# Patient Record
Sex: Male | Born: 1970 | Race: Black or African American | Hispanic: No | Marital: Single | Smoking: Never smoker
Health system: Southern US, Community
[De-identification: ages and names within clinical notes are randomized; demographics above are authoritative.]

## PROBLEM LIST (undated history)

## (undated) DIAGNOSIS — I214 Non-ST elevation (NSTEMI) myocardial infarction: Secondary | ICD-10-CM

## (undated) DIAGNOSIS — F319 Bipolar disorder, unspecified: Secondary | ICD-10-CM

## (undated) DIAGNOSIS — I82409 Acute embolism and thrombosis of unspecified deep veins of unspecified lower extremity: Secondary | ICD-10-CM

## (undated) DIAGNOSIS — F32A Depression, unspecified: Secondary | ICD-10-CM

## (undated) DIAGNOSIS — I10 Essential (primary) hypertension: Secondary | ICD-10-CM

## (undated) DIAGNOSIS — E7800 Pure hypercholesterolemia, unspecified: Secondary | ICD-10-CM

## (undated) DIAGNOSIS — F250 Schizoaffective disorder, bipolar type: Secondary | ICD-10-CM

## (undated) DIAGNOSIS — I2510 Atherosclerotic heart disease of native coronary artery without angina pectoris: Secondary | ICD-10-CM

## (undated) DIAGNOSIS — R011 Cardiac murmur, unspecified: Secondary | ICD-10-CM

## (undated) DIAGNOSIS — I219 Acute myocardial infarction, unspecified: Secondary | ICD-10-CM

## (undated) DIAGNOSIS — H109 Unspecified conjunctivitis: Secondary | ICD-10-CM

## (undated) DIAGNOSIS — M1990 Unspecified osteoarthritis, unspecified site: Secondary | ICD-10-CM

## (undated) DIAGNOSIS — B8789 Myiasis of other sites: Secondary | ICD-10-CM

## (undated) DIAGNOSIS — S86019A Strain of unspecified Achilles tendon, initial encounter: Secondary | ICD-10-CM

## (undated) DIAGNOSIS — F329 Major depressive disorder, single episode, unspecified: Secondary | ICD-10-CM

## (undated) DIAGNOSIS — F1410 Cocaine abuse, uncomplicated: Secondary | ICD-10-CM

## (undated) DIAGNOSIS — Z9861 Coronary angioplasty status: Secondary | ICD-10-CM

## (undated) DIAGNOSIS — R4182 Altered mental status, unspecified: Secondary | ICD-10-CM

## (undated) DIAGNOSIS — I509 Heart failure, unspecified: Secondary | ICD-10-CM

## (undated) DIAGNOSIS — M5430 Sciatica, unspecified side: Secondary | ICD-10-CM

---

## 1999-04-09 ENCOUNTER — Encounter: Admission: RE | Admit: 1999-04-09 | Discharge: 1999-04-09 | Payer: Self-pay | Admitting: Internal Medicine

## 1999-04-23 ENCOUNTER — Encounter: Admission: RE | Admit: 1999-04-23 | Discharge: 1999-04-23 | Payer: Self-pay | Admitting: Hematology and Oncology

## 1999-07-24 ENCOUNTER — Emergency Department (HOSPITAL_COMMUNITY): Admission: EM | Admit: 1999-07-24 | Discharge: 1999-07-24 | Payer: Self-pay | Admitting: Emergency Medicine

## 2000-03-07 ENCOUNTER — Emergency Department (HOSPITAL_COMMUNITY): Admission: EM | Admit: 2000-03-07 | Discharge: 2000-03-07 | Payer: Self-pay

## 2015-09-01 DIAGNOSIS — I82409 Acute embolism and thrombosis of unspecified deep veins of unspecified lower extremity: Secondary | ICD-10-CM

## 2015-09-01 HISTORY — DX: Acute embolism and thrombosis of unspecified deep veins of unspecified lower extremity: I82.409

## 2015-09-03 ENCOUNTER — Encounter (HOSPITAL_COMMUNITY): Payer: Self-pay | Admitting: *Deleted

## 2015-09-03 ENCOUNTER — Emergency Department (HOSPITAL_COMMUNITY)
Admission: EM | Admit: 2015-09-03 | Discharge: 2015-09-03 | Disposition: A | Discharge status: Home / Self Care | Payer: Self-pay | Attending: Dermatology | Admitting: Dermatology

## 2015-09-03 DIAGNOSIS — L0291 Cutaneous abscess, unspecified: Secondary | ICD-10-CM | POA: Insufficient documentation

## 2015-09-03 DIAGNOSIS — I10 Essential (primary) hypertension: Secondary | ICD-10-CM | POA: Insufficient documentation

## 2015-09-03 DIAGNOSIS — Z5321 Procedure and treatment not carried out due to patient leaving prior to being seen by health care provider: Secondary | ICD-10-CM | POA: Insufficient documentation

## 2015-09-03 HISTORY — DX: Essential (primary) hypertension: I10

## 2015-09-03 HISTORY — DX: Sciatica, unspecified side: M54.30

## 2015-09-03 HISTORY — DX: Unspecified osteoarthritis, unspecified site: M19.90

## 2015-09-03 NOTE — ED Triage Notes (Signed)
Pt states he is supposed to be taking blood pressure medicine but has been off of the medication for 3.5 years. States that he has been having vision issues since 2015 due to HTN. States he also wants to be seen for a boil he has near his groin.

## 2015-09-03 NOTE — ED Notes (Signed)
Patient and patients visitor visualized leaving ER.

## 2015-09-03 NOTE — ED Triage Notes (Signed)
Pt became upset during triage questions and stood up and removed himself from monitoring. When this RN went to get his temperature he stood up out of the chair and drank water. Pt states he came in to get a check up not get asked so many questions. Explained triage process.

## 2015-09-19 ENCOUNTER — Emergency Department (HOSPITAL_COMMUNITY): Payer: Self-pay | Admitting: Anesthesiology

## 2015-09-19 ENCOUNTER — Emergency Department (HOSPITAL_COMMUNITY): Payer: Self-pay

## 2015-09-19 ENCOUNTER — Encounter (HOSPITAL_COMMUNITY)
Admission: EM | Disposition: A | Discharge status: Other Acute Inpatient Hospital | Payer: Self-pay | Source: Home / Self Care | Attending: Orthopedic Surgery

## 2015-09-19 ENCOUNTER — Inpatient Hospital Stay (HOSPITAL_COMMUNITY)
Admission: EM | Admit: 2015-09-19 | Discharge: 2015-10-02 | DRG: 581 | Payer: Self-pay | Attending: Orthopedic Surgery | Admitting: Orthopedic Surgery

## 2015-09-19 ENCOUNTER — Encounter (HOSPITAL_COMMUNITY): Payer: Self-pay | Admitting: *Deleted

## 2015-09-19 DIAGNOSIS — I10 Essential (primary) hypertension: Secondary | ICD-10-CM | POA: Diagnosis present

## 2015-09-19 DIAGNOSIS — Z91013 Allergy to seafood: Secondary | ICD-10-CM

## 2015-09-19 DIAGNOSIS — S86019A Strain of unspecified Achilles tendon, initial encounter: Secondary | ICD-10-CM

## 2015-09-19 DIAGNOSIS — M1990 Unspecified osteoarthritis, unspecified site: Secondary | ICD-10-CM | POA: Diagnosis present

## 2015-09-19 DIAGNOSIS — R402252 Coma scale, best verbal response, oriented, at arrival to emergency department: Secondary | ICD-10-CM | POA: Diagnosis present

## 2015-09-19 DIAGNOSIS — Z590 Homelessness: Secondary | ICD-10-CM

## 2015-09-19 DIAGNOSIS — R402142 Coma scale, eyes open, spontaneous, at arrival to emergency department: Secondary | ICD-10-CM | POA: Diagnosis present

## 2015-09-19 DIAGNOSIS — S91011A Laceration without foreign body, right ankle, initial encounter: Principal | ICD-10-CM | POA: Diagnosis present

## 2015-09-19 DIAGNOSIS — S86021A Laceration of right Achilles tendon, initial encounter: Secondary | ICD-10-CM | POA: Diagnosis present

## 2015-09-19 DIAGNOSIS — IMO0002 Reserved for concepts with insufficient information to code with codable children: Secondary | ICD-10-CM

## 2015-09-19 DIAGNOSIS — W268XXA Contact with other sharp object(s), not elsewhere classified, initial encounter: Secondary | ICD-10-CM

## 2015-09-19 DIAGNOSIS — Y9259 Other trade areas as the place of occurrence of the external cause: Secondary | ICD-10-CM

## 2015-09-19 DIAGNOSIS — S86011A Strain of right Achilles tendon, initial encounter: Secondary | ICD-10-CM

## 2015-09-19 DIAGNOSIS — M5430 Sciatica, unspecified side: Secondary | ICD-10-CM | POA: Diagnosis present

## 2015-09-19 DIAGNOSIS — R402362 Coma scale, best motor response, obeys commands, at arrival to emergency department: Secondary | ICD-10-CM | POA: Diagnosis present

## 2015-09-19 DIAGNOSIS — Z791 Long term (current) use of non-steroidal anti-inflammatories (NSAID): Secondary | ICD-10-CM

## 2015-09-19 HISTORY — DX: Strain of unspecified achilles tendon, initial encounter: S86.019A

## 2015-09-19 HISTORY — PX: I & D EXTREMITY: SHX5045

## 2015-09-19 LAB — CBC WITH DIFFERENTIAL/PLATELET
BASOS ABS: 0 10*3/uL (ref 0.0–0.1)
BASOS PCT: 0 %
EOS PCT: 1 %
Eosinophils Absolute: 0.1 10*3/uL (ref 0.0–0.7)
HCT: 41 % (ref 39.0–52.0)
Hemoglobin: 13.4 g/dL (ref 13.0–17.0)
LYMPHS PCT: 20 %
Lymphs Abs: 1.6 10*3/uL (ref 0.7–4.0)
MCH: 29.1 pg (ref 26.0–34.0)
MCHC: 32.7 g/dL (ref 30.0–36.0)
MCV: 88.9 fL (ref 78.0–100.0)
Monocytes Absolute: 0.3 10*3/uL (ref 0.1–1.0)
Monocytes Relative: 4 %
NEUTROS ABS: 6.2 10*3/uL (ref 1.7–7.7)
Neutrophils Relative %: 75 %
Platelets: 269 10*3/uL (ref 150–400)
RBC: 4.61 MIL/uL (ref 4.22–5.81)
RDW: 14.2 % (ref 11.5–15.5)
WBC: 8.3 10*3/uL (ref 4.0–10.5)

## 2015-09-19 LAB — ETHANOL: Alcohol, Ethyl (B): <5 mg/dL (ref <5)

## 2015-09-19 LAB — BASIC METABOLIC PANEL
ANION GAP: 5 (ref 5–15)
BUN: 18 mg/dL (ref 6–20)
CALCIUM: 9.1 mg/dL (ref 8.9–10.3)
CHLORIDE: 106 mmol/L (ref 101–111)
CO2: 28 mmol/L (ref 22–32)
Creatinine, Ser: 1.3 mg/dL — ABNORMAL HIGH (ref 0.61–1.24)
GFR calc Af Amer: >60 mL/min (ref >60)
GLUCOSE: 120 mg/dL — AB (ref 65–99)
POTASSIUM: 3.6 mmol/L (ref 3.5–5.1)
Sodium: 139 mmol/L (ref 135–145)

## 2015-09-19 SURGERY — IRRIGATION AND DEBRIDEMENT EXTREMITY
Anesthesia: General | Site: Ankle | Laterality: Right

## 2015-09-19 MED ORDER — CEFAZOLIN SODIUM-DEXTROSE 2-4 GM/100ML-% IV SOLN
2.0000 g | Freq: Four times a day (QID) | INTRAVENOUS | Status: AC
  Administered 2015-09-19 – 2015-09-21 (×6): 2 g via INTRAVENOUS
  Filled 2015-09-19 (×6): qty 100

## 2015-09-19 MED ORDER — METOCLOPRAMIDE HCL 5 MG PO TABS: 5.0000 mg | ORAL_TABLET | Freq: Three times a day (TID) | ORAL | Status: DC | PRN

## 2015-09-19 MED ORDER — LACTATED RINGERS IV SOLN
INTRAVENOUS | Status: DC | PRN
  Administered 2015-09-19 (×2): via INTRAVENOUS

## 2015-09-19 MED ORDER — VANCOMYCIN HCL 500 MG IV SOLR
INTRAVENOUS | Status: DC | PRN
  Administered 2015-09-19: 500 mg via TOPICAL

## 2015-09-19 MED ORDER — 0.9 % SODIUM CHLORIDE (POUR BTL) OPTIME
TOPICAL | Status: DC | PRN
  Administered 2015-09-19: 1000 mL
  Administered 2015-09-19 (×2): 2000 mL

## 2015-09-19 MED ORDER — SUCCINYLCHOLINE CHLORIDE 200 MG/10ML IV SOSY
PREFILLED_SYRINGE | INTRAVENOUS | Status: AC
  Filled 2015-09-19: qty 30

## 2015-09-19 MED ORDER — METHOCARBAMOL 1000 MG/10ML IJ SOLN
500.0000 mg | Freq: Four times a day (QID) | INTRAVENOUS | Status: DC | PRN
  Filled 2015-09-19: qty 5

## 2015-09-19 MED ORDER — PROPOFOL 10 MG/ML IV BOLUS
INTRAVENOUS | Status: DC | PRN
  Administered 2015-09-19: 40 mg via INTRAVENOUS
  Administered 2015-09-19: 300 mg via INTRAVENOUS

## 2015-09-19 MED ORDER — OXYCODONE HCL 5 MG PO TABS
5.0000 mg | ORAL_TABLET | ORAL | Status: DC | PRN
  Administered 2015-09-19 – 2015-10-02 (×80): 10 mg via ORAL
  Filled 2015-09-19 (×82): qty 2

## 2015-09-19 MED ORDER — SODIUM CHLORIDE 0.9 % IJ SOLN
INTRAMUSCULAR | Status: AC
  Filled 2015-09-19: qty 20

## 2015-09-19 MED ORDER — PROPOFOL 10 MG/ML IV BOLUS
INTRAVENOUS | Status: AC
  Filled 2015-09-19: qty 20

## 2015-09-19 MED ORDER — CEFAZOLIN SODIUM 1 G IJ SOLR
INTRAMUSCULAR | Status: AC
  Filled 2015-09-19: qty 30

## 2015-09-19 MED ORDER — PROMETHAZINE HCL 25 MG/ML IJ SOLN: 6.2500 mg | INTRAMUSCULAR | Status: DC | PRN

## 2015-09-19 MED ORDER — PHENYLEPHRINE HCL 10 MG/ML IJ SOLN
INTRAMUSCULAR | Status: DC | PRN
  Administered 2015-09-19: 80 ug via INTRAVENOUS

## 2015-09-19 MED ORDER — HYDROMORPHONE HCL 1 MG/ML IJ SOLN
0.2500 mg | INTRAMUSCULAR | Status: DC | PRN
  Administered 2015-09-19 (×2): 0.5 mg via INTRAVENOUS

## 2015-09-19 MED ORDER — EPHEDRINE SULFATE 50 MG/ML IJ SOLN
INTRAMUSCULAR | Status: AC
  Filled 2015-09-19: qty 2

## 2015-09-19 MED ORDER — ONDANSETRON HCL 4 MG/2ML IJ SOLN
4.0000 mg | Freq: Four times a day (QID) | INTRAMUSCULAR | Status: DC | PRN
  Administered 2015-09-20: 4 mg via INTRAVENOUS
  Filled 2015-09-19: qty 2

## 2015-09-19 MED ORDER — PHENYLEPHRINE 40 MCG/ML (10ML) SYRINGE FOR IV PUSH (FOR BLOOD PRESSURE SUPPORT)
PREFILLED_SYRINGE | INTRAVENOUS | Status: AC
  Filled 2015-09-19: qty 10

## 2015-09-19 MED ORDER — METOCLOPRAMIDE HCL 5 MG/ML IJ SOLN: 5.0000 mg | Freq: Three times a day (TID) | INTRAMUSCULAR | Status: DC | PRN

## 2015-09-19 MED ORDER — TETANUS-DIPHTH-ACELL PERTUSSIS 5-2.5-18.5 LF-MCG/0.5 IM SUSP
0.5000 mL | Freq: Once | INTRAMUSCULAR | Status: AC
  Administered 2015-09-19: 0.5 mL via INTRAMUSCULAR
  Filled 2015-09-19: qty 0.5

## 2015-09-19 MED ORDER — ACETAMINOPHEN 650 MG RE SUPP: 650.0000 mg | Freq: Four times a day (QID) | RECTAL | Status: DC | PRN

## 2015-09-19 MED ORDER — ASPIRIN 325 MG PO TABS
325.0000 mg | ORAL_TABLET | Freq: Every day | ORAL | Status: DC
  Administered 2015-09-20 – 2015-09-21 (×2): 325 mg via ORAL
  Filled 2015-09-19 (×2): qty 1

## 2015-09-19 MED ORDER — POTASSIUM CHLORIDE IN NACL 20-0.9 MEQ/L-% IV SOLN
INTRAVENOUS | Status: AC
  Administered 2015-09-19: 22:00:00 via INTRAVENOUS
  Filled 2015-09-19 (×2): qty 1000

## 2015-09-19 MED ORDER — CEFAZOLIN SODIUM-DEXTROSE 2-3 GM-% IV SOLR
INTRAVENOUS | Status: DC | PRN
  Administered 2015-09-19: 2 g via INTRAVENOUS

## 2015-09-19 MED ORDER — METHOCARBAMOL 500 MG PO TABS
500.0000 mg | ORAL_TABLET | Freq: Four times a day (QID) | ORAL | Status: DC | PRN
  Administered 2015-09-19 – 2015-10-02 (×37): 500 mg via ORAL
  Filled 2015-09-19 (×39): qty 1

## 2015-09-19 MED ORDER — DICLOFENAC SODIUM 75 MG PO TBEC
75.0000 mg | DELAYED_RELEASE_TABLET | Freq: Two times a day (BID) | ORAL | Status: DC
  Administered 2015-09-20: 75 mg via ORAL
  Filled 2015-09-19 (×2): qty 1

## 2015-09-19 MED ORDER — LIDOCAINE 2% (20 MG/ML) 5 ML SYRINGE
INTRAMUSCULAR | Status: AC
  Filled 2015-09-19: qty 10

## 2015-09-19 MED ORDER — ROCURONIUM BROMIDE 10 MG/ML (PF) SYRINGE
PREFILLED_SYRINGE | INTRAVENOUS | Status: AC
  Filled 2015-09-19: qty 20

## 2015-09-19 MED ORDER — FENTANYL CITRATE (PF) 100 MCG/2ML IJ SOLN
INTRAMUSCULAR | Status: AC
  Filled 2015-09-19: qty 2

## 2015-09-19 MED ORDER — SUCCINYLCHOLINE CHLORIDE 20 MG/ML IJ SOLN
INTRAMUSCULAR | Status: DC | PRN
  Administered 2015-09-19: 120 mg via INTRAVENOUS

## 2015-09-19 MED ORDER — LIDOCAINE HCL (CARDIAC) 20 MG/ML IV SOLN
INTRAVENOUS | Status: DC | PRN
  Administered 2015-09-19: 100 mg via INTRATRACHEAL

## 2015-09-19 MED ORDER — ACETAMINOPHEN 325 MG PO TABS
650.0000 mg | ORAL_TABLET | Freq: Four times a day (QID) | ORAL | Status: DC | PRN
  Administered 2015-09-19 – 2015-10-01 (×10): 650 mg via ORAL
  Filled 2015-09-19 (×11): qty 2

## 2015-09-19 MED ORDER — DOCUSATE SODIUM 100 MG PO CAPS
100.0000 mg | ORAL_CAPSULE | Freq: Two times a day (BID) | ORAL | Status: DC
  Administered 2015-09-19 – 2015-10-01 (×4): 100 mg via ORAL
  Filled 2015-09-19 (×18): qty 1

## 2015-09-19 MED ORDER — ONDANSETRON HCL 4 MG/2ML IJ SOLN
INTRAMUSCULAR | Status: AC
  Filled 2015-09-19: qty 4

## 2015-09-19 MED ORDER — FENTANYL CITRATE (PF) 250 MCG/5ML IJ SOLN
INTRAMUSCULAR | Status: DC | PRN
  Administered 2015-09-19: 100 ug via INTRAVENOUS

## 2015-09-19 MED ORDER — ONDANSETRON HCL 4 MG PO TABS: 4.0000 mg | ORAL_TABLET | Freq: Four times a day (QID) | ORAL | Status: DC | PRN

## 2015-09-19 MED ORDER — EPHEDRINE SULFATE 50 MG/ML IJ SOLN
INTRAMUSCULAR | Status: DC | PRN
  Administered 2015-09-19 (×2): 10 mg via INTRAVENOUS

## 2015-09-19 MED ORDER — FENTANYL CITRATE (PF) 100 MCG/2ML IJ SOLN
50.0000 ug | Freq: Once | INTRAMUSCULAR | Status: AC
  Administered 2015-09-19: 50 ug via INTRAVENOUS
  Filled 2015-09-19: qty 2

## 2015-09-19 MED ORDER — MEPERIDINE HCL 25 MG/ML IJ SOLN: 6.2500 mg | INTRAMUSCULAR | Status: DC | PRN

## 2015-09-19 MED ORDER — HYDROMORPHONE HCL 1 MG/ML IJ SOLN
INTRAMUSCULAR | Status: AC
  Filled 2015-09-19: qty 1

## 2015-09-19 MED ORDER — SODIUM CHLORIDE 0.9 % IV BOLUS (SEPSIS)
500.0000 mL | Freq: Once | INTRAVENOUS | Status: AC
  Administered 2015-09-19: 500 mL via INTRAVENOUS

## 2015-09-19 MED ORDER — ARTIFICIAL TEARS OP OINT
TOPICAL_OINTMENT | OPHTHALMIC | Status: AC
  Filled 2015-09-19: qty 7

## 2015-09-19 MED ORDER — VANCOMYCIN HCL 500 MG IV SOLR
INTRAVENOUS | Status: AC
  Filled 2015-09-19: qty 500

## 2015-09-19 MED ORDER — CEFAZOLIN SODIUM-DEXTROSE 2-4 GM/100ML-% IV SOLN
2.0000 g | Freq: Once | INTRAVENOUS | Status: AC
  Administered 2015-09-19: 2 g via INTRAVENOUS
  Filled 2015-09-19: qty 100

## 2015-09-19 SURGICAL SUPPLY — 65 items
BANDAGE ACE 4X5 VEL STRL LF (GAUZE/BANDAGES/DRESSINGS) ×2 IMPLANT
BANDAGE ACE 6X5 VEL STRL LF (GAUZE/BANDAGES/DRESSINGS) ×2 IMPLANT
BANDAGE ELASTIC 4 VELCRO ST LF (GAUZE/BANDAGES/DRESSINGS) IMPLANT
BNDG COHESIVE 4X5 TAN STRL (GAUZE/BANDAGES/DRESSINGS) IMPLANT
BNDG GAUZE ELAST 4 BULKY (GAUZE/BANDAGES/DRESSINGS) ×2 IMPLANT
CANISTER WOUND CARE 500ML ATS (WOUND CARE) ×2 IMPLANT
COVER SURGICAL LIGHT HANDLE (MISCELLANEOUS) ×3 IMPLANT
CUFF TOURNIQUET SINGLE 18IN (TOURNIQUET CUFF) ×1 IMPLANT
CUFF TOURNIQUET SINGLE 24IN (TOURNIQUET CUFF) IMPLANT
CUFF TOURNIQUET SINGLE 34IN LL (TOURNIQUET CUFF) IMPLANT
CUFF TOURNIQUET SINGLE 44IN (TOURNIQUET CUFF) IMPLANT
DRAPE U-SHAPE 47X51 STRL (DRAPES) ×3 IMPLANT
DRSG PAD ABDOMINAL 8X10 ST (GAUZE/BANDAGES/DRESSINGS) IMPLANT
DRSG VAC ATS SM SENSATRAC (GAUZE/BANDAGES/DRESSINGS) ×2 IMPLANT
DURAPREP 26ML APPLICATOR (WOUND CARE) ×1 IMPLANT
ELECT REM PT RETURN 9FT ADLT (ELECTROSURGICAL) ×3
ELECTRODE REM PT RTRN 9FT ADLT (ELECTROSURGICAL) IMPLANT
FACESHIELD WRAPAROUND (MASK) ×3 IMPLANT
FACESHIELD WRAPAROUND OR TEAM (MASK) ×1 IMPLANT
GAUZE SPONGE 4X4 12PLY STRL (GAUZE/BANDAGES/DRESSINGS) ×2 IMPLANT
GAUZE XEROFORM 5X9 LF (GAUZE/BANDAGES/DRESSINGS) IMPLANT
GLOVE BIOGEL PI IND STRL 8 (GLOVE) ×1 IMPLANT
GLOVE BIOGEL PI INDICATOR 8 (GLOVE) ×2
GLOVE SURG ORTHO 8.0 STRL STRW (GLOVE) ×3 IMPLANT
GOWN STRL REUS W/ TWL LRG LVL3 (GOWN DISPOSABLE) ×2 IMPLANT
GOWN STRL REUS W/ TWL XL LVL3 (GOWN DISPOSABLE) ×1 IMPLANT
GOWN STRL REUS W/TWL LRG LVL3 (GOWN DISPOSABLE) ×6
GOWN STRL REUS W/TWL XL LVL3 (GOWN DISPOSABLE) ×3
HANDPIECE INTERPULSE COAX TIP (DISPOSABLE)
KIT BASIN OR (CUSTOM PROCEDURE TRAY) ×3 IMPLANT
KIT PREVENA INCISION MGT20CM45 (CANNISTER) ×2 IMPLANT
KIT ROOM TURNOVER OR (KITS) ×3 IMPLANT
MANIFOLD NEPTUNE II (INSTRUMENTS) ×3 IMPLANT
NDL SUT 6 .5 CRC .975X.05 MAYO (NEEDLE) IMPLANT
NEEDLE MAYO TAPER (NEEDLE) ×3
NS IRRIG 1000ML POUR BTL (IV SOLUTION) ×3 IMPLANT
PACK ORTHO EXTREMITY (CUSTOM PROCEDURE TRAY) ×3 IMPLANT
PAD ARMBOARD 7.5X6 YLW CONV (MISCELLANEOUS) ×6 IMPLANT
PAD CAST 4YDX4 CTTN HI CHSV (CAST SUPPLIES) IMPLANT
PADDING CAST COTTON 4X4 STRL (CAST SUPPLIES) ×3
PADDING CAST COTTON 6X4 STRL (CAST SUPPLIES) ×2 IMPLANT
SET HNDPC FAN SPRY TIP SCT (DISPOSABLE) IMPLANT
SPLINT PLASTER CAST XFAST 5X30 (CAST SUPPLIES) IMPLANT
SPLINT PLASTER XFAST SET 5X30 (CAST SUPPLIES) ×2
SPONGE LAP 18X18 X RAY DECT (DISPOSABLE) ×5 IMPLANT
SPONGE LAP 4X18 X RAY DECT (DISPOSABLE) ×1 IMPLANT
STOCKINETTE IMPERVIOUS 9X36 MD (GAUZE/BANDAGES/DRESSINGS) ×1 IMPLANT
SUT ETHILON 2 0 FS 18 (SUTURE) IMPLANT
SUT ETHILON 3 0 PS 1 (SUTURE) ×6 IMPLANT
SUT ETHILON 4 0 PS 2 18 (SUTURE) IMPLANT
SUT PROLENE 3 0 PS 2 (SUTURE) IMPLANT
SUT VIC AB 1 CT1 27 (SUTURE) ×12
SUT VIC AB 1 CT1 27XBRD ANBCTR (SUTURE) IMPLANT
SUT VIC AB 2-0 CT1 27 (SUTURE) ×6
SUT VIC AB 2-0 CT1 TAPERPNT 27 (SUTURE) IMPLANT
SUT VIC AB 3-0 SH 27 (SUTURE) ×3
SUT VIC AB 3-0 SH 27X BRD (SUTURE) IMPLANT
TOWEL OR 17X24 6PK STRL BLUE (TOWEL DISPOSABLE) ×3 IMPLANT
TOWEL OR 17X26 10 PK STRL BLUE (TOWEL DISPOSABLE) ×3 IMPLANT
TUBE ANAEROBIC SPECIMEN COL (MISCELLANEOUS) IMPLANT
TUBE CONNECTING 12'X1/4 (SUCTIONS) ×1
TUBE CONNECTING 12X1/4 (SUCTIONS) ×2 IMPLANT
UNDERPAD 30X30 INCONTINENT (UNDERPADS AND DIAPERS) ×3 IMPLANT
WATER STERILE IRR 1000ML POUR (IV SOLUTION) ×3 IMPLANT
YANKAUER SUCT BULB TIP NO VENT (SUCTIONS) ×3 IMPLANT

## 2015-09-19 NOTE — Anesthesia Preprocedure Evaluation (Addendum)
Anesthesia Evaluation  Patient identified by MRN, date of birth, ID band Patient awake    Reviewed: Allergy & Precautions, NPO status , Patient's Chart, lab work & pertinent test results  Airway Mallampati: II  TM Distance: >3 FB Neck ROM: Full    Dental no notable dental hx. (+) Dental Advisory Given, Edentulous Upper   Pulmonary neg pulmonary ROS,    Pulmonary exam normal breath sounds clear to auscultation       Cardiovascular hypertension, negative cardio ROS Normal cardiovascular exam Rhythm:Regular Rate:Normal     Neuro/Psych negative neurological ROS  negative psych ROS   GI/Hepatic negative GI ROS, Neg liver ROS,   Endo/Other  negative endocrine ROS  Renal/GU negative Renal ROS  negative genitourinary   Musculoskeletal negative musculoskeletal ROS (+) Arthritis ,   Abdominal   Peds negative pediatric ROS (+)  Hematology negative hematology ROS (+)   Anesthesia Other Findings   Reproductive/Obstetrics negative OB ROS                            Anesthesia Physical Anesthesia Plan  ASA: II  Anesthesia Plan: General   Post-op Pain Management:    Induction: Intravenous  Airway Management Planned: Oral ETT  Additional Equipment:   Intra-op Plan:   Post-operative Plan: Extubation in OR  Informed Consent: I have reviewed the patients History and Physical, chart, labs and discussed the procedure including the risks, benefits and alternatives for the proposed anesthesia with the patient or authorized representative who has indicated his/her understanding and acceptance.   Dental advisory given  Plan Discussed with: CRNA, Anesthesiologist and Surgeon  Anesthesia Plan Comments:        Anesthesia Quick Evaluation

## 2015-09-19 NOTE — Brief Op Note (Signed)
09/19/2015  7:19 PM  PATIENT:  Oren Bineterrick Schueler  45 y.o. male  PRE-OPERATIVE DIAGNOSIS:  ankle laceration right  POST-OPERATIVE DIAGNOSIS:  ankle laceration right  PROCEDURE:  Procedure(s): IRRIGATION AND DEBRIDEMENT ANKLE LACERATIONS achilles TENDON REPAIR, laceration closure with wound vac placement  SURGEON:  Surgeon(s): Cammy CopaScott Deshawn Skelley, MD  ASSISTANT: none  ANESTHESIA:   general  EBL: 10 ml    Total I/O In: 200 [I.V.:200] Out: -   BLOOD ADMINISTERED: none  DRAINS: provena incisional vac   LOCAL MEDICATIONS USED:  none  SPECIMEN:  No Specimen  COUNTS:  YES  TOURNIQUET:  * No tourniquets in log *  DICTATION: .Other Dictation: Dictation Number [id number]  PLAN OF CARE: Admit to inpatient   PATIENT DISPOSITION:  PACU - hemodynamically stable

## 2015-09-19 NOTE — Transfer of Care (Signed)
Immediate Anesthesia Transfer of Care Note  Patient: Oren BinetDerrick Croom  Procedure(s) Performed: Procedure(s): IRRIGATION AND DEBRIDEMENT ANKLE LACERATIONS POSSIBLE TENDON REPAIR (Right)  Patient Location: PACU  Anesthesia Type:General  Level of Consciousness: sedated  Airway & Oxygen Therapy: Patient Spontanous Breathing and Patient connected to face mask oxygen  Post-op Assessment: Report given to RN and Post -op Vital signs reviewed and stable  Post vital signs: Reviewed and stable  Last Vitals:  Vitals:   09/19/15 1123 09/19/15 1337  BP: 128/78 136/94  Pulse: 79 85  Resp: 17 18  Temp: 37.2 C     Last Pain:  Vitals:   09/19/15 1245  TempSrc:   PainSc: 6          Complications: No apparent anesthesia complications

## 2015-09-19 NOTE — ED Triage Notes (Addendum)
Per EMS - patient from [facility] is [facility], with right medial heal laceration.  Lac does not appear to be through to bone.  This happened last night around 7-8 pm when he put his foot in a sink to wash it and the porcelain sink collapsed.  Patient has 4 inch lac to medial right heal.  Bleeding is controlled.  Vitals 150/100 (hx HTN), HR 80, RR 20, 95% RA.  Patient appears exceptionally sleepy on arrival and states he was drinking "yesterday."

## 2015-09-19 NOTE — H&P (Signed)
Brett Salinas is an 45 y.o. male.   Chief Complaint: Right foot pain HPI: Brett Salinas is a 45 year old patient who just out of prison.  He was living in a hotel when yesterday a porcelain sink fell on his right heel.  Sustained a laceration at that time.  Did not immediately seek medical attention for unclear reasons.  Presents now with exposed tendon and inability to ambulate.  There is no family history of DVT or pulmonary embolism  Past Medical History:  Diagnosis Date  . Arthritis   . Hypertension   . Sciatica     History reviewed. No pertinent surgical history.  History reviewed. No pertinent family history. Social History:  reports that he has never smoked. He has never used smokeless tobacco. He reports that he drinks alcohol. He reports that he does not use drugs.  Allergies:  Allergies  Allergen Reactions  . Catfish [Fish Allergy] Anaphylaxis    Medications Prior to Admission  Medication Sig Dispense Refill  . diclofenac (VOLTAREN) 75 MG EC tablet Take 75 mg by mouth 2 (two) times daily.    . naproxen sodium (ANAPROX) 220 MG tablet Take 440 mg by mouth 2 (two) times daily with a meal.      Results for orders placed or performed during the hospital encounter of 09/19/15 (from the past 48 hour(s))  Ethanol     Status: None   Collection Time: 09/19/15 12:16 PM  Result Value Ref Range   Alcohol, Ethyl (B) <5 <5 mg/dL    Comment:        LOWEST DETECTABLE LIMIT FOR SERUM ALCOHOL IS 5 mg/dL FOR MEDICAL PURPOSES ONLY   Basic metabolic panel     Status: Abnormal   Collection Time: 09/19/15 12:16 PM  Result Value Ref Range   Sodium 139 135 - 145 mmol/L   Potassium 3.6 3.5 - 5.1 mmol/L   Chloride 106 101 - 111 mmol/L   CO2 28 22 - 32 mmol/L   Glucose, Bld 120 (H) 65 - 99 mg/dL   BUN 18 6 - 20 mg/dL   Creatinine, Ser 1.30 (H) 0.61 - 1.24 mg/dL   Calcium 9.1 8.9 - 10.3 mg/dL   GFR calc non Af Amer >60 >60 mL/min   GFR calc Af Amer >60 >60 mL/min    Comment:  (NOTE) The eGFR has been calculated using the CKD EPI equation. This calculation has not been validated in all clinical situations. eGFR's persistently <60 mL/min signify possible Chronic Kidney Disease.    Anion gap 5 5 - 15  CBC with Differential     Status: None   Collection Time: 09/19/15 12:16 PM  Result Value Ref Range   WBC 8.3 4.0 - 10.5 K/uL   RBC 4.61 4.22 - 5.81 MIL/uL   Hemoglobin 13.4 13.0 - 17.0 g/dL   HCT 41.0 39.0 - 52.0 %   MCV 88.9 78.0 - 100.0 fL   MCH 29.1 26.0 - 34.0 pg   MCHC 32.7 30.0 - 36.0 g/dL   RDW 14.2 11.5 - 15.5 %   Platelets 269 150 - 400 K/uL   Neutrophils Relative % 75 %   Neutro Abs 6.2 1.7 - 7.7 K/uL   Lymphocytes Relative 20 %   Lymphs Abs 1.6 0.7 - 4.0 K/uL   Monocytes Relative 4 %   Monocytes Absolute 0.3 0.1 - 1.0 K/uL   Eosinophils Relative 1 %   Eosinophils Absolute 0.1 0.0 - 0.7 K/uL   Basophils Relative 0 %  Basophils Absolute 0.0 0.0 - 0.1 K/uL   Dg Chest 2 View  Result Date: 09/19/2015 CLINICAL DATA:  Preoperative evaluation for ankle region surgery. Hypertension. EXAM: CHEST  2 VIEW COMPARISON:  None. FINDINGS: Lungs are clear. Heart size and pulmonary vascularity are normal. No adenopathy. No bone lesions. IMPRESSION: No edema or consolidation. Electronically Signed   By: Lowella Grip III M.D.   On: 09/19/2015 14:37   Dg Ankle Complete Right  Result Date: 09/19/2015 CLINICAL DATA:  Laceration posterior ankle region EXAM: RIGHT ANKLE - COMPLETE 3+ VIEW COMPARISON:  None. FINDINGS: Frontal, oblique, and lateral views were obtained. There is extensive soft tissue injury posterior to the ankle joint region. There is no acute fracture or joint effusion. The ankle mortise appears intact. Calcification anterior to the dorsal distal talus may represent residua of previous trauma. There is no appreciable joint space narrowing or erosion. IMPRESSION: Extensive soft tissue injury posterior to the ankle joint without bony abnormality. No  acute fracture or joint effusion. Question residua of old trauma adjacent to the distal talus dorsally. Electronically Signed   By: Lowella Grip III M.D.   On: 09/19/2015 11:53    Review of Systems  Musculoskeletal: Positive for joint pain.  All other systems reviewed and are negative.   Blood pressure 136/94, pulse 85, temperature 98.9 F (37.2 C), temperature source Oral, resp. rate 18, SpO2 100 %. Physical Exam  Constitutional: He appears well-developed.  HENT:  Head: Normocephalic.  Eyes: Pupils are equal, round, and reactive to light.  Neck: Normal range of motion.  Cardiovascular: Normal rate.   Respiratory: Effort normal.  Neurological: He is alert.  Skin: Skin is warm.  Psychiatric: He has a normal mood and affect.   examination the right leg demonstrates intact pedal pulses pes planus intact sensation on the dorsal plantar aspect of the foot he has a flap type laceration with exposed Achilles tendon.  Laceration measures about 5 cm and goes axially across the tendon.  There is no gross contamination.  He has no plantarflexion strength.   Assessment/Plan Impression is right ankle posterior laceration with Achilles tendon laceration and high likelihood of wound healing problems and high potential for infection plan exploration with excisional debridement primary Achilles tendon repair closure with additional vancomycin powder added to the wound with probing a wound VAC utilized as well chance of repair failure as well as infection discussed.  Viability of this posterior skin flap his questionable.  He will need to stay in the hospital for at least 48 hours for a longer course of postop IV antibiotics.  Patient understands the risks and benefits and wishes to proceed all questions answered  Meredith Pel, MD 09/19/2015, 4:51 PM

## 2015-09-19 NOTE — ED Provider Notes (Signed)
[facility]-EMERGENCY DEPT Provider Note   CSN: [id number] Arrival date & time: 09/19/15  1103     History   Chief Complaint Chief Complaint  Patient presents with  . Laceration    HPI Brett Salinas is a 45 y.o. male.  Brett Salinas is a 45 y.o. male with history of arthritis, hypertension, sciatica presents to ED via EMS with complaint of laceration to ankle. Patient states he is trying to wash his feet and a porcelain sink at his hotel last night when his foot went through the sink creating a deep laceration in his right ankle. Laceration occurred at 7 PM. No treatments applied. No history of immunocompromising conditions. Tetanus is greater than 10 years. Patient states he is unable to walk on it secondary to pain. Denies fever or numbness. He has chronic arthralgias secondary to arthritis. Complains of dry mouth. No other complaints.      Past Medical History:  Diagnosis Date  . Arthritis   . Hypertension   . Sciatica     There are no active problems to display for this patient.   History reviewed. No pertinent surgical history.     Home Medications    Prior to Admission medications   Medication Sig Start Date End Date Taking? Authorizing Provider  diclofenac (VOLTAREN) 75 MG EC tablet Take 75 mg by mouth 2 (two) times daily.   Yes Historical Provider, MD  naproxen sodium (ANAPROX) 220 MG tablet Take 440 mg by mouth 2 (two) times daily with a meal.   Yes Historical Provider, MD    Family History History reviewed. No pertinent family history.  Social History Social History  Substance Use Topics  . Smoking status: Never Smoker  . Smokeless tobacco: Never Used  . Alcohol use Yes     Comment: occassional     Allergies   Catfish [fish allergy]   Review of Systems Review of Systems  Constitutional: Negative for fever.  HENT: Negative for sore throat.        Complains of dry mouth  Eyes: Negative for discharge.  Respiratory: Negative for shortness of breath.    Cardiovascular: Negative for chest pain.  Gastrointestinal: Negative for abdominal pain, nausea and vomiting.  Genitourinary: Negative for dysuria and hematuria.  Musculoskeletal: Positive for arthralgias ( h/o arthritis) and myalgias.  Skin: Positive for wound.  Neurological: Negative for syncope and numbness.     Physical Exam Updated Vital Signs BP 128/78 (BP Location: Left Arm)   Pulse 79   Temp 98.9 F (37.2 C) (Oral)   Resp 17   SpO2 99%   Physical Exam  Constitutional: He appears well-developed and well-nourished. No distress.  HENT:  Head: Normocephalic and atraumatic.  Mouth/Throat: Oropharynx is clear and moist. No oropharyngeal exudate.  Eyes: Conjunctivae and EOM are normal. Pupils are equal, round, and reactive to light. Right eye exhibits no discharge. Left eye exhibits no discharge. No scleral icterus.  Neck: Normal range of motion. Neck supple.  Cardiovascular: Normal rate, regular rhythm, normal heart sounds and intact distal pulses.   No murmur heard. Pulmonary/Chest: Effort normal and breath sounds normal. No respiratory distress.  Abdominal: Soft. Bowel sounds are normal. There is no tenderness. There is no rebound and no guarding.  Musculoskeletal: Normal range of motion.  Achilles tendon exposed with obvious rupture. Abnormal thompson test.   Lymphadenopathy:    He has no cervical adenopathy.  Neurological: He is alert. He is not disoriented. Coordination normal. GCS eye subscore is 4. GCS verbal subscore  is 5. GCS motor subscore is 6.  Skin: Skin is warm and dry. He is not diaphoretic.     Psychiatric: He has a normal mood and affect.     ED Treatments / Results  Labs (all labs ordered are listed, but only abnormal results are displayed) Labs Reviewed  BASIC METABOLIC PANEL - Abnormal; Notable for the following:       Result Value   Glucose, Bld 120 (*)    Creatinine, Ser 1.30 (*)    All other components within normal limits  ETHANOL  CBC  WITH DIFFERENTIAL/PLATELET    EKG  EKG Interpretation None       Radiology Dg Chest 2 View  Result Date: 09/19/2015 CLINICAL DATA:  Preoperative evaluation for ankle region surgery. Hypertension. EXAM: CHEST  2 VIEW COMPARISON:  None. FINDINGS: Lungs are clear. Heart size and pulmonary vascularity are normal. No adenopathy. No bone lesions. IMPRESSION: No edema or consolidation. Electronically Signed   By: Bretta BangWilliam  Woodruff III M.D.   On: 09/19/2015 14:37   Dg Ankle Complete Right  Result Date: 09/19/2015 CLINICAL DATA:  Laceration posterior ankle region EXAM: RIGHT ANKLE - COMPLETE 3+ VIEW COMPARISON:  None. FINDINGS: Frontal, oblique, and lateral views were obtained. There is extensive soft tissue injury posterior to the ankle joint region. There is no acute fracture or joint effusion. The ankle mortise appears intact. Calcification anterior to the dorsal distal talus may represent residua of previous trauma. There is no appreciable joint space narrowing or erosion. IMPRESSION: Extensive soft tissue injury posterior to the ankle joint without bony abnormality. No acute fracture or joint effusion. Question residua of old trauma adjacent to the distal talus dorsally. Electronically Signed   By: Bretta BangWilliam  Woodruff III M.D.   On: 09/19/2015 11:53    Procedures Procedures (including critical care time)  Medications Ordered in ED Medications  Tdap (BOOSTRIX) injection 0.5 mL (0.5 mLs Intramuscular Given 09/19/15 1217)  sodium chloride 0.9 % bolus 500 mL (0 mLs Intravenous Stopped 09/19/15 1416)  fentaNYL (SUBLIMAZE) injection 50 mcg (50 mcg Intravenous Given 09/19/15 1215)  ceFAZolin (ANCEF) IVPB 2g/100 mL premix (0 g Intravenous Stopped 09/19/15 1505)     Initial Impression / Assessment and Plan / ED Course  I have reviewed the triage vital signs and the nursing notes.  Pertinent labs & imaging results that were available during my care of the patient were reviewed by me and considered in  my medical decision making (see chart for details).       Clinical Course  Value Comment By Time  DG Chest 2 View Normal cardiac silhouette. No evidence of consolidation, effusion, or PTX.  Lona Kettleshley Laurel Belma Dyches, [place] 08/19 1504  EKG 12-Lead Sinus rhythm. Normal intervals. No axis deviation. Concern for LVH.  Lona Kettleshley Laurel Shivon Hackel, [place] 08/19 1505    Patient is afebrile and non-toxic appearing in NAD. Vital signs are stable. Physical exam remarkable for 4in laceration with exposed achilles tendon, obvious tendon rupture. DP/ PT pulses intact. Sensation intact. Abnormal thompson test. X-ray negative for bony involvement. Tetanus UTD. Given length of time since wound incident and complexity of wound, consider OR wash out and repair of achilles tendon.  Will consult orthopedics for further management. Patient denies eating anything today. He had water in ED at 11am.   1:39 PM: Spoke with Dr. August Saucerean of orthopedics, greatly appreciated his time and input. NPO. Complete pre-op labs, EKG, and CXR. Recommend covering wound, [facility]  CBC re-assuring. IV  ABX initiated. Etoh normal. BMP remarkable for mild elevated of creatine, no previous labs to compare - ?secondary to untreated HTN vs. ?dehydration. IVF initiated. CXR negative for acute cardiopulmonary process. EKG sinus rhythm with possible LVH.  Final Clinical Impressions(s) / ED Diagnoses   Final diagnoses:  Laceration  Achilles tendon rupture, right, initial encounter    New Prescriptions Current Discharge Medication List       Lona Kettleshley Laurel Tedi Hughson, PA-C 09/19/15 1626    Charlynne Panderavid Hsienta Yao, MD 09/19/15 [phone]

## 2015-09-19 NOTE — ED Notes (Signed)
Pt refusing to get off phone to RN can complete EKG and wrap ankle at this time.

## 2015-09-19 NOTE — Progress Notes (Signed)
Social work contacted for resources. Patient reports being homeless.

## 2015-09-19 NOTE — Anesthesia Procedure Notes (Signed)
Procedure Name: Intubation Date/Time: 09/19/2015 5:36 PM Performed by: Brien MatesMAHONY, Nikia Mangino D Pre-anesthesia Checklist: Patient identified, Emergency Drugs available, Suction available, Patient being monitored and Timeout performed Patient Re-evaluated:Patient Re-evaluated prior to inductionOxygen Delivery Method: Circle system utilized Preoxygenation: Pre-oxygenation with 100% oxygen Intubation Type: IV induction Ventilation: Mask ventilation without difficulty Laryngoscope Size: Miller and 2 Grade View: Grade I Tube type: Oral Tube size: 7.5 mm Number of attempts: 1 Airway Equipment and Method: Stylet Placement Confirmation: ETT inserted through vocal cords under direct vision,  positive ETCO2 and breath sounds checked- equal and bilateral Secured at: 23 cm Tube secured with: Tape Dental Injury: Teeth and Oropharynx as per pre-operative assessment

## 2015-09-19 NOTE — ED Notes (Signed)
Pt refusing bedrails up.

## 2015-09-19 NOTE — Progress Notes (Signed)
Patient denies having money or other valuables except cell phone.

## 2015-09-19 NOTE — Op Note (Signed)
[name]:  [name], [name]               ACCOUNT NO.:  [id number]  MEDICAL RECORD NO.:  [id number]  LOCATION:  [id number]                        FACILITY:  [facility]  PHYSICIAN:  [name], M.D.    DATE OF BIRTH:  [birth year]  DATE OF PROCEDURE:  09/19/2015 DATE OF DISCHARGE:                              OPERATIVE REPORT   PREOPERATIVE DIAGNOSIS:  Complex laceration right heel with Achilles tendon rupture laceration.  POSTOPERATIVE DIAGNOSIS:  Complex laceration right heel with Achilles tendon rupture laceration.  PROCEDURE: 1. Excisional debridement of complex laceration which has cut the     Achilles tendon. 2. Direct Achilles tendon repair. 3. Repair of laceration measuring approximately 6 cm on the posterior     aspect of the heel.  SURGEON:  [name], M.D.  ASSISTANT:  None.  ANESTHESIA:  General.  INDICATIONS:  Brett Salinas is a 45 year old patient who cut his posterior heel yesterday.  Came to the hospital today for management.  PROCEDURE IN DETAIL:  The patient was brought to the operating room where general anesthetic was induced.  Preoperative IV antibiotics were administered.  Time-out was called.  The patient was placed prone with the right leg prepped and draped freely.  All bony prominences were well- padded.  The right leg was then scrubbed and prepped with Hibiclens and saline and draped in a sterile manner.  Time-out was called.  Incisional debridement was first performed.  The complex laceration was extended proximally and then medially.  This was extended about 4-5 cm in order to allow for development of a flap which could be pulled laterally in order to expose, somewhat retracted end of the Achilles tendon.  Careful dissection was performed maintaining full thickness skin flaps particularly the flap that covered the tendon.  Thorough irrigation was then performed along with excisional debridement with a curette.  A total of 6 L of irrigating solution was  utilized.  Following this, #1 Vicryl suture was placed.  2 locking sutures placed in the proximal strand and 2 locking sutures placed in the distal strands.  In this case, 4 strands in each stump to cross the repair site.  The tendon was then repaired end-to-end with appropriate tension.  Sutures were tied. Thorough irrigation was performed and some vancomycin powder was placed within the paratenon.  Paratenon was then closed over the tendon repair and additional vancomycin powder was placed.  The skin was then reapproximated using one 2-0 Vicryl where the extension of the incision was performed.  Rest of the skin was closed using 3-0 nylon simple sutures.  Good stable appropriately tension repair was achieved.  Prevena wound VAC was placed.  The splint was then placed with the foot in plantar flexion. The patient tolerated the procedure well without immediate complication. Transferred to the recovery room in stable condition.     [name], M.D.     GSD/MEDQ  D:  09/19/2015  T:  09/19/2015  Job:  [id number]

## 2015-09-20 MED ORDER — NAPROXEN 250 MG PO TABS
500.0000 mg | ORAL_TABLET | Freq: Two times a day (BID) | ORAL | Status: DC | PRN
  Administered 2015-09-21 – 2015-10-01 (×4): 500 mg via ORAL
  Filled 2015-09-20 (×4): qty 2

## 2015-09-20 MED ORDER — MAGNESIUM CITRATE PO SOLN
1.0000 | Freq: Once | ORAL | Status: AC
  Administered 2015-09-20: 1 via ORAL
  Filled 2015-09-20: qty 296

## 2015-09-20 MED ORDER — MORPHINE SULFATE (PF) 2 MG/ML IV SOLN
2.0000 mg | INTRAVENOUS | Status: DC | PRN
  Administered 2015-09-20 – 2015-09-23 (×8): 2 mg via INTRAVENOUS
  Filled 2015-09-20 (×9): qty 1

## 2015-09-20 MED ORDER — SALINE SPRAY 0.65 % NA SOLN
1.0000 | NASAL | Status: DC | PRN
  Administered 2015-09-20: 1 via NASAL
  Filled 2015-09-20 (×2): qty 44

## 2015-09-20 MED ORDER — RIVAROXABAN 10 MG PO TABS
10.0000 mg | ORAL_TABLET | Freq: Every day | ORAL | Status: DC
  Administered 2015-09-20 – 2015-10-01 (×12): 10 mg via ORAL
  Filled 2015-09-20 (×12): qty 1

## 2015-09-20 NOTE — Progress Notes (Signed)
Subjective: Patient stable pain reasonably well-controlled wound VAC is in place and functional   Objective: Vital signs in last 24 hours: Temp:  [97.3 F (36.3 C)-98.9 F (37.2 C)] 97.7 F (36.5 C) (08/20 0640) Pulse Rate:  [70-85] 70 (08/20 0640) Resp:  [11-22] 18 (08/20 0640) BP: (125-147)/(75-106) 125/75 (08/20 0640) SpO2:  [97 %-100 %] 98 % (08/20 0640) Weight:  [113 kg (249 lb 1.9 oz)] 113 kg (249 lb 1.9 oz) (08/19 2053)  Intake/Output from previous day: 08/19 0701 - 08/20 0700 In: 2460 [P.O.:760; I.V.:1700] Out: 150 [Urine:100; Blood:50] Intake/Output this shift: No intake/output data recorded.  Exam:  Dorsiflexion/Plantar flexion intact Compartment soft  Labs:  Recent Labs  09/19/15 1216  HGB 13.4    Recent Labs  09/19/15 1216  WBC 8.3  RBC 4.61  HCT 41.0  PLT 269    Recent Labs  09/19/15 1216  NA 139  K 3.6  CL 106  CO2 28  BUN 18  CREATININE 1.30*  GLUCOSE 120*  CALCIUM 9.1   No results for input(s): LABPT, INR in the last 72 hours.  Assessment/Plan: Patient will need this incisional VAC for 1 week.  He will need rehabilitation.  IV antibiotics to expire tomorrow.  Nonweightbearing required.  Okay for rehabilitation discharge tomorrow if bed available.will place on xarelto for dvt prophylaxis since a period of nwb will be required   Brett Salinas SCOTT 09/20/2015, 9:29 AM

## 2015-09-20 NOTE — Evaluation (Signed)
Physical Therapy Evaluation Patient Details Name: Brett Salinas MRN: [id number] DOB: 09/12/1970 Today's Date: 09/20/2015   History of Present Illness  pt presents after sustaining a R Achilles Laceration and Rupture, now s/p Repair.  pt with hx of HTN.    Clinical Impression  Pt very motivated to improve mobility and return to PLOF, but difficult social situation and lack of family supports.  Feel pt would benefit from continued therapies to maximize independence and feel pt would excel at CIR level of therapies.  Will continue to follow to progress mobility and A with D/C planning.      Follow Up Recommendations CIR    Equipment Recommendations  Rolling walker with 5" wheels;3in1 (PT)    Recommendations for Other Services Rehab consult     Precautions / Restrictions Precautions Precautions: Fall Precaution Comments: Wound Vac to R LE Restrictions Weight Bearing Restrictions: Yes RLE Weight Bearing: Non weight bearing      Mobility  Bed Mobility Overal bed mobility: Modified Independent                Transfers Overall transfer level: Needs assistance Equipment used: Rolling walker (2 wheeled) Transfers: Sit to/from Stand Sit to Stand: Min assist         General transfer comment: cues for UE use and positioning of R LE.    Ambulation/Gait Ambulation/Gait assistance: Min guard Ambulation Distance (Feet): 25 Feet Assistive device: Rolling walker (2 wheeled) Gait Pattern/deviations: Step-to pattern     General Gait Details: cues for maintainging NWBing, use of RW, and encouragement.    Stairs            Wheelchair Mobility    Modified Rankin (Stroke Patients Only)       Balance Overall balance assessment: Needs assistance Sitting-balance support: No upper extremity supported;Feet supported Sitting balance-Leahy Scale: Good     Standing balance support: Bilateral upper extremity supported;During functional activity Standing balance-Leahy  Scale: Poor                               Pertinent Vitals/Pain Pain Assessment: 0-10 Pain Score: 6  Pain Location: R LE after mobility. Pain Descriptors / Indicators: Aching;Grimacing;Guarding Pain Intervention(s): Monitored during session;Repositioned;Premedicated before session;Patient requesting pain meds-RN notified    Home Living Family/patient expects to be discharged to:: Unsure                 Additional Comments: pt had been staying at a hotel.      Prior Function Level of Independence: Independent               Hand Dominance        Extremity/Trunk Assessment   Upper Extremity Assessment: Overall WFL for tasks assessed           Lower Extremity Assessment: RLE deficits/detail RLE Deficits / Details: WFL except where splinted at ankle/foot.  Sensation intact throughout.      Cervical / Trunk Assessment: Normal  Communication   Communication: No difficulties  Cognition Arousal/Alertness: Awake/alert Behavior During Therapy: WFL for tasks assessed/performed Overall Cognitive Status: Within Functional Limits for tasks assessed                      General Comments      Exercises        Assessment/Plan    PT Assessment Patient needs continued PT services  PT Diagnosis Difficulty walking;Acute pain   PT Problem  List Decreased strength;Decreased activity tolerance;Decreased balance;Decreased mobility;Decreased coordination;Decreased knowledge of use of DME  PT Treatment Interventions DME instruction;Gait training;Stair training;Functional mobility training;Therapeutic activities;Therapeutic exercise;Balance training;Patient/family education   PT Goals (Current goals can be found in the Care Plan section) Acute Rehab PT Goals Patient Stated Goal: Heal. PT Goal Formulation: With patient Time For Goal Achievement: 10/04/15 Potential to Achieve Goals: Good    Frequency Min 3X/week   Barriers to discharge Other  (comment) pt had been living in a hotel PTA.    Co-evaluation               End of Session Equipment Utilized During Treatment: Gait belt Activity Tolerance: Patient tolerated treatment well Patient left: in chair;with call bell/phone within reach Nurse Communication: Mobility status         Time: [id number] PT Time Calculation (min) (ACUTE ONLY): 32 min   Charges:   PT Evaluation $PT Eval Moderate Complexity: 1 Procedure PT Treatments $Gait Training: 8-22 mins   PT G CodesSunny Schlein:        Sharaine Delange F, [place] CarolinaPT [phone] 09/20/2015, 1:20 PM
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
## 2015-09-21 ENCOUNTER — Encounter (HOSPITAL_COMMUNITY): Payer: Self-pay | Admitting: Orthopedic Surgery

## 2015-09-21 NOTE — [place] FL2 (Signed)
[place] MEDICAID FL2 LEVEL OF CARE SCREENING TOOL     IDENTIFICATION  Patient Name: Brett Salinas Birthdate: 02/18/1970 Sex: male Admission Date (Current Location): 09/19/2015  [facility] [place]   Facility and Address:  [facility], [address], [place], [place] [address]      Provider Number: [id number]  Attending Physician Name and Address:  Cammy CopaScott Tyreon Frigon, MD  Relative Name and Phone Number:       Current Level of Care: Hospital Recommended Level of Care: Skilled Nursing Facility Prior Approval Number:    Date Approved/Denied:   PASRR Number: [id number]  Discharge Plan: SNF    Current Diagnoses: Patient Active Problem List   Diagnosis Date Noted  . Achilles tendon rupture 09/19/2015    Orientation RESPIRATION BLADDER Height & Weight     Time, Self, Situation, Place  Normal Continent Weight: 113 kg (249 lb 1.9 oz) Height:  6' (182.9 cm)  BEHAVIORAL SYMPTOMS/MOOD NEUROLOGICAL BOWEL NUTRITION STATUS      Continent Diet (Please see DC Summary)  AMBULATORY STATUS COMMUNICATION OF NEEDS Skin   Supervision Verbally Other (Comment)                       Personal Care Assistance Level of Assistance  Bathing, Feeding, Dressing Bathing Assistance: Independent Feeding assistance: Independent Dressing Assistance: Independent     Functional Limitations Info             SPECIAL CARE FACTORS FREQUENCY  PT (By licensed PT)     PT Frequency: min 3x/week              Contractures      Additional Factors Info  Code Status, Allergies Code Status Info: Full Allergies Info: Catfish Fish Allergy           Current Medications (09/21/2015):  This is the current hospital active medication list Current Facility-Administered Medications  Medication Dose Route Frequency Provider Last Rate Last Dose  . acetaminophen (TYLENOL) tablet 650 mg  650 mg Oral Q6H PRN Cammy CopaScott Lilia Letterman, MD   650 mg at  09/21/15 0136   Or  . acetaminophen (TYLENOL) suppository 650 mg  650 mg Rectal Q6H PRN Cammy CopaScott Shanda Cadotte, MD      . docusate sodium (COLACE) capsule 100 mg  100 mg Oral BID Cammy CopaScott Lamonica Trueba, MD   100 mg at 09/20/15 0943  . methocarbamol (ROBAXIN) tablet 500 mg  500 mg Oral Q6H PRN Cammy CopaScott Ritu Gagliardo, MD   500 mg at 09/21/15 1522   Or  . methocarbamol (ROBAXIN) 500 mg in dextrose 5 % 50 mL IVPB  500 mg Intravenous Q6H PRN Cammy CopaScott Joenathan Sakuma, MD      . metoCLOPramide (REGLAN) tablet 5-10 mg  5-10 mg Oral Q8H PRN Cammy CopaScott Cheyan Frees, MD       Or  . metoCLOPramide (REGLAN) injection 5-10 mg  5-10 mg Intravenous Q8H PRN Cammy CopaScott Arizona Nordquist, MD      . morphine 2 MG/ML injection 2-4 mg  2-4 mg Intravenous Q4H PRN Cammy CopaScott Bee Marchiano, MD   2 mg at 09/21/15 [id number]  . naproxen (NAPROSYN) tablet 500 mg  500 mg Oral BID PRN Cammy CopaScott Adisynn Suleiman, MD   500 mg at 09/21/15 0249  . ondansetron (ZOFRAN) tablet 4 mg  4 mg Oral Q6H PRN Cammy CopaScott Jamari Diana, MD       Or  . ondansetron [facility](ZOFRAN) injection 4 mg  4 mg Intravenous  Q6H PRN Cammy CopaScott Tamula Morrical, MD   4 mg at 09/20/15 1142  . oxyCODONE (Oxy IR/ROXICODONE) immediate release tablet 5-10 mg  5-10 mg Oral Q3H PRN Cammy CopaScott Siniya Lichty, MD   10 mg at 09/21/15 1522  . rivaroxaban (XARELTO) tablet 10 mg  10 mg Oral Daily Cammy CopaScott Treyshon Buchanon, MD   10 mg at 09/21/15 0743  . sodium chloride (OCEAN) 0.65 % nasal spray 1 spray  1 spray Each Nare PRN Cammy CopaScott Tania Steinhauser, MD   1 spray at 09/20/15 1803     Discharge Medications: Please see discharge summary for a list of discharge medications.  Relevant Imaging Results:  Relevant Lab Results:   Additional Information SSN: [id number]  Nadia S MantenoRayyan, [place]

## 2015-09-21 NOTE — Progress Notes (Signed)
Subjective: Pt stable - pain ok   Objective: Vital signs in last 24 hours: Temp:  [97.8 F (36.6 C)] 97.8 F (36.6 C) (08/21 0347) Pulse Rate:  [70-74] 74 (08/21 0347) Resp:  [18] 18 (08/21 0347) BP: (127-132)/(80-82) 127/80 (08/21 0347) SpO2:  [99 %-100 %] 100 % (08/21 0347)  Intake/Output from previous day: 08/20 0701 - 08/21 0700 In: 1040 [P.O.:740; IV Piggyback:300] Out: 1450 [Urine:1450] Intake/Output this shift: No intake/output data recorded.  Exam:  Sensation intact distally Intact pulses distally  Labs:  Recent Labs  09/19/15 1216  HGB 13.4    Recent Labs  09/19/15 1216  WBC 8.3  RBC 4.61  HCT 41.0  PLT 269    Recent Labs  09/19/15 1216  NA 139  K 3.6  CL 106  CO2 28  BUN 18  CREATININE 1.30*  GLUCOSE 120*  CALCIUM 9.1   No results for input(s): LABPT, INR in the last 72 hours.  Assessment/Plan: Ready for snf will need wound vac   DEAN,GREGORY SCOTT 09/21/2015, 9:35 AM

## 2015-09-21 NOTE — Anesthesia Postprocedure Evaluation (Signed)
Anesthesia Post Note  Patient: Brett BinetDerrick Salinas  Procedure(s) Performed: Procedure(s) (LRB): IRRIGATION AND DEBRIDEMENT ANKLE LACERATIONS POSSIBLE TENDON REPAIR (Right)  Patient location during evaluation: PACU Anesthesia Type: General Level of consciousness: sedated and patient cooperative Pain management: pain level controlled Vital Signs Assessment: post-procedure vital signs reviewed and stable Respiratory status: spontaneous breathing Cardiovascular status: stable Anesthetic complications: no    Last Vitals:  Vitals:   09/20/15 1939 09/21/15 0347  BP: 129/80 127/80  Pulse: 70 74  Resp: 18 18  Temp: 36.6 C 36.6 C    Last Pain:  Vitals:   09/21/15 0500  TempSrc:   PainSc: 2                  Lewie LoronJohn Orrie Lascano

## 2015-09-21 NOTE — Progress Notes (Signed)
Physical Therapy Treatment Patient Details Name: Brett Salinas MRN: [id number] DOB: 03/27/1970 Today's Date: 09/21/2015    History of Present Illness pt presents after sustaining a R Achilles Laceration and Rupture, now s/p Repair.  pt with hx of HTN.      PT Comments    Pt performed increased mobility and progressed gait training.  Cues provided for safety.  PTA lifted LLE to place foam under limb to elevate and patient reports pain after touching his L lateral ankle.  Pt reports his spiritual powers give him strength to heal.    Follow Up Recommendations  CIR     Equipment Recommendations  Rolling walker with 5" wheels;3in1 (PT)    Recommendations for Other Services       Precautions / Restrictions Precautions Precautions: Fall Precaution Comments: Wound Vac to R LE Restrictions Weight Bearing Restrictions: Yes RLE Weight Bearing: Non weight bearing    Mobility  Bed Mobility Overal bed mobility: Modified Independent                Transfers Overall transfer level: Modified independent Equipment used: Rolling walker (2 wheeled)   Sit to Stand: Modified independent (Device/Increase time);Min assist (mod I for sit to stand and min assist for stand to sit to control eccentric loading.  )         General transfer comment: Cues for sequencing to control eccentric loading otherwise demonstrates good technique.    Ambulation/Gait Ambulation/Gait assistance: Min guard Ambulation Distance (Feet): 180 Feet Assistive device: Rolling walker (2 wheeled) Gait Pattern/deviations: Step-to pattern   Gait velocity interpretation: Below normal speed for age/gender General Gait Details: Cues for use of RW and upper trunk control, require two standing rest breaks.  Good technique.     Stairs            Wheelchair Mobility    Modified Rankin (Stroke Patients Only)       Balance Overall balance assessment: Needs assistance   Sitting balance-Leahy Scale: Good     Standing balance support: During functional activity;Bilateral upper extremity supported Standing balance-Leahy Scale: Fair                      Cognition Arousal/Alertness: Awake/alert Behavior During Therapy: WFL for tasks assessed/performed Overall Cognitive Status: Within Functional Limits for tasks assessed                      Exercises      General Comments        Pertinent Vitals/Pain Pain Assessment: 0-10 Pain Score: 6  Pain Location: RLE after mobility Pain Descriptors / Indicators: Aching;Grimacing;Guarding Pain Intervention(s): Monitored during session;Repositioned;Premedicated before session;Patient requesting pain meds-RN notified (refused Ice. )    Home Living                      Prior Function            PT Goals (current goals can now be found in the care plan section) Acute Rehab PT Goals Patient Stated Goal: to run and jump again on his foot.   Potential to Achieve Goals: Good Progress towards PT goals: Progressing toward goals    Frequency  Min 3X/week    PT Plan Current plan remains appropriate    Co-evaluation             End of Session Equipment Utilized During Treatment: Gait belt Activity Tolerance: Patient tolerated treatment well;Patient limited by pain Patient left:  in chair;with call bell/phone within reach     Time: 1107-1130 PT Time Calculation (min) (ACUTE ONLY): 23 min  Charges:  $Gait Training: 8-22 mins $Therapeutic Activity: 8-22 mins                    G Codes:      Florestine Aversimee J Paulette [place] 09/21/2015, 11:39 AM  Joycelyn RuaAimee Iveliz Garay, PTA pager [phone]

## 2015-09-22 NOTE — Progress Notes (Signed)
Physical Therapy Treatment Patient Details Name: Brett Salinas MRN: [id number] DOB: 12/17/1970 Today's Date: 09/22/2015    History of Present Illness pt presents after sustaining a R Achilles Laceration and Rupture, now s/p Repair.  pt with hx of HTN.      PT Comments    Pt continues to make improvements in mobility with only physical A being to manage wound vac.  Noted CIR declined pt and now plan is for SNF.  Feel pt would benefit from SNF for safety and further management of wound vac/dressings.  Will continue to follow.    Follow Up Recommendations  SNF     Equipment Recommendations  Rolling walker with 5" wheels;3in1 (PT)    Recommendations for Other Services       Precautions / Restrictions Precautions Precautions: Fall Precaution Comments: Wound Vac to R LE Restrictions Weight Bearing Restrictions: Yes RLE Weight Bearing: Non weight bearing    Mobility  Bed Mobility Overal bed mobility: Modified Independent                Transfers Overall transfer level: Modified independent Equipment used: Rolling walker (2 wheeled) Transfers: Sit to/from Stand Sit to Stand: Modified independent (Device/Increase time);Supervision         General transfer comment: Again needed cueing for better control when returning to sitting.    Ambulation/Gait Ambulation/Gait assistance: Min guard Ambulation Distance (Feet): 200 Feet Assistive device: Rolling walker (2 wheeled) Gait Pattern/deviations: Step-to pattern     General Gait Details: Cues for slowing down and making sure RW is not still moving when he is trying to hop.  pt donned his sneaker prior to ambulating today.     Stairs            Wheelchair Mobility    Modified Rankin (Stroke Patients Only)       Balance Overall balance assessment: Needs assistance Sitting-balance support: No upper extremity supported;Feet supported Sitting balance-Leahy Scale: Good     Standing balance support:  Bilateral upper extremity supported;During functional activity Standing balance-Leahy Scale: Fair                      Cognition Arousal/Alertness: Awake/alert Behavior During Therapy: WFL for tasks assessed/performed Overall Cognitive Status: Within Functional Limits for tasks assessed                      Exercises      General Comments        Pertinent Vitals/Pain Pain Assessment: No/denies pain    Home Living                      Prior Function            PT Goals (current goals can now be found in the care plan section) Acute Rehab PT Goals Patient Stated Goal: to run and jump again on his foot.   PT Goal Formulation: With patient Time For Goal Achievement: 10/04/15 Potential to Achieve Goals: Good Progress towards PT goals: Progressing toward goals    Frequency  Min 3X/week    PT Plan Discharge plan needs to be updated    Co-evaluation             End of Session Equipment Utilized During Treatment: Gait belt Activity Tolerance: Patient tolerated treatment well Patient left: in chair;with call bell/phone within reach     Time: 4098-11911056-1125 PT Time Calculation (min) (ACUTE ONLY): 29 min  Charges:  $Gait Training:  23-37 mins                    G CodesSunny Salinas:      Brett Salinas, [place] [phone] 09/22/2015, 12:24 PM
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
## 2015-09-23 NOTE — Progress Notes (Signed)
provena wound vac in place Mobilizing well Ready medically for dc to another non inpatient setting Will check incision 7 days from surgert

## 2015-09-24 MED ORDER — METHOCARBAMOL 500 MG PO TABS: 500.0000 mg | ORAL_TABLET | Freq: Four times a day (QID) | ORAL | 0 refills | Status: DC | PRN

## 2015-09-24 MED ORDER — RIVAROXABAN 10 MG PO TABS: 10.0000 mg | ORAL_TABLET | Freq: Every day | ORAL | 0 refills | Status: DC

## 2015-09-24 MED ORDER — OXYCODONE HCL 5 MG PO TABS: 5.0000 mg | ORAL_TABLET | ORAL | 0 refills | Status: DC | PRN

## 2015-09-24 MED ORDER — DOCUSATE SODIUM 100 MG PO CAPS: 100.0000 mg | ORAL_CAPSULE | Freq: Two times a day (BID) | ORAL | 0 refills | Status: DC

## 2015-09-24 NOTE — Progress Notes (Signed)
Patient is on Difficult to Place list per CSW Assistant Director since patient has a wound vac and has no insurance.  Osborne Cascoadia Sampson Self LCSWA [phone]

## 2015-09-24 NOTE — Progress Notes (Signed)
Pt stable - no issues - ready for dc medically to another place - all paperwork done

## 2015-09-24 NOTE — Progress Notes (Signed)
Night shift RN report patient wanted IV removed and refused to receive a new one. Patient currently has no IV placement.

## 2015-09-24 NOTE — Discharge Summary (Addendum)
Physician Discharge Summary  Patient ID: Brett Salinas MRN: [id number] DOB/AGE: 46/05/1970 46 y.o.  Admit date: 09/19/2015 Discharge date: 09/30/2015  Admission Diagnoses:  Active Problems:   Achilles tendon rupture   Discharge Diagnoses:  Same  Surgeries: Procedure(s): IRRIGATION AND DEBRIDEMENT ANKLE LACERATIONS POSSIBLE TENDON REPAIR on 09/19/2015   Consultants:   Discharged Condition: Stable  Hospital Course: Brett BinetDerrick Ngo is an 45 y.o. male who was admitted 09/19/2015 with a chief complaint of  Chief Complaint  Patient presents with  . Laceration  , and found to have a diagnosis of <principal problem not specified>.  They were brought to the operating room on 09/19/2015 and underwent the above named procedures.Wound vac placed and 48 hours of iv abx given. NWB initiated and maintained.Incision ok 7 d post op . Cast placed nwb Dced in good condition on xarelto - f/u Monday    Antibiotics given:  Anti-infectives    Start     Dose/Rate Route Frequency Ordered Stop   09/19/15 2200  ceFAZolin (ANCEF) IVPB 2g/100 mL premix     2 g 200 mL/hr over 30 Minutes Intravenous Every 6 hours 09/19/15 2057 09/21/15 0955   09/19/15 1802  vancomycin (VANCOCIN) powder  Status:  Discontinued       As needed 09/19/15 1803 09/19/15 1933   09/19/15 1345  ceFAZolin (ANCEF) IVPB 2g/100 mL premix     2 g 200 mL/hr over 30 Minutes Intravenous  Once 09/19/15 1330 09/19/15 1505    .  Recent vital signs:  Vitals:   09/23/15 2055 09/24/15 0500  BP: (!) 147/93 119/74  Pulse: 92 73  Resp: 20 18  Temp: 97.9 F (36.6 C) 98.3 F (36.8 C)    Recent laboratory studies:  Results for orders placed or performed during the hospital encounter of 09/19/15  Ethanol  Result Value Ref Range   Alcohol, Ethyl (B) <5 <5 mg/dL  Basic metabolic panel  Result Value Ref Range   Sodium 139 135 - 145 mmol/L   Potassium 3.6 3.5 - 5.1 mmol/L   Chloride 106 101 - 111 mmol/L   CO2 28 22 - 32 mmol/L   Glucose,  Bld 120 (H) 65 - 99 mg/dL   BUN 18 6 - 20 mg/dL   Creatinine, Ser 4.091.30 (H) 0.61 - 1.24 mg/dL   Calcium 9.1 8.9 - 81.110.3 mg/dL   GFR calc non Af Amer >60 >60 mL/min   GFR calc Af Amer >60 >60 mL/min   Anion gap 5 5 - 15  CBC with Differential  Result Value Ref Range   WBC 8.3 4.0 - 10.5 K/uL   RBC 4.61 4.22 - 5.81 MIL/uL   Hemoglobin 13.4 13.0 - 17.0 g/dL   HCT 91.441.0 78.239.0 - 95.652.0 %   MCV 88.9 78.0 - 100.0 fL   MCH 29.1 26.0 - 34.0 pg   MCHC 32.7 30.0 - 36.0 g/dL   RDW 21.314.2 08.611.5 - 57.815.5 %   Platelets 269 150 - 400 K/uL   Neutrophils Relative % 75 %   Neutro Abs 6.2 1.7 - 7.7 K/uL   Lymphocytes Relative 20 %   Lymphs Abs 1.6 0.7 - 4.0 K/uL   Monocytes Relative 4 %   Monocytes Absolute 0.3 0.1 - 1.0 K/uL   Eosinophils Relative 1 %   Eosinophils Absolute 0.1 0.0 - 0.7 K/uL   Basophils Relative 0 %   Basophils Absolute 0.0 0.0 - 0.1 K/uL    Discharge Medications:     Medication List  STOP taking these medications   diclofenac 75 MG EC tablet Commonly known as:  VOLTAREN   naproxen sodium 220 MG tablet Commonly known as:  ANAPROX     TAKE these medications   docusate sodium 100 MG capsule Commonly known as:  COLACE Take 1 capsule (100 mg total) by mouth 2 (two) times daily.   methocarbamol 500 MG tablet Commonly known as:  ROBAXIN Take 1 tablet (500 mg total) by mouth every 6 (six) hours as needed for muscle spasms.   oxyCODONE 5 MG immediate release tablet Commonly known as:  Oxy IR/ROXICODONE Take 1-2 tablets (5-10 mg total) by mouth every 3 (three) hours as needed for breakthrough pain.   rivaroxaban 10 MG Tabs tablet Commonly known as:  XARELTO Take 1 tablet (10 mg total) by mouth daily.       Diagnostic Studies: Dg Chest 2 View  Result Date: 09/19/2015 CLINICAL DATA:  Preoperative evaluation for ankle region surgery. Hypertension. EXAM: CHEST  2 VIEW COMPARISON:  None. FINDINGS: Lungs are clear. Heart size and pulmonary vascularity are normal. No adenopathy.  No bone lesions. IMPRESSION: No edema or consolidation. Electronically Signed   By: Bretta Bang III M.D.   On: 09/19/2015 14:37   Dg Ankle Complete Right  Result Date: 09/19/2015 CLINICAL DATA:  Laceration posterior ankle region EXAM: RIGHT ANKLE - COMPLETE 3+ VIEW COMPARISON:  None. FINDINGS: Frontal, oblique, and lateral views were obtained. There is extensive soft tissue injury posterior to the ankle joint region. There is no acute fracture or joint effusion. The ankle mortise appears intact. Calcification anterior to the dorsal distal talus may represent residua of previous trauma. There is no appreciable joint space narrowing or erosion. IMPRESSION: Extensive soft tissue injury posterior to the ankle joint without bony abnormality. No acute fracture or joint effusion. Question residua of old trauma adjacent to the distal talus dorsally. Electronically Signed   By: Bretta Bang III M.D.   On: 09/19/2015 11:53    Disposition: 01-Home or Self Care  Discharge Instructions    Call MD / Call 911    Complete by:  As directed   If you experience chest pain or shortness of breath, CALL 911 and be transported to the hospital emergency room.  If you develope a fever above 101 F, pus (white drainage) or increased drainage or redness at the wound, or calf pain, call your surgeon's office.   Constipation Prevention    Complete by:  As directed   Drink plenty of fluids.  Prune juice may be helpful.  You may use a stool softener, such as Colace (over the counter) 100 mg twice a day.  Use MiraLax (over the counter) for constipation as needed.   Diet - low sodium heart healthy    Complete by:  As directed   Discharge instructions    Complete by:  As directed   Non weight bearing on foot Keep provena in place Return to clinic monday   Increase activity slowly as tolerated    Complete by:  As directed         Signed: DEAN,GREGORY SCOTT 09/24/2015, 8:20 AM

## 2015-09-25 ENCOUNTER — Encounter (HOSPITAL_COMMUNITY): Payer: Self-pay

## 2015-09-25 NOTE — Progress Notes (Signed)
Physical Therapy Treatment Patient Details Name: Brett Salinas MRN: [id number] DOB: 04/20/1970 Today's Date: 09/25/2015    History of Present Illness pt presents after sustaining a R Achilles Laceration and Rupture, now s/p Repair.  pt with hx of HTN.      PT Comments    Pt performed gait training with emphasis of RW safety.  Pt remain to wait for placement to SNF for continued rehab and wound care.  Will continue to improve mobility during acute hospitalization.    Follow Up Recommendations  SNF     Equipment Recommendations  Rolling walker with 5" wheels;3in1 (PT)    Recommendations for Other Services Rehab consult     Precautions / Restrictions Precautions Precautions: Fall Precaution Comments: Wound Vac to R LE Restrictions Weight Bearing Restrictions: Yes RLE Weight Bearing: Non weight bearing    Mobility  Bed Mobility Overal bed mobility: Modified Independent                Transfers Overall transfer level: Modified independent Equipment used: Rolling walker (2 wheeled) Transfers: Sit to/from Stand Sit to Stand: Modified independent (Device/Increase time)         General transfer comment: Good technique and maintenance of NWB restrictrion.    Ambulation/Gait Ambulation/Gait assistance: Min guard Ambulation Distance (Feet): 180 Feet Assistive device: Rolling walker (2 wheeled) Gait Pattern/deviations: Step-to pattern   Gait velocity interpretation: Below normal speed for age/gender General Gait Details: Pt remains to require cues for slowing down and making sure RW is not still moving when he is trying to hop.  Pt refused to [employer] sneaker and reports he feels less of a jolt without his sneaker.     Stairs            Wheelchair Mobility    Modified Rankin (Stroke Patients Only)       Balance Overall balance assessment: Needs assistance   Sitting balance-Leahy Scale: Good       Standing balance-Leahy Scale: Fair                       Cognition Arousal/Alertness: Awake/alert Behavior During Therapy: WFL for tasks assessed/performed Overall Cognitive Status: Within Functional Limits for tasks assessed                      Exercises      General Comments        Pertinent Vitals/Pain Pain Assessment: No/denies pain Pain Location: RLE after mobility reports pain ranges from a 4 in his toes to a 6 in his mid calf.   Pain Descriptors / Indicators: Aching;Grimacing;Guarding Pain Intervention(s): Monitored during session;Repositioned    Home Living                      Prior Function            PT Goals (current goals can now be found in the care plan section) Acute Rehab PT Goals Patient Stated Goal: to run and jump again on his foot.   Potential to Achieve Goals: Good Progress towards PT goals: Progressing toward goals    Frequency  Min 3X/week    PT Plan Discharge plan needs to be updated    Co-evaluation             End of Session Equipment Utilized During Treatment: Gait belt Activity Tolerance: Patient tolerated treatment well Patient left: in chair;with call bell/phone within reach     Time: 1047-1103 PT  Time Calculation (min) (ACUTE ONLY): 16 min  Charges:  $Gait Training: 8-22 mins                    G Codes:      Florestine Avers October 11, 2015, 12:49 PM Joycelyn Rua, PTA pager [phone]

## 2015-09-25 NOTE — Progress Notes (Signed)
No change in medical condition Plan to remove the wound VAC tomorrow from incision Patient pending placement

## 2015-09-26 LAB — CBC WITH DIFFERENTIAL/PLATELET
Basophils Absolute: 0 10*3/uL (ref 0.0–0.1)
Basophils Relative: 1 %
Eosinophils Absolute: 0.2 10*3/uL (ref 0.0–0.7)
Eosinophils Relative: 4 %
HEMATOCRIT: 44.4 % (ref 39.0–52.0)
HEMOGLOBIN: 14.2 g/dL (ref 13.0–17.0)
LYMPHS ABS: 2 10*3/uL (ref 0.7–4.0)
LYMPHS PCT: 36 %
MCH: 28.7 pg (ref 26.0–34.0)
MCHC: 32 g/dL (ref 30.0–36.0)
MCV: 89.7 fL (ref 78.0–100.0)
Monocytes Absolute: 0.4 10*3/uL (ref 0.1–1.0)
Monocytes Relative: 7 %
NEUTROS ABS: 3 10*3/uL (ref 1.7–7.7)
NEUTROS PCT: 52 %
Platelets: 253 10*3/uL (ref 150–400)
RBC: 4.95 MIL/uL (ref 4.22–5.81)
RDW: 13.5 % (ref 11.5–15.5)
WBC: 5.6 10*3/uL (ref 4.0–10.5)

## 2015-09-26 LAB — BASIC METABOLIC PANEL
Anion gap: 8 (ref 5–15)
BUN: 11 mg/dL (ref 6–20)
CHLORIDE: 102 mmol/L (ref 101–111)
CO2: 29 mmol/L (ref 22–32)
Calcium: 9.8 mg/dL (ref 8.9–10.3)
Creatinine, Ser: 1.22 mg/dL (ref 0.61–1.24)
GFR calc Af Amer: >60 mL/min (ref >60)
GFR calc non Af Amer: >60 mL/min (ref >60)
GLUCOSE: 98 mg/dL (ref 65–99)
POTASSIUM: 4.1 mmol/L (ref 3.5–5.1)
SODIUM: 139 mmol/L (ref 135–145)

## 2015-09-26 NOTE — Progress Notes (Signed)
Patient is stable today. VAC was removed and dry dressing applied. Wound is c/d/i and is skin edges viable. Continue NWB SNF pending - patient is homeless  N. Glee ArvinMichael Nathan Moctezuma, MD [facility] [phone] 8:41 AM

## 2015-09-26 NOTE — Plan of Care (Signed)
Problem: Skin Integrity: Goal: Risk for impaired skin integrity will decrease Outcome: Progressing Wound to the right foot with dressing and wound vac on  Problem: Tissue Perfusion: Goal: Risk factors for ineffective tissue perfusion will decrease Outcome: Progressing No S/S of DVT  Problem: Fluid Volume: Goal: Ability to maintain a balanced intake and output will improve Outcome: Progressing Good appetite

## 2015-09-26 NOTE — Clinical Social Work Note (Signed)
CSW spoke with pt regarding discharge planning. Pt informed CSW that he was staying a hotel but does not have place to go once discharged from the hospital.  pT has no family in the area, all pt's family [place] in [place]. Pt stated he spoke with financial planning and was informed that he would receive 50% -100% coverage at a medical facility to provide pt with ongoing care. CSW paged MD to gather information regarding pt discharge from the hospital.

## 2015-09-27 NOTE — Progress Notes (Signed)
Patient stable, dressing c/d/i Plan for dc to SNF Monday NWB Up with PT  N. Glee ArvinMichael Mariajose Mow, MD [facility] [phone] 1:02 PM

## 2015-09-28 NOTE — Progress Notes (Signed)
Pt stable slc applied Medically ready for dc

## 2015-09-28 NOTE — Progress Notes (Signed)
Physical therapy stated patient told her with how hard it has been for the hospital to find him placement he should just go to a asylum and say that he is going to kill himself. Spoke to patient about this comment and he stated he hates being deceitful but if that's what he has to do to get a place he may do it. Patient denied having an active plan. Spoke to SW and made Dr August Saucerean aware of what the patient is saying.

## 2015-09-28 NOTE — Progress Notes (Signed)
Orthopedic Tech Progress Note Patient Details:  Oren BinetDerrick Preis 03/12/1970 [id number]  Patient ID: Oren Bineterrick Rami, male   DOB: 07/16/1970, 45 y.o.   MRN: [id number]   Nikki DomCrawford, Dulce Martian 09/28/2015, 1:42 PM Viewed order from doctor's order

## 2015-09-28 NOTE — Progress Notes (Signed)
Physical Therapy Treatment Patient Details Name: Simuel Stebner MRN: [id number] DOB: 01-Oct-1970 Today's Date: 09/28/2015    History of Present Illness pt presents after sustaining a R Achilles Laceration and Rupture, now s/p Repair.  pt with hx of HTN.      PT Comments    Pt appears depressed on arrival reporting he does not know where he will go.  Pt reports he feels like his life is so terrible that he has thought about killing himself.  PTA reported this information to nursing.  Pt progressing with therapy at this time and wound vac has been removed.  Pt remains slightly unsteady with RW and requires min guard for safety.  PTA educated patient on stair training in event that he does not go to rehab.  Pt would remain to benefit from rehab in a post acute setting to address balance deficits with new weight bearing diagnosis after injury.  Pt appears in good spirits after PT session.    Follow Up Recommendations  SNF     Equipment Recommendations  Rolling walker with 5" wheels;3in1 (PT)    Recommendations for Other Services Rehab consult     Precautions / Restrictions Precautions Precautions: Fall Precaution Comments: wound vac is removed.   Restrictions Weight Bearing Restrictions: Yes RLE Weight Bearing: Non weight bearing    Mobility  Bed Mobility Overal bed mobility: Modified Independent                Transfers Overall transfer level: Modified independent Equipment used: Rolling walker (2 wheeled) Transfers: Sit to/from Stand Sit to Stand: Modified independent (Device/Increase time)         General transfer comment: Good technique and maintenance of NWB restrictrion.    Ambulation/Gait Ambulation/Gait assistance: Min guard Ambulation Distance (Feet): 180 Feet Assistive device: Rolling walker (2 wheeled) Gait Pattern/deviations: Step-to pattern   Gait velocity interpretation: <1.8 ft/sec, indicative of risk for recurrent falls General Gait Details: Better  carryover with step to pattern.  Pt stopping RW when advancing forward.  Presents with posterior lean and required cues for forward weight shifting to correct body position.   Stairs Stairs: Yes Stairs assistance: Min assist Stair Management: No rails;With walker;Step to pattern;Backwards;Forwards Number of Stairs: 2 General stair comments: Cues for sequencing and RW placement to negotiate x2 stairs.  Pt performed with RW backwards to ascend and forwards to descend.  Pt educated on need for assist with RW technique.  Pt may benefit from stair negotiation with crutches to improve independence due to lack of caregiver support.    Wheelchair Mobility    Modified Rankin (Stroke Patients Only)       Balance Overall balance assessment: Needs assistance Sitting-balance support: No upper extremity supported;Feet supported Sitting balance-Leahy Scale: Good     Standing balance support: Bilateral upper extremity supported;During functional activity Standing balance-Leahy Scale: Fair                      Cognition Arousal/Alertness: Awake/alert Behavior During Therapy: WFL for tasks assessed/performed Overall Cognitive Status: Within Functional Limits for tasks assessed                      Exercises Total Joint Exercises Quad Sets: AROM;Right;5 reps;Supine Heel Slides: AROM;Right;5 reps;Supine Hip ABduction/ADduction: AROM;Right;5 reps;Supine Straight Leg Raises: AROM;Right;5 reps;Supine    General Comments        Pertinent Vitals/Pain Pain Assessment: 0-10 Pain Score: 6  Pain Location: RLE.   Pain Descriptors / Indicators:  Aching;Grimacing;Guarding Pain Intervention(s): Monitored during session;Repositioned    Home Living                      Prior Function            PT Goals (current goals can now be found in the care plan section) Acute Rehab PT Goals Patient Stated Goal: to run and jump again on his foot.   Potential to Achieve Goals:  Good Progress towards PT goals: Progressing toward goals    Frequency  Min 3X/week    PT Plan Current plan remains appropriate    Co-evaluation             End of Session Equipment Utilized During Treatment: Gait belt Activity Tolerance: Patient tolerated treatment well Patient left: in chair;with call bell/phone within reach     Time: 0917-0932 PT Time Calculation (min) (ACUTE ONLY): 15 min  Charges:  $Gait Training: 8-22 mins                    G Codes:      Florestine Aversimee J Jaceyon Strole 09/28/2015, 9:49 AM Joycelyn RuaAimee Illianna Paschal, PTA pager [phone]

## 2015-09-28 NOTE — Progress Notes (Signed)
Per Medical Director, patient will be placed on our Difficult to Place list, which has a waiting list. Until then, patient may need to remain in the hospital until his cast comes off. No facilities will accept patient with a criminal background. As soon as medical team deems it safe for patient to discharge, patient will go to a homeless shelter if no DTP beds are available.  Osborne Cascoadia Marizol Borror LCSWA [phone]

## 2015-09-28 NOTE — Progress Notes (Signed)
Orthopedic Tech Progress Note Patient Details:  Brett BinetDerrick Salinas 01/05/1971 [id number]  Casting Type of Cast: Short leg cast Cast Location: rle Cast Material: Fiberglass Cast Intervention: Application     Nikki Domrawford, Wednesday Ericsson 09/28/2015, 1:41 PM

## 2015-09-28 NOTE — Progress Notes (Signed)
Patient currently has no SNF needs now that wound vac has been discontinued. Patient walking 180 feet three days ago. Patient's kidnapping charges and lack of insurance pose as a barrier to placement. CSW recommends patient discharge to a halfway house-- resources provided to patient. CSW can provide a bus pass if needed.  Osborne Cascoadia Sila Sarsfield LCSWA [phone]

## 2015-09-29 NOTE — Progress Notes (Signed)
CSW following to assist with potential SNF placement  [facility]  Burna SisJenna H. Denetra Formoso, Theresia MajorsLCSWA Clinical Social Worker [phone]

## 2015-09-29 NOTE — Progress Notes (Signed)
Pt stated that he woke up in a sweat temp taken 98.2. Will continue to monitor. Ilean SkillVeronica Ezequias Lard LPN

## 2015-09-30 NOTE — Progress Notes (Signed)
No new issues - as has been the case for the last 10 days he is ready for dc

## 2015-09-30 NOTE — Progress Notes (Signed)
Physical Therapy Treatment Patient Details Name: Brett BinetDerrick Salinas MRN: [id number] DOB: 07/14/1970 Today's Date: 09/30/2015    History of Present Illness pt presents after sustaining a R Achilles Laceration and Rupture, now s/p Repair.  pt with hx of HTN.      PT Comments    Pt is an unfortunate gentleman who is progressing well with therapy but remains to be unsteady and limited due to NWB on R foot.  Pt currently in cast and awaiting placement for continued rehab.  PTA performed crutch training including gait and stairs.  Pt catches on quick to negotiate crutches on level ground but remains unsteady on stairs.  NO LOB during treatment.  Will inform supervising PT to assess patient and determine the best route for d/c as patient continues to progress towards his goals.    Follow Up Recommendations        Equipment Recommendations       Recommendations for Other Services       Precautions / Restrictions Precautions Precautions: Fall Precaution Comments: wound vac is removed.  Pt with cast in place.   Restrictions Weight Bearing Restrictions: Yes RLE Weight Bearing: Non weight bearing    Mobility  Bed Mobility Overal bed mobility: Modified Independent             General bed mobility comments: Pt observed rolling into prone and back into supine unassisted to reach of side of bed and grab his sneaker.   Transfers Overall transfer level: Modified independent Equipment used: None (crutches used for stand to sit. ) Transfers: Sit to/from Stand (Pt stood impulsively and PTA brought crutches to him.  pt unsteady but rights balance well with no LOB.  ) Sit to Stand: Modified independent (Device/Increase time)         General transfer comment: Good maintenance of NWB restrictrion.   Ambulation/Gait Ambulation/Gait assistance: Supervision Ambulation Distance (Feet): 200 Feet Assistive device: Crutches Gait Pattern/deviations: Step-through pattern;Trunk flexed   Gait  velocity interpretation: Below normal speed for age/gender General Gait Details: Cues for crutch placement and upper trunk control.  Adjusted crutches mid way for improved height and to improve comfort in B axilla.  Pt demonstrates good technique.     Stairs Stairs: Yes Stairs assistance: Min assist Stair Management: No rails;Step to pattern;Forwards;With crutches Number of Stairs: 12 General stair comments: Cues for sequencing and crutch placement from stair to stair.  Pt mildly unsteady during descent of stairs.  pt would benefit from continued training.    Wheelchair Mobility    Modified Rankin (Stroke Patients Only)       Balance     Sitting balance-Leahy Scale: Good       Standing balance-Leahy Scale: Fair                      Cognition Arousal/Alertness: Awake/alert Behavior During Therapy: WFL for tasks assessed/performed Overall Cognitive Status: Within Functional Limits for tasks assessed                      Exercises      General Comments        Pertinent Vitals/Pain Pain Assessment: 0-10 Pain Score: 4  Pain Location: RLE.   Pain Descriptors / Indicators: Aching;Operative site guarding Pain Intervention(s): Monitored during session;Repositioned    Home Living                      Prior Function  PT Goals (current goals can now be found in the care plan section) Acute Rehab PT Goals Patient Stated Goal: to run and jump again on his foot.   Potential to Achieve Goals: Good Progress towards PT goals: Progressing toward goals    Frequency  Min 3X/week    PT Plan      Co-evaluation             End of Session Equipment Utilized During Treatment: Gait belt Activity Tolerance: Patient tolerated treatment well Patient left: in chair;with call bell/phone within reach     Time: 1610-9604 PT Time Calculation (min) (ACUTE ONLY): 16 min  Charges:  $Gait Training: 8-22 mins                    G Codes:       Florestine Avers 10/20/2015, 5:03 PM  Joycelyn Rua, PTA pager [phone]

## 2015-09-30 NOTE — Progress Notes (Signed)
CSW continuing to follow to assist with placement needs- [facility] doesn't have beds at this time, [facility] reviewing still  CS[facility] but referral was denied  CSW also informed by patient of other possible barrier to placement- pt has post-release appointments with Officer Georgina PillionJohnnie Smallwood [phone]( [phone]) for meetings/drug screenings- CSW spoke with Officer Earley FavorSmallwood who states these services could be moved to another county if patient needs to be placed out of county- CSW would just need to fax documentation of patient placement and need for placement ([phone]), also cleared with Officer Earley FavorSmallwood that as long as narcotics were listed on patient DC this should not be an issue with drug screenings.  CSW reviewed placement status with supervisor- unsure of patient dispo at this time  Burna SisJenna H. Dnaiel Voller, Theresia MajorsLCSWA Clinical Social Worker [phone]

## 2015-10-01 NOTE — Progress Notes (Signed)
Patient discussed in Quality Collaborative Meeting this morning- due to patient high follow up needs unable to be transferred to out of county facility.  No facilities able to accept in local area due to criminal background.  Per medical director goal will be to keep patient in the hospital till he is mobile enough to transfer to an area shelter.  CSW signing off at this time- please reconsult when pt closer to DC if further assistance is needed with disposition  Burna SisJenna H. Siana Panameno, LCSWA Clinical Social Worker [phone]

## 2015-10-01 NOTE — Progress Notes (Signed)
Physical Therapy Treatment Patient Details Name: Brett Salinas MRN: [id number] DOB: 19-Apr-1970 Today's Date: 10/01/2015    History of Present Illness pt presents after sustaining a R Achilles Laceration and Rupture, now s/p Repair.  pt with hx of HTN.      PT Comments    Pt continues to demonstrate improvements in his overall mobility, but is limited in his independence by his NWBing status.  Continue to feel SNF is most appropriate D/C location to ensure safety and promote continued healing and best care of L LE.  If pt D/Cs to [facility], then he will need the DME listed below.  Will continue to follow.    Follow Up Recommendations  SNF     Equipment Recommendations  Rolling walker with 5" wheels;3in1 (PT);Wheelchair (measurements PT);Wheelchair cushion (measurements PT) (Will need DME if does not D/C to SNF.)    Recommendations for Other Services       Precautions / Restrictions Precautions Precautions: Fall Precaution Comments: wound vac is removed.  Pt with cast in place.   Restrictions Weight Bearing Restrictions: Yes RLE Weight Bearing: Non weight bearing    Mobility  Bed Mobility Overal bed mobility: Modified Independent                Transfers Overall transfer level: Modified independent Equipment used: None Transfers: Sit to/from Stand Sit to Stand: Modified independent (Device/Increase time)         General transfer comment: pt does stand impulsively, but does well maintaining NWBing.  Instructed pt he should wait to stand until PT has brought him his crutches.    Ambulation/Gait Ambulation/Gait assistance: Supervision Ambulation Distance (Feet): 250 Feet Assistive device: Crutches Gait Pattern/deviations: Step-through pattern;Trunk flexed     General Gait Details: cues to slow down to better coordinate crutch movement and improve safety.  cues also for decreasing step length to improve balance and stability.     Stairs Stairs:  Yes Stairs assistance: Min assist Stair Management: One rail Right;Step to pattern;Forwards;With crutches Number of Stairs: 12 (x2) General stair comments: cues for use of single crutch and single rail.  pt able to perform with single crutch in one hand and then placing both crutches in one hand as he would have to do if he was alone.  cues for use of crutch and positioning R LE.    Wheelchair Mobility    Modified Rankin (Stroke Patients Only)       Balance Overall balance assessment: Needs assistance Sitting-balance support: No upper extremity supported;Feet supported Sitting balance-Leahy Scale: Good     Standing balance support: Single extremity supported;Bilateral upper extremity supported;No upper extremity supported;During functional activity Standing balance-Leahy Scale: Fair Standing balance comment: pt able to stand briefly without UE support, but needs UEs for any balance challenges.                      Cognition Arousal/Alertness: Awake/alert Behavior During Therapy: WFL for tasks assessed/performed Overall Cognitive Status: Within Functional Limits for tasks assessed                      Exercises      General Comments        Pertinent Vitals/Pain Pain Assessment: 0-10 Pain Score: 3  Pain Location: R LE Pain Descriptors / Indicators: Aching Pain Intervention(s): Monitored during session;Premedicated before session;Repositioned    Home Living  Prior Function            PT Goals (current goals can now be found in the care plan section) Acute Rehab PT Goals Patient Stated Goal: to run and jump again on his foot.   PT Goal Formulation: With patient Time For Goal Achievement: 10/04/15 Potential to Achieve Goals: Good Progress towards PT goals: Progressing toward goals    Frequency  Min 3X/week    PT Plan Current plan remains appropriate    Co-evaluation             End of Session Equipment  Utilized During Treatment: Gait belt Activity Tolerance: Patient tolerated treatment well Patient left: in bed;with call bell/phone within reach;with nursing/sitter in room     Time: [id number] PT Time Calculation (min) (ACUTE ONLY): 24 min  Charges:  $Gait Training: 23-37 mins                    G CodesSunny Schlein:      Brett Salinas F, [place]PT [phone] 10/01/2015, 11:47 AM

## 2015-10-01 NOTE — Progress Notes (Signed)
Per Dr. August Saucerean, pt will d/c when placement found.

## 2015-10-01 NOTE — Progress Notes (Signed)
Patient is ready for discharge He needs crutches and xarelto He should be nonweightbearing on that affected extremity until his clinic appointment next week At that time I'll remove the cast inspect the incision and likely put him in a fracture boot with some protected weightbearing As has been the case for n the last week he really has no current acute inpatient needs

## 2015-10-01 NOTE — Progress Notes (Signed)
CSW informed by MD that patient would be safe to DC to a shelter on crutches as long as he is provided medication assistance and crutches.  Shelter did not have bed this evening but states pt can come with crutches as long as he is able to care for himself and suggest he come tomorrow morning for intake  CSW left message for RNCM to follow up with patient for equipment and medication needs in the morning.  Burna SisJenna H. Chukwuebuka Churchill, LCSWA Clinical Social Worker [phone]

## 2015-10-02 MED FILL — oxyCODONE HCL 5 MG TABS: 5 | 3 days supply | Qty: 30 | Fill #0

## 2015-10-02 NOTE — Progress Notes (Addendum)
Review discharge papers with Mr Brett Salinas with full understanding, pt waiting on wheelchair and crutches to be discharged

## 2015-10-02 NOTE — Care Management (Signed)
Father Assunta FoundOnouphry, a retired priest that has beeing working with patient stated that His [facility] and He will be working with others in the faith community to provide further Lodging. Case manager notified Social worker Osborne Cascoadia and placed call to Dr. August Saucerean to get confirmation of discharge plan.

## 2015-10-02 NOTE — Progress Notes (Signed)
Patient stable Cast in place Right leg nonweightbearing Ready for discharge.  I discussed with him today the need to remain nonweightbearing with crutches or wheelchair.  Prescription for xarelto and pain medicine is in the chart he'll need to follow up with me next week

## 2015-10-02 NOTE — Progress Notes (Signed)
CSW provided patient with taxi voucher to shelter and resources-given to RN.  CSW signing off.  Osborne Cascoadia Mateja Dier LCSWA [phone]

## 2015-10-02 NOTE — Progress Notes (Signed)
Orthopedic Tech Progress Note Patient Details:  Brett BinetDerrick Salinas 02/03/1970 [id number]  Ortho Devices Type of Ortho Device: Crutches Ortho Device/Splint Interventions: Application   Saul FordyceJennifer C Carlon Chaloux 10/02/2015, 2:10 PM

## 2015-10-12 ENCOUNTER — Emergency Department (HOSPITAL_COMMUNITY): Payer: Self-pay

## 2015-10-12 ENCOUNTER — Observation Stay (HOSPITAL_COMMUNITY)
Admission: EM | Admit: 2015-10-12 | Discharge: 2015-10-15 | Disposition: A | Discharge status: Home / Self Care | Payer: Self-pay | Attending: Orthopedic Surgery | Admitting: Orthopedic Surgery

## 2015-10-12 ENCOUNTER — Emergency Department (HOSPITAL_COMMUNITY): Admit: 2015-10-12 | Payer: Self-pay

## 2015-10-12 ENCOUNTER — Encounter (HOSPITAL_COMMUNITY): Payer: Self-pay | Admitting: Emergency Medicine

## 2015-10-12 DIAGNOSIS — T148XXA Other injury of unspecified body region, initial encounter: Secondary | ICD-10-CM

## 2015-10-12 DIAGNOSIS — I252 Old myocardial infarction: Secondary | ICD-10-CM | POA: Insufficient documentation

## 2015-10-12 DIAGNOSIS — R7989 Other specified abnormal findings of blood chemistry: Secondary | ICD-10-CM

## 2015-10-12 DIAGNOSIS — Z1619 Resistance to other specified beta lactam antibiotics: Secondary | ICD-10-CM | POA: Insufficient documentation

## 2015-10-12 DIAGNOSIS — M1990 Unspecified osteoarthritis, unspecified site: Secondary | ICD-10-CM | POA: Insufficient documentation

## 2015-10-12 DIAGNOSIS — T814XXA Infection following a procedure, initial encounter: Secondary | ICD-10-CM | POA: Insufficient documentation

## 2015-10-12 DIAGNOSIS — F1410 Cocaine abuse, uncomplicated: Secondary | ICD-10-CM | POA: Diagnosis present

## 2015-10-12 DIAGNOSIS — L089 Local infection of the skin and subcutaneous tissue, unspecified: Secondary | ICD-10-CM

## 2015-10-12 DIAGNOSIS — B999 Unspecified infectious disease: Secondary | ICD-10-CM | POA: Diagnosis present

## 2015-10-12 DIAGNOSIS — F250 Schizoaffective disorder, bipolar type: Secondary | ICD-10-CM | POA: Diagnosis present

## 2015-10-12 DIAGNOSIS — Y9389 Activity, other specified: Secondary | ICD-10-CM | POA: Insufficient documentation

## 2015-10-12 DIAGNOSIS — I2510 Atherosclerotic heart disease of native coronary artery without angina pectoris: Secondary | ICD-10-CM

## 2015-10-12 DIAGNOSIS — S86019A Strain of unspecified Achilles tendon, initial encounter: Secondary | ICD-10-CM | POA: Diagnosis present

## 2015-10-12 DIAGNOSIS — X58XXXA Exposure to other specified factors, initial encounter: Secondary | ICD-10-CM | POA: Insufficient documentation

## 2015-10-12 DIAGNOSIS — Z9114 Patient's other noncompliance with medication regimen: Secondary | ICD-10-CM | POA: Insufficient documentation

## 2015-10-12 DIAGNOSIS — I209 Angina pectoris, unspecified: Secondary | ICD-10-CM

## 2015-10-12 DIAGNOSIS — Z9861 Coronary angioplasty status: Secondary | ICD-10-CM

## 2015-10-12 DIAGNOSIS — I214 Non-ST elevation (NSTEMI) myocardial infarction: Principal | ICD-10-CM | POA: Diagnosis present

## 2015-10-12 DIAGNOSIS — I509 Heart failure, unspecified: Secondary | ICD-10-CM | POA: Insufficient documentation

## 2015-10-12 DIAGNOSIS — M5430 Sciatica, unspecified side: Secondary | ICD-10-CM | POA: Insufficient documentation

## 2015-10-12 DIAGNOSIS — I341 Nonrheumatic mitral (valve) prolapse: Secondary | ICD-10-CM | POA: Insufficient documentation

## 2015-10-12 DIAGNOSIS — R079 Chest pain, unspecified: Secondary | ICD-10-CM

## 2015-10-12 DIAGNOSIS — I119 Hypertensive heart disease without heart failure: Secondary | ICD-10-CM

## 2015-10-12 DIAGNOSIS — B9689 Other specified bacterial agents as the cause of diseases classified elsewhere: Secondary | ICD-10-CM | POA: Insufficient documentation

## 2015-10-12 DIAGNOSIS — Z91018 Allergy to other foods: Secondary | ICD-10-CM | POA: Insufficient documentation

## 2015-10-12 DIAGNOSIS — Z7982 Long term (current) use of aspirin: Secondary | ICD-10-CM | POA: Insufficient documentation

## 2015-10-12 DIAGNOSIS — R9431 Abnormal electrocardiogram [ECG] [EKG]: Secondary | ICD-10-CM

## 2015-10-12 DIAGNOSIS — R778 Other specified abnormalities of plasma proteins: Secondary | ICD-10-CM

## 2015-10-12 DIAGNOSIS — Z955 Presence of coronary angioplasty implant and graft: Secondary | ICD-10-CM | POA: Insufficient documentation

## 2015-10-12 DIAGNOSIS — I110 Hypertensive heart disease with heart failure: Secondary | ICD-10-CM | POA: Insufficient documentation

## 2015-10-12 HISTORY — DX: Acute myocardial infarction, unspecified: I21.9

## 2015-10-12 HISTORY — DX: Cardiac murmur, unspecified: R01.1

## 2015-10-12 HISTORY — DX: Bipolar disorder, unspecified: F31.9

## 2015-10-12 HISTORY — DX: Pure hypercholesterolemia, unspecified: E78.00

## 2015-10-12 HISTORY — DX: Acute embolism and thrombosis of unspecified deep veins of unspecified lower extremity: I82.409

## 2015-10-12 HISTORY — DX: Myiasis of other sites: B87.89

## 2015-10-12 HISTORY — DX: Atherosclerotic heart disease of native coronary artery without angina pectoris: I25.10

## 2015-10-12 HISTORY — DX: Heart failure, unspecified: I50.9

## 2015-10-12 HISTORY — DX: Major depressive disorder, single episode, unspecified: F32.9

## 2015-10-12 HISTORY — DX: Depression, unspecified: F32.A

## 2015-10-12 LAB — CBC
HEMATOCRIT: 44.6 % (ref 39.0–52.0)
HEMOGLOBIN: 14.6 g/dL (ref 13.0–17.0)
MCH: 28.9 pg (ref 26.0–34.0)
MCHC: 32.7 g/dL (ref 30.0–36.0)
MCV: 88.1 fL (ref 78.0–100.0)
Platelets: 310 10*3/uL (ref 150–400)
RBC: 5.06 MIL/uL (ref 4.22–5.81)
RDW: 13.5 % (ref 11.5–15.5)
WBC: 8 10*3/uL (ref 4.0–10.5)

## 2015-10-12 LAB — RAPID URINE DRUG SCREEN, HOSP PERFORMED
AMPHETAMINES: NOT DETECTED
BENZODIAZEPINES: NOT DETECTED
Barbiturates: NOT DETECTED
Cocaine: POSITIVE — AB
OPIATES: NOT DETECTED
Tetrahydrocannabinol: NOT DETECTED

## 2015-10-12 LAB — I-STAT TROPONIN, ED: Troponin i, poc: 0.15 ng/mL (ref 0.00–0.08)

## 2015-10-12 LAB — BASIC METABOLIC PANEL
ANION GAP: 9 (ref 5–15)
BUN: 17 mg/dL (ref 6–20)
CHLORIDE: 105 mmol/L (ref 101–111)
CO2: 25 mmol/L (ref 22–32)
Calcium: 9.7 mg/dL (ref 8.9–10.3)
Creatinine, Ser: 1.2 mg/dL (ref 0.61–1.24)
GFR calc non Af Amer: >60 mL/min (ref >60)
Glucose, Bld: 88 mg/dL (ref 65–99)
POTASSIUM: 3.8 mmol/L (ref 3.5–5.1)
Sodium: 139 mmol/L (ref 135–145)

## 2015-10-12 LAB — BRAIN NATRIURETIC PEPTIDE: B NATRIURETIC PEPTIDE 5: 72 pg/mL (ref 0.0–100.0)

## 2015-10-12 MED ORDER — HEPARIN BOLUS VIA INFUSION
4000.0000 [IU] | Freq: Once | INTRAVENOUS | Status: AC
  Administered 2015-10-12: 4000 [IU] via INTRAVENOUS
  Filled 2015-10-12: qty 4000

## 2015-10-12 MED ORDER — ASPIRIN EC 81 MG PO TBEC
81.0000 mg | DELAYED_RELEASE_TABLET | Freq: Every day | ORAL | Status: DC
  Administered 2015-10-13: 81 mg via ORAL
  Filled 2015-10-12: qty 1

## 2015-10-12 MED ORDER — METOPROLOL TARTRATE 25 MG PO TABS: 25.0000 mg | ORAL_TABLET | Freq: Two times a day (BID) | ORAL | Status: DC

## 2015-10-12 MED ORDER — CARVEDILOL 3.125 MG PO TABS
3.1250 mg | ORAL_TABLET | Freq: Two times a day (BID) | ORAL | Status: DC
  Administered 2015-10-12 – 2015-10-15 (×5): 3.125 mg via ORAL
  Filled 2015-10-12 (×6): qty 1

## 2015-10-12 MED ORDER — NITROGLYCERIN 0.4 MG SL SUBL: 0.4000 mg | SUBLINGUAL_TABLET | SUBLINGUAL | Status: DC | PRN

## 2015-10-12 MED ORDER — ASPIRIN 81 MG PO CHEW: 324.0000 mg | CHEWABLE_TABLET | Freq: Once | ORAL | Status: DC

## 2015-10-12 MED ORDER — HEPARIN (PORCINE) IN NACL 100-0.45 UNIT/ML-% IJ SOLN
1300.0000 [IU]/h | INTRAMUSCULAR | Status: DC
  Administered 2015-10-12: 1300 [IU]/h via INTRAVENOUS
  Filled 2015-10-12: qty 250

## 2015-10-12 MED ORDER — OXYCODONE HCL 5 MG PO TABS
5.0000 mg | ORAL_TABLET | ORAL | Status: DC | PRN
  Administered 2015-10-12 – 2015-10-15 (×14): 10 mg via ORAL
  Filled 2015-10-12 (×13): qty 2

## 2015-10-12 MED ORDER — ACETAMINOPHEN 325 MG PO TABS: 650.0000 mg | ORAL_TABLET | ORAL | Status: DC | PRN

## 2015-10-12 MED ORDER — DOCUSATE SODIUM 100 MG PO CAPS: 100.0000 mg | ORAL_CAPSULE | Freq: Two times a day (BID) | ORAL | Status: DC | PRN

## 2015-10-12 MED ORDER — ATORVASTATIN CALCIUM 40 MG PO TABS
40.0000 mg | ORAL_TABLET | Freq: Every day | ORAL | Status: DC
  Administered 2015-10-12 – 2015-10-13 (×2): 40 mg via ORAL
  Filled 2015-10-12 (×2): qty 1

## 2015-10-12 MED ORDER — METHOCARBAMOL 500 MG PO TABS
500.0000 mg | ORAL_TABLET | Freq: Four times a day (QID) | ORAL | Status: DC | PRN
  Administered 2015-10-13 – 2015-10-15 (×4): 500 mg via ORAL
  Filled 2015-10-12 (×4): qty 1

## 2015-10-12 MED ORDER — METOPROLOL TARTRATE 25 MG PO TABS
50.0000 mg | ORAL_TABLET | Freq: Once | ORAL | Status: AC
  Administered 2015-10-12: 50 mg via ORAL
  Filled 2015-10-12: qty 2

## 2015-10-12 MED ORDER — IOPAMIDOL (ISOVUE-370) INJECTION 76%
INTRAVENOUS | Status: AC
Start: 2015-10-12 — End: 2015-10-12
  Administered 2015-10-12: 100 mL
  Filled 2015-10-12: qty 100

## 2015-10-12 MED ORDER — ONDANSETRON HCL 4 MG/2ML IJ SOLN: 4.0000 mg | Freq: Four times a day (QID) | INTRAMUSCULAR | Status: DC | PRN

## 2015-10-12 NOTE — ED Notes (Signed)
Patient transported to CT 

## 2015-10-12 NOTE — ED Notes (Signed)
Attempted to call report

## 2015-10-12 NOTE — Progress Notes (Signed)
ANTICOAGULATION CONSULT NOTE - Initial Consult  Pharmacy Consult for heparin Indication: chest pain/ACS  Allergies  Allergen Reactions  . 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 Pork-Derived Products Other (See Comments)    Patient preference    Patient Measurements: Height: 6\' 7"  (200.7 cm) Weight: 235 lb (106.6 kg) IBW/kg (Calculated) : 93.7 Heparin Dosing Weight: 106kg  Vital Signs: Temp: 98.2 F (36.8 C) (09/11 1701) Temp Source: Oral (09/11 1701) BP: 134/94 (09/11 1900) Pulse Rate: 79 (09/11 1900)  Labs:  Recent Labs  10/12/15 1712  HGB 14.6  HCT 44.6  PLT 310  CREATININE 1.20    Estimated Creatinine Clearance: 103 mL/min (by C-G formula based on SCr of 1.2 mg/dL).   Medical History: Past Medical History:  Diagnosis Date  . Arthritis   . Hypertension   . Sciatica     Medications:  Infusions:  . heparin      Assessment: 44 yom presented to the ED with CP and SOB. Troponin found to be elevated. CBC is WNL. Pt was supposed to be started on xarelto recently for VTE prophylaxis but pt never started. To start IV heparin for r/o ACS. Of note, pt has a pork-derived product allergy and heparin is a pork-derived drug. MD discussed with the patient and this is not for religious reason but pt claims it makes him sick. MD ok with proceeding with heparin for now.   Goal of Therapy:  Heparin level 0.3-0.7 units/ml Monitor platelets by anticoagulation protocol: Yes   Plan:  - Heparin bolus 4000 units IV x 1 - Heparin gtt 1300 units/hr - Check a 6 hr heparin level - Daily heparin level and CBC  Jumana Paccione, Drake Leachachel Lynn 10/12/2015,7:26 PM

## 2015-10-12 NOTE — ED Triage Notes (Signed)
Pt here from MD Deans office for CP workup and r/o DVT to right lef. Pt also reports SOB.

## 2015-10-12 NOTE — ED Notes (Signed)
Pt states he has no CP and is only here for an US rt DVT. Pt is very upset upon picking up pt from waiting room and is very short and angry. Pt continues to say things that make no sense and states his infection causes his heart attacks, and he has them all the time. Pt states he had a heart attack today lasting 30 minutes and laid on the floor. Pt states he doesn't care about his heart and wants his US. Pt is very adamant about getting his US.

## 2015-10-12 NOTE — H&P (Signed)
CARDIOLOGY CONSULT NOTE   Patient ID: Brett Salinas MRN: [id number], DOB/AGE: [birth year]   Admit date: 10/12/2015 Date of Consult: 10/12/2015   Primary Physician: No PCP Per Patient Primary Cardiologist: new  Pt. Profile  Brett Salinas is a Severely schizophrenic 45 year old African-American male with past medical history of hypertension, bipolar disease, history of psychosis and schizophrenia   Problem List  Past Medical History:  Diagnosis Date  . Arthritis   . Hypertension   . Sciatica     Past Surgical History:  Procedure Laterality Date  . I&D EXTREMITY Right 09/19/2015   Procedure: IRRIGATION AND DEBRIDEMENT ANKLE LACERATIONS POSSIBLE TENDON REPAIR;  Surgeon: Cammy Copa, MD;  Location: [facility] OR;  Service: Orthopedics;  Laterality: Right;     Allergies  Allergies  Allergen Reactions  . 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 Pork-Derived Products Other (See Comments)    Patient preference    HPI   Brett Salinas is a Severely schizophrenic 45 year old African-American male with past medical history of hypertension, bipolar disease, history of psychosis and schizophrenia. He recently underwent Achilles tendon repair on 09/19/2015 and was discharged on 2 weeks of 10mg  daily Xarelto which he finished prior to Labor Day. He has no past cardiac history. He did take blood pressure medication 3 years ago, however self discontinued as he think they were messing with his body (he mentions it messes up his LDL, HDL and such). Since last week, he has had 3 episodes of chest discomfort and shortness of breath. Each lasted roughly 15-20 minutes. He described the chest discomfort as both a squeezing and a stabbing pain. This morning, he had another 20 minutes onset of chest discomfort and the severe shortness of breath. He says it was severe enough for him to lay on the floor for extended period. He did take some aspirin. He was seen by his PCP was concerned about the presence of PE and send  the patient to [facility] for further evaluation.  Initial troponin was elevated at 0.15. Both basic metabolic panel and CBC were normal. Chest x-ray showed no acute etiology. Initial EKG obtained around 5 PM on 10/12/2015 showed normal sinus rhythm without significant ST-T wave changes. However a repeat EKG obtained around 6:40 PM shows significant changes with anterolateral T wave inversion with minimal J-point elevation in V3. Dr. Delton See who was on-call physician discussed the EKG with Dr. Clifton James with interventional team who felt the EKG does not represent STEMI at this point. On further evaluation of the patient in the emergency room, a bedside echocardiogram was obtained which did not reveal obvious ballooning of the heart wall or significant wall motion abnormality. Patient however despite able to carry on a normal conversation, his psychiatric illness presented itself with some obvious inconsistencies in the ways he describes his illness. He mentions the fact that he think his heart attack is triggered by the fact that he has been wearing crutches after his Achilles tendon surgery as he think hitting certain area of body can induce MI. He was also angry with ED staff after they interfered with him sending brain wave to other people in order to take their pain into him.   After discussing with the ED physician, we have decided to admit the patient to cardiology service. Will trend enzyme overnight, obtain CTA of the chest to rule out significant PE and also to assess for coronary calcification. We'll start IV heparin. He is currently chest pain-free, however if he does have chest pain,  may also consider IV nitroglycerin.    Inpatient Medications  . aspirin  324 mg Oral Once  . iopamidol      . metoprolol tartrate  50 mg Oral Once    Family History History reviewed. No pertinent family history.   Social History Social History   Social History  . Marital status: Single    Spouse  name: N/A  . Number of children: N/A  . Years of education: N/A   Occupational History  . Not on file.   Social History Main Topics  . Smoking status: Never Smoker  . Smokeless tobacco: Never Used  . Alcohol use Yes     Comment: occassional  . Drug use: No  . Sexual activity: Not on file   Other Topics Concern  . Not on file   Social History Narrative  . No narrative on file     Review of Systems  General:  No chills, fever, night sweats or weight changes.  Cardiovascular:  No edema, orthopnea, palpitations, paroxysmal nocturnal dyspnea. +chest pain, dyspnea Dermatological: No rash, lesions/masses Respiratory: No cough Urologic: No hematuria, dysuria Abdominal:   No nausea, vomiting, diarrhea, bright red blood per rectum, melena, or hematemesis Neurologic:  No visual changes, wkns, changes in mental status. All other systems reviewed and are otherwise negative except as noted above.  Physical Exam  Blood pressure 141/94, pulse 72, temperature 98.2 F (36.8 C), temperature source Oral, resp. rate 16, height 6\' 7"  (2.007 m), weight 235 lb (106.6 kg), SpO2 100 %.  General: Pleasant, NAD Psych: Normal affect. Neuro: Alert and oriented X 3. Moves all extremities spontaneously. HEENT: Normal  Neck: Supple without bruits or JVD. Lungs:  Resp regular and unlabored, CTA. Heart: RRR no s3, s4, or murmurs. Abdomen: Soft, non-tender, non-distended, BS + x 4.  Extremities: No clubbing, cyanosis or edema. DP/PT/Radials 2+ and equal bilaterally.  Labs  No results for input(s): CKTOTAL, CKMB, TROPONINI in the last 72 hours. Lab Results  Component Value Date   WBC 8.0 10/12/2015   HGB 14.6 10/12/2015   HCT 44.6 10/12/2015   MCV 88.1 10/12/2015   PLT 310 10/12/2015    Recent Labs Lab 10/12/15 1712  NA 139  K 3.8  CL 105  CO2 25  BUN 17  CREATININE 1.20  CALCIUM 9.7  GLUCOSE 88   No results found for: CHOL, HDL, LDLCALC, TRIG No results found for:  DDIMER  Radiology/Studies  Dg Chest 2 View  Result Date: 10/12/2015 CLINICAL DATA:  Chest pain and shortness of breath for 1 day. EXAM: CHEST  2 VIEW COMPARISON:  PA and lateral chest 09/19/2015. FINDINGS: The lungs are clear. No pneumothorax or pleural effusion. Heart size is normal. No focal bony abnormality. IMPRESSION: No acute disease. Electronically Signed   By: Drusilla Kanner M.D.   On: 10/12/2015 18:07   Dg Chest 2 View  Result Date: 09/19/2015 CLINICAL DATA:  Preoperative evaluation for ankle region surgery. Hypertension. EXAM: CHEST  2 VIEW COMPARISON:  None. FINDINGS: Lungs are clear. Heart size and pulmonary vascularity are normal. No adenopathy. No bone lesions. IMPRESSION: No edema or consolidation. Electronically Signed   By: Bretta Bang III M.D.   On: 09/19/2015 14:37   Dg Ankle Complete Right  Result Date: 09/19/2015 CLINICAL DATA:  Laceration posterior ankle region EXAM: RIGHT ANKLE - COMPLETE 3+ VIEW COMPARISON:  None. FINDINGS: Frontal, oblique, and lateral views were obtained. There is extensive soft tissue injury posterior to the ankle joint region. There is  no acute fracture or joint effusion. The ankle mortise appears intact. Calcification anterior to the dorsal distal talus may represent residua of previous trauma. There is no appreciable joint space narrowing or erosion. IMPRESSION: Extensive soft tissue injury posterior to the ankle joint without bony abnormality. No acute fracture or joint effusion. Question residua of old trauma adjacent to the distal talus dorsally. Electronically Signed   By: Bretta BangWilliam  Woodruff III M.D.   On: 09/19/2015 11:53    ECG  EKG obtained at 5 PM and at 6:40 PM showed evolving changes from normal sinus rhythm without any ischemic changes to noticeable T wave inversion in the anterolateral leads with minimal J-point elevation in lead V3.  ASSESSMENT AND PLAN  1. Chest pain with elevated troponin: Unclear if this represented PE  versus ACS.   - Unfortunately patient's psychiatric illness prevent us from getting a curved picture in this case.   - Also, he has self discontinued his blood pressure medication 3 years ago, therefore compliance issue will be a question if he does require any intervention.   - Continue IV heparin for now. Trend enzyme overnight. Obtain complete echocardiogram.  - pending CTA of chest, I have ordered metoprolol tartrate 50mg  to help slow down his HR prior to CTA tonight.  - Per Dr. Delton SeeNelson, the site of echocardiogram did not reveal significant wall motion abnormality or ballooning  2. Schizophrenia: appears to be quite severe  3. History of hypertension, currently not on any antihypertensive medications as he self discontinued them 3 years ago.  Signed, Azalee CourseHao Meng, PA-C 10/12/2015, 558:2918 PM   45 year old male with schizophrenia who underwent Achilles tendon repair on 09/19/15 who was sent by his orthopedist surgeon to the ER to rule out DVT. He states that he has had chest pain 4 x in the last 3 days, 3x today, he states that it was while he was walking and took him to the ground. It was left sided sharp non-radiating, associated with SOB, it resolved in the ER with use of NTG. There is a new ECG change while in the ER, the first one at 16:57 showed SR, LVH, at 18:39 significant negative biphasic T wave in leads V3-4 and with troponin elevation 0.15. Bedside ECG didn't show any significant WMA. He is currently chest pain free. Start ASA, heparin iv, atorvastatin, carvedilol 3.125 mg po BID, lisinopril 2.5 mg po daily.  If the second troponin continues to rise, we will schedule a cath in the am.  Tobias AlexanderKatarina Ndrew Creason, MD 10/12/2015

## 2015-10-12 NOTE — ED Notes (Signed)
Pt admits to having been dx with schizophrenia, psychosis and bipolar disorder. Pt states he was calling people with his brain waves when he heard a phone ringing.Pt states he is taking pain from others and putting it in his body and emitting his spirit into the cosmos.

## 2015-10-12 NOTE — ED Provider Notes (Signed)
[facility]-EMERGENCY DEPT Provider Note   CSN: [id number] Arrival date & time: 10/12/15  1651     History   Chief Complaint Chief Complaint  Patient presents with  . Chest Pain    HPI Brett Salinas is a 45 y.o. male.  HPI   Pt with hx schizophrenia, HTN sent from orthopedic office today for right leg pain, CP, SOB, cough following orthopedic surgery (achilles tendon repair) on 09/24/15.  Pt states he developed anterior chest pain, described as "gripping" that has occurred intermittently, started 4 days after the surgery.  Pain lasts 10-15 minutes at a time.  Associated SOB.  Has had cough that has been productive but no hemoptysis.  States he was supposed to be on xarelto following the surgery but the prescription or coupon were "messed up" and so he has been taking aspirin BID.  Has had continued pain in his incisional area of his right leg and has had wound vac, not on antibiotics.  Dr August Saucer (ortho) saw him today and did not want to start antibiotics, sent to ED given CP, SOB, leg complaints.    Past Medical History:  Diagnosis Date  . Arthritis   . Hypertension   . Sciatica     Patient Active Problem List   Diagnosis Date Noted  . Chest pain 10/12/2015  . Achilles tendon rupture 09/19/2015    Past Surgical History:  Procedure Laterality Date  . I&D EXTREMITY Right 09/19/2015   Procedure: IRRIGATION AND DEBRIDEMENT ANKLE LACERATIONS POSSIBLE TENDON REPAIR;  Surgeon: Cammy Copa, MD;  Location: [facility] OR;  Service: Orthopedics;  Laterality: Right;       Home Medications    Prior to Admission medications   Medication Sig Start Date End Date Taking? Authorizing Provider  aspirin 325 MG tablet Take 650 mg by mouth 2 (two) times daily.   Yes Historical Provider, MD  docusate sodium (COLACE) 100 MG capsule Take 1 capsule (100 mg total) by mouth 2 (two) times daily. Patient taking differently: Take 100 mg by mouth 2 (two) times daily as needed for moderate constipation.   09/24/15  Yes Scott Rise Paganini, MD  methocarbamol (ROBAXIN) 500 MG tablet Take 1 tablet (500 mg total) by mouth every 6 (six) hours as needed for muscle spasms. 09/24/15  Yes Scott Rise Paganini, MD  naproxen sodium (ANAPROX) 220 MG tablet Take 220 mg by mouth 2 (two) times daily as needed (for pain).   Yes Historical Provider, MD  oxyCODONE (OXY IR/ROXICODONE) 5 MG immediate release tablet Take 1-2 tablets (5-10 mg total) by mouth every 3 (three) hours as needed for breakthrough pain. 09/24/15  Yes Scott Rise Paganini, MD  rivaroxaban (XARELTO) 10 MG TABS tablet Take 1 tablet (10 mg total) by mouth daily. Patient not taking: Reported on 10/12/2015 09/24/15   Cammy Copa, MD    Family History History reviewed. No pertinent family history.  Social History Social History  Substance Use Topics  . Smoking status: Never Smoker  . Smokeless tobacco: Never Used  . Alcohol use Yes     Comment: occassional     Allergies   Catfish [fish allergy] and Pork-derived products   Review of Systems Review of Systems  All other systems reviewed and are negative.    Physical Exam Updated Vital Signs BP 128/96   Pulse 83   Temp 98.2 F (36.8 C) (Oral)   Resp 14   Ht 6\' 7"  (2.007 m)   Wt 106.6 kg   SpO2 92%  BMI 26.47 kg/m   Physical Exam  Constitutional: He appears well-developed and well-nourished. No distress.  HENT:  Head: Normocephalic and atraumatic.  Neck: Neck supple.  Cardiovascular: Normal rate and regular rhythm.   Pulmonary/Chest: Effort normal and breath sounds normal. No respiratory distress. He has no wheezes. He has no rales.  Abdominal: Soft. He exhibits no distension and no mass. There is no tenderness. There is no rebound and no guarding.  Musculoskeletal:  Right posterior ankle with surgical incision with small area of dehiscence, ecchymosis surrounding, no discharge.  Tender to palpation throughout.  Right foot is edematous and tender.  Right calf and proximal  leg nontender, no edema, or erythema.    Neurological: He is alert. He exhibits normal muscle tone.  Skin: He is not diaphoretic.  Psychiatric:  Pt tells me his brainwaves called his phone, that a blood clot cannot kill him, laughs and repeats me when I say it kills "people."   Nursing note and vitals reviewed.    ED Treatments / Results  Labs (all labs ordered are listed, but only abnormal results are displayed) Labs Reviewed  URINE RAPID DRUG SCREEN, HOSP PERFORMED - Abnormal; Notable for the following:       Result Value   Cocaine POSITIVE (*)    All other components within normal limits  I-STAT TROPOININ, ED - Abnormal; Notable for the following:    Troponin i, poc 0.15 (*)    All other components within normal limits  BASIC METABOLIC PANEL  CBC  BRAIN NATRIURETIC PEPTIDE  HEPARIN LEVEL (UNFRACTIONATED)  CBC    EKG  EKG Interpretation  Date/Time:  Monday October 12 2015 16:57:16 EDT Ventricular Rate:  73 PR Interval:  164 QRS Duration: 84 QT Interval:  400 QTC Calculation: 440 R Axis:   71 Text Interpretation:  Normal sinus rhythm Nonspecific ST abnormality Abnormal ECG Interpretation limited due to artifact Within the limits of EKG, no acute ST elevations Confirmed by Erma HeritageISAACS MD, Sheria LangAMERON [phone]([id number]) on 10/12/2015 5:53:46 PM       Radiology Dg Chest 2 View  Result Date: 10/12/2015 CLINICAL DATA:  Chest pain and shortness of breath for 1 day. EXAM: CHEST  2 VIEW COMPARISON:  PA and lateral chest 09/19/2015. FINDINGS: The lungs are clear. No pneumothorax or pleural effusion. Heart size is normal. No focal bony abnormality. IMPRESSION: No acute disease. Electronically Signed   By: Drusilla Kannerhomas  Dalessio M.D.   On: 10/12/2015 18:07    Procedures Procedures (including critical care time)  Medications Ordered in ED Medications  aspirin chewable tablet 324 mg (324 mg Oral Not Given 10/12/15 1942)  nitroGLYCERIN (NITROSTAT) SL tablet 0.4 mg (not administered)  heparin ADULT  infusion 100 units/mL (25000 units/23950mL sodium chloride 0.45%) (0 Units/hr Intravenous Paused 10/12/15 2111)  iopamidol (ISOVUE-370) 76 % injection (100 mLs  Contrast Given 10/12/15 2113)  heparin bolus via infusion 4,000 Units (4,000 Units Intravenous Bolus from Bag 10/12/15 1945)  metoprolol tartrate (LOPRESSOR) tablet 50 mg (50 mg Oral Given 10/12/15 2057)     Initial Impression / Assessment and Plan / ED Course  I have reviewed the triage vital signs and the nursing notes.  Pertinent labs & imaging results that were available during my care of the patient were reviewed by me and considered in my medical decision making (see chart for details).  Clinical Course  Comment By Time  Discussed pt with Dr Erma HeritageIsaacs who will also see pt Trixie Dredgemily Osei Anger, PA-C 09/11 1853   CRITICAL CARE Performed by: Shelva MajesticWEST,  Karman Veney   Total critical care time: 30 minutes  Critical care time was exclusive of separately billable procedures and treating other patients.  Critical care was necessary to treat or prevent imminent or life-threatening deterioration.  Critical care was time spent personally by me on the following activities: development of treatment plan with patient and/or surrogate as well as nursing, discussions with consultants, evaluation of patient's response to treatment, examination of patient, obtaining history from patient or surrogate, ordering and performing treatments and interventions, ordering and review of laboratory studies, ordering and review of radiographic studies, pulse oximetry and re-evaluation of patient's condition.     Afebrile nontoxic patient with recent orthopedic surgery c/o intermittent central CP with SOB that occurs with exertion.  Troponin elevated.  EKG with changes.  Discussed pt with Dr Erma Heritage who also saw the patient and consulted cardiology.  Patient discussed with cardiology, pharmacy.  Heparin IV started.  Cardiology accepted patient for admission.  CT angio chest pending at  time of admission.    Final Clinical Impressions(s) / ED Diagnoses   Final diagnoses:  Chest pain, unspecified chest pain type  Elevated troponin  Abnormal EKG    New Prescriptions New Prescriptions   No medications on file     Trixie Dredge, PA-C 10/12/15 2157    Trixie Dredge, PA-C 10/12/15 2158    Shaune Pollack, MD 10/13/15 1404

## 2015-10-12 NOTE — ED Notes (Signed)
Admitting MD at bedside.

## 2015-10-13 ENCOUNTER — Encounter (HOSPITAL_COMMUNITY)
Admission: EM | Disposition: A | Discharge status: Home / Self Care | Payer: Self-pay | Source: Home / Self Care | Attending: Emergency Medicine

## 2015-10-13 ENCOUNTER — Inpatient Hospital Stay (HOSPITAL_COMMUNITY): Payer: Self-pay

## 2015-10-13 ENCOUNTER — Encounter (HOSPITAL_COMMUNITY): Payer: Self-pay | Admitting: General Practice

## 2015-10-13 DIAGNOSIS — I160 Hypertensive urgency: Secondary | ICD-10-CM

## 2015-10-13 DIAGNOSIS — M79609 Pain in unspecified limb: Secondary | ICD-10-CM

## 2015-10-13 DIAGNOSIS — M7989 Other specified soft tissue disorders: Secondary | ICD-10-CM

## 2015-10-13 DIAGNOSIS — I214 Non-ST elevation (NSTEMI) myocardial infarction: Secondary | ICD-10-CM | POA: Diagnosis present

## 2015-10-13 DIAGNOSIS — F1410 Cocaine abuse, uncomplicated: Secondary | ICD-10-CM

## 2015-10-13 HISTORY — PX: CORONARY ANGIOPLASTY WITH STENT PLACEMENT: SHX49

## 2015-10-13 HISTORY — PX: CARDIAC CATHETERIZATION: SHX172

## 2015-10-13 LAB — BASIC METABOLIC PANEL
Anion gap: 8 (ref 5–15)
BUN: 16 mg/dL (ref 6–20)
CHLORIDE: 104 mmol/L (ref 101–111)
CO2: 27 mmol/L (ref 22–32)
CREATININE: 1.21 mg/dL (ref 0.61–1.24)
Calcium: 9.1 mg/dL (ref 8.9–10.3)
GFR calc non Af Amer: >60 mL/min (ref >60)
Glucose, Bld: 94 mg/dL (ref 65–99)
POTASSIUM: 3.6 mmol/L (ref 3.5–5.1)
Sodium: 139 mmol/L (ref 135–145)

## 2015-10-13 LAB — CBC
HEMATOCRIT: 40.1 % (ref 39.0–52.0)
Hemoglobin: 12.8 g/dL — ABNORMAL LOW (ref 13.0–17.0)
MCH: 28.6 pg (ref 26.0–34.0)
MCHC: 31.9 g/dL (ref 30.0–36.0)
MCV: 89.5 fL (ref 78.0–100.0)
PLATELETS: 272 10*3/uL (ref 150–400)
RBC: 4.48 MIL/uL (ref 4.22–5.81)
RDW: 13.6 % (ref 11.5–15.5)
WBC: 5.4 10*3/uL (ref 4.0–10.5)

## 2015-10-13 LAB — TSH: TSH: 0.429 u[IU]/mL (ref 0.350–4.500)

## 2015-10-13 LAB — LIPID PANEL
CHOLESTEROL: 155 mg/dL (ref 0–200)
HDL: 49 mg/dL (ref >40)
LDL Cholesterol: 96 mg/dL (ref 0–99)
TRIGLYCERIDES: 50 mg/dL (ref <150)
Total CHOL/HDL Ratio: 3.2 RATIO
VLDL: 10 mg/dL (ref 0–40)

## 2015-10-13 LAB — MRSA PCR SCREENING: MRSA by PCR: NEGATIVE

## 2015-10-13 LAB — PROTIME-INR
INR: 1.47
Prothrombin Time: 18 seconds — ABNORMAL HIGH (ref 11.4–15.2)

## 2015-10-13 LAB — TROPONIN I
TROPONIN I: 0.13 ng/mL — AB (ref <0.03)
Troponin I: 0.15 ng/mL (ref <0.03)
Troponin I: 0.18 ng/mL (ref <0.03)

## 2015-10-13 LAB — POCT ACTIVATED CLOTTING TIME: Activated Clotting Time: 417 seconds

## 2015-10-13 LAB — APTT: aPTT: 56 seconds — ABNORMAL HIGH (ref 24–36)

## 2015-10-13 SURGERY — LEFT HEART CATH AND CORONARY ANGIOGRAPHY
Anesthesia: LOCAL

## 2015-10-13 SURGERY — LEFT HEART CATH AND CORONARY ANGIOGRAPHY

## 2015-10-13 MED ORDER — ANGIOPLASTY BOOK
Freq: Once | Status: AC
  Administered 2015-10-13: 19:00:00
  Filled 2015-10-13: qty 1

## 2015-10-13 MED ORDER — ENSURE ENLIVE PO LIQD
237.0000 mL | Freq: Two times a day (BID) | ORAL | Status: DC
  Filled 2015-10-13 (×4): qty 237

## 2015-10-13 MED ORDER — LIDOCAINE HCL (PF) 1 % IJ SOLN
INTRAMUSCULAR | Status: AC
  Filled 2015-10-13: qty 30

## 2015-10-13 MED ORDER — FENTANYL CITRATE (PF) 100 MCG/2ML IJ SOLN
INTRAMUSCULAR | Status: DC | PRN
  Administered 2015-10-13: 50 ug via INTRAVENOUS
  Administered 2015-10-13 (×2): 25 ug via INTRAVENOUS

## 2015-10-13 MED ORDER — ASPIRIN 81 MG PO CHEW
81.0000 mg | CHEWABLE_TABLET | Freq: Every day | ORAL | Status: DC
Start: 2015-10-14 — End: 2015-10-13

## 2015-10-13 MED ORDER — TICAGRELOR 90 MG PO TABS
ORAL_TABLET | ORAL | Status: AC
  Filled 2015-10-13: qty 2

## 2015-10-13 MED ORDER — VERAPAMIL HCL 2.5 MG/ML IV SOLN
INTRAVENOUS | Status: AC
  Filled 2015-10-13: qty 2

## 2015-10-13 MED ORDER — BIVALIRUDIN 250 MG IV SOLR
INTRAVENOUS | Status: AC
  Filled 2015-10-13: qty 250

## 2015-10-13 MED ORDER — SODIUM CHLORIDE 0.9% FLUSH: 3.0000 mL | INTRAVENOUS | Status: DC | PRN

## 2015-10-13 MED ORDER — ACETAMINOPHEN 325 MG PO TABS
650.0000 mg | ORAL_TABLET | ORAL | Status: DC | PRN
  Administered 2015-10-13 – 2015-10-14 (×5): 650 mg via ORAL
  Filled 2015-10-13 (×6): qty 2

## 2015-10-13 MED ORDER — BIVALIRUDIN BOLUS VIA INFUSION - CUPID
INTRAVENOUS | Status: DC | PRN
  Administered 2015-10-13: 77.1 mg via INTRAVENOUS

## 2015-10-13 MED ORDER — IOPAMIDOL (ISOVUE-370) INJECTION 76%
INTRAVENOUS | Status: AC
  Filled 2015-10-13: qty 100

## 2015-10-13 MED ORDER — SODIUM CHLORIDE 0.9 % WEIGHT BASED INFUSION
3.0000 mL/kg/h | INTRAVENOUS | Status: DC
  Administered 2015-10-13: 3 mL/kg/h via INTRAVENOUS

## 2015-10-13 MED ORDER — TICAGRELOR 90 MG PO TABS
ORAL_TABLET | ORAL | Status: DC | PRN
  Administered 2015-10-13: 180 mg via ORAL

## 2015-10-13 MED ORDER — SODIUM CHLORIDE 0.9 % WEIGHT BASED INFUSION
1.0000 mL/kg/h | INTRAVENOUS | Status: DC
Start: 2015-10-13 — End: 2015-10-13
  Administered 2015-10-13: 1 mL/kg/h via INTRAVENOUS

## 2015-10-13 MED ORDER — ARGATROBAN 50 MG/50ML IV SOLN
1.0000 ug/kg/min | INTRAVENOUS | Status: DC
  Administered 2015-10-13 (×2): 1 ug/kg/min via INTRAVENOUS
  Filled 2015-10-13 (×2): qty 50

## 2015-10-13 MED ORDER — DIPHENHYDRAMINE HCL 25 MG PO CAPS
25.0000 mg | ORAL_CAPSULE | Freq: Four times a day (QID) | ORAL | Status: DC | PRN
Start: 2015-10-13 — End: 2015-10-15
  Administered 2015-10-13 – 2015-10-15 (×5): 25 mg via ORAL
  Filled 2015-10-13 (×5): qty 1

## 2015-10-13 MED ORDER — SODIUM CHLORIDE 0.9 % IV SOLN: 250.0000 mL | INTRAVENOUS | Status: DC | PRN

## 2015-10-13 MED ORDER — MORPHINE SULFATE (PF) 2 MG/ML IV SOLN
2.0000 mg | Freq: Once | INTRAVENOUS | Status: AC
  Administered 2015-10-13 (×2): 2 mg via INTRAVENOUS
  Filled 2015-10-13: qty 2

## 2015-10-13 MED ORDER — SODIUM CHLORIDE 0.9 % WEIGHT BASED INFUSION: 1.0000 mL/kg/h | INTRAVENOUS | Status: AC

## 2015-10-13 MED ORDER — ASPIRIN 81 MG PO CHEW: 81.0000 mg | CHEWABLE_TABLET | ORAL | Status: AC

## 2015-10-13 MED ORDER — SODIUM CHLORIDE 0.9 % IV SOLN
1.7500 mg/kg/h | INTRAVENOUS | Status: AC
  Filled 2015-10-13: qty 250

## 2015-10-13 MED ORDER — MIDAZOLAM HCL 2 MG/2ML IJ SOLN
INTRAMUSCULAR | Status: AC
  Filled 2015-10-13: qty 2

## 2015-10-13 MED ORDER — CEFTAZIDIME 2 G IJ SOLR
2.0000 g | Freq: Three times a day (TID) | INTRAMUSCULAR | Status: DC
  Administered 2015-10-13 – 2015-10-15 (×5): 2 g via INTRAVENOUS
  Filled 2015-10-13 (×9): qty 2

## 2015-10-13 MED ORDER — IOPAMIDOL (ISOVUE-370) INJECTION 76%
INTRAVENOUS | Status: DC | PRN
  Administered 2015-10-13: 90 mL

## 2015-10-13 MED ORDER — SODIUM CHLORIDE 0.9 % IV SOLN
INTRAVENOUS | Status: DC | PRN
  Administered 2015-10-13 (×2): 1.75 mg/kg/h via INTRAVENOUS

## 2015-10-13 MED ORDER — FENTANYL CITRATE (PF) 100 MCG/2ML IJ SOLN
INTRAMUSCULAR | Status: AC
  Filled 2015-10-13: qty 2

## 2015-10-13 MED ORDER — VANCOMYCIN HCL IN DEXTROSE 1-5 GM/200ML-% IV SOLN
1000.0000 mg | Freq: Three times a day (TID) | INTRAVENOUS | Status: DC
  Administered 2015-10-14 (×2): 1000 mg via INTRAVENOUS
  Filled 2015-10-13 (×4): qty 200

## 2015-10-13 MED ORDER — HEPARIN (PORCINE) IN NACL 2-0.9 UNIT/ML-% IJ SOLN
INTRAMUSCULAR | Status: DC | PRN
  Administered 2015-10-13: 1000 mL

## 2015-10-13 MED ORDER — SODIUM CHLORIDE 0.9% FLUSH: 3.0000 mL | Freq: Two times a day (BID) | INTRAVENOUS | Status: DC

## 2015-10-13 MED ORDER — TICAGRELOR 90 MG PO TABS
90.0000 mg | ORAL_TABLET | Freq: Two times a day (BID) | ORAL | Status: DC
  Administered 2015-10-13 – 2015-10-14 (×2): 90 mg via ORAL
  Filled 2015-10-13 (×4): qty 1

## 2015-10-13 MED ORDER — LIDOCAINE HCL (PF) 1 % IJ SOLN
INTRAMUSCULAR | Status: DC | PRN
  Administered 2015-10-13: 18 mL via SUBCUTANEOUS

## 2015-10-13 MED ORDER — HEART ATTACK BOUNCING BOOK
Freq: Once | Status: AC
  Administered 2015-10-13: 19:00:00
  Filled 2015-10-13: qty 1

## 2015-10-13 MED ORDER — ONDANSETRON HCL 4 MG/2ML IJ SOLN: 4.0000 mg | Freq: Four times a day (QID) | INTRAMUSCULAR | Status: DC | PRN

## 2015-10-13 MED ORDER — MIDAZOLAM HCL 2 MG/2ML IJ SOLN
INTRAMUSCULAR | Status: DC | PRN
  Administered 2015-10-13: 2 mg via INTRAVENOUS

## 2015-10-13 MED ORDER — NITROGLYCERIN 1 MG/10 ML FOR IR/CATH LAB
INTRA_ARTERIAL | Status: AC
  Filled 2015-10-13: qty 10

## 2015-10-13 MED ORDER — ACTIVE PARTNERSHIP FOR HEALTH OF YOUR HEART BOOK
Freq: Once | Status: AC
  Administered 2015-10-13: 19:00:00
  Filled 2015-10-13: qty 1

## 2015-10-13 MED ORDER — THE SENSUOUS HEART BOOK
Freq: Once | Status: AC
  Administered 2015-10-13: 19:00:00
  Filled 2015-10-13: qty 1

## 2015-10-13 MED ORDER — OXYCODONE HCL 5 MG PO TABS
ORAL_TABLET | ORAL | Status: AC
  Filled 2015-10-13: qty 2

## 2015-10-13 MED ORDER — VANCOMYCIN HCL 10 G IV SOLR
1500.0000 mg | Freq: Once | INTRAVENOUS | Status: AC
  Administered 2015-10-13: 1500 mg via INTRAVENOUS
  Filled 2015-10-13: qty 1500

## 2015-10-13 SURGICAL SUPPLY — 22 items
BALLN EMERGE MR 2.5X15 (BALLOONS) ×2
BALLN ~~LOC~~ EMERGE MR 4.0X8 (BALLOONS) ×2
BALLN ~~LOC~~ EMERGE MR 4.5X8 (BALLOONS) ×2
BALLOON EMERGE MR 2.5X15 (BALLOONS) IMPLANT
BALLOON ~~LOC~~ EMERGE MR 4.0X8 (BALLOONS) IMPLANT
BALLOON ~~LOC~~ EMERGE MR 4.5X8 (BALLOONS) IMPLANT
CATH IMPULSE 5F ANG/FL3.5 (CATHETERS) ×1 IMPLANT
CATH OPTICROSS 40MHZ (CATHETERS) ×1 IMPLANT
DEVICE WIRE ANGIOSEAL 6FR (Vascular Products) ×1 IMPLANT
GUIDE CATH RUNWAY 6FR FL4 (CATHETERS) ×1 IMPLANT
KIT ENCORE 26 ADVANTAGE (KITS) ×1 IMPLANT
KIT HEART LEFT (KITS) ×2 IMPLANT
PACK CARDIAC CATHETERIZATION (CUSTOM PROCEDURE TRAY) ×2 IMPLANT
SHEATH PINNACLE 5F 10CM (SHEATH) ×1 IMPLANT
SHEATH PINNACLE 6F 10CM (SHEATH) ×1 IMPLANT
SLED PULL BACK IVUS (MISCELLANEOUS) ×1 IMPLANT
STENT SYNERGY DES 3.5X16 (Permanent Stent) ×1 IMPLANT
TRANSDUCER W/STOPCOCK (MISCELLANEOUS) ×2 IMPLANT
TUBING CIL FLEX 10 FLL-RA (TUBING) ×2 IMPLANT
VALVE GUARDIAN II ~~LOC~~ HEMO (MISCELLANEOUS) ×1 IMPLANT
WIRE ASAHI PROWATER 180CM (WIRE) ×1 IMPLANT
WIRE EMERALD 3MM-J .035X150CM (WIRE) ×1 IMPLANT

## 2015-10-13 NOTE — Interval H&P Note (Signed)
Cath Lab Visit (complete for each Cath Lab visit)  Clinical Evaluation Leading to the Procedure:   ACS: Yes.    Non-ACS:    Anginal Classification: CCS IV  Anti-ischemic medical therapy: Minimal Therapy (1 class of medications)  Non-Invasive Test Results: No non-invasive testing performed  Prior CABG: No previous CABG      History and Physical Interval Note:  10/13/2015 11:28 AM  Brett Salinas  has presented today for surgery, with the diagnosis of NSTEMI  The various methods of treatment have been discussed with the patient and family. After consideration of risks, benefits and other options for treatment, the patient has consented to  Procedure(s): Left Heart Cath and Coronary Angiography (N/A) as a surgical intervention .  The patient's history has been reviewed, patient examined, no change in status, stable for surgery.  I have reviewed the patient's chart and labs.  Questions were answered to the patient's satisfaction.     Lance MussJayadeep Varanasi

## 2015-10-13 NOTE — Progress Notes (Signed)
Patient Name: Brett Salinas Date of Encounter: 10/13/2015  Active Problems:   Chest pain   Length of Stay: 0  SUBJECTIVE  Patient denies chest pain he was only complaining of his leg pain this morning.  CURRENT MEDS . [MAR Hold] aspirin  324 mg Oral Once  . [MAR Hold] aspirin EC  81 mg Oral Daily  . [MAR Hold] atorvastatin  40 mg Oral q1800  . [facility] Hold] carvedilol  3.125 mg Oral BID WC  . sodium chloride flush  3 mL Intravenous Q12H    OBJECTIVE  Vitals:   10/13/15 0500 10/13/15 0800 10/13/15 1022 10/13/15 1133  BP: 118/78 109/76    Pulse: (!) 56 (!) 55    Resp: 16     Temp: 98.3 F (36.8 C)     TempSrc: Oral     SpO2: 100%   100%  Weight: 226 lb 12.8 oz (102.9 kg)  226 lb 10.1 oz (102.8 kg)   Height:        Intake/Output Summary (Last 24 hours) at 10/13/15 1224 Last data filed at 10/13/15 1019  Gross per 24 hour  Intake            20.91 ml  Output              700 ml  Net          -679.09 ml   Filed Weights   10/12/15 1701 10/13/15 0500 10/13/15 1022  Weight: 235 lb (106.6 kg) 226 lb 12.8 oz (102.9 kg) 226 lb 10.1 oz (102.8 kg)    PHYSICAL EXAM  General: Pleasant, NAD. Neuro: Alert and oriented X 3. Moves all extremities spontaneously. Psych: Normal affect. HEENT:  Normal  Neck: Supple without bruits or JVD. Lungs:  Resp regular and unlabored, CTA. Heart: RRR no s3, s4, or murmurs. Abdomen: Soft, non-tender, non-distended, BS + x 4.  Extremities: No clubbing, cyanosis or edema. DP/PT/Radials 2+ and equal bilaterally.  Accessory Clinical Findings  CBC  Recent Labs  10/12/15 1712  WBC 8.0  HGB 14.6  HCT 44.6  MCV 88.1  PLT 310   Basic Metabolic Panel  Recent Labs  10/12/15 1712 10/13/15 0533  NA 139 139  K 3.8 3.6  CL 105 104  CO2 25 27  GLUCOSE 88 94  BUN 17 16  CREATININE 1.20 1.21  CALCIUM 9.7 9.1   Liver Function Tests No results for input(s): AST, ALT, ALKPHOS, BILITOT, PROT, ALBUMIN in the last 72 hours. No results  for input(s): LIPASE, AMYLASE in the last 72 hours. Cardiac Enzymes  Recent Labs  10/12/15 2355 10/13/15 0533 10/13/15 0953  TROPONINI 0.18* 0.15* 0.13*   BNP Invalid input(s): POCBNP D-Dimer No results for input(s): DDIMER in the last 72 hours. Hemoglobin A1C No results for input(s): HGBA1C in the last 72 hours. Fasting Lipid Panel  Recent Labs  10/13/15 0533  CHOL 155  HDL 49  LDLCALC 96  TRIG 50  CHOLHDL 3.2   Thyroid Function Tests  Recent Labs  10/12/15 2355  TSH 0.429    Radiology/Studies  Dg Chest 2 View  Result Date: 10/12/2015 CLINICAL DATA:  Chest pain and shortness of breath for 1 day. EXAM: CHEST  2 VIEW COMPARISON:  PA and lateral chest 09/19/2015. FINDINGS: The lungs are clear. No pneumothorax or pleural effusion. Heart size is normal. No focal bony abnormality. IMPRESSION: No acute disease. Electronically Signed   By: Drusilla Kanner M.D.   On: 10/12/2015 18:07   Dg  Chest 2 View  Result Date: 09/19/2015 CLINICAL DATA:  Preoperative evaluation for ankle region surgery. Hypertension. EXAM: CHEST  2 VIEW COMPARISON:  None. FINDINGS: Lungs are clear. Heart size and pulmonary vascularity are normal. No adenopathy. No bone lesions. IMPRESSION: No edema or consolidation. Electronically Signed   By: Bretta Bang III M.D.   On: 09/19/2015 14:37   Dg Ankle Complete Right  Result Date: 09/19/2015 CLINICAL DATA:  Laceration posterior ankle region EXAM: RIGHT ANKLE - COMPLETE 3+ VIEW COMPARISON:  None. FINDINGS: Frontal, oblique, and lateral views were obtained. There is extensive soft tissue injury posterior to the ankle joint region. There is no acute fracture or joint effusion. The ankle mortise appears intact. Calcification anterior to the dorsal distal talus may represent residua of previous trauma. There is no appreciable joint space narrowing or erosion. IMPRESSION: Extensive soft tissue injury posterior to the ankle joint without bony abnormality. No  acute fracture or joint effusion. Question residua of old trauma adjacent to the distal talus dorsally. Electronically Signed   By: Bretta Bang III M.D.   On: 09/19/2015 11:53   Ct Angio Chest Pe W And/or Wo Contrast  Result Date: 10/12/2015 CLINICAL DATA:  Recent orthopedic surgery. Leg pain, chest pain and shortness of breath. EXAM: CT ANGIOGRAPHY CHEST WITH CONTRAST TECHNIQUE: Multidetector CT imaging of the chest was performed using the standard protocol during bolus administration of intravenous contrast. Multiplanar CT image reconstructions and MIPs were obtained to evaluate the vascular anatomy. CONTRAST:  100 mL Isovue 370 IV COMPARISON:  Chest radiograph 10/12/2015 FINDINGS: Vascular: No pulmonary embolus. The main pulmonary artery is within normal limits for size. No CT evidence of acute right heart strain. Visualized portion of the aorta is normal. Heart size is normal. Mediastinum/Nodes: No mediastinal or axillary adenopathy. Lungs: No pulmonary nodules or masses. No pleural effusion or focal consolidation. Calcified granuloma in the left lower lobe. Visualized abdomen: Contrast bolus timing is not optimized for evaluation of the abdominal organs. There is a hypo attenuating focus within the right hepatic lobe measuring 3.0 x 2.3 centimeters (series 401, image 154). Visualized upper abdominal structures are otherwise unremarkable. Musculoskeletal: No lytic or blastic osseous lesions. The visualized extrathoracic soft tissues are normal. IMPRESSION: 1. No pulmonary embolus. 2. Clear lungs. 3. Hypoattenuating lesion within the right hepatic lobe, incompletely evaluated on this study. This may be a benign lesion such as a hemangioma. Abdominal MRI with and without contrast could be performed for more complete characterization. Electronically Signed   By: Deatra Robinson M.D.   On: 10/12/2015 22:15   TELE: SR  ECG: Sinus rhythm, biphasic T waves in anterolateral leads.   ASSESSMENT AND  PLAN  45 year old male with schizophrenia who underwent Achilles tendon repair on 09/19/15 who was sent by his orthopedist surgeon to the ER to rule out DVT. He states that he has had chest pain 4 x in the last 3 days, 3x today, he states that it was while he was walking and took him to the ground. It was left sided sharp non-radiating, associated with SOB, it resolved in the ER with use of NTG. There is a new ECG change while in the ER, the first one at 16:57 showed SR, LVH, at 18:39 significant negative biphasic T wave in leads V3-4 and with troponin elevation 0.15. Bedside ECG didn't show any significant WMA. He is currently chest pain free. Started ASA, heparin iv, atorvastatin, carvedilol 3.125 mg po BID, lisinopril 2.5 mg po daily.  His  chest CT ruled out pulmonary embolism. History of pontine is very flat 0.18, 0.15, 0.13. His EKG remains having negative T waves that are biphasic in anterolateral leads. He scheduled for cardiac cath today. He has also tested positive for cocaine. I will discontinue beta blocker. 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 If no significant stenosis on We will plan for discharge later today with increased lisinopril dose holding his beta blocker.  Signed, Tobias AlexanderKatarina Tedford Berg MD, [facility] 10/13/2015'

## 2015-10-13 NOTE — Progress Notes (Signed)
VASCULAR LAB PRELIMINARY  PRELIMINARY  PRELIMINARY  PRELIMINARY  Right lower extremity venous duplex completed.    Preliminary report:  Right:  No evidence of DVT, superficial thrombosis, or Baker's cyst.  Lashaundra Lehrmann, RVT 10/13/2015, 7:02 PM

## 2015-10-13 NOTE — Progress Notes (Signed)
Pharmacy Antibiotic Note  Brett Salinas is a 45 y.o. male admitted on 10/12/2015 with wound infection.  Pharmacy has been consulted for Vancomycin and Ceftazidime dosing.  No cultures have been drawn.  Pt s/p recent achilles tendon repair.  Plan: Fortaz 2g IV q8  Vanc 1500mg  IV x 1, then 1000mg  IV q8.   Check VT prior to 4th dose.   Watch renal fxn F/U any culture data  Height: 6\' 7"  (200.7 cm) Weight: 226 lb 10.1 oz (102.8 kg) IBW/kg (Calculated) : 93.7  Temp (24hrs), Avg:98.2 F (36.8 C), Min:97.6 F (36.4 C), Max:99.2 F (37.3 C)   Recent Labs Lab 10/12/15 1712 10/13/15 0533  WBC 8.0 5.4  CREATININE 1.20 1.21    Estimated Creatinine Clearance: 102.2 mL/min (by C-G formula based on SCr of 1.21 mg/dL).    Allergies  Allergen Reactions  . 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 Pork-Derived Products Itching    Burning/itching  . Heparin Itching    Full-body itching inc throat    Antimicrobials this admission: Vanc 9/12 >>  Elita QuickFortaz 9/12 >>   Dose adjustments this admission:   Microbiology results:   Thank you for allowing pharmacy to be a part of this patient's care.  Marisue HumbleKendra Alexanderia Gorby, PharmD Clinical Pharmacist [facility]

## 2015-10-13 NOTE — Care Management Note (Signed)
Case Management Note  Patient Details  Name: Brett Salinas MRN: [id number] Date of Birth: 12/31/1970  Subjective/Objective:     NCM spoke with patient, there was a priest, [name] [phone] in the room with patient. Patient states I can speak while priest is in the room.  Patient states he is homeless and he is living in a hotel right now, the priest will be his transportation from hospital tomorrow back to hotel, he has crutches in the priest's car and his w/chair is at the hotel room.  Patient has no insurance, says he sees Dr. Dorene GrebeScott Dean at [address] [place].  Patient is willing to go to the [facility] clinic, he will call on Monday 9/19  to schedule an hospital follow up , NCM gave him the 30 day savings card for self pay and the application to take to [facility] clinic to get filled out by MD at his follow up apt.  If he has any other meds he can not afford he can get them from clinic, ( except for narcotics).  Patient wanted to speak with CSW about homeless situation, referral made to CSW.                  Action/Plan:   Expected Discharge Date:                  Expected Discharge Plan:  Home/Self Care  In-House Referral:     Discharge planning Services  CM Consult, Medication Assistance, Indigent Health Clinic  Post Acute Care Choice:    Choice offered to:     DME Arranged:    DME Agency:     HH Arranged:    HH Agency:     Status of Service:  In process, will continue to follow  If discussed at Long Length of Stay Meetings, dates discussed:    Additional Comments:  Leone Havenaylor, Lochlyn Zullo Clinton, RN 10/13/2015, 4:28 PM

## 2015-10-13 NOTE — Progress Notes (Signed)
ANTICOAGULATION CONSULT NOTE - Follow Up Consult  Pharmacy Consult for argatroban Indication: chest pain/ACS  Labs:  Recent Labs  10/12/15 1712 10/12/15 2355 10/13/15 0533 10/13/15 0534  HGB 14.6  --   --   --   HCT 44.6  --   --   --   PLT 310  --   --   --   APTT  --   --   --  56*  CREATININE 1.20  --  1.21  --   TROPONINI  --  0.18* 0.15*  --      Assessment/Plan:  45yo male therapeutic on argatroban with initial dosing for CP w/ allergic reaction to heparin; pt reports itching resolved after Benadryl and starting argatroban. Will continue gtt at current rate and confirm stable with additional PTT.   Brett Salinas, PharmD, BCPS  10/13/2015,6:21 AM

## 2015-10-13 NOTE — Accreditation Note (Signed)
After starting argatroban, pt states that he is feeling better and the side effects have gone away. Pt is resting comfortably. Will continue to monitor.   Tera Helperooper, Mychal Durio E

## 2015-10-13 NOTE — Clinical Social Work Note (Signed)
Clinical Social Work Assessment  Patient Details  Name: Brett Salinas MRN: [id number] Date of Birth: 1970/08/16  Date of referral:  10/13/15               Reason for consult:  Housing Concerns/Homelessness                Permission sought to share information with:    Permission granted to share information::  No  Name::        Agency::     Relationship::     Contact Information:     Housing/Transportation Living arrangements for the past 2 months:  Homeless (Patient has been staying in a hotel.) Source of Information:  Patient Patient Interpreter Needed:  None Criminal Activity/Legal Involvement Pertinent to Current Situation/Hospitalization:  No - Comment as needed Significant Relationships:  None Lives with:  Self Do you feel safe going back to the place where you live?  No Need for family participation in patient care:  No (Coment)  Care giving concerns:  The patient voices a lot of frustration regarding his situation. He feels that he is not getting what he needs when discharged from the hospital.   Social Worker assessment / plan:  CSW met with patient at bedside to complete assessment. The patient has readmitted. The patient shares that he has been staying in a hotel since being discharged from last hospitalization. His friend who is a "priest" has been paying for the hotel but is not able to pay past this Friday. The patient states that his plan is to go to a homeless shelter [employer] [facility] [employer]) if he has nowhere else to go. The patient describes the difficulty in finding him placement during last hospitalization. Lack of insurance, criminal history, and substance abuse appear to be the main barriers to placement. The patient also has severe schizophrenia and does not report any outpatient management of this. The patient shares that he spent 15 years in prison after a kidnapping charge. Upon discharge from prison he went to stay with his girlfriend but this did not  work out for him. For some reason this patient does not have Medicaid, but given his SPMI and financial situation, he would likely qualify. CSW has left message with financial counseling requesting that they follow up with patient regarding applying for Medicaid. In addition to applying for Medicaid the patient could benefit from being connected to some kind of outpatient psychiatric treatment or an ACT team. CSW notes that the patient is positive for cocaine, unfortunately this was noticed after the patient's assessment and the patient did not endorse this during the assessment. Resources will need to be provided. The patient also presents with some bizarre beliefs about the reason for his "heart attack." He states "I am an idiopath and I take suffering away from others." He believes that his heart is being negatively affected by his association with the priest that is helping him because the priest has a heart condition. The patient would benefit from education on the effects cocaine is having on his cardiovascular health. The patient says "they want me to take medication and get stents but I'm not doing any of that." CSW will leave handoff report for CSW covering the unit that the patient will transfer to [facility]).   Employment status:  Unemployed Forensic scientist:  Self Pay (Medicaid Pending) PT Recommendations:  Not assessed at this time Information / Referral to community resources:  Other (Comment Required) (Unclear if the patient will need  placement will need to continue to follow.)  Patient/Family's Response to care:  The patient voices a lot of frustration regarding being given heparin due to the fact that it is derived from pork, which he states he is allergic to. This issue has been resolved. He also voices frustration with not getting the care he thinks he needs when he has been discharged in the past.  Patient/Family's Understanding of and Emotional Response to Diagnosis, Current Treatment,  and Prognosis:  As stated, the patient presents with bizarre beliefs about his current condition. CSW suspects that the patient will be non-compliant with treatment given his plans to not take recommended medications.   Emotional Assessment Appearance:  Appears stated age Attitude/Demeanor/Rapport:  Other (Patient is appropriate with CSW.) Affect (typically observed):  Accepting, Appropriate Orientation:  Oriented to Self, Oriented to Place, Oriented to  Time, Oriented to Situation Alcohol / Substance use:  Illicit Drugs (Drug test is positive for cocaine.) Psych involvement (Current and /or in the community):  No (Comment)  Discharge Needs  Concerns to be addressed:  Mental Health Concerns, Discharge Planning Concerns, Homelessness, Substance Abuse Concerns Readmission within the last 30 days:  Yes Current discharge risk:  Substance Abuse, Lives alone, Lack of support system, Homeless, Inadequate Financial Supports, Psychiatric Illness Barriers to Discharge:  Continued Medical Work up   Rigoberto Noel, LCSW 10/13/2015, 9:22 PM

## 2015-10-13 NOTE — Significant Event (Signed)
Patient developed itching that patient attributes to pork components of heparin. His listed allergy to pork derived products is reportedly due to "patient preference" and not a specific reaction. Feels a bit hot. No rash. No respiratory complaints. I spoke with pharmacy about non-pork derived options. The two options are fondaparinux and argatroban. Since the patient may undergo a cardiac cath and fonda is associated with increased risk for catheter related thrombosis, we are left with argatroban. Pharmacy consult for argatroban ordered.  Oral benadryl ordered for the patient's itching.

## 2015-10-13 NOTE — Progress Notes (Signed)
Left leg possible infection Koreas neg for dvt Stent placed On brelenta Needs I and d tomorrow Cultures obtained Will start vanc and ceftaz tonight

## 2015-10-13 NOTE — Progress Notes (Signed)
Called in by pt and pt asked if Heparin contained pork. I said yes it does. Pt states that he is allergic to pork and states that he is burning up, itching and has a scratchy throat. Turned of heparin drip. Temp on exam on 97.7. Lungs were clear. Called on call cardiologist, Dr. Zachery ConchFriedman and got orders for Benadryl and orders for argatroban. Will continue to monitor.  Tera Helperooper, Smith Potenza E

## 2015-10-13 NOTE — Progress Notes (Signed)
ANTICOAGULATION CONSULT NOTE - Initial Consult  Pharmacy Consult for argatroban Indication: chest pain/ACS  Allergies  Allergen Reactions  . 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 Pork-Derived Products Other (See Comments)    Patient preference    Patient Measurements: Height: 6\' 7"  (200.7 cm) Weight: 235 lb (106.6 kg) IBW/kg (Calculated) : 93.7  Vital Signs: Temp: 99.2 F (37.3 C) (09/11 2340) Temp Source: Oral (09/11 2340) BP: 120/79 (09/11 2340) Pulse Rate: 65 (09/11 2340)  Labs:  Recent Labs  10/12/15 1712 10/12/15 2355  HGB 14.6  --   HCT 44.6  --   PLT 310  --   CREATININE 1.20  --   TROPONINI  --  0.18*    Estimated Creatinine Clearance: 103 mL/min (by C-G formula based on SCr of 1.2 mg/dL).   Medical History: Past Medical History:  Diagnosis Date  . Arthritis   . Hypertension   . Sciatica     Medications:  Prescriptions Prior to Admission  Medication Sig Dispense Refill Last Dose  . aspirin 325 MG tablet Take 650 mg by mouth 2 (two) times daily.   10/12/2015 at am  . docusate sodium (COLACE) 100 MG capsule Take 1 capsule (100 mg total) by mouth 2 (two) times daily. (Patient taking differently: Take 100 mg by mouth 2 (two) times daily as needed for moderate constipation. ) 10 capsule 0 Past Week at Unknown time  . methocarbamol (ROBAXIN) 500 MG tablet Take 1 tablet (500 mg total) by mouth every 6 (six) hours as needed for muscle spasms. 30 tablet 0 Past Week at Unknown time  . naproxen sodium (ANAPROX) 220 MG tablet Take 220 mg by mouth 2 (two) times daily as needed (for pain).   10/11/2015 at Unknown time  . oxyCODONE (OXY IR/ROXICODONE) 5 MG immediate release tablet Take 1-2 tablets (5-10 mg total) by mouth every 3 (three) hours as needed for breakthrough pain. 30 tablet 0 Past Week at Unknown time  . rivaroxaban (XARELTO) 10 MG TABS tablet Take 1 tablet (10 mg total) by mouth daily. (Patient not taking: Reported on 10/12/2015) 30 tablet 0 Not Taking at  Unknown time   Scheduled:  . aspirin  324 mg Oral Once  . aspirin EC  81 mg Oral Daily  . atorvastatin  40 mg Oral q1800  . carvedilol  3.125 mg Oral BID WC    Assessment: 45yo male w/ psychiatric hx was started on heparin for possible ACS (PE ruled out by CTA, was sent to ED for eval for possible DVT), pork allergy listed in chart but as "pt preference"; overnight pt c/o itching all over his body from the pork-derived heparin, now to transition to argatroban.  Goal of Therapy:  aPTT 50-90 seconds Monitor platelets by anticoagulation protocol: Yes   Plan:  Will d/c heparin and start argatroban at 1 mcg/kg/min and monitor aPTT and CBC.  Vernard GamblesVeronda Layken Beg, PharmD, BCPS  10/13/2015,1:50 AM

## 2015-10-13 NOTE — Progress Notes (Signed)
Post procedure patient complaining of shooting pain, 10/10 to right groin insertion site.  Brett Demarkhonda Barrett, PA aware.  Partial pain control achieved at this time with medications.  Site shows no bleeding, bruising, or swelling, but is very tender to palp.

## 2015-10-14 ENCOUNTER — Inpatient Hospital Stay (HOSPITAL_COMMUNITY): Payer: Self-pay | Admitting: Anesthesiology

## 2015-10-14 ENCOUNTER — Inpatient Hospital Stay (HOSPITAL_COMMUNITY): Payer: Self-pay

## 2015-10-14 ENCOUNTER — Encounter (HOSPITAL_COMMUNITY)
Admission: EM | Disposition: A | Discharge status: Home / Self Care | Payer: Self-pay | Source: Home / Self Care | Attending: Emergency Medicine

## 2015-10-14 ENCOUNTER — Encounter (HOSPITAL_COMMUNITY): Payer: Self-pay | Admitting: Interventional Cardiology

## 2015-10-14 DIAGNOSIS — I2510 Atherosclerotic heart disease of native coronary artery without angina pectoris: Secondary | ICD-10-CM

## 2015-10-14 DIAGNOSIS — Z9861 Coronary angioplasty status: Secondary | ICD-10-CM

## 2015-10-14 DIAGNOSIS — F250 Schizoaffective disorder, bipolar type: Secondary | ICD-10-CM

## 2015-10-14 DIAGNOSIS — R079 Chest pain, unspecified: Secondary | ICD-10-CM

## 2015-10-14 DIAGNOSIS — F1410 Cocaine abuse, uncomplicated: Secondary | ICD-10-CM

## 2015-10-14 DIAGNOSIS — L089 Local infection of the skin and subcutaneous tissue, unspecified: Secondary | ICD-10-CM

## 2015-10-14 DIAGNOSIS — T148XXA Other injury of unspecified body region, initial encounter: Secondary | ICD-10-CM

## 2015-10-14 DIAGNOSIS — B999 Unspecified infectious disease: Secondary | ICD-10-CM | POA: Diagnosis present

## 2015-10-14 HISTORY — DX: Atherosclerotic heart disease of native coronary artery without angina pectoris: I25.10

## 2015-10-14 HISTORY — DX: Cocaine abuse, uncomplicated: F14.10

## 2015-10-14 HISTORY — PX: I & D EXTREMITY: SHX5045

## 2015-10-14 HISTORY — DX: Schizoaffective disorder, bipolar type: F25.0

## 2015-10-14 LAB — BASIC METABOLIC PANEL
ANION GAP: 6 (ref 5–15)
BUN: 15 mg/dL (ref 6–20)
CALCIUM: 8.4 mg/dL — AB (ref 8.9–10.3)
CO2: 26 mmol/L (ref 22–32)
Chloride: 105 mmol/L (ref 101–111)
Creatinine, Ser: 1.18 mg/dL (ref 0.61–1.24)
GFR calc non Af Amer: >60 mL/min (ref >60)
Glucose, Bld: 100 mg/dL — ABNORMAL HIGH (ref 65–99)
POTASSIUM: 3.6 mmol/L (ref 3.5–5.1)
Sodium: 137 mmol/L (ref 135–145)

## 2015-10-14 LAB — CBC
HEMATOCRIT: 38.9 % — AB (ref 39.0–52.0)
HEMOGLOBIN: 12 g/dL — AB (ref 13.0–17.0)
MCH: 27.7 pg (ref 26.0–34.0)
MCHC: 30.8 g/dL (ref 30.0–36.0)
MCV: 89.8 fL (ref 78.0–100.0)
Platelets: 233 10*3/uL (ref 150–400)
RBC: 4.33 MIL/uL (ref 4.22–5.81)
RDW: 13.6 % (ref 11.5–15.5)
WBC: 4.7 10*3/uL (ref 4.0–10.5)

## 2015-10-14 LAB — ECHOCARDIOGRAM COMPLETE
Height: 79 in
WEIGHTICAEL: 3626.13 [oz_av]

## 2015-10-14 LAB — HEMOGLOBIN A1C
Hgb A1c MFr Bld: 5.6 % (ref 4.8–5.6)
Mean Plasma Glucose: 114 mg/dL

## 2015-10-14 SURGERY — IRRIGATION AND DEBRIDEMENT EXTREMITY
Anesthesia: General | Laterality: Right

## 2015-10-14 MED ORDER — ACETAMINOPHEN 650 MG RE SUPP: 650.0000 mg | Freq: Four times a day (QID) | RECTAL | Status: DC | PRN

## 2015-10-14 MED ORDER — SODIUM CHLORIDE 0.9 % IR SOLN
Status: DC | PRN
Start: 2015-10-14 — End: 2015-10-14
  Administered 2015-10-14: 1000 mL

## 2015-10-14 MED ORDER — ACETAMINOPHEN 325 MG PO TABS
650.0000 mg | ORAL_TABLET | Freq: Four times a day (QID) | ORAL | Status: DC | PRN
  Administered 2015-10-14 – 2015-10-15 (×2): 650 mg via ORAL
  Filled 2015-10-14: qty 2

## 2015-10-14 MED ORDER — PHENOL 1.4 % MT LIQD
1.0000 | OROMUCOSAL | Status: DC | PRN
  Administered 2015-10-14: 1 via OROMUCOSAL
  Filled 2015-10-14: qty 177

## 2015-10-14 MED ORDER — METOCLOPRAMIDE HCL 5 MG/ML IJ SOLN: 5.0000 mg | Freq: Three times a day (TID) | INTRAMUSCULAR | Status: DC | PRN

## 2015-10-14 MED ORDER — FENTANYL CITRATE (PF) 100 MCG/2ML IJ SOLN
INTRAMUSCULAR | Status: AC
  Filled 2015-10-14: qty 4

## 2015-10-14 MED ORDER — ONDANSETRON HCL 4 MG/2ML IJ SOLN: 4.0000 mg | Freq: Four times a day (QID) | INTRAMUSCULAR | Status: DC | PRN

## 2015-10-14 MED ORDER — PHENYLEPHRINE HCL 10 MG/ML IJ SOLN
INTRAMUSCULAR | Status: DC | PRN
  Administered 2015-10-14 (×3): 80 ug via INTRAVENOUS

## 2015-10-14 MED ORDER — PROPOFOL 10 MG/ML IV BOLUS
INTRAVENOUS | Status: DC | PRN
  Administered 2015-10-14: 150 mg via INTRAVENOUS
  Administered 2015-10-14: 50 mg via INTRAVENOUS

## 2015-10-14 MED ORDER — HYDROMORPHONE HCL 1 MG/ML IJ SOLN
1.0000 mg | INTRAMUSCULAR | Status: DC | PRN
  Administered 2015-10-14 – 2015-10-15 (×6): 1 mg via INTRAVENOUS
  Filled 2015-10-14 (×6): qty 1

## 2015-10-14 MED ORDER — HYDROMORPHONE HCL 1 MG/ML IJ SOLN
INTRAMUSCULAR | Status: AC
Start: 2015-10-14 — End: 2015-10-15
  Filled 2015-10-14: qty 1

## 2015-10-14 MED ORDER — FENTANYL CITRATE (PF) 100 MCG/2ML IJ SOLN
INTRAMUSCULAR | Status: DC | PRN
  Administered 2015-10-14: 50 ug via INTRAVENOUS

## 2015-10-14 MED ORDER — LIDOCAINE HCL (CARDIAC) 20 MG/ML IV SOLN
INTRAVENOUS | Status: DC | PRN
  Administered 2015-10-14: 100 mg via INTRAVENOUS

## 2015-10-14 MED ORDER — METOCLOPRAMIDE HCL 5 MG PO TABS: 5.0000 mg | ORAL_TABLET | Freq: Three times a day (TID) | ORAL | Status: DC | PRN

## 2015-10-14 MED ORDER — MIDAZOLAM HCL 2 MG/2ML IJ SOLN
INTRAMUSCULAR | Status: AC
  Filled 2015-10-14: qty 2

## 2015-10-14 MED ORDER — PHENYLEPHRINE HCL 10 MG/ML IJ SOLN
INTRAVENOUS | Status: DC | PRN
  Administered 2015-10-14: 50 ug/min via INTRAVENOUS

## 2015-10-14 MED ORDER — ONDANSETRON HCL 4 MG PO TABS: 4.0000 mg | ORAL_TABLET | Freq: Four times a day (QID) | ORAL | Status: DC | PRN

## 2015-10-14 MED ORDER — ROCURONIUM BROMIDE 100 MG/10ML IV SOLN
INTRAVENOUS | Status: DC | PRN
  Administered 2015-10-14: 25 mg via INTRAVENOUS
  Administered 2015-10-14: 15 mg via INTRAVENOUS

## 2015-10-14 MED ORDER — OXYCODONE HCL 5 MG/5ML PO SOLN: 10.0000 mg | Freq: Once | ORAL | Status: DC | PRN

## 2015-10-14 MED ORDER — OXYCODONE HCL 5 MG PO TABS: 10.0000 mg | ORAL_TABLET | Freq: Once | ORAL | Status: DC | PRN

## 2015-10-14 MED ORDER — ASPIRIN EC 81 MG PO TBEC
81.0000 mg | DELAYED_RELEASE_TABLET | Freq: Every day | ORAL | Status: DC
  Administered 2015-10-14: 81 mg via ORAL
  Filled 2015-10-14 (×3): qty 1

## 2015-10-14 MED ORDER — PROPOFOL 10 MG/ML IV BOLUS
INTRAVENOUS | Status: AC
  Filled 2015-10-14: qty 20

## 2015-10-14 MED ORDER — SUCCINYLCHOLINE CHLORIDE 20 MG/ML IJ SOLN
INTRAMUSCULAR | Status: DC | PRN
  Administered 2015-10-14: 100 mg via INTRAVENOUS

## 2015-10-14 MED ORDER — HYDROMORPHONE HCL 1 MG/ML IJ SOLN
0.2500 mg | INTRAMUSCULAR | Status: DC | PRN
  Administered 2015-10-14 (×2): 0.5 mg via INTRAVENOUS

## 2015-10-14 MED ORDER — PROMETHAZINE HCL 25 MG/ML IJ SOLN
6.2500 mg | INTRAMUSCULAR | Status: DC | PRN
Start: 2015-10-14 — End: 2015-10-14

## 2015-10-14 MED FILL — Nitroglycerin IV Soln 100 MCG/ML in D5W: INTRA_ARTERIAL | Qty: 10 | Status: AC

## 2015-10-14 MED FILL — Verapamil HCl IV Soln 2.5 MG/ML: INTRAVENOUS | Qty: 2 | Status: AC

## 2015-10-14 SURGICAL SUPPLY — 39 items
APL SKNCLS STERI-STRIP NONHPOA (GAUZE/BANDAGES/DRESSINGS) ×1
BANDAGE ELASTIC 4 VELCRO ST LF (GAUZE/BANDAGES/DRESSINGS) ×4 IMPLANT
BENZOIN TINCTURE PRP APPL 2/3 (GAUZE/BANDAGES/DRESSINGS) ×2 IMPLANT
COVER SURGICAL LIGHT HANDLE (MISCELLANEOUS) ×3 IMPLANT
DRAPE U-SHAPE 47X51 STRL (DRAPES) ×3 IMPLANT
DURAPREP 26ML APPLICATOR (WOUND CARE) ×3 IMPLANT
ELECT CAUTERY BLADE 6.4 (BLADE) ×2 IMPLANT
ELECT REM PT RETURN 9FT ADLT (ELECTROSURGICAL) ×3
ELECTRODE REM PT RTRN 9FT ADLT (ELECTROSURGICAL) IMPLANT
FACESHIELD WRAPAROUND (MASK) ×3 IMPLANT
FACESHIELD WRAPAROUND OR TEAM (MASK) ×1 IMPLANT
GAUZE SPONGE 4X4 12PLY STRL (GAUZE/BANDAGES/DRESSINGS) ×2 IMPLANT
GLOVE BIOGEL PI IND STRL 8 (GLOVE) ×1 IMPLANT
GLOVE BIOGEL PI INDICATOR 8 (GLOVE) ×2
GLOVE SURG ORTHO 8.0 STRL STRW (GLOVE) ×3 IMPLANT
GOWN STRL REUS W/ TWL LRG LVL3 (GOWN DISPOSABLE) ×2 IMPLANT
GOWN STRL REUS W/ TWL XL LVL3 (GOWN DISPOSABLE) ×1 IMPLANT
GOWN STRL REUS W/TWL LRG LVL3 (GOWN DISPOSABLE) ×6
GOWN STRL REUS W/TWL XL LVL3 (GOWN DISPOSABLE) ×3
KIT BASIN OR (CUSTOM PROCEDURE TRAY) ×3 IMPLANT
KIT PREVENA INCISION MGT 13 (CANNISTER) ×2 IMPLANT
KIT ROOM TURNOVER OR (KITS) ×3 IMPLANT
MANIFOLD NEPTUNE II (INSTRUMENTS) ×3 IMPLANT
NS IRRIG 1000ML POUR BTL (IV SOLUTION) ×3 IMPLANT
PACK ORTHO EXTREMITY (CUSTOM PROCEDURE TRAY) ×3 IMPLANT
PAD ARMBOARD 7.5X6 YLW CONV (MISCELLANEOUS) ×6 IMPLANT
SCRUB PCMX 4 OZ (MISCELLANEOUS) ×2 IMPLANT
SPONGE LAP 18X18 X RAY DECT (DISPOSABLE) ×9 IMPLANT
SPONGE LAP 4X18 X RAY DECT (DISPOSABLE) ×3 IMPLANT
STOCKINETTE IMPERVIOUS 9X36 MD (GAUZE/BANDAGES/DRESSINGS) ×3 IMPLANT
SWAB COLLECTION DEVICE MRSA (MISCELLANEOUS) ×2 IMPLANT
SWAB CULTURE ESWAB REG 1ML (MISCELLANEOUS) ×2 IMPLANT
TOWEL OR 17X24 6PK STRL BLUE (TOWEL DISPOSABLE) ×3 IMPLANT
TOWEL OR 17X26 10 PK STRL BLUE (TOWEL DISPOSABLE) ×3 IMPLANT
TUBE CONNECTING 12'X1/4 (SUCTIONS) ×1
TUBE CONNECTING 12X1/4 (SUCTIONS) ×2 IMPLANT
UNDERPAD 30X30 (UNDERPADS AND DIAPERS) ×3 IMPLANT
WATER STERILE IRR 1000ML POUR (IV SOLUTION) ×3 IMPLANT
YANKAUER SUCT BULB TIP NO VENT (SUCTIONS) ×3 IMPLANT

## 2015-10-14 NOTE — Anesthesia Postprocedure Evaluation (Signed)
Anesthesia Post Note  Patient: Brett Salinas  Procedure(s) Performed: Procedure(s) (LRB): IRRIGATION AND DEBRIDEMENT EXTREMITY/Right Ankle (Right)  Patient location during evaluation: PACU Anesthesia Type: General Level of consciousness: awake and alert Pain management: pain level controlled Vital Signs Assessment: post-procedure vital signs reviewed and stable Respiratory status: spontaneous breathing, nonlabored ventilation, respiratory function stable and patient connected to nasal cannula oxygen Cardiovascular status: blood pressure returned to baseline and stable Postop Assessment: no signs of nausea or vomiting Anesthetic complications: no    Last Vitals:  Vitals:   10/14/15 1945 10/14/15 2014  BP:  (!) 139/93  Pulse: 60 66  Resp: 10 11  Temp:  36.8 C    Last Pain:  Vitals:   10/14/15 2053  TempSrc:   PainSc: 10-Worst pain ever                 Kindal Ponti,W. EDMOND

## 2015-10-14 NOTE — Transfer of Care (Signed)
Immediate Anesthesia Transfer of Care Note  Patient: Brett Salinas  Procedure(s) Performed: Procedure(s): IRRIGATION AND DEBRIDEMENT EXTREMITY/Right Ankle (Right)  Patient Location: PACU  Anesthesia Type:General  Level of Consciousness: awake, alert  and oriented  Airway & Oxygen Therapy: Patient Spontanous Breathing and Patient connected to nasal cannula oxygen  Post-op Assessment: Report given to RN and Post -op Vital signs reviewed and stable  Post vital signs: Reviewed and stable  Last Vitals:  Vitals:   10/14/15 1651 10/14/15 1800  BP: (!) 134/103   Pulse: 60   Resp:    Temp:  36.3 C    Last Pain:  Vitals:   10/14/15 1820  TempSrc:   PainSc: 10-Worst pain ever      Patients Stated Pain Goal: 0 (10/14/15 1220)  Complications: No apparent anesthesia complications

## 2015-10-14 NOTE — Brief Op Note (Signed)
10/12/2015 - 10/14/2015  5:52 PM  PATIENT:  Brett Salinas  45 y.o. male  PRE-OPERATIVE DIAGNOSIS:  Infected Right Ankle  POST-OPERATIVE DIAGNOSIS:  Infected Right Ankle  PROCEDURE:  Procedure(s): IRRIGATION AND DEBRIDEMENT EXTREMITY/Right Ankle  SURGEON:  Surgeon(s): Cammy CopaScott Anndee Connett, MD  ASSISTANT: none  ANESTHESIA:   general  EBL: 15 ml    Total I/O In: 614 [P.O.:240; I.V.:74; IV Piggyback:300] Out: 900 [Urine:900]  BLOOD ADMINISTERED: none  DRAINS: provena vac   LOCAL MEDICATIONS USED:  none  SPECIMEN:  Cultures x 2  COUNTS:  YES  TOURNIQUET:  * No tourniquets in log *  DICTATION: .Other Dictation: Dictation Number [id number]) [id number]  PLAN OF CARE: Admit for overnight observation  PATIENT DISPOSITION:  PACU - hemodynamically stable

## 2015-10-14 NOTE — Anesthesia Preprocedure Evaluation (Addendum)
Anesthesia Evaluation  Patient identified by MRN, date of birth, ID band Patient awake    Reviewed: Allergy & Precautions, H&P , NPO status , Patient's Chart, lab work & pertinent test results  History of Anesthesia Complications Negative for: history of anesthetic complications  Airway Mallampati: II  TM Distance: >3 FB Neck ROM: full    Dental  (+) Poor Dentition   Pulmonary neg pulmonary ROS,    Pulmonary exam normal breath sounds clear to auscultation       Cardiovascular hypertension, + CAD, + Past MI, + Cardiac Stents and +CHF  Normal cardiovascular exam+ Valvular Problems/Murmurs MVP  Rhythm:regular Rate:Normal     Neuro/Psych PSYCHIATRIC DISORDERS Depression Bipolar Disorder Schizophrenia negative neurological ROS     GI/Hepatic negative GI ROS, (+)     substance abuse  cocaine use,   Endo/Other  negative endocrine ROS  Renal/GU negative Renal ROS     Musculoskeletal  (+) Arthritis ,   Abdominal   Peds  Hematology negative hematology ROS (+)   Anesthesia Other Findings Patient literally had an MI 2 days ago and a DES to his LAD placed yesterday and is here for urgent surgery that can not wait the recommended time frame of at least 6 weeks,, He is on cardiology service and they have cleared him as long as he continues aspirin and brilinta and his BB.. I will give aspirin and BB here in preop to keep him on schedule, he has taken his brilinta today  Reproductive/Obstetrics negative OB ROS                            Anesthesia Physical Anesthesia Plan  ASA: IV  Anesthesia Plan: General   Post-op Pain Management:    Induction: Intravenous  Airway Management Planned: Oral ETT  Additional Equipment: None  Intra-op Plan:   Post-operative Plan: Extubation in OR  Informed Consent: I have reviewed the patients History and Physical, chart, labs and discussed the procedure  including the risks, benefits and alternatives for the proposed anesthesia with the patient or authorized representative who has indicated his/her understanding and acceptance.   Dental Advisory Given  Plan Discussed with: CRNA and Anesthesiologist  Anesthesia Plan Comments: (Fresh stent to LAD yesterday.. Moderate to high risk from cardiac standpoint for elective surgery per AHA/ACC Guidelines)       Anesthesia Quick Evaluation

## 2015-10-14 NOTE — Progress Notes (Signed)
  Echocardiogram 2D Echocardiogram has been performed.  Leta JunglingCooper, Nazyia Gaugh M 10/14/2015, 8:31 AM

## 2015-10-14 NOTE — Progress Notes (Signed)
Patient assessed by Roddie McBryant Campbell, LCSW on yesterday. This CSW engaged with Patient at his bedside to discuss social issues and provide further resources. CSW provided Patient with homelessness resources including boarding homes, extended stay hotels, transitional housing, and local shelters. Patient reports that he does not have any income and reports that his "priest" has been paying for him to stay at the hotel but is unable to pay past Friday. Patient reports that he spoke with a "Judithann Saugerorsha" who is working on housing for him as well as working on [employer] and disability. Patient reports that his priest will be able to come and pick him up at discharge and will take him to a local shelter. CSW explained the orange card and [facility] CSW provided Patient with the hospital liaison's contact information and advised him to call her to schedule a meeting time to complete the application. CSW provided Patient with [facility] brochure. CSW also provided Patient with information regarding [facility] Open Access and ACTT Team. Patient reports that he was diagnosed with a "psychotic disorder" while in prison and took medications for about 6 years but stopped as he does not want to take "those medications". Initially [facility] that they could not see him if he was on crutches or in a wheelchair. CSW explained to Patient that they could not refuse to see a person due to being on crutches or in a wheelchair and CSW called [facility] on speaker who confirmed that they see persons in wheelchairs and who use crutches and they are unsure of where Patient received that information from. Patient agreeable to mental health resources and reports that [facility] even if it is just for outpatient therapy vs. Medication management. Patient became very defensive when discussing his toxicology report and reports that he has not used/abused cocaine and  does not have substance abuse problems. He reports that the test should have tested positive for opiates as he is prescribed them for pain but notes that the toxicology report tested negative and he feels it was a faulty test. Patient accepted substance abuse resources and CSW discussed the effects of cocaine and alcohol on Patient's physical and mental health. Patient appreciative of resources provided and denies any further concerns. CSW following for disposition.          Lance MussAshley Gardner,MSW, LCSW [facility] Clinical Social Worker [phone]

## 2015-10-14 NOTE — Progress Notes (Signed)
Subjective:  C/O pain RFA site and Rt achilles  Objective:  Vital Signs in the last 24 hours: Temp:  [97.6 F (36.4 C)-98.5 F (36.9 C)] 98.1 F (36.7 C) (09/13 0916) Pulse Rate:  [54-76] 69 (09/13 0916) Resp:  [0-38] 15 (09/13 0916) BP: (103-150)/(72-107) 150/89 (09/13 0916) SpO2:  [69 %-100 %] 100 % (09/13 0916) Weight:  [226 lb 10.1 oz (102.8 kg)] 226 lb 10.1 oz (102.8 kg) (09/13 0421)  Intake/Output from previous day:  Intake/Output Summary (Last 24 hours) at 10/14/15 1038 Last data filed at 10/14/15 0241  Gross per 24 hour  Intake              720 ml  Output              300 ml  Net              420 ml    Physical Exam: General appearance: alert, cooperative and mild distress Lungs: clear to auscultation bilaterally Heart: regular rate and rhythm Extremities: Rt FA site without hematoma or ecchymosis Neurologic: Grossly normal   Rate: 68  Rhythm: normal sinus rhythm  Lab Results:  Recent Labs  10/13/15 0533 10/14/15 0435  WBC 5.4 4.7  HGB 12.8* 12.0*  PLT 272 233    Recent Labs  10/13/15 0533 10/14/15 0435  NA 139 137  K 3.6 3.6  CL 104 105  CO2 27 26  GLUCOSE 94 100*  BUN 16 15  CREATININE 1.21 1.18    Recent Labs  10/13/15 0533 10/13/15 0953  TROPONINI 0.15* 0.13*    Recent Labs  10/13/15 1631  INR 1.47    Scheduled Meds: . atorvastatin  40 mg Oral q1800  . carvedilol  3.125 mg Oral BID WC  . cefTAZidime (FORTAZ)  IV  2 g Intravenous Q8H  . feeding supplement (ENSURE ENLIVE)  237 mL Oral BID BM  . sodium chloride flush  3 mL Intravenous Q12H  . ticagrelor  90 mg Oral BID  . vancomycin  1,000 mg Intravenous Q8H   Continuous Infusions:  PRN Meds:.sodium chloride, acetaminophen, diphenhydrAMINE, docusate sodium, methocarbamol, nitroGLYCERIN, ondansetron (ZOFRAN) IV, oxyCODONE, sodium chloride flush   Imaging: Dg Chest 2 View  Result Date: 10/12/2015 CLINICAL DATA:  Chest pain and shortness of breath for 1 day. EXAM:  CHEST  2 VIEW COMPARISON:  PA and lateral chest 09/19/2015. FINDINGS: The lungs are clear. No pneumothorax or pleural effusion. Heart size is normal. No focal bony abnormality. IMPRESSION: No acute disease. Electronically Signed   By: Drusilla Kannerhomas  Dalessio M.D.   On: 10/12/2015 18:07   Ct Angio Chest Pe W And/or Wo Contrast  Result Date: 10/12/2015 CLINICAL DATA:  Recent orthopedic surgery. Leg pain, chest pain and shortness of breath. EXAM: CT ANGIOGRAPHY CHEST WITH CONTRAST TECHNIQUE: Multidetector CT imaging of the chest was performed using the standard protocol during bolus administration of intravenous contrast. Multiplanar CT image reconstructions and MIPs were obtained to evaluate the vascular anatomy. CONTRAST:  100 mL Isovue 370 IV COMPARISON:  Chest radiograph 10/12/2015 FINDINGS: Vascular: No pulmonary embolus. The main pulmonary artery is within normal limits for size. No CT evidence of acute right heart strain. Visualized portion of the aorta is normal. Heart size is normal. Mediastinum/Nodes: No mediastinal or axillary adenopathy. Lungs: No pulmonary nodules or masses. No pleural effusion or focal consolidation. Calcified granuloma in the left lower lobe. Visualized abdomen: Contrast bolus timing is not optimized for evaluation of the abdominal organs. There is a  hypo attenuating focus within the right hepatic lobe measuring 3.0 x 2.3 centimeters (series 401, image 154). Visualized upper abdominal structures are otherwise unremarkable. Musculoskeletal: No lytic or blastic osseous lesions. The visualized extrathoracic soft tissues are normal. IMPRESSION: 1. No pulmonary embolus. 2. Clear lungs. 3. Hypoattenuating lesion within the right hepatic lobe, incompletely evaluated on this study. This may be a benign lesion such as a hemangioma. Abdominal MRI with and without contrast could be performed for more complete characterization. Electronically Signed   By: Deatra Robinson M.D.   On: 10/12/2015 22:15     Cardiac Studies: echo 10/14/15- pending  Assessment/Plan:  45 year old schizophrenic, bipolar AA male who was recently released from prison.  He was living in a hotel when a porcelain sink fell on his right heel, severing his Rt achilles tendon. He underwent I&D and tendon repair 09/19/15. He was discharged 10/01/15. Post op he did not fill his Xarelto Rx but took ASA.  He presented to the ED 10/12/15 with exertional chest pain and SOB that had been going on for two days. His drug screen was positive for cocaine.  His Troponin came back positive- pk 0.18. Cath done 10/13/15 revealed a 90% proximal LAD and no other significant CAD. He underwent successful PCI with DES.   Principal Problem:   NSTEMI (non-ST elevated myocardial infarction) (HCC) Active Problems:   CAD -S/P PCI LAD/DES 10/13/15   Achilles tendon rupture-surgery 09/19/15   Wound infection- for I&D achilles wound 10/14/15   Cocaine abuse-drug sceen positive   Schizoaffective disorder, bipolar type (HCC)   PLAN: Pt is for I&D of his achilles wound. Brilinta and ASA cannot be stopped for this.  He is stable for discharge from a cardiac standpoint.   Note: Interesting that his drug screen was positive for cocaine (which he denies) but negative for opiates which he says he was taking prior to admission. One possible explanation is that he was selling his Vicodin for cocaine. Would not discharge on narcotics.   Corine Shelter PA-C 10/14/2015, 10:38 AM [phone]  The patient was seen, examined and discussed with Corine Shelter, PA-C and I agree with the above.   S/p cath yesterday, finding of 80% stenosis in the distal LM/ostial LAD, s/p DES, with Synergy stent, the patient has underlying psychiatric problem, active drug abuse and h/o medication non-compliance. I have spent extensive time explaining how crucial is it for him to take his medication for at least 3 months - ASA -Brilinta, but ideally 1 year.  He denies drug use, asking for pain  meds. Awaiting I&D of the Achilles tendon wound.   Tobias Alexander 10/14/2015

## 2015-10-14 NOTE — Anesthesia Procedure Notes (Signed)
Procedure Name: Intubation Date/Time: 10/14/2015 5:10 PM Performed by: Gwenyth AllegraADAMI, Brett Salinas Pre-anesthesia Checklist: Patient identified, Suction available, Emergency Drugs available, Timeout performed and Patient being monitored Patient Re-evaluated:Patient Re-evaluated prior to inductionOxygen Delivery Method: Circle system utilized Preoxygenation: Pre-oxygenation with 100% oxygen Intubation Type: IV induction Ventilation: Mask ventilation without difficulty Grade View: Grade I Tube type: Oral Tube size: 8.0 mm Number of attempts: 1 Airway Equipment and Method: Stylet Placement Confirmation: ETT inserted through vocal cords under direct vision,  positive ETCO2 and breath sounds checked- equal and bilateral Secured at: 23 cm Tube secured with: Tape Dental Injury: Teeth and Oropharynx as per pre-operative assessment

## 2015-10-15 ENCOUNTER — Encounter (HOSPITAL_COMMUNITY): Payer: Self-pay | Admitting: Orthopedic Surgery

## 2015-10-15 LAB — BODY FLUID CULTURE: SPECIAL REQUESTS: NORMAL

## 2015-10-15 MED ORDER — TICAGRELOR 90 MG PO TABS: 90.0000 mg | ORAL_TABLET | Freq: Two times a day (BID) | ORAL | 2 refills | Status: DC

## 2015-10-15 MED ORDER — DOXYCYCLINE HYCLATE 100 MG PO TABS: 100.0000 mg | ORAL_TABLET | Freq: Two times a day (BID) | ORAL | Status: DC

## 2015-10-15 MED ORDER — NITROGLYCERIN 0.4 MG SL SUBL: 0.4000 mg | SUBLINGUAL_TABLET | SUBLINGUAL | 12 refills | Status: DC | PRN

## 2015-10-15 MED ORDER — ATORVASTATIN CALCIUM 40 MG PO TABS: 40.0000 mg | ORAL_TABLET | Freq: Every day | ORAL | 2 refills | Status: DC

## 2015-10-15 MED ORDER — OXYCODONE HCL 5 MG PO TABS: 5.0000 mg | ORAL_TABLET | ORAL | 0 refills | Status: DC | PRN

## 2015-10-15 MED ORDER — DOXYCYCLINE HYCLATE 100 MG PO TABS: 100.0000 mg | ORAL_TABLET | Freq: Two times a day (BID) | ORAL | 0 refills | Status: DC

## 2015-10-15 MED ORDER — CARVEDILOL 3.125 MG PO TABS: 3.1250 mg | ORAL_TABLET | Freq: Two times a day (BID) | ORAL | 2 refills | Status: DC

## 2015-10-15 MED FILL — DOXYCYCLINE HYCLATE 100 MG: 100 | 14 days supply | Qty: 28 | Fill #0

## 2015-10-15 MED FILL — NITROSTAT 0.4 MG TABLET SL: 0.4 | 25 days supply | Qty: 25 | Fill #0

## 2015-10-15 MED FILL — BRILINTA 90 MG TABLET: 90 | 30 days supply | Qty: 60 | Fill #0

## 2015-10-15 MED FILL — ATORVASTATIN 40 MG TABLET: 40 | 30 days supply | Qty: 30 | Fill #0

## 2015-10-15 MED FILL — ?CARVEDILOL 3.125 MG TABLET: 3.125 | 15 days supply | Qty: 30 | Fill #0

## 2015-10-15 MED FILL — oxyCODONE HCL 5 MG TABS: 5 | 3 days supply | Qty: 50 | Fill #0

## 2015-10-15 NOTE — Progress Notes (Signed)
Pt stable Vac working Plan dc today

## 2015-10-15 NOTE — Evaluation (Signed)
Physical Therapy Evaluation Patient Details Name: Brett Salinas MRN: [id number] DOB: [birth year] Today's Date: 10/15/2015   History of Present Illness  Pt is a 45 y/o male admitted for chest pain and dyspnea who is now s/p I&D of R ankle infection. Pt with recent R achilles laceration injury. PMH including but not limited to severe schitzophrenia, bipolar, and HTN.  Clinical Impression  Pt presented sitting upright in chair when PT entered room. Pt was agitated initially, stating that he was ready to d/c from the hospital and wanted his IV removed. Pt also requesting RW at this time as he stated that he believed his crutches caused him to have multiple heart attacks. Pt moved well throughout session; however, ambulated with NWB R LE even with VC'ing and education that he is allowed to [facility] 50%. Pt would continue to benefit from skilled physical therapy services at this time while admitted to address his below listed limitations in order to improve his overall safety and independence with functional mobility. PT also delivered theraband to pt for strength training once he is d/c'd.      Follow Up Recommendations Supervision - Intermittent    Equipment Recommendations  Rolling walker with 5" wheels    Recommendations for Other Services       Precautions / Restrictions Precautions Precautions: Fall Precaution Comments: wound VAC in place Restrictions Weight Bearing Restrictions: Yes RLE Weight Bearing: Partial weight bearing RLE Partial Weight Bearing Percentage or Pounds: 50%      Mobility  Bed Mobility               General bed mobility comments: Pt sitting OOB in chair when PT entered room  Transfers Overall transfer level: Needs assistance Equipment used: None Transfers: Sit to/from Stand Sit to Stand: Supervision         General transfer comment: pt impulsive with standing, but does well maintaining NWBing (although PT educated pt on PWB 50%  status).  Ambulation/Gait Ambulation/Gait assistance: Min guard Ambulation Distance (Feet): 150 Feet Assistive device: Rolling walker (2 wheeled) Gait Pattern/deviations: Step-to pattern (pt using a hop-to pattern on L LE) Gait velocity: decreased Gait velocity interpretation: Below normal speed for age/gender    Stairs            Wheelchair Mobility    Modified Rankin (Stroke Patients Only)       Balance Overall balance assessment: Needs assistance Sitting-balance support: Feet supported;No upper extremity supported Sitting balance-Leahy Scale: Good     Standing balance support: During functional activity;No upper extremity supported Standing balance-Leahy Scale: Fair                               Pertinent Vitals/Pain Pain Assessment: Faces Faces Pain Scale: No hurt Pain Intervention(s): Monitored during session    Home Living Family/patient expects to be discharged to:: Other (Comment) (hotel)                 Additional Comments: pt stated that he was staying at a hotel and with a "priest". Pt's historian ability is inconsistent.    Prior Function Level of Independence: Independent with assistive device(s)         Comments: pt ambulated with crutches     Hand Dominance        Extremity/Trunk Assessment   Upper Extremity Assessment: Overall WFL for tasks assessed           Lower Extremity Assessment: RLE deficits/detail RLE  Deficits / Details: WFL with the exception of ankle. Pt is also PWB 50% R LE.    Cervical / Trunk Assessment: Normal  Communication   Communication: No difficulties  Cognition Arousal/Alertness: Awake/alert Behavior During Therapy: Impulsive;Anxious Overall Cognitive Status: No family/caregiver present to determine baseline cognitive functioning                      General Comments      Exercises        Assessment/Plan    PT Assessment Patient needs continued PT services  PT  Diagnosis Difficulty walking;Acute pain   PT Problem List Decreased strength;Decreased range of motion;Decreased activity tolerance;Decreased balance;Decreased mobility;Decreased coordination;Decreased knowledge of use of DME;Pain  PT Treatment Interventions DME instruction;Gait training;Stair training;Functional mobility training;Therapeutic activities;Therapeutic exercise;Balance training;Neuromuscular re-education;Patient/family education   PT Goals (Current goals can be found in the Care Plan section) Acute Rehab PT Goals Patient Stated Goal: to d/c from hospital PT Goal Formulation: With patient Time For Goal Achievement: 10/29/15 Potential to Achieve Goals: Good    Frequency Min 5X/week   Barriers to discharge Other (comment) pt's d/c disposition is unclear as pt is not a reliable historian    Co-evaluation               End of Session Equipment Utilized During Treatment: Gait belt Activity Tolerance: Treatment limited secondary to agitation;Patient limited by pain Patient left: in chair;with call bell/phone within reach Nurse Communication: Mobility status    Functional Assessment Tool Used: clinical judgement Functional Limitation: Mobility: Walking and moving around Mobility: Walking and Moving Around Current Status (W0981(G8978): At least 1 percent but less than 20 percent impaired, limited or restricted Mobility: Walking and Moving Around Goal Status [phone](G8979): 0 percent impaired, limited or restricted    Time: [id number] PT Time Calculation (min) (ACUTE ONLY): 18 min   Charges:   PT Evaluation $PT Eval Moderate Complexity: 1 Procedure     PT G Codes:   PT G-Codes **NOT FOR INPATIENT CLASS** Functional Assessment Tool Used: clinical judgement Functional Limitation: Mobility: Walking and moving around Mobility: Walking and Moving Around Current Status (Y8657(G8978): At least 1 percent but less than 20 percent impaired, limited or restricted Mobility: Walking and Moving  Around Goal Status [phone](G8979): 0 percent impaired, limited or restricted    [facility] Institute LLCJennifer M Ion Gonnella 10/15/2015, 12:53 PM Deborah ChalkJennifer Shuayb Schepers, PT, DPT [phone]

## 2015-10-15 NOTE — Op Note (Signed)
NAME:  Brett Salinas, Brett Salinas               ACCOUNT NO.:  [id number]  MEDICAL RECORD NO.:  [id number]  LOCATION:                                 FACILITY:  PHYSICIAN:  Burnard BuntingG. Scott Dean, M.D.    DATE OF BIRTH:  06/23/1970  DATE OF PROCEDURE: DATE OF DISCHARGE:                              OPERATIVE REPORT   PREOPERATIVE DIAGNOSIS:  Right ankle postop infection.  POSTOPERATIVE DIAGNOSIS:  Right ankle postop infection.  PROCEDURE:  Right ankle incision and drainage.  SURGEON:  Burnard BuntingG. Scott Dean, M.D.  ASSISTANT:  None.  ANESTHESIA:  General.  INDICATIONS:  [name] is a 45 year old patient, several weeks out from right acute laceration of skin with laceration of the Achilles tendon. He presents now for operative management of what appears to be a superficial postop wound infection.  PROCEDURE IN DETAIL:  The patient was brought to the operating room where general anesthetic was induced.  Perioperative IV antibiotics were continued.  Right leg was prepped with Hibiclens, draped in a sterile manner.  Time-out called, prior sutures were removed.  The patient did have a cavity at the proximal aspect of the incision, this was opened up.  Distal half of the incision was actually intact and healed.  The cavity was evacuated after cultures obtained.  Curettage performed. Thorough irrigation was then performed.  The incision was left open and an incisional VAC was placed.  At this time, Ace wrap was placed.  The patient tolerated the procedure well without immediate complication, transferred to the recovery room in stable condition.  Plan for overnight observation and discharge home tomorrow.     Burnard BuntingG. Scott Dean, M.D.   ______________________________ Reece AgarG. Dorene GrebeScott Dean, M.D.    GSD/MEDQ  D:  10/14/2015  T:  10/15/2015  Job:  [id number]

## 2015-10-15 NOTE — Progress Notes (Signed)
Subjective:  C/O pain RFA site and Rt achilles, improved from yesterday.  Objective:  Vital Signs in the last 24 hours: Temp:  [97.3 F (36.3 C)-98.4 F (36.9 C)] 98.3 F (36.8 C) (09/14 0620) Pulse Rate:  [57-102] 67 (09/14 0620) Resp:  [7-23] 18 (09/14 0620) BP: (132-145)/(85-106) 140/87 (09/14 0620) SpO2:  [99 %-100 %] 100 % (09/14 0620)  Intake/Output from previous day:  Intake/Output Summary (Last 24 hours) at 10/15/15 0937 Last data filed at 10/15/15 0700  Gross per 24 hour  Intake              374 ml  Output             1600 ml  Net            -1226 ml    Physical Exam: General appearance: alert, cooperative and mild distress Lungs: clear to auscultation bilaterally Heart: regular rate and rhythm Extremities: Rt FA site without hematoma or ecchymosis Neurologic: Grossly normal   Rate: 68  Rhythm: normal sinus rhythm  Lab Results:  Recent Labs  10/13/15 0533 10/14/15 0435  WBC 5.4 4.7  HGB 12.8* 12.0*  PLT 272 233    Recent Labs  10/13/15 0533 10/14/15 0435  NA 139 137  K 3.6 3.6  CL 104 105  CO2 27 26  GLUCOSE 94 100*  BUN 16 15  CREATININE 1.21 1.18    Recent Labs  10/13/15 0533 10/13/15 0953  TROPONINI 0.15* 0.13*    Recent Labs  10/13/15 1631  INR 1.47    Scheduled Meds: . aspirin EC  81 mg Oral Daily  . atorvastatin  40 mg Oral q1800  . carvedilol  3.125 mg Oral BID WC  . cefTAZidime (FORTAZ)  IV  2 g Intravenous Q8H  . doxycycline  100 mg Oral Q12H  . feeding supplement (ENSURE ENLIVE)  237 mL Oral BID BM  . sodium chloride flush  3 mL Intravenous Q12H  . ticagrelor  90 mg Oral BID   Continuous Infusions:  PRN Meds:.sodium chloride, acetaminophen **OR** acetaminophen, acetaminophen, diphenhydrAMINE, docusate sodium, HYDROmorphone (DILAUDID) injection, methocarbamol, metoCLOPramide **OR** metoCLOPramide (REGLAN) injection, nitroGLYCERIN, ondansetron **OR** ondansetron (ZOFRAN) IV, oxyCODONE, phenol, sodium chloride  flush   Imaging: No results found.  Cardiac Studies: echo 10/14/15- pending  Assessment/Plan:  45 year old schizophrenic, bipolar AA male who was recently released from prison.  He was living in a hotel when a porcelain sink fell on his right heel, severing his Rt achilles tendon. He underwent I&D and tendon repair 09/19/15. He was discharged 10/01/15. Post op he did not fill his Xarelto Rx but took ASA.  He presented to the ED 10/12/15 with exertional chest pain and SOB that had been going on for two days. His drug screen was positive for cocaine.  His Troponin came back positive- pk 0.18. Cath done 10/13/15 revealed a 90% proximal LAD and no other significant CAD. He underwent successful PCI with DES.   Principal Problem:   NSTEMI (non-ST elevated myocardial infarction) [facility](HCC) Active Problems:   Achilles tendon rupture-surgery 09/19/15   CAD -S/P PCI LAD/DES 10/13/15   Wound infection- for I&D achilles wound 10/14/15   Cocaine abuse-drug sceen positive   Schizoaffective disorder, bipolar type (HCC)   Infection   S/p cath on 9/14, finding of 80% stenosis in the distal LM/ostial LAD, s/p DES, with Synergy stent, the patient has underlying psychiatric problem, active drug abuse and h/o medication non-compliance. I have spent extensive time explaining how crucial  is it for him to take his medication for at least 3 months - ASA -Brilinta, but ideally 1 year. Also carvedilol and atorvastatin, case management on board. He denies drug use, asking for pain meds. We will arrange for an early outpatient follow up - TOC, echo 4-6 weeks from now followed by an outpatient visit.   Tobias Alexander 10/15/2015

## 2015-10-15 NOTE — Progress Notes (Addendum)
Upon entering room Pt body language and tone of voice suggests pt is irritable and agitated. On multiple occasions pt threatened he would leave the unit AMA and stated " everyone is trying to kill me" and " these doctors put acid in my foot". Pt was educated on pain associated with procedure and given pain medicine for his pain. Pt however stated " I want the medicine that goes in this tube and you need to tell the doctor that" as pt pointed to the IV in his forearm. This RN paged Magnus IvanBlackman and received order for IV dilaudid 1mg  every 2 hours PRN; MD  orders carried out. Pts pain better controlled at this time. Pt however continues to threaten " I'm gonna use my angry voice and I'm gonna start throwing things." Pt was also offered his scheduled medicine brilinta. pt stated "I dont want that, im in pain and you're giving me that d*&@ medicine for my heart." This RN explained importance of medication since following his recent cardiac incidences. Pt agreed to take medicine. RN handed pt medicine in a cup and pt took the cup and threw the medicine in the trash. Medicine was documented as not given for this reason. On 1  occasion of giving pt pain medicine IV  Through port of primary fluid, pt stated " I didn't feel the medicine go into my arm this time, you gave me water, you people always give people placebos and that's how people get killed" Pt was assured that medicine was given as ordered and patient had eyes focused on this RN while medicine was being drawn up from the vial. For future pain medicine administration, RN had members of staff (Roselle RN and Judeth CornfieldStephanie NT) accompany RN into patient room to witness administration. Patient also asked why his blood tested positive for cocaine and not opioids despite being discharged with opioids. 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 Times when patient can have pain medicine are written on dry-erase board in clear view and RN phone number is also on board.  At this time pt is resting with eyes closed. RN will continue to monitor pt.

## 2015-10-19 ENCOUNTER — Ambulatory Visit: Payer: Self-pay | Attending: Internal Medicine | Admitting: Physician Assistant

## 2015-10-19 VITALS — BP 134/79 | HR 90 | Temp 98.2°F | Resp 18 | Ht 79.0 in | Wt 222.2 lb

## 2015-10-19 DIAGNOSIS — F319 Bipolar disorder, unspecified: Secondary | ICD-10-CM | POA: Insufficient documentation

## 2015-10-19 DIAGNOSIS — F1410 Cocaine abuse, uncomplicated: Secondary | ICD-10-CM | POA: Insufficient documentation

## 2015-10-19 DIAGNOSIS — F209 Schizophrenia, unspecified: Secondary | ICD-10-CM | POA: Insufficient documentation

## 2015-10-19 DIAGNOSIS — I10 Essential (primary) hypertension: Secondary | ICD-10-CM | POA: Insufficient documentation

## 2015-10-19 DIAGNOSIS — L03115 Cellulitis of right lower limb: Secondary | ICD-10-CM | POA: Insufficient documentation

## 2015-10-19 DIAGNOSIS — I2510 Atherosclerotic heart disease of native coronary artery without angina pectoris: Secondary | ICD-10-CM | POA: Insufficient documentation

## 2015-10-19 DIAGNOSIS — M479 Spondylosis, unspecified: Secondary | ICD-10-CM | POA: Insufficient documentation

## 2015-10-19 DIAGNOSIS — M170 Bilateral primary osteoarthritis of knee: Secondary | ICD-10-CM | POA: Insufficient documentation

## 2015-10-19 DIAGNOSIS — F329 Major depressive disorder, single episode, unspecified: Secondary | ICD-10-CM | POA: Insufficient documentation

## 2015-10-19 DIAGNOSIS — I110 Hypertensive heart disease with heart failure: Secondary | ICD-10-CM | POA: Insufficient documentation

## 2015-10-19 DIAGNOSIS — M160 Bilateral primary osteoarthritis of hip: Secondary | ICD-10-CM | POA: Insufficient documentation

## 2015-10-19 DIAGNOSIS — I214 Non-ST elevation (NSTEMI) myocardial infarction: Secondary | ICD-10-CM | POA: Insufficient documentation

## 2015-10-19 DIAGNOSIS — Z9861 Coronary angioplasty status: Secondary | ICD-10-CM

## 2015-10-19 DIAGNOSIS — Z86718 Personal history of other venous thrombosis and embolism: Secondary | ICD-10-CM | POA: Insufficient documentation

## 2015-10-19 DIAGNOSIS — E7800 Pure hypercholesterolemia, unspecified: Secondary | ICD-10-CM | POA: Insufficient documentation

## 2015-10-19 LAB — AEROBIC/ANAEROBIC CULTURE W GRAM STAIN (SURGICAL/DEEP WOUND)

## 2015-10-19 LAB — AEROBIC/ANAEROBIC CULTURE (SURGICAL/DEEP WOUND)

## 2015-10-19 MED FILL — OXYCODONE/APAP 5-325: 5-325 | 7 days supply | Qty: 40 | Fill #0

## 2015-10-19 NOTE — Progress Notes (Signed)
Patient is here for HFU  Patient complains of left side chest pain being present intermittently.  Patient has taken medication today and patient has eaten today.

## 2015-10-19 NOTE — Progress Notes (Signed)
Brett Salinas, is a 45 y.o. male  ZOX:[id number]  WJX:[id number]  DOB - [birth year]  Subjective:  Chief Complaint and HPI: Brett Salinas is a 45 y.o. male here today to establish care and for a follow up visit after having NSTEMI.  He was hospitalized after presenting to the ED on 10/12/2015 with CP.  He says he was in the hospital from 10/12/2015-10/15/2015(there is no d/c summary in EMR currently).   It appears he had cath with 90% blockage of the proximal LAD that was resolved with angiography.  He was cocaine +.  He was in prison for many years until a few months ago.  He had also had a ruptured R achilles tendon in August, 2017 with surgical repair.  On 10/14/2015 while he was hospitalized, an I&D was performed because the area had become infected.  Needed follow-up from this procedure is unclear.  Today he denies CP like he was having in the hospital prior to cath.  He is having some soreness in his chest and underarms where he has been using his crutches.  He denies cocaine use since his hospitalization.    PMH: htn, bipolar, schizophrenia, substance abuse   ED/Hospital notes reviewed; however, no discharge summary is in his EMR at this time   ROS:   Constitutional:  No f/c, No night sweats, No unexplained weight loss. EENT:  No vision changes, No blurry vision, No hearing changes. No mouth, throat, or ear problems.  Respiratory: No cough, No SOB Cardiac: No CP, no palpitations GI:  No abd pain, No N/V/D. GU: No Urinary s/sx Musculoskeletal: No joint pain Neuro: No headache, no dizziness, no motor weakness.  Skin: No rash Endocrine:  No polydipsia. No polyuria.  Psych: Denies SI/HI  No problems updated.  ALLERGIES: Allergies  Allergen Reactions  . 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 Pork-Derived Products Itching    Burning/itching  . Heparin Itching    Full-body itching inc throat    PAST MEDICAL HISTORY: Past Medical History:  Diagnosis Date  . Arthritis    "hips,  knees, back" (10/13/2015)  . Bipolar disorder (HCC)   . CHF (congestive heart failure) (HCC)   . Coronary artery disease   . Depression   . DVT (deep venous thrombosis) (HCC) 09/2015   right  . Heart murmur   . High cholesterol   . Hypertension   . Myocardial infarction (HCC) 10/12/2015 X 2-3  . Rectal myiasis   . Schizophrenia (HCC)   . Sciatica     MEDICATIONS AT HOME: Prior to Admission medications   Medication Sig Start Date End Date Taking? Authorizing Provider  atorvastatin (LIPITOR) 40 MG tablet Take 1 tablet (40 mg total) by mouth daily at 6 PM. 10/15/15  Yes Cammy Copa, MD  carvedilol (COREG) 3.125 MG tablet Take 1 tablet (3.125 mg total) by mouth 2 (two) times daily with a meal. 10/15/15  Yes Cammy Copa, MD  doxycycline (VIBRA-TABS) 100 MG tablet Take 1 tablet (100 mg total) by mouth every 12 (twelve) hours. 10/15/15  Yes Scott Rise Paganini, MD  methocarbamol (ROBAXIN) 500 MG tablet Take 1 tablet (500 mg total) by mouth every 6 (six) hours as needed for muscle spasms. 09/24/15  Yes Scott Rise Paganini, MD  nitroGLYCERIN (NITROSTAT) 0.4 MG SL tablet Place 1 tablet (0.4 mg total) under the tongue every 5 (five) minutes x 3 doses as needed for chest pain. 10/15/15  Yes Scott Rise Paganini, MD  oxyCODONE (OXY IR/ROXICODONE) 5 MG immediate  release tablet Take 1-2 tablets (5-10 mg total) by mouth every 3 (three) hours as needed for breakthrough pain. 10/15/15  Yes Scott Rise PaganiniGregory Dean, MD  ticagrelor (BRILINTA) 90 MG TABS tablet Take 1 tablet (90 mg total) by mouth 2 (two) times daily. 10/15/15  Yes Scott Rise PaganiniGregory Dean, MD  docusate sodium (COLACE) 100 MG capsule Take 1 capsule (100 mg total) by mouth 2 (two) times daily. Patient not taking: Reported on 10/19/2015 09/24/15   Cammy CopaScott Gregory Dean, MD     Objective:  EXAM:   Vitals:   10/19/15 1424  BP: 134/79  Pulse: 90  Resp: 18  Temp: 98.2 F (36.8 C)  TempSrc: Oral  SpO2: 98%  Weight: 222 lb 3.2 oz (100.8 kg)  Height:  6\' 7"  (2.007 m)    General appearance : A&OX3. NAD. Non-toxic-appearing.  Ambulating with crutches HEENT: Atraumatic and Normocephalic.  PERRLA. EOM intact.  TM clear B. Mouth-MMM, post pharynx WNL w/o erythema, No PND. Neck: supple, no JVD. No cervical lymphadenopathy. No thyromegaly Chest/Lungs:  Breathing-non-labored, Good air entry bilaterally, breath sounds normal without rales, rhonchi, or wheezing  CVS: S1 S2 regular, no murmurs, gallops, rubs  Extremities: Bilateral Lower Ext shows no edema, both legs are warm to touch with = pulse throughout.  No sign of erythema, streaking, or worsening of infection of the R ankle Neurology:  CN II-XII grossly intact, Non focal.   Psych:  TP tangential. Defensive overall.  Poor judgement and insight.  Poor historian.  Skin:  No Rash  Data Review Lab Results  Component Value Date   HGBA1C 5.6 10/12/2015     Assessment & Plan   1. NSTEMI (non-ST elevated myocardial infarction) (HCC) No CP currently F/up with cardiology  2. CAD -S/P PCI LAD/DES 10/13/15 F/up with cardiology  3. Cocaine abuse-drug sceen positive Encouraged 12 step recovery.  Patient not receptive  4. Cellulitis of right ankle - AMB referral to wound care center Make f/up appt with ortho Continue Doxy  5.  Htn-currently controlled.  Continue current medications.  No labs needed today. Patient is stable overall, should avoid drug use, and keep all f/up appts.   Patient have been counseled extensively about nutrition and exercise  Return in about 4 weeks (around 11/16/2015) for establish with PCP.  The patient was given clear instructions to go to ER or return to medical center if symptoms don't improve, worsen or new problems develop. The patient verbalized understanding. The patient was told to call to get lab results if they haven't heard anything in the next week.     Georgian CoAngela Adonys Wildes, PA-C [facility] [place],  [place] [phone]   10/19/2015, 2:39 PMPatient ID: Brett Salinas, male   DOB: 04/27/1970, 45 y.o.   MRN: [id number]

## 2015-10-20 ENCOUNTER — Inpatient Hospital Stay: Payer: Self-pay

## 2015-10-20 ENCOUNTER — Encounter: Payer: Self-pay | Admitting: General Practice

## 2015-10-21 ENCOUNTER — Encounter: Payer: Self-pay | Admitting: Physician Assistant

## 2015-10-21 ENCOUNTER — Inpatient Hospital Stay: Payer: Self-pay

## 2015-10-22 ENCOUNTER — Encounter: Payer: Self-pay | Admitting: Physician Assistant

## 2015-10-27 ENCOUNTER — Encounter: Payer: Self-pay | Admitting: Cardiology

## 2015-10-29 ENCOUNTER — Encounter: Payer: Self-pay | Admitting: Cardiology

## 2015-10-29 ENCOUNTER — Ambulatory Visit (INDEPENDENT_AMBULATORY_CARE_PROVIDER_SITE_OTHER): Payer: Self-pay | Admitting: Cardiology

## 2015-10-29 VITALS — BP 120/90 | HR 64 | Ht 79.0 in | Wt 228.8 lb

## 2015-10-29 DIAGNOSIS — I2510 Atherosclerotic heart disease of native coronary artery without angina pectoris: Secondary | ICD-10-CM

## 2015-10-29 DIAGNOSIS — Z9861 Coronary angioplasty status: Secondary | ICD-10-CM

## 2015-10-29 MED FILL — OXYCODONE/APAP 5-325: 5-325 | 7 days supply | Qty: 40 | Fill #0

## 2015-10-29 NOTE — Patient Instructions (Signed)
Medication Instructions:   START TAKING ASPIRIN 81 MG ONCE A DAY   If you need a refill on your cardiac medications before your next appointment, please call your pharmacy.  Labwork: NONE ORDER TODAY    Testing/Procedures: NONE ORDER TODAY    Follow-Up:  WITH DR NELSON IN 6 TO 8 WEEKS    Any Other Special Instructions Will Be Listed Below (If Applicable).

## 2015-10-29 NOTE — Progress Notes (Signed)
10/29/2015 Brett Salinas   [birth year]  [id number]  Primary Physician No PCP Per Patient Primary Cardiologist: Dr. Delton See  Reason for Visit/CC: [facility] F/u for CAD   HPI:  45 year old schizophrenic, bipolar AA male who was recently released from prison. He was living in a hotel when a porcelain sink fell on his right heel, severing his Rt achilles tendon. He underwent I&D and tendon repair 09/19/15. He was discharged 10/01/15. Post op he did not fill his Xarelto Rx but took ASA.  He presented to the ED 10/12/15 with exertional chest pain and SOB that had been going on for two days. His drug screen was positive for cocaine.  His Troponin came back positive- pk 0.18. Cath done 10/13/15 revealed a 90% proximal LAD and no other significant CAD. He underwent successful PCI with DES. Echo revealed normal left ventricular EF at 60-65%. Mild MR was also noted. He was placed on dual antiplatelet therapy with aspirin and Brilinta as well as carvedilol and atorvastatin.  He presents to clinic for f/u. He denies CP. No dyspnea. He has not required use of SL NTG. He has been compliant with Brilinta but not ASA. He states the did not think he had to take ASA because the two mediations would be "too much blood thinner". He has been compliant with other meds. He remains on crutches. He is furstrated given his current social situation. He is still homeless. He states that he has been able to get all of his meds at [facility]. He denies further cocaine use. No tobacco use.    Current Meds  Medication Sig  . atorvastatin (LIPITOR) 40 MG tablet Take 1 tablet (40 mg total) by mouth daily at 6 PM.  . carvedilol (COREG) 3.125 MG tablet Take 1 tablet (3.125 mg total) by mouth 2 (two) times daily with a meal.  . doxycycline (VIBRA-TABS) 100 MG tablet Take 1 tablet (100 mg total) by mouth every 12 (twelve) hours.  . methocarbamol (ROBAXIN) 500 MG tablet Take 1 tablet (500 mg total) by mouth every 6  (six) hours as needed for muscle spasms.  . nitroGLYCERIN (NITROSTAT) 0.4 MG SL tablet Place 1 tablet (0.4 mg total) under the tongue every 5 (five) minutes x 3 doses as needed for chest pain.  Marland Kitchen oxyCODONE (OXY IR/ROXICODONE) 5 MG immediate release tablet Take 1-2 tablets (5-10 mg total) by mouth every 3 (three) hours as needed for breakthrough pain.  . ticagrelor (BRILINTA) 90 MG TABS tablet Take 1 tablet (90 mg total) by mouth 2 (two) times daily.   Allergies  Allergen Reactions  . 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 Pork-Derived Products Itching    Burning/itching  . Heparin Itching    Full-body itching inc throat   Past Medical History:  Diagnosis Date  . Arthritis    "hips, knees, back" (10/13/2015)  . Bipolar disorder (HCC)   . CHF (congestive heart failure) (HCC)   . Coronary artery disease   . Depression   . DVT (deep venous thrombosis) (HCC) 09/2015   right  . Heart murmur   . High cholesterol   . Hypertension   . Myocardial infarction (HCC) 10/12/2015 X 2-3  . Rectal myiasis   . Schizophrenia (HCC)   . Sciatica    No family history on file. Past Surgical History:  Procedure Laterality Date  . CARDIAC CATHETERIZATION N/A 10/13/2015   Procedure: Left Heart Cath and Coronary Angiography;  Surgeon: Corky Crafts, MD;  Location: [facility] INVASIVE  CV LAB;  Service: Cardiovascular;  Laterality: N/A;  . CARDIAC CATHETERIZATION N/A 10/13/2015   Procedure: Coronary Stent Intervention;  Surgeon: Corky CraftsJayadeep S Varanasi, MD;  [facility];  Service: Cardiovascular;  Laterality: N/A;  . CARDIAC CATHETERIZATION N/A 10/13/2015   Procedure: Intravascular Ultrasound/IVUS;  Surgeon: Corky CraftsJayadeep S Varanasi, MD;  [facility];  Service: Cardiovascular;  Laterality: N/A;  . CORONARY ANGIOPLASTY WITH STENT PLACEMENT  10/13/2015  . I&D EXTREMITY Right 09/19/2015   Procedure: IRRIGATION AND DEBRIDEMENT ANKLE LACERATIONS POSSIBLE TENDON REPAIR;  Surgeon: Cammy CopaScott Gregory  Dean, MD;  Location: [facility] OR;  Service: Orthopedics;  Laterality: Right;  . I&D EXTREMITY Right 10/14/2015   Procedure: IRRIGATION AND DEBRIDEMENT EXTREMITY/Right Ankle;  Surgeon: Cammy CopaScott Gregory Dean, MD;  Location: [facility] OR;  Service: Orthopedics;  Laterality: Right;   Social History   Social History  . Marital status: Single    Spouse name: N/A  . Number of children: N/A  . Years of education: N/A   Occupational History  . Not on file.   Social History Main Topics  . Smoking status: Never Smoker  . Smokeless tobacco: Never Used  . Alcohol use Yes     Comment: 10/13/2015 "might have a drink q blue moon"  . Drug use:      Comment: 10/13/2015 "I've tried alot; I don't have any habits"  . Sexual activity: Not on file   Other Topics Concern  . Not on file   Social History Narrative  . No narrative on file     Review of Systems: General: negative for chills, fever, night sweats or weight changes.  Cardiovascular: negative for chest pain, dyspnea on exertion, edema, orthopnea, palpitations, paroxysmal nocturnal dyspnea or shortness of breath Dermatological: negative for rash Respiratory: negative for cough or wheezing Urologic: negative for hematuria Abdominal: negative for nausea, vomiting, diarrhea, bright red blood per rectum, melena, or hematemesis Neurologic: negative for visual changes, syncope, or dizziness All other systems reviewed and are otherwise negative except as noted above.   Physical Exam:  Height 6\' 7"  (2.007 m), weight 228 lb 12.8 oz (103.8 kg).  General appearance: alert, cooperative, no distress and ambulating with crutches, NWB Neck: no carotid bruit and no JVD Lungs: clear to auscultation bilaterally Heart: regular rate and rhythm, S1, S2 normal, no murmur, click, rub or gallop Extremities: s/p right ankle surgery, 1+ right LEE Pulses: 2+ and symmetric Skin: warm and dry Neurologic:  Grossly normal  EKG NSR. No ischemia.   ASSESSMENT AND PLAN:   1.  CAD: s/p  NSTMI 2/2 90% LAD stenosis. S/p PCI + DES. Stable w/o CP. Pt has been taking Brilinta but has not been taking ASA. States he was not aware he was to take it. He thinks it will be "too much blood thinner". Pt educated on the importance of DAPT with both Brilinta and low dose baby ASA daily to prevent in-stent thrombosis/ recurrent MI. ASA samples were given to patient along with written instructions. He has been compliant with Coreg and atorvastatin. EF normal at 60-65%. He has PRN SLNTG and understands proper use and when to call 911.   2. Achilles Tendon Tear: traumatic injury. S/p surgery/ I&D. NWB. On crutches.   3. Cocaine Abuse: denies further use.   4. HTN: BP controlled.  5. DLD: recent LDL 99 mg/dL. Goal given CAD is <70 mg/dL. Continue atorvastatin. Recheck FLP and HFTs at next f/u visit in 6 weeks.    PLAN  F/u with Dr.  Nelson in 6-8 weeks.   Kelsy Polack PA-C 10/29/2015 9:20 AM

## 2015-11-03 ENCOUNTER — Encounter: Payer: Self-pay | Admitting: Pediatric Intensive Care

## 2015-11-04 MED FILL — OXYCODONE/APAP 5-325: 5-325 | 7 days supply | Qty: 40 | Fill #0

## 2015-11-05 ENCOUNTER — Ambulatory Visit (INDEPENDENT_AMBULATORY_CARE_PROVIDER_SITE_OTHER): Payer: Self-pay | Admitting: Orthopedic Surgery

## 2015-11-05 DIAGNOSIS — L97311 Non-pressure chronic ulcer of right ankle limited to breakdown of skin: Secondary | ICD-10-CM

## 2015-11-06 ENCOUNTER — Encounter (HOSPITAL_BASED_OUTPATIENT_CLINIC_OR_DEPARTMENT_OTHER): Payer: Self-pay | Attending: Internal Medicine

## 2015-11-06 DIAGNOSIS — L97413 Non-pressure chronic ulcer of right heel and midfoot with necrosis of muscle: Secondary | ICD-10-CM | POA: Insufficient documentation

## 2015-11-06 DIAGNOSIS — Y838 Other surgical procedures as the cause of abnormal reaction of the patient, or of later complication, without mention of misadventure at the time of the procedure: Secondary | ICD-10-CM | POA: Insufficient documentation

## 2015-11-06 DIAGNOSIS — Z86718 Personal history of other venous thrombosis and embolism: Secondary | ICD-10-CM | POA: Insufficient documentation

## 2015-11-06 DIAGNOSIS — T8131XA Disruption of external operation (surgical) wound, not elsewhere classified, initial encounter: Secondary | ICD-10-CM | POA: Insufficient documentation

## 2015-11-06 DIAGNOSIS — I2510 Atherosclerotic heart disease of native coronary artery without angina pectoris: Secondary | ICD-10-CM | POA: Insufficient documentation

## 2015-11-06 DIAGNOSIS — I509 Heart failure, unspecified: Secondary | ICD-10-CM | POA: Insufficient documentation

## 2015-11-09 ENCOUNTER — Encounter: Payer: Self-pay | Admitting: Pediatric Intensive Care

## 2015-11-09 MED FILL — OXYCODONE/APAP 5-325: 5-325 | 15 days supply | Qty: 30 | Fill #0

## 2015-11-09 NOTE — Discharge Summary (Signed)
Physician Discharge Summary  Patient ID: Brett Salinas MRN: [id number] DOB/AGE: 1970/04/16 45 y.o.  Admit date: 10/12/2015 Discharge date: 10/15/2015  Admission Diagnoses:  Principal Problem:   NSTEMI (non-ST elevated myocardial infarction) [facility]) Active Problems:   Achilles tendon rupture-surgery 09/19/15   CAD -S/P PCI LAD/DES 10/13/15   Wound infection- for I&D achilles wound 10/14/15   Cocaine abuse-drug sceen positive   Schizoaffective disorder, bipolar type [facility])   Infection   Discharge Diagnoses:  Same  Surgeries: Procedure(s): IRRIGATION AND DEBRIDEMENT EXTREMITY/Right Ankle on 10/12/2015 - 10/14/2015   Consultants: Treatment Team:  Dory Peru Lbcardiology, MD  Discharged Condition: Stable  Hospital Course: Brett Salinas is an 45 y.o. male who was admitted 10/12/2015 with a chief complaint of  Chief Complaint  Patient presents with  . Chest Pain  , and found to have a diagnosis of NSTEMI (non-ST elevated myocardial infarction) (HCC).  They were brought to the operating room on 10/12/2015 - 10/14/2015 and underwent the above named procedures.He was rxed for his cv issues and was noted to have possible infection in ankle which had prior achilles repair.  This underwent debridement and oral abx. Did well and dced in good condition wb in fx boot    Antibiotics given:  Anti-infectives    Start     Dose/Rate Route Frequency Ordered Stop   10/15/15 1000  doxycycline (VIBRA-TABS) tablet 100 mg  Status:  Discontinued     100 mg Oral Every 12 hours 10/15/15 0826 10/15/15 1726   10/15/15 0000  doxycycline (VIBRA-TABS) 100 MG tablet     100 mg Oral Every 12 hours 10/15/15 0829     10/14/15 0700  vancomycin (VANCOCIN) IVPB 1000 mg/200 mL premix  Status:  Discontinued     1,000 mg 200 mL/hr over 60 Minutes Intravenous Every 8 hours 10/13/15 2126 10/14/15 2022   10/13/15 2200  vancomycin (VANCOCIN) 1,500 mg in sodium chloride 0.9 % 500 mL IVPB     1,500 mg 250 mL/hr over 120 Minutes  Intravenous  Once 10/13/15 2126 10/13/15 2356   10/13/15 2200  cefTAZidime (FORTAZ) 2 g in dextrose 5 % 50 mL IVPB  Status:  Discontinued     2 g 100 mL/hr over 30 Minutes Intravenous Every 8 hours 10/13/15 2126 10/15/15 1726    .  Recent vital signs:  Vitals:   10/14/15 2345 10/15/15 0620  BP: 133/85 140/87  Pulse: 64 67  Resp: 16 18  Temp: 98.4 F (36.9 C) 98.3 F (36.8 C)    Recent laboratory studies:  Results for orders placed or performed during the hospital encounter of 10/12/15  MRSA PCR Screening  Result Value Ref Range   MRSA by PCR NEGATIVE NEGATIVE  Body fluid culture  Result Value Ref Range   Specimen Description FLUID ANKLE    Special Requests Normal    Gram Stain      FEW WBC PRESENT, PREDOMINANTLY PMN RARE GRAM NEGATIVE RODS    Culture FEW ENTEROBACTER CLOACAE    Report Status 10/15/2015 FINAL    Organism ID, Bacteria ENTEROBACTER CLOACAE       Susceptibility   Enterobacter cloacae - MIC*    CEFAZOLIN >=64 RESISTANT Resistant     CEFEPIME <=1 SENSITIVE Sensitive     CEFTAZIDIME <=1 SENSITIVE Sensitive     CEFTRIAXONE <=1 SENSITIVE Sensitive     CIPROFLOXACIN <=0.25 SENSITIVE Sensitive     GENTAMICIN <=1 SENSITIVE Sensitive     IMIPENEM <=0.25 SENSITIVE Sensitive     TRIMETH/SULFA <=20 SENSITIVE Sensitive  PIP/TAZO <=4 SENSITIVE Sensitive     * FEW ENTEROBACTER CLOACAE  Aerobic/Anaerobic Culture (surgical/deep wound)  Result Value Ref Range   Specimen Description WOUND RIGHT ANKLE    Special Requests NONE    Gram Stain      RARE WBC PRESENT, PREDOMINANTLY PMN NO ORGANISMS SEEN    Culture RARE ENTEROBACTER SPECIES NO ANAEROBES ISOLATED     Report Status 10/19/2015 FINAL    Organism ID, Bacteria ENTEROBACTER SPECIES       Susceptibility   Enterobacter species - MIC*    CEFAZOLIN >=64 RESISTANT Resistant     CEFEPIME <=1 SENSITIVE Sensitive     CEFTAZIDIME <=1 SENSITIVE Sensitive     CEFTRIAXONE <=1 SENSITIVE Sensitive     CIPROFLOXACIN  <=0.25 SENSITIVE Sensitive     GENTAMICIN <=1 SENSITIVE Sensitive     IMIPENEM 0.5 SENSITIVE Sensitive     TRIMETH/SULFA <=20 SENSITIVE Sensitive     PIP/TAZO <=4 SENSITIVE Sensitive     * RARE ENTEROBACTER SPECIES  Basic metabolic panel  Result Value Ref Range   Sodium 139 135 - 145 mmol/L   Potassium 3.8 3.5 - 5.1 mmol/L   Chloride 105 101 - 111 mmol/L   CO2 25 22 - 32 mmol/L   Glucose, Bld 88 65 - 99 mg/dL   BUN 17 6 - 20 mg/dL   Creatinine, Ser 1.191.20 0.61 - 1.24 mg/dL   Calcium 9.7 8.9 - 14.710.3 mg/dL   GFR calc non Af Amer >60 >60 mL/min   GFR calc Af Amer >60 >60 mL/min   Anion gap 9 5 - 15  CBC  Result Value Ref Range   WBC 8.0 4.0 - 10.5 K/uL   RBC 5.06 4.22 - 5.81 MIL/uL   Hemoglobin 14.6 13.0 - 17.0 g/dL   HCT 82.944.6 56.239.0 - 13.052.0 %   MCV 88.1 78.0 - 100.0 fL   MCH 28.9 26.0 - 34.0 pg   MCHC 32.7 30.0 - 36.0 g/dL   RDW 86.513.5 78.411.5 - 69.615.5 %   Platelets 310 150 - 400 K/uL  Brain natriuretic peptide  Result Value Ref Range   B Natriuretic Peptide 72.0 0.0 - 100.0 pg/mL  Urine rapid drug screen (hosp performed)  Result Value Ref Range   Opiates NONE DETECTED NONE DETECTED   Cocaine POSITIVE (A) NONE DETECTED   Benzodiazepines NONE DETECTED NONE DETECTED   Amphetamines NONE DETECTED NONE DETECTED   Tetrahydrocannabinol NONE DETECTED NONE DETECTED   Barbiturates NONE DETECTED NONE DETECTED  CBC  Result Value Ref Range   WBC 5.4 4.0 - 10.5 K/uL   RBC 4.48 4.22 - 5.81 MIL/uL   Hemoglobin 12.8 (L) 13.0 - 17.0 g/dL   HCT 29.540.1 28.439.0 - 13.252.0 %   MCV 89.5 78.0 - 100.0 fL   MCH 28.6 26.0 - 34.0 pg   MCHC 31.9 30.0 - 36.0 g/dL   RDW 44.013.6 10.211.5 - 72.515.5 %   Platelets 272 150 - 400 K/uL  Troponin I  Result Value Ref Range   Troponin I 0.18 (HH) <0.03 ng/mL  Troponin I  Result Value Ref Range   Troponin I 0.15 (HH) <0.03 ng/mL  Troponin I  Result Value Ref Range   Troponin I 0.13 (HH) <0.03 ng/mL  Hemoglobin A1c  Result Value Ref Range   Hgb A1c MFr Bld 5.6 4.8 - 5.6 %   Mean  Plasma Glucose 114 mg/dL  Basic metabolic panel  Result Value Ref Range   Sodium 139 135 - 145 mmol/L  Potassium 3.6 3.5 - 5.1 mmol/L   Chloride 104 101 - 111 mmol/L   CO2 27 22 - 32 mmol/L   Glucose, Bld 94 65 - 99 mg/dL   BUN 16 6 - 20 mg/dL   Creatinine, Ser 1.61 0.61 - 1.24 mg/dL   Calcium 9.1 8.9 - 09.6 mg/dL   GFR calc non Af Amer >60 >60 mL/min   GFR calc Af Amer >60 >60 mL/min   Anion gap 8 5 - 15  TSH  Result Value Ref Range   TSH 0.429 0.350 - 4.500 uIU/mL  Lipid panel  Result Value Ref Range   Cholesterol 155 0 - 200 mg/dL   Triglycerides 50 <045 mg/dL   HDL 49 >40 mg/dL   Total CHOL/HDL Ratio 3.2 RATIO   VLDL 10 0 - 40 mg/dL   LDL Cholesterol 96 0 - 99 mg/dL  APTT  Result Value Ref Range   aPTT 56 (H) 24 - 36 seconds  Protime-INR  Result Value Ref Range   Prothrombin Time 18.0 (H) 11.4 - 15.2 seconds   INR 1.47   Basic metabolic panel  Result Value Ref Range   Sodium 137 135 - 145 mmol/L   Potassium 3.6 3.5 - 5.1 mmol/L   Chloride 105 101 - 111 mmol/L   CO2 26 22 - 32 mmol/L   Glucose, Bld 100 (H) 65 - 99 mg/dL   BUN 15 6 - 20 mg/dL   Creatinine, Ser 9.81 0.61 - 1.24 mg/dL   Calcium 8.4 (L) 8.9 - 10.3 mg/dL   GFR calc non Af Amer >60 >60 mL/min   GFR calc Af Amer >60 >60 mL/min   Anion gap 6 5 - 15  CBC  Result Value Ref Range   WBC 4.7 4.0 - 10.5 K/uL   RBC 4.33 4.22 - 5.81 MIL/uL   Hemoglobin 12.0 (L) 13.0 - 17.0 g/dL   HCT 19.1 (L) 47.8 - 29.5 %   MCV 89.8 78.0 - 100.0 fL   MCH 27.7 26.0 - 34.0 pg   MCHC 30.8 30.0 - 36.0 g/dL   RDW 62.1 30.8 - 65.7 %   Platelets 233 150 - 400 K/uL  I-stat troponin, ED  Result Value Ref Range   Troponin i, poc 0.15 (HH) 0.00 - 0.08 ng/mL   Comment NOTIFIED PHYSICIAN    Comment 3          POCT Activated clotting time  Result Value Ref Range   Activated Clotting Time 417 seconds  Echocardiogram  Result Value Ref Range   Weight 3,626.13 oz   Height 79 in   BP 126/74 mmHg    Discharge Medications:      Medication List    STOP taking these medications   aspirin 325 MG tablet   naproxen sodium 220 MG tablet Commonly known as:  ANAPROX   rivaroxaban 10 MG Tabs tablet Commonly known as:  XARELTO     TAKE these medications   atorvastatin 40 MG tablet Commonly known as:  LIPITOR Take 1 tablet (40 mg total) by mouth daily at 6 PM.   carvedilol 3.125 MG tablet Commonly known as:  COREG Take 1 tablet (3.125 mg total) by mouth 2 (two) times daily with a meal.   docusate sodium 100 MG capsule Commonly known as:  COLACE Take 1 capsule (100 mg total) by mouth 2 (two) times daily.   doxycycline 100 MG tablet Commonly known as:  VIBRA-TABS Take 1 tablet (100 mg total) by mouth every 12 (  twelve) hours.   methocarbamol 500 MG tablet Commonly known as:  ROBAXIN Take 1 tablet (500 mg total) by mouth every 6 (six) hours as needed for muscle spasms.   nitroGLYCERIN 0.4 MG SL tablet Commonly known as:  NITROSTAT Place 1 tablet (0.4 mg total) under the tongue every 5 (five) minutes x 3 doses as needed for chest pain.   oxyCODONE 5 MG immediate release tablet Commonly known as:  Oxy IR/ROXICODONE Take 1-2 tablets (5-10 mg total) by mouth every 3 (three) hours as needed for breakthrough pain.   ticagrelor 90 MG Tabs tablet Commonly known as:  BRILINTA Take 1 tablet (90 mg total) by mouth 2 (two) times daily.       Diagnostic Studies: Dg Chest 2 View  Result Date: 10/12/2015 CLINICAL DATA:  Chest pain and shortness of breath for 1 day. EXAM: CHEST  2 VIEW COMPARISON:  PA and lateral chest 09/19/2015. FINDINGS: The lungs are clear. No pneumothorax or pleural effusion. Heart size is normal. No focal bony abnormality. IMPRESSION: No acute disease. Electronically Signed   By: Drusilla Kanner M.D.   On: 10/12/2015 18:07   Ct Angio Chest Pe W And/or Wo Contrast  Result Date: 10/12/2015 CLINICAL DATA:  Recent orthopedic surgery. Leg pain, chest pain and shortness of breath. EXAM: CT  ANGIOGRAPHY CHEST WITH CONTRAST TECHNIQUE: Multidetector CT imaging of the chest was performed using the standard protocol during bolus administration of intravenous contrast. Multiplanar CT image reconstructions and MIPs were obtained to evaluate the vascular anatomy. CONTRAST:  100 mL Isovue 370 IV COMPARISON:  Chest radiograph 10/12/2015 FINDINGS: Vascular: No pulmonary embolus. The main pulmonary artery is within normal limits for size. No CT evidence of acute right heart strain. Visualized portion of the aorta is normal. Heart size is normal. Mediastinum/Nodes: No mediastinal or axillary adenopathy. Lungs: No pulmonary nodules or masses. No pleural effusion or focal consolidation. Calcified granuloma in the left lower lobe. Visualized abdomen: Contrast bolus timing is not optimized for evaluation of the abdominal organs. There is a hypo attenuating focus within the right hepatic lobe measuring 3.0 x 2.3 centimeters (series 401, image 154). Visualized upper abdominal structures are otherwise unremarkable. Musculoskeletal: No lytic or blastic osseous lesions. The visualized extrathoracic soft tissues are normal. IMPRESSION: 1. No pulmonary embolus. 2. Clear lungs. 3. Hypoattenuating lesion within the right hepatic lobe, incompletely evaluated on this study. This may be a benign lesion such as a hemangioma. Abdominal MRI with and without contrast could be performed for more complete characterization. Electronically Signed   By: Deatra Robinson M.D.   On: 10/12/2015 22:15    Disposition: 01-Home or Self Care  Discharge Instructions    Call MD / Call 911    Complete by:  As directed    If you experience chest pain or shortness of breath, CALL 911 and be transported to the hospital emergency room.  If you develope a fever above 101 F, pus (white drainage) or increased drainage or redness at the wound, or calf pain, call your surgeon's office.   Constipation Prevention    Complete by:  As directed    Drink  plenty of fluids.  Prune juice may be helpful.  You may use a stool softener, such as Colace (over the counter) 100 mg twice a day.  Use MiraLax (over the counter) for constipation as needed.   Diet - low sodium heart healthy    Complete by:  As directed    Discharge instructions    Complete  by:  As directed    Elevate leg Return to clinic next Wednesday Non weight bearing   Increase activity slowly as tolerated    Complete by:  As directed       Follow-up Information    [facility] Follow up on 10/21/2015.   Why:  10 am for hospital follow up Contact information: [address] [place] [address] [phone]           Signed: Cammy Copa 11/09/2015, 11:20 AM

## 2015-11-17 ENCOUNTER — Encounter: Payer: Self-pay | Admitting: Cardiology

## 2015-11-18 NOTE — Telephone Encounter (Signed)
The patient was started on Seroquel and is experiencing dizziness and is concerned about low blood pressure, please arrange to be seen by a PA in the next few days.  Thank you  Tobias AlexanderKatarina Nelson

## 2015-11-18 NOTE — Telephone Encounter (Signed)
[facility] the pt and schedule for him to see an Extender in our office in the next few days, for complaints of low BP and dizziness, from taking Seroquel.  This is per Dr Delton SeeNelson.

## 2015-11-19 ENCOUNTER — Ambulatory Visit (INDEPENDENT_AMBULATORY_CARE_PROVIDER_SITE_OTHER): Payer: Self-pay | Admitting: Orthopedic Surgery

## 2015-11-19 DIAGNOSIS — L97311 Non-pressure chronic ulcer of right ankle limited to breakdown of skin: Secondary | ICD-10-CM

## 2015-11-19 DIAGNOSIS — S86021D Laceration of right Achilles tendon, subsequent encounter: Secondary | ICD-10-CM

## 2015-11-19 DIAGNOSIS — S86011D Strain of right Achilles tendon, subsequent encounter: Secondary | ICD-10-CM

## 2015-11-19 NOTE — Telephone Encounter (Signed)
Pt is scheduled to see [name] PA-C next Thursday 10/26 at 0800, for complaints mentioned.  Pt [facility] and [facility].

## 2015-11-24 MED FILL — traMADol HCL 50 MG TABS: 50 | 15 days supply | Qty: 30 | Fill #0

## 2015-11-26 ENCOUNTER — Ambulatory Visit: Payer: Self-pay | Admitting: Cardiology

## 2015-11-26 ENCOUNTER — Other Ambulatory Visit (HOSPITAL_COMMUNITY): Payer: Self-pay

## 2015-11-26 ENCOUNTER — Other Ambulatory Visit (HOSPITAL_COMMUNITY): Payer: Self-pay | Admitting: Orthopedic Surgery

## 2015-11-27 NOTE — Telephone Encounter (Signed)
Refusing Rx refill. Patient taking more than the prescribed amount.

## 2015-11-27 NOTE — Telephone Encounter (Signed)
See below, pls advise, Lajoyce CornersDuda is out of the office.

## 2015-11-27 NOTE — Telephone Encounter (Signed)
Pt. Called to see if we received his MyChart prescription refill request. Best call back number is [phone].

## 2015-11-30 ENCOUNTER — Encounter: Payer: Self-pay | Admitting: Pediatric Intensive Care

## 2015-11-30 ENCOUNTER — Ambulatory Visit (INDEPENDENT_AMBULATORY_CARE_PROVIDER_SITE_OTHER): Payer: Self-pay | Admitting: Cardiology

## 2015-11-30 ENCOUNTER — Encounter: Payer: Self-pay | Admitting: Cardiology

## 2015-11-30 VITALS — BP 180/110 | HR 106 | Ht 79.0 in | Wt 229.4 lb

## 2015-11-30 DIAGNOSIS — I10 Essential (primary) hypertension: Secondary | ICD-10-CM

## 2015-11-30 DIAGNOSIS — I214 Non-ST elevation (NSTEMI) myocardial infarction: Secondary | ICD-10-CM

## 2015-11-30 HISTORY — DX: Essential (primary) hypertension: I10

## 2015-11-30 MED ORDER — ASPIRIN EC 81 MG PO TBEC: 81.0000 mg | DELAYED_RELEASE_TABLET | Freq: Every day | ORAL | 3 refills | Status: DC

## 2015-11-30 MED ORDER — CARVEDILOL 6.25 MG PO TABS: 6.2500 mg | ORAL_TABLET | Freq: Two times a day (BID) | ORAL | 6 refills | Status: DC

## 2015-11-30 NOTE — Patient Instructions (Signed)
Medication Instructions:  Increase Carvedilol to 6.25mg  twice daily.   Keep taking Brilinta twice daily.   START Aspirin 81 mg daily.   Follow-Up: Please keep your upcoming appointment with Dr. Delton SeeNelson on 12/14/15 at 8:45 am.   If you need a refill on your cardiac medications before your next appointment, please call your pharmacy.

## 2015-11-30 NOTE — Progress Notes (Signed)
11/30/2015 Brett Salinas   10/08/1970  [id number]  Primary Physician Jacklynn BarnaclePLACEY,MARY H, NP Primary Cardiologist: Dr. Delton SeeNelson    Reason for Visit/CC: Low BP and dizziness   HPI:  [name] recently established are with our practice during recent hospitalization. In summary, he is a 45 year old schizophrenic, bipolar AA male who was recently released from prison. He was living in a hotel when a porcelain sink fell on his right heel, severing his Rt achilles tendon. He underwent I&D and tendon repair 09/19/15. He was discharged 10/01/15. Post op he did not fill his Xarelto Rx but took ASA. He presented to the ED 10/12/15 with exertional chest pain and SOB that had been going on for two days. His drug screen was positive for cocaine. His Troponin came back positive- pk 0.18. Cath done 10/13/15 revealed a 90% proximal LAD and no other significant CAD. He underwent successful PCI with DES. Echo revealed normal left ventricular EF at 60-65%. Mild MR was also noted. He was placed on dual antiplatelet therapy with aspirin and Brilinta as well as carvedilol and atorvastatin. I evaluated him for post hospital f/u on 10/29/15. He was doing well from a cardiac standpoint.  He now presents to clinic with complain of BP issues. He reports he was having low BP and dizziness with Seroquel. He stopped taking this and dizziness improved.   From a cardiac standpoint, he denies any CP. No dyspnea. He has not been fully compliant with meds. He has only been taking his coreg once daily instead of twice. Also only taking Brilinta once daily. He has been taking ASA. He feels that it is "too much blood thinner". We discussed the reason and importance of strict compliance with DAPT to prevent recurrent MI.   His BP is elevated at 180/110. EKG show sinus tach with rate of 106 bpm.    Current Meds  Medication Sig  . atorvastatin (LIPITOR) 40 MG tablet Take 1 tablet (40 mg total) by mouth daily at 6 PM.  . carvedilol (COREG)  3.125 MG tablet Take 1 tablet (3.125 mg total) by mouth 2 (two) times daily with a meal.  . docusate sodium (COLACE) 100 MG capsule Take 1 capsule (100 mg total) by mouth 2 (two) times daily.  Marland Kitchen. doxycycline (VIBRA-TABS) 100 MG tablet Take 1 tablet (100 mg total) by mouth every 12 (twelve) hours.  . methocarbamol (ROBAXIN) 500 MG tablet Take 1 tablet (500 mg total) by mouth every 6 (six) hours as needed for muscle spasms.  . nitroGLYCERIN (NITROSTAT) 0.4 MG SL tablet Place 1 tablet (0.4 mg total) under the tongue every 5 (five) minutes x 3 doses as needed for chest pain.  Marland Kitchen. oxyCODONE (OXY IR/ROXICODONE) 5 MG immediate release tablet Take 1-2 tablets (5-10 mg total) by mouth every 3 (three) hours as needed for breakthrough pain.  . ticagrelor (BRILINTA) 90 MG TABS tablet Take 1 tablet (90 mg total) by mouth 2 (two) times daily.  . traMADol (ULTRAM) 50 MG tablet Take by mouth 2 (two) times daily.   Allergies  Allergen Reactions  . 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 Pork-Derived Products Itching    Burning/itching  . Heparin Itching    Full-body itching inc throat   Past Medical History:  Diagnosis Date  . Arthritis    "hips, knees, back" (10/13/2015)  . Bipolar disorder (HCC)   . CHF (congestive heart failure) (HCC)   . Coronary artery disease   . Depression   . DVT (deep venous thrombosis) (HCC)  09/2015   right  . Heart murmur   . High cholesterol   . Hypertension   . Myocardial infarction 10/12/2015 X 2-3  . Rectal myiasis   . Schizophrenia (HCC)   . Sciatica    History reviewed. No pertinent family history. Past Surgical History:  Procedure Laterality Date  . CARDIAC CATHETERIZATION N/A 10/13/2015   Procedure: Left Heart Cath and Coronary Angiography;  Surgeon: Corky Crafts, MD;  Location: [facility] INVASIVE CV LAB;  Service: Cardiovascular;  Laterality: N/A;  . CARDIAC CATHETERIZATION N/A 10/13/2015   Procedure: Coronary Stent Intervention;  Surgeon: Corky Crafts, MD;   Location: [facility] INVASIVE CV LAB;  Service: Cardiovascular;  Laterality: N/A;  . CARDIAC CATHETERIZATION N/A 10/13/2015   Procedure: Intravascular Ultrasound/IVUS;  Surgeon: Corky Crafts, MD;  Location: [facility] INVASIVE CV LAB;  Service: Cardiovascular;  Laterality: N/A;  . CORONARY ANGIOPLASTY WITH STENT PLACEMENT  10/13/2015  . I&D EXTREMITY Right 09/19/2015   Procedure: IRRIGATION AND DEBRIDEMENT ANKLE LACERATIONS POSSIBLE TENDON REPAIR;  Surgeon: Cammy Copa, MD;  Location: [facility] OR;  Service: Orthopedics;  Laterality: Right;  . I&D EXTREMITY Right 10/14/2015   Procedure: IRRIGATION AND DEBRIDEMENT EXTREMITY/Right Ankle;  Surgeon: Cammy Copa, MD;  Location: [facility] OR;  Service: Orthopedics;  Laterality: Right;   Social History   Social History  . Marital status: Single    Spouse name: N/A  . Number of children: N/A  . Years of education: N/A   Occupational History  . Not on file.   Social History Main Topics  . Smoking status: Never Smoker  . Smokeless tobacco: Never Used  . Alcohol use Yes     Comment: 10/13/2015 "might have a drink q blue moon"  . Drug use:      Comment: 10/13/2015 "I've tried alot; I don't have any habits"  . Sexual activity: Not on file   Other Topics Concern  . Not on file   Social History Narrative  . No narrative on file     Review of Systems: General: negative for chills, fever, night sweats or weight changes.  Cardiovascular: negative for chest pain, dyspnea on exertion, edema, orthopnea, palpitations, paroxysmal nocturnal dyspnea or shortness of breath Dermatological: negative for rash Respiratory: negative for cough or wheezing Urologic: negative for hematuria Abdominal: negative for nausea, vomiting, diarrhea, bright red blood per rectum, melena, or hematemesis Neurologic: negative for visual changes, syncope, or dizziness All other systems reviewed and are otherwise negative except as noted above.   Physical Exam:  Height 6\' 7"  (2.007  m), weight 229 lb 6 oz (104 kg).  General appearance: alert, cooperative, no distress Neck: no carotid bruit and no JVD Lungs: clear to auscultation bilaterally Heart: regular rhythm, tachy rate, S1, S2 normal, no murmur, click, rub or gallop Extremities: no LEE Pulses: 2+ and symmetric Skin: warm and dry Neurologic:  Grossly normal  EKG sinus tachy 106 bpm   ASSESSMENT AND PLAN:   1. CAD: s/p  NSTMI 2/2 90% LAD stenosis. S/p PCI + DES. Stable w/o CP. He has not been fully compliant with DAPT. He thinks it will be "too much blood thinner". Pt educated on the importance of DAPT with both Brilinta and low dose baby ASA daily to prevent in-stent thrombosis/ recurrent MI.  He has been compliant with atorvastatin. Not compliant with BID dosing of Coreg. Patient advised to take twice daily. EF normal at 60-65%. He has PRN SLNTG and understands proper use and when to call 911.   2.  Achilles Tendon Tear: traumatic injury. S/p surgery/ I&D. Now bearing full weight. No longer on crutches.   3. HTN: BP elevated in clinic today. He has only been taking Coreg once daily. Patient education on medications. He was instructed to start taking twice daily. We will also increase the dose to 6.25 mg BID. This will also help his HR. He has f/u in 2 weeks with Dr. Delton SeeNelson.   4. DLD: recent LDL 99 mg/dL. Goal given CAD is <70 mg/dL. Continue atorvastatin. Recheck FLP and HFTs at next f/u with Dr. Delton SeeNelson in 2 weeks.   5. Dizziness/ Low BP: improved since stopping Seroquel. He was advised to f/u with his PCP.   PLAN  Keep f/u with Dr. Delton SeeNelson in 2 weeks.   Robbie LisBrittainy Lanika Colgate PA-C 11/30/2015 3:51 PM

## 2015-12-04 ENCOUNTER — Encounter (HOSPITAL_BASED_OUTPATIENT_CLINIC_OR_DEPARTMENT_OTHER): Payer: Self-pay | Attending: Internal Medicine

## 2015-12-04 DIAGNOSIS — Y838 Other surgical procedures as the cause of abnormal reaction of the patient, or of later complication, without mention of misadventure at the time of the procedure: Secondary | ICD-10-CM | POA: Insufficient documentation

## 2015-12-04 DIAGNOSIS — Z86718 Personal history of other venous thrombosis and embolism: Secondary | ICD-10-CM | POA: Insufficient documentation

## 2015-12-04 DIAGNOSIS — T8131XA Disruption of external operation (surgical) wound, not elsewhere classified, initial encounter: Secondary | ICD-10-CM | POA: Insufficient documentation

## 2015-12-04 DIAGNOSIS — I2510 Atherosclerotic heart disease of native coronary artery without angina pectoris: Secondary | ICD-10-CM | POA: Insufficient documentation

## 2015-12-07 ENCOUNTER — Encounter (HOSPITAL_COMMUNITY): Payer: Self-pay | Admitting: Emergency Medicine

## 2015-12-07 ENCOUNTER — Encounter: Payer: Self-pay | Admitting: Pediatric Intensive Care

## 2015-12-07 ENCOUNTER — Emergency Department (HOSPITAL_COMMUNITY): Payer: Self-pay

## 2015-12-07 ENCOUNTER — Emergency Department (HOSPITAL_COMMUNITY)
Admission: EM | Admit: 2015-12-07 | Discharge: 2015-12-08 | Disposition: A | Discharge status: Home / Self Care | Payer: Self-pay | Attending: Emergency Medicine | Admitting: Emergency Medicine

## 2015-12-07 DIAGNOSIS — Z7982 Long term (current) use of aspirin: Secondary | ICD-10-CM | POA: Insufficient documentation

## 2015-12-07 DIAGNOSIS — R079 Chest pain, unspecified: Secondary | ICD-10-CM | POA: Insufficient documentation

## 2015-12-07 DIAGNOSIS — I252 Old myocardial infarction: Secondary | ICD-10-CM | POA: Insufficient documentation

## 2015-12-07 DIAGNOSIS — Z79899 Other long term (current) drug therapy: Secondary | ICD-10-CM | POA: Insufficient documentation

## 2015-12-07 DIAGNOSIS — I2510 Atherosclerotic heart disease of native coronary artery without angina pectoris: Secondary | ICD-10-CM | POA: Insufficient documentation

## 2015-12-07 DIAGNOSIS — I509 Heart failure, unspecified: Secondary | ICD-10-CM | POA: Insufficient documentation

## 2015-12-07 DIAGNOSIS — Z955 Presence of coronary angioplasty implant and graft: Secondary | ICD-10-CM | POA: Insufficient documentation

## 2015-12-07 DIAGNOSIS — I110 Hypertensive heart disease with heart failure: Secondary | ICD-10-CM | POA: Insufficient documentation

## 2015-12-07 LAB — CBC
HEMATOCRIT: 39.8 % (ref 39.0–52.0)
HEMOGLOBIN: 13.2 g/dL (ref 13.0–17.0)
MCH: 28.2 pg (ref 26.0–34.0)
MCHC: 33.2 g/dL (ref 30.0–36.0)
MCV: 85 fL (ref 78.0–100.0)
PLATELETS: 222 10*3/uL (ref 150–400)
RBC: 4.68 MIL/uL (ref 4.22–5.81)
RDW: 14.2 % (ref 11.5–15.5)
WBC: 7.4 10*3/uL (ref 4.0–10.5)

## 2015-12-07 LAB — RAPID URINE DRUG SCREEN, HOSP PERFORMED
AMPHETAMINES: NOT DETECTED
BARBITURATES: NOT DETECTED
BENZODIAZEPINES: NOT DETECTED
Cocaine: POSITIVE — AB
Opiates: NOT DETECTED
TETRAHYDROCANNABINOL: NOT DETECTED

## 2015-12-07 LAB — I-STAT TROPONIN, ED: TROPONIN I, POC: 0.05 ng/mL (ref 0.00–0.08)

## 2015-12-07 NOTE — ED Provider Notes (Signed)
[facility]-EMERGENCY DEPT Provider Note   CSN: [id number] Arrival date & time: 12/07/15  2116     History   Chief Complaint Chief Complaint  Patient presents with  . Paranoid  . Loss of Consciousness    HPI Brett Salinas is a 45 y.o. male.  HPI   45 year old male with a history of schizophrenia, bipolar, and recent MI presents to the ED by EMS for possible syncope. Patient reports that he feels like he went to times continue on. He reports that he went into the [employer] bathroom at approximately noon and seconds later walked out, noting that it was dark. He found security guard who told him that it was 8:00 PM. Patient denies any associated headaches, chest pain, shortness of breath, palpitations, falls, injuries. He reports that he has been to the times base continue [place] before and has had similar episodes in the past. Patient is currently endorsing mild headache and chest pain which started after he got here to the ED. Chest pain is substernal in nature feels like tightness, mild, nonradiating, with no associated shortness of breath, nausea, or diaphoresis. Patient also endorsing right foot pain which also began after he got to the ED. Patient has had several surgeries to that foot and reports that it feels better after cleaning it. Denies any other physical complaints. Patient denies any illicit drug use.  Past Medical History:  Diagnosis Date  . Arthritis    "hips, knees, back" (10/13/2015)  . Bipolar disorder (HCC)   . CHF (congestive heart failure) (HCC)   . Coronary artery disease   . Depression   . DVT (deep venous thrombosis) (HCC) 09/2015   right  . Heart murmur   . High cholesterol   . Hypertension   . Myocardial infarction 10/12/2015 X 2-3  . Rectal myiasis   . Schizophrenia (HCC)   . Sciatica     Patient Active Problem List   Diagnosis Date Noted  . Essential hypertension 11/30/2015  . CAD -S/P PCI LAD/DES 10/13/15 10/14/2015  . Wound infection- for I&D achilles wound  10/14/15 10/14/2015  . Cocaine abuse-drug sceen positive 10/14/2015  . Schizoaffective disorder, bipolar type (HCC) 10/14/2015  . Infection 10/14/2015  . NSTEMI (non-ST elevated myocardial infarction) (HCC)   . Chest pain 10/12/2015  . Achilles tendon rupture-surgery 09/19/15 09/19/2015    Past Surgical History:  Procedure Laterality Date  . CARDIAC CATHETERIZATION N/A 10/13/2015   Procedure: Left Heart Cath and Coronary Angiography;  Surgeon: Corky CraftsJayadeep S Varanasi, MD;  [facility];  Service: Cardiovascular;  Laterality: N/A;  . CARDIAC CATHETERIZATION N/A 10/13/2015   Procedure: Coronary Stent Intervention;  Surgeon: Corky CraftsJayadeep S Varanasi, MD;  [facility];  Service: Cardiovascular;  Laterality: N/A;  . CARDIAC CATHETERIZATION N/A 10/13/2015   Procedure: Intravascular Ultrasound/IVUS;  Surgeon: Corky CraftsJayadeep S Varanasi, MD;  [facility];  Service: Cardiovascular;  Laterality: N/A;  . CORONARY ANGIOPLASTY WITH STENT PLACEMENT  10/13/2015  . I&D EXTREMITY Right 09/19/2015   Procedure: IRRIGATION AND DEBRIDEMENT ANKLE LACERATIONS POSSIBLE TENDON REPAIR;  Surgeon: Cammy CopaScott Gregory Dean, MD;  Location: [facility] OR;  Service: Orthopedics;  Laterality: Right;  . I&D EXTREMITY Right 10/14/2015   Procedure: IRRIGATION AND DEBRIDEMENT EXTREMITY/Right Ankle;  Surgeon: Cammy CopaScott Gregory Dean, MD;  Location: [facility] OR;  Service: Orthopedics;  Laterality: Right;       Home Medications    Prior to Admission medications   Medication Sig Start Date End Date Taking? Authorizing Provider  aspirin EC  81 MG tablet Take 1 tablet (81 mg total) by mouth daily. 11/30/15   Brittainy Sherlynn Carbon, PA-C  atorvastatin (LIPITOR) 40 MG tablet Take 1 tablet (40 mg total) by mouth daily at 6 PM. 10/15/15   Cammy Copa, MD  carvedilol (COREG) 6.25 MG tablet Take 1 tablet (6.25 mg total) by mouth 2 (two) times daily with a meal. 11/30/15   Brittainy Sherlynn Carbon, PA-C  docusate sodium (COLACE) 100  MG capsule Take 1 capsule (100 mg total) by mouth 2 (two) times daily. 09/24/15   Cammy Copa, MD  doxycycline (VIBRA-TABS) 100 MG tablet Take 1 tablet (100 mg total) by mouth every 12 (twelve) hours. 10/15/15   Cammy Copa, MD  methocarbamol (ROBAXIN) 500 MG tablet Take 1 tablet (500 mg total) by mouth every 6 (six) hours as needed for muscle spasms. 09/24/15   Cammy Copa, MD  nitroGLYCERIN (NITROSTAT) 0.4 MG SL tablet Place 1 tablet (0.4 mg total) under the tongue every 5 (five) minutes x 3 doses as needed for chest pain. 10/15/15   Cammy Copa, MD  oxyCODONE (OXY IR/ROXICODONE) 5 MG immediate release tablet Take 1-2 tablets (5-10 mg total) by mouth every 3 (three) hours as needed for breakthrough pain. 10/15/15   Cammy Copa, MD  ticagrelor (BRILINTA) 90 MG TABS tablet Take 1 tablet (90 mg total) by mouth 2 (two) times daily. 10/15/15   Cammy Copa, MD  traMADol (ULTRAM) 50 MG tablet Take by mouth 2 (two) times daily.    Historical Provider, MD    Family History No family history on file.  Social History Social History  Substance Use Topics  . Smoking status: Never Smoker  . Smokeless tobacco: Never Used  . Alcohol use Yes     Comment: 10/13/2015 "might have a drink q blue moon"     Allergies   Catfish [fish allergy]; Heparin; and Pork-derived products   Review of Systems Review of Systems Ten systems are reviewed and are negative for acute change except as noted in the HPI   Physical Exam Updated Vital Signs BP 153/98 (BP Location: Right Arm)   Pulse 95   Temp 98.6 F (37 C) (Oral)   Resp 13   SpO2 98%   Physical Exam  Constitutional: He is oriented to person, place, and time. He appears well-developed and well-nourished. No distress.  HENT:  Head: Normocephalic and atraumatic.  Nose: Nose normal.  Eyes: Conjunctivae and EOM are normal. Pupils are equal, round, and reactive to light. Right eye exhibits no discharge. Left eye  exhibits no discharge. No scleral icterus.  Neck: Normal range of motion. Neck supple.  Cardiovascular: Normal rate and regular rhythm.  Exam reveals no gallop and no friction rub.   No murmur heard. Pulmonary/Chest: Effort normal and breath sounds normal. No stridor. No respiratory distress. He has no rales.  Abdominal: Soft. He exhibits no distension. There is no tenderness.  Musculoskeletal: He exhibits no edema or tenderness.       Right foot: There is no tenderness and no bony tenderness.       Feet:  Neurological: He is alert and oriented to person, place, and time.  Skin: Skin is warm and dry. No rash noted. He is not diaphoretic. No erythema.  Psychiatric: He has a normal mood and affect.  Vitals reviewed.    ED Treatments / Results  Labs (all labs ordered are listed, but only abnormal results are displayed) Labs Reviewed  COMPREHENSIVE METABOLIC  PANEL - Abnormal; Notable for the following:       Result Value   CO2 19 (*)    Total Bilirubin 1.3 (*)    All other components within normal limits  RAPID URINE DRUG SCREEN, HOSP PERFORMED - Abnormal; Notable for the following:    Cocaine POSITIVE (*)    All other components within normal limits  CBC  I-STAT TROPOININ, ED    EKG  EKG Interpretation  Date/Time:  Monday December 07 2015 21:53:25 EST Ventricular Rate:  94 PR Interval:    QRS Duration: 93 QT Interval:  352 QTC Calculation: 441 R Axis:   87 Text Interpretation:  Age not entered, assumed to be  45 years old for purpose of ECG interpretation Sinus rhythm Consider left atrial enlargement Consider left ventricular hypertrophy ST elev, probable normal early repol pattern improved T wave changes when compared to last EKG on 10/14/15.  Similar to EKG from 09/19/15. Confirmed by [facility] Day Surgery LcCARDAMA MD, PEDRO [phone]([id number]) on 12/08/2015 12:17:55 AM       Radiology Dg Chest 2 View  Result Date: 12/07/2015 CLINICAL DATA:  Right-sided chest pain beginning today. EXAM: CHEST  2 VIEW  COMPARISON:  10/12/2015 FINDINGS: The heart size and mediastinal contours are within normal limits. Both lungs are clear. The visualized skeletal structures are unremarkable. IMPRESSION: No active cardiopulmonary disease. Electronically Signed   By: Burman NievesWilliam  Stevens M.D.   On: 12/07/2015 23:02   Ct Head Wo Contrast  Result Date: 12/07/2015 CLINICAL DATA:  Loss of consciousness. Paranoia and visual hallucinations. EXAM: CT HEAD WITHOUT CONTRAST TECHNIQUE: Contiguous axial images were obtained from the base of the skull through the vertex without intravenous contrast. COMPARISON:  None. FINDINGS: BRAIN: The ventricles and sulci are normal. No intraparenchymal hemorrhage, mass effect nor midline shift. No acute large vascular territory infarcts. No abnormal extra-axial fluid collections. Basal cisterns are patent. VASCULAR: Unremarkable. SKULL/SOFT TISSUES: No skull fracture. No significant soft tissue swelling. ORBITS/SINUSES: The included ocular globes and orbital contents are normal.The mastoid aircells and included paranasal sinuses are well-aerated. OTHER: None. IMPRESSION: Normal head CT Electronically Signed   By: Tollie Ethavid  Kwon M.D.   On: 12/07/2015 23:33    Procedures Procedures (including critical care time)  Medications Ordered in ED Medications - No data to display   Initial Impression / Assessment and Plan / ED Course  I have reviewed the triage vital signs and the nursing notes.  Pertinent labs & imaging results that were available during my care of the patient were reviewed by me and considered in my medical decision making (see chart for details).  Clinical Course     Exam is nonfocal. CT head unremarkable. Given the patient's cardiac history we'll obtain cardiac workup. EKG without acute ischemic changes. Initial troponin negative. Negative chest x-ray for chest pain. Given the fact that the incident occurred approximately 10 hours prior to arrival, I feel that obtaining a second  troponin would rule out ACS. Low suspicion for pulmonary embolism. Presentation is classic for aortic dissection or esophageal perforation. We'll continue to monitor the patient on telemetry to assess for any dysrhythmias. If patient remains stable and workup is negative feel he would be appropriate for discharge.  Final Clinical Impressions(s) / ED Diagnoses   Final diagnoses:  Chest pain   Patient care turned over to Dr Wilkie AyeHorton at 0100. Patient case and results discussed in detail; please see their note for further ED managment.       Nira ConnPedro Eduardo Cardama, MD 12/08/15 [phone]

## 2015-12-07 NOTE — ED Notes (Signed)
Pt calm at this time, but is pacing around his room asking for something to drink.

## 2015-12-07 NOTE — ED Notes (Signed)
Patient transported to X-ray 

## 2015-12-07 NOTE — ED Triage Notes (Signed)
Per EMS, pt called from [employer] stating he had gone in the bathroom while it was still daylight and came out when it was dark, stating he couldn't remember what had happened during that time. Pt states he "passed out". Per EMS, pt is [employer] x 4, paranoid and having visual hallucinations. VSS, CBG-100. Hx schizophrenia.

## 2015-12-07 NOTE — ED Notes (Signed)
Pt back in room, No distress observed.

## 2015-12-07 NOTE — ED Notes (Signed)
Pt presents with c/o losing time, consistent with EMS report.  Pt also c/o leg pain (7/10), headache "since getting in the ambulance," and chest pain and "low oxygen" when various people enter the room.  Pt wants to eat and drink, MD okayed this.

## 2015-12-08 LAB — COMPREHENSIVE METABOLIC PANEL
ALT: 24 U/L (ref 17–63)
ANION GAP: 14 (ref 5–15)
AST: 30 U/L (ref 15–41)
Albumin: 3.8 g/dL (ref 3.5–5.0)
Alkaline Phosphatase: 52 U/L (ref 38–126)
BUN: 15 mg/dL (ref 6–20)
CHLORIDE: 107 mmol/L (ref 101–111)
CO2: 19 mmol/L — AB (ref 22–32)
CREATININE: 1.23 mg/dL (ref 0.61–1.24)
Calcium: 9.7 mg/dL (ref 8.9–10.3)
GFR calc non Af Amer: >60 mL/min (ref >60)
Glucose, Bld: 79 mg/dL (ref 65–99)
Potassium: 3.9 mmol/L (ref 3.5–5.1)
SODIUM: 140 mmol/L (ref 135–145)
Total Bilirubin: 1.3 mg/dL — ABNORMAL HIGH (ref 0.3–1.2)
Total Protein: 6.8 g/dL (ref 6.5–8.1)

## 2015-12-08 LAB — I-STAT TROPONIN, ED: Troponin i, poc: 0.06 ng/mL (ref 0.00–0.08)

## 2015-12-08 MED ORDER — ACETAMINOPHEN 325 MG PO TABS
325.0000 mg | ORAL_TABLET | Freq: Once | ORAL | Status: AC
  Administered 2015-12-08: 325 mg via ORAL
  Filled 2015-12-08: qty 1

## 2015-12-08 MED ORDER — ACETAMINOPHEN 325 MG PO TABS
650.0000 mg | ORAL_TABLET | Freq: Once | ORAL | Status: AC
  Administered 2015-12-08: 650 mg via ORAL
  Filled 2015-12-08: qty 2

## 2015-12-08 NOTE — ED Provider Notes (Signed)
Patient signed out pending repeat troponin. History of paranoia. Recent history of an in STEMI. Chest pain since yesterday midday. Initial workup by Dr. Eudelia Bunchardama is reassuring including EKG and troponin. Plan for repeat troponin. Patient states asymptomatic can be discharged and follow-up with cardiology. Repeat troponin reassuring.   After history, exam, and medical workup I feel the patient has been appropriately medically screened and is safe for discharge home. Pertinent diagnoses were discussed with the patient. Patient was given return precautions.    Shon Batonourtney F Javel Hersh, MD 12/08/15 [phone]

## 2015-12-08 NOTE — Discharge Instructions (Signed)
You were seen today for chest pain. Your workup is reassuring. You need follow-up with your cardiologist as soon as possible. If he has no worsening symptoms he should be reevaluated.

## 2015-12-09 ENCOUNTER — Encounter (INDEPENDENT_AMBULATORY_CARE_PROVIDER_SITE_OTHER): Payer: Self-pay | Admitting: Family

## 2015-12-09 ENCOUNTER — Ambulatory Visit (INDEPENDENT_AMBULATORY_CARE_PROVIDER_SITE_OTHER): Payer: Self-pay | Admitting: Family

## 2015-12-09 VITALS — Ht 79.0 in | Wt 229.0 lb

## 2015-12-09 DIAGNOSIS — T148XXA Other injury of unspecified body region, initial encounter: Secondary | ICD-10-CM

## 2015-12-09 DIAGNOSIS — L089 Local infection of the skin and subcutaneous tissue, unspecified: Secondary | ICD-10-CM

## 2015-12-09 DIAGNOSIS — S86011D Strain of right Achilles tendon, subsequent encounter: Secondary | ICD-10-CM

## 2015-12-09 NOTE — Congregational Nurse Program (Signed)
Congregational Nurse Program Note  Date of Encounter: 11/09/2015  Past Medical History: Past Medical History:  Diagnosis Date  . Arthritis    "hips, knees, back" (10/13/2015)  . Bipolar disorder (HCC)   . CHF (congestive heart failure) (HCC)   . Coronary artery disease   . Depression   . DVT (deep venous thrombosis) (HCC) 09/2015   right  . Heart murmur   . High cholesterol   . Hypertension   . Myocardial infarction 10/12/2015 X 2-3  . Rectal myiasis   . Schizophrenia (HCC)   . Sciatica     Encounter Details:  Client visit to discuss wound care and for BP check. Client states that he is having increasing pain in ankle and that he does not have access to pain medication. CN recommended that client discuss with his surgeon or with would care clinic. Client is agitated and states that he has heart "problems" because he feels the pain of others. Client states that he has a "priest" who helps him with transportation and medication costs. Client will follow up with CN as needed.

## 2015-12-09 NOTE — Congregational Nurse Program (Signed)
Congregational Nurse Program Note  Date of Encounter: 11/03/2015  Past Medical History: Past Medical History:  Diagnosis Date  . Arthritis    "hips, knees, back" (10/13/2015)  . Bipolar disorder (HCC)   . CHF (congestive heart failure) (HCC)   . Coronary artery disease   . Depression   . DVT (deep venous thrombosis) (HCC) 09/2015   right  . Heart murmur   . High cholesterol   . Hypertension   . Myocardial infarction 10/12/2015 X 2-3  . Rectal myiasis   . Schizophrenia (HCC)   . Sciatica     Encounter Details:  New client- client states that he had achilles tendon rupture with repair and subsequent infection in August. Client also reports a history of old MI and hypertension. Client would like would care supplies. Client has old, healing wound below right ankle- no drainage or s/s infection noted. Client sees wound care clinic for debridement. Given 4x4s, tape and wrap. Client will return next week and prn.

## 2015-12-09 NOTE — Congregational Nurse Program (Signed)
Congregational Nurse Program Note  Date of Encounter: 11/30/2015  Past Medical History: Past Medical History:  Diagnosis Date  . Arthritis    "hips, knees, back" (10/13/2015)  . Bipolar disorder (HCC)   . CHF (congestive heart failure) (HCC)   . Coronary artery disease   . Depression   . DVT (deep venous thrombosis) (HCC) 09/2015   right  . Heart murmur   . High cholesterol   . Hypertension   . Myocardial infarction 10/12/2015 X 2-3  . Rectal myiasis   . Schizophrenia (HCC)   . Sciatica     Encounter Details:     CNP Questionnaire - 11/30/15 0845      Patient Demographics   Is this a new or existing patient? Existing   Patient is considered a/an Not Applicable   Race African-American/Black     Patient Assistance   Location of Patient Assistance [facility]   Patient's financial/insurance status Self-Pay (Uninsured)   Uninsured Patient ([employer] officer, trade unionCard/Care Connects) Yes   Interventions Appt. has been completed   Patient referred to apply for the following financial assistance Medicaid   Food insecurities addressed Not Applicable   Transportation assistance No   Type of Assistance Other   Assistance securing medications No   Product/process development scientistducational health offerings Navigating the healthcare system;Other     Encounter Details   Primary purpose of visit Other   Was an Emergency Department visit averted? Not Applicable   Does patient have a medical provider? Yes   Patient referred to Follow up with established PCP   Was a mental health screening completed? (GAINS tool) No   Does patient have dental issues? No   Does patient have vision issues? No   Does your patient have an abnormal blood pressure today? No   Since previous encounter, have you referred patient for abnormal blood pressure that resulted in a new diagnosis or medication change? No   Does your patient have an abnormal blood glucose today? No   Since previous encounter, have you referred patient for abnormal blood glucose that  resulted in a new diagnosis or medication change? No   Was there a life-saving intervention made? No     Client in office c/o pain in foot and ankle at site of surgery. Client states that he has gone back to work and has increased pain after working. Client reports that he is taking Tramadol 50 mg 2 x day but that gives little relief. Client has discussed with wound care MD. [facility] CN recommended discussing pain issue with NP at that appointment as CN does not prescribe pain medication. Client also has surgery follow up on 11/13. Client is agitated during visit. Encouraged to follow up as needed.

## 2015-12-09 NOTE — Progress Notes (Signed)
Office Visit Note   Patient: Brett BinetDerrick Salinas           Date of Birth: 03/23/1970           MRN: [id number] Visit Date: 12/09/2015              Requested by: No referring provider defined for this encounter. PCP: Jacklynn BarnaclePLACEY,MARY H, NP   Assessment & Plan: Visit Diagnoses:  1. Rupture of right Achilles tendon, subsequent encounter   2. Wound infection- for I&D achilles wound 10/14/15     Plan: Recommended he work on heel cord stretching and scar massage daily. Continue daily wound care. States like to continue seeing Dr. Roxan Hockeyobinson at the wound center once weekly for wound debridement. Discussed that we can no longer refill his narcotics. Recommended he get in with pain management if he feels he needs narcotics chronic basis.  Follow-Up Instructions: Return in about 2 weeks (around 12/23/2015).   Orders:  No orders of the defined types were placed in this encounter.  No orders of the defined types were placed in this encounter.     Procedures: No procedures performed   Clinical Data: No additional findings.   Subjective: Chief Complaint  Patient presents with  . Right Foot - Routine Post Op    09/19/15 right excision and debridement of complex laceration achilles tendon repair    Wound is all but closed. The pt has a very small less that pea sized are that is open at the incision. He has a very tiny piece of silver alginate dressing covered with an ABD pad applied. No drainage, no redness no odor and no swelling. The pt is weight bearing with one crutch and he states that he is having a lot of pain and feels that he would be able to heal faster if he had some pain medication. He is not taking any of his prescribed medications and does not voice any other concers.    Review of Systems  Constitutional: Negative for chills and fever.  All other systems reviewed and are negative.    Objective: Vital Signs: Ht 6\' 7"  (2.007 m)   Wt 229 lb (103.9 kg)   BMI 25.80 kg/m    Physical Exam  Ortho Exam  In right foot Achilles repair incision nearly healed. Does have some keloiding of the scar. There is a 5 mm x 2 mm open area. This has some dried serous drainage. No purulence maceration or surrounding erythema. She needs to have heel cord tightness with dorsiflexion to 90.  Specialty Comments:  No specialty comments available.  Imaging: No results found.   PMFS History: Patient Active Problem List   Diagnosis Date Noted  . Essential hypertension 11/30/2015  . CAD -S/P PCI LAD/DES 10/13/15 10/14/2015  . Wound infection- for I&D achilles wound 10/14/15 10/14/2015  . Cocaine abuse-drug sceen positive 10/14/2015  . Schizoaffective disorder, bipolar type (HCC) 10/14/2015  . Infection 10/14/2015  . NSTEMI (non-ST elevated myocardial infarction) (HCC)   . Chest pain 10/12/2015  . Achilles tendon rupture-surgery 09/19/15 09/19/2015   Past Medical History:  Diagnosis Date  . Arthritis    "hips, knees, back" (10/13/2015)  . Bipolar disorder (HCC)   . CHF (congestive heart failure) (HCC)   . Coronary artery disease   . Depression   . DVT (deep venous thrombosis) (HCC) 09/2015   right  . Heart murmur   . High cholesterol   . Hypertension   . Myocardial infarction 10/12/2015 X 2-3  .  Rectal myiasis   . Schizophrenia (HCC)   . Sciatica     No family history on file.  Past Surgical History:  Procedure Laterality Date  . CARDIAC CATHETERIZATION N/A 10/13/2015   Procedure: Left Heart Cath and Coronary Angiography;  Surgeon: Corky CraftsJayadeep S Varanasi, MD;  [facility];  Service: Cardiovascular;  Laterality: N/A;  . CARDIAC CATHETERIZATION N/A 10/13/2015   Procedure: Coronary Stent Intervention;  Surgeon: Corky CraftsJayadeep S Varanasi, MD;  [facility];  Service: Cardiovascular;  Laterality: N/A;  . CARDIAC CATHETERIZATION N/A 10/13/2015   Procedure: Intravascular Ultrasound/IVUS;  Surgeon: Corky CraftsJayadeep S Varanasi, MD;  [facility];  Service: Cardiovascular;  Laterality: N/A;  . CORONARY ANGIOPLASTY WITH STENT PLACEMENT  10/13/2015  . I&D EXTREMITY Right 09/19/2015   Procedure: IRRIGATION AND DEBRIDEMENT ANKLE LACERATIONS POSSIBLE TENDON REPAIR;  Surgeon: Cammy CopaScott Gregory Dean, MD;  Location: [facility] OR;  Service: Orthopedics;  Laterality: Right;  . I&D EXTREMITY Right 10/14/2015   Procedure: IRRIGATION AND DEBRIDEMENT EXTREMITY/Right Ankle;  Surgeon: Cammy CopaScott Gregory Dean, MD;  Location: [facility] OR;  Service: Orthopedics;  Laterality: Right;   Social History   Occupational History  . Not on file.   Social History Main Topics  . Smoking status: Never Smoker  . Smokeless tobacco: Never Used  . Alcohol use Yes     Comment: 10/13/2015 "might have a drink q blue moon"  . Drug use:      Comment: 10/13/2015 "I've tried alot; I don't have any habits"  . Sexual activity: Not on file

## 2015-12-11 ENCOUNTER — Encounter: Payer: Self-pay | Admitting: Pediatric Intensive Care

## 2015-12-14 ENCOUNTER — Ambulatory Visit (INDEPENDENT_AMBULATORY_CARE_PROVIDER_SITE_OTHER): Payer: Self-pay | Admitting: Cardiology

## 2015-12-14 ENCOUNTER — Ambulatory Visit (INDEPENDENT_AMBULATORY_CARE_PROVIDER_SITE_OTHER): Payer: Self-pay | Admitting: Orthopedic Surgery

## 2015-12-14 ENCOUNTER — Encounter: Payer: Self-pay | Admitting: Cardiology

## 2015-12-14 ENCOUNTER — Ambulatory Visit (INDEPENDENT_AMBULATORY_CARE_PROVIDER_SITE_OTHER): Payer: MEDICAID | Admitting: Orthopedic Surgery

## 2015-12-14 ENCOUNTER — Encounter (INDEPENDENT_AMBULATORY_CARE_PROVIDER_SITE_OTHER): Payer: Self-pay | Admitting: Orthopedic Surgery

## 2015-12-14 ENCOUNTER — Ambulatory Visit: Payer: Self-pay | Attending: Internal Medicine

## 2015-12-14 VITALS — BP 150/90 | HR 68 | Ht 79.0 in | Wt 220.0 lb

## 2015-12-14 DIAGNOSIS — M7661 Achilles tendinitis, right leg: Secondary | ICD-10-CM

## 2015-12-14 DIAGNOSIS — I10 Essential (primary) hypertension: Secondary | ICD-10-CM

## 2015-12-14 DIAGNOSIS — Z9861 Coronary angioplasty status: Secondary | ICD-10-CM

## 2015-12-14 DIAGNOSIS — E782 Mixed hyperlipidemia: Secondary | ICD-10-CM

## 2015-12-14 DIAGNOSIS — I214 Non-ST elevation (NSTEMI) myocardial infarction: Secondary | ICD-10-CM

## 2015-12-14 DIAGNOSIS — Z789 Other specified health status: Secondary | ICD-10-CM

## 2015-12-14 DIAGNOSIS — I5033 Acute on chronic diastolic (congestive) heart failure: Secondary | ICD-10-CM

## 2015-12-14 DIAGNOSIS — I2510 Atherosclerotic heart disease of native coronary artery without angina pectoris: Secondary | ICD-10-CM

## 2015-12-14 DIAGNOSIS — Z9114 Patient's other noncompliance with medication regimen: Secondary | ICD-10-CM

## 2015-12-14 MED ORDER — ASPIRIN EC 81 MG PO TBEC: 81.0000 mg | DELAYED_RELEASE_TABLET | Freq: Every day | ORAL | 3 refills | Status: DC

## 2015-12-14 MED ORDER — CLOPIDOGREL BISULFATE 75 MG PO TABS: 75.0000 mg | ORAL_TABLET | Freq: Every day | ORAL | 3 refills | Status: DC

## 2015-12-14 MED ORDER — METOPROLOL SUCCINATE ER 25 MG PO TB24: 25.0000 mg | ORAL_TABLET | Freq: Every day | ORAL | 3 refills | Status: DC

## 2015-12-14 MED ORDER — NITROGLYCERIN 0.2 MG/HR TD PT24: 0.2000 mg | MEDICATED_PATCH | Freq: Every day | TRANSDERMAL | 3 refills | Status: DC

## 2015-12-14 MED FILL — NITROGLYCERIN 0.2 MG/HR PAT: 0.2 | 30 days supply | Qty: 30 | Fill #0

## 2015-12-14 MED FILL — METOPROLOL SUCC ER 25 MG TA: 25 | 30 days supply | Qty: 30 | Fill #0

## 2015-12-14 MED FILL — CLOPIDOGREL 75 MG TABLET: 75 | 30 days supply | Qty: 30 | Fill #0

## 2015-12-14 NOTE — Addendum Note (Signed)
Addended by: Loa SocksMARTIN, Sheng Pritz M on: 12/14/2015 11:44 AM   Modules accepted: Orders

## 2015-12-14 NOTE — Progress Notes (Addendum)
12/14/2015 Brett Salinas   09/16/1970  [id number]  Primary Physician Jacklynn BarnaclePLACEY,MARY H, NP Primary Cardiologist: Dr. Delton SeeNelson    Reason for Visit/CC: Post ER follow up.  HPI:  [name] recently established are with our practice during recent hospitalization. In summary, he is a 45 year old schizophrenic, bipolar AA male who was recently released from prison. He was living in a hotel when a porcelain sink fell on his right heel, severing his Rt achilles tendon. He underwent I&D and tendon repair 09/19/15. He was discharged 10/01/15. Post op he did not fill his Xarelto Rx but took ASA. He presented to the ED 10/12/15 with exertional chest pain and SOB that had been going on for two days. His drug screen was positive for cocaine. His Troponin came back positive- pk 0.18. Cath done 10/13/15 revealed a 90% proximal LAD and no other significant CAD. He underwent successful PCI with DES. Echo revealed normal left ventricular EF at 60-65%. Mild MR was also noted. He was placed on dual antiplatelet therapy with aspirin and Brilinta as well as carvedilol and atorvastatin. I evaluated him for post hospital f/u on 10/29/15. He was doing well from a cardiac standpoint.  12/14/2015 - this is a 2 week follow-up, the patient was seen by [name] 2 weeks ago where he admitted he is not taking his medications. Today he states again that he doesn't believe in medications and hasn't taken any of them. He is coming accompanied by an orthotist priest who he describes as his family. He states that he hasn't been experiencing any chest pain or shortness of breath or palpitations. However he states that on 12/07/2015 he experienced a syncopal episode while in the bathroom. He denies any chest pain or palpitations prior to her episode. He was taken to the ER for ACS was ruled out. He states that he has been disconnected to his family he has multiple children and grandchildren. He reinforces that he doesn't believe that his  body needs medicines and also states that those medicines are costing his church a lot of money.     No outpatient prescriptions have been marked as taking for the 12/14/15 encounter (Office Visit) with [name], MD.   Allergies  Allergen Reactions  . 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 Heparin Itching    Full-body itching inc throat  . Pork-Derived Products Itching    Burning/itching   Past Medical History:  Diagnosis Date  . Arthritis    "hips, knees, back" (10/13/2015)  . Bipolar disorder (HCC)   . CHF (congestive heart failure) (HCC)   . Coronary artery disease   . Depression   . DVT (deep venous thrombosis) (HCC) 09/2015   right  . Heart murmur   . High cholesterol   . Hypertension   . Myocardial infarction 10/12/2015 X 2-3  . Rectal myiasis   . Schizophrenia (HCC)   . Sciatica    No family history on file. Past Surgical History:  Procedure Laterality Date  . CARDIAC CATHETERIZATION N/A 10/13/2015   Procedure: Left Heart Cath and Coronary Angiography;  Surgeon: Corky CraftsJayadeep S Varanasi, MD;  [facility];  Service: Cardiovascular;  Laterality: N/A;  . CARDIAC CATHETERIZATION N/A 10/13/2015   Procedure: Coronary Stent Intervention;  Surgeon: Corky CraftsJayadeep S Varanasi, MD;  [facility];  Service: Cardiovascular;  Laterality: N/A;  . CARDIAC CATHETERIZATION N/A 10/13/2015   Procedure: Intravascular Ultrasound/IVUS;  Surgeon: Corky CraftsJayadeep S Varanasi, MD;  [facility];  Service:  Cardiovascular;  Laterality: N/A;  . CORONARY ANGIOPLASTY WITH STENT PLACEMENT  10/13/2015  . I&D EXTREMITY Right 09/19/2015   Procedure: IRRIGATION AND DEBRIDEMENT ANKLE LACERATIONS POSSIBLE TENDON REPAIR;  Surgeon: Cammy CopaScott Gregory Dean, MD;  [facility];  Service: Orthopedics;  Laterality: Right;  . I&D EXTREMITY Right 10/14/2015   Procedure: IRRIGATION AND DEBRIDEMENT EXTREMITY/Right Ankle;  Surgeon: Cammy CopaScott Gregory Dean, MD;  [facility];  Service:  Orthopedics;  Laterality: Right;   Social History   Social History  . Marital status: Single    Spouse name: N/A  . Number of children: N/A  . Years of education: N/A   Occupational History  . Not on file.   Social History Main Topics  . Smoking status: Never Smoker  . Smokeless tobacco: Never Used  . Alcohol use Yes     Comment: 10/13/2015 "might have a drink q blue moon"  . Drug use:      Comment: 10/13/2015 "I've tried alot; I don't have any habits"  . Sexual activity: Not on file   Other Topics Concern  . Not on file   Social History Narrative  . No narrative on file     Review of Systems: General: negative for chills, fever, night sweats or weight changes.  Cardiovascular: negative for chest pain, dyspnea on exertion, edema, orthopnea, palpitations, paroxysmal nocturnal dyspnea or shortness of breath Dermatological: negative for rash Respiratory: negative for cough or wheezing Urologic: negative for hematuria Abdominal: negative for nausea, vomiting, diarrhea, bright red blood per rectum, melena, or hematemesis Neurologic: negative for visual changes, syncope, or dizziness All other systems reviewed and are otherwise negative except as noted above.   Physical Exam:  Blood pressure (!) 150/90, pulse 68, height 6\' 7"  (2.007 m), weight 220 lb (99.8 kg).  General appearance: alert, cooperative, no distress Neck: no carotid bruit and no JVD Lungs: clear to auscultation bilaterally Heart: regular rhythm, tachy rate, S1, S2 normal, no murmur, click, rub or gallop Extremities: no LEE Pulses: 2+ and symmetric Skin: warm and dry Neurologic:  Grossly normal  EKG: Performed in the ER on 12/07/2015 shows sinus rhythm with ventricular rate 96 bpm, repolarization abnormalities in anterior leads.   ASSESSMENT AND PLAN:   1. CAD: s/p  NSTEMI with 90% LAD stenosis, S/p PCI + DES on 10/2015.  The patient hasn't been taking any medicines as he has strong beliefs that his body  doesn't need them, after spending a lot of time discussing with him and his priest that he possibly needs to take them keep his stent open I was able to persuade him to take at least aspirin, I'm switching Brilinta to Plavix as there is financial concern, I will also switch carvedilol to Toprol-XL 25 mg daily as he is only willing to take medicines once a day. He states that he understands risky staking when he is not taking his medicines and his willing to take responsibility for it.  2. Achilles Tendon Tear: traumatic injury. S/p surgery/ I&D. Now bearing full weight. No longer on crutches.   3. HTN: Repeated blood pressure 145/88, he was not taking any medicines he'll be started on Toprol-XL 25 mg daily.  4. HLD: He is not willing to take any statins as they're causing him muscle ache and his concern about price.  5. Status post syncope while straining in the bathroom possibly vasovagal.  PLAN  Keep f/u with Dr. Delton SeeNelson in 3 months.   Tobias AlexanderKatarina Kinslea Frances , MD 12/14/2015 9:52 AM

## 2015-12-14 NOTE — Patient Instructions (Addendum)
Medication Instructions:   STOP TAKING BRILINTA  STOP TAKING CARVEDILOL  START BACK TAKING ASPIRIN 81 MG ONCE DAILY  START TAKING PLAVIX 75 MG ONCE DAILY  START TAKING TOPROL XL (METOPROLOL SUCCINATE) 25 MG ONCE DAILY    Follow-Up:  3 MONTHS WITH DR Delton SeeNELSON      If you need a refill on your cardiac medications before your next appointment, please call your pharmacy.

## 2015-12-14 NOTE — Congregational Nurse Program (Signed)
Congregational Nurse Program Note  Date of Encounter: 12/11/2015  Past Medical History: Past Medical History:  Diagnosis Date  . Arthritis    "hips, knees, back" (10/13/2015)  . Bipolar disorder (HCC)   . CHF (congestive heart failure) (HCC)   . Coronary artery disease   . Depression   . DVT (deep venous thrombosis) (HCC) 09/2015   right  . Heart murmur   . High cholesterol   . Hypertension   . Myocardial infarction 10/12/2015 X 2-3  . Rectal myiasis   . Schizophrenia (HCC)   . Sciatica     Encounter Details:     CNP Questionnaire - 12/11/15 1030      Patient Demographics   Is this a new or existing patient? New   Patient is considered a/an Not Applicable   Race African-American/Black     Patient Assistance   Location of Patient Assistance [facility]   Patient's financial/insurance status Self-Pay (Uninsured)   Uninsured Patient ([employer] officer, trade unionCard/Care Connects) Yes   Interventions Not Applicable   Patient referred to apply for the following financial assistance Not Applicable   Food insecurities addressed Not Applicable   Transportation assistance Yes   Type of Assistance Bus Pass Given   Assistance securing medications No   Educational health offerings Not Applicable     Encounter Details   Primary purpose of visit Navigating the Healthcare System   Was an Emergency Department visit averted? Not Applicable   Patient referred to Follow up with established PCP   Was a mental health screening completed? (GAINS tool) No   Does patient have dental issues? No   Does patient have vision issues? No   Does your patient have an abnormal blood pressure today? No   Since previous encounter, have you referred patient for abnormal blood pressure that resulted in a new diagnosis or medication change? No   Does your patient have an abnormal blood glucose today? No   Since previous encounter, have you referred patient for abnormal blood glucose that resulted in a new diagnosis or medication  change? No   Was there a life-saving intervention made? No     Client has multiple appointments on 11/13. Reviewed appointments and location. Bus passes given. Client advised to bring appointment paperwork on completion.

## 2015-12-14 NOTE — Progress Notes (Signed)
Wound Care Note   Patient: Brett BinetDerrick Salinas           Date of Birth: 11/14/1970           MRN: [id number]             PCP: Jacklynn BarnaclePLACEY,MARY H, NP Visit Date: 12/14/2015   Assessment & Plan: Visit Diagnoses:  1. Achilles tendinitis, right leg     Plan: Insertional Achilles tendinitis status post open traumatic rupture. We'll give him a 916 since heel lift to help unload the tendon he is tender to palpation at the insertion. We will start him on nitroglycerin patch and the proper use was discussed. Recommend the get an over-the-counter medical compression sock to wear at all times follow-up in 2 months. Discussed that if he is not showing improvement we would need to take down the fibrous nonunion and plan for revision internal fixation of the Achilles. Discussed that he is at increased risk of infection with the previous open laceration and his history of previous infections in his foot.   Follow-Up Instructions: Return in about 2 months (around 02/13/2016).  Orders:  No orders of the defined types were placed in this encounter.  Meds ordered this encounter  Medications  . nitroGLYCERIN (NITRODUR - DOSED IN MG/24 HR) 0.2 mg/hr patch    Sig: Place 1 patch (0.2 mg total) onto the skin daily.    Dispense:  30 patch    Refill:  3    Apply patch near the effected area, change location daily      Procedures: No notes on file   Clinical Data: No additional findings.   No images are attached to the encounter.   Subjective: Chief Complaint  Patient presents with  . Right Ankle - Pain    Achilles tendon repair 09/2015.    Patient presents today for medication management. He is s/p irrigation and debridement right ankle laceration with achilles tendon repair 09/19/15. He is 3 months post op. He is wanting to know the regulation of why he cannot have pain medication. He was given tramadol 3 weeks ago by Denny PeonErin NP, but has not been given any narcotics since September. He is wanting to know  why. He is full weightbearing and feels his wound is showing good improvement but the problem today is his pain. He has one additional appointment with Dr. Leanord Hawkingobson for wound debridement. He wants a referral to pain management group if he is not going to be able to get pain medication from our office.     Review of Systems    Objective: Vital Signs: There were no vitals taken for this visit.  Physical Exam: On examination patient is alert oriented nonoperative well-dressed normal affect the worst wear for he has an antalgic gait. He has good pulses there is no cellulitis the traumatic wound is well-healed this is posterior medially. With compression the calf his flexion of the foot he has pain to palpation at the insertion consistent with some fibrous healing at the open traumatic wound.  Specialty Comments: No specialty comments available.   PMFS History: Patient Active Problem List   Diagnosis Date Noted  . Essential hypertension 11/30/2015  . CAD -S/P PCI LAD/DES 10/13/15 10/14/2015  . Wound infection- for I&D achilles wound 10/14/15 10/14/2015  . Cocaine abuse-drug sceen positive 10/14/2015  . Schizoaffective disorder, bipolar type (HCC) 10/14/2015  . Infection 10/14/2015  . NSTEMI (non-ST elevated myocardial infarction) (HCC)   . Chest pain 10/12/2015  . Achilles tendon  rupture-surgery 09/19/15 09/19/2015   Past Medical History:  Diagnosis Date  . Arthritis    "hips, knees, back" (10/13/2015)  . Bipolar disorder (HCC)   . CHF (congestive heart failure) (HCC)   . Coronary artery disease   . Depression   . DVT (deep venous thrombosis) (HCC) 09/2015   right  . Heart murmur   . High cholesterol   . Hypertension   . Myocardial infarction 10/12/2015 X 2-3  . Rectal myiasis   . Schizophrenia (HCC)   . Sciatica     History reviewed. No pertinent family history. Past Surgical History:  Procedure Laterality Date  . CARDIAC CATHETERIZATION N/A 10/13/2015   Procedure: Left Heart  Cath and Coronary Angiography;  Surgeon: Corky CraftsJayadeep S Varanasi, MD;  [facility];  Service: Cardiovascular;  Laterality: N/A;  . CARDIAC CATHETERIZATION N/A 10/13/2015   Procedure: Coronary Stent Intervention;  Surgeon: Corky CraftsJayadeep S Varanasi, MD;  [facility];  Service: Cardiovascular;  Laterality: N/A;  . CARDIAC CATHETERIZATION N/A 10/13/2015   Procedure: Intravascular Ultrasound/IVUS;  Surgeon: Corky CraftsJayadeep S Varanasi, MD;  [facility];  Service: Cardiovascular;  Laterality: N/A;  . CORONARY ANGIOPLASTY WITH STENT PLACEMENT  10/13/2015  . I&D EXTREMITY Right 09/19/2015   Procedure: IRRIGATION AND DEBRIDEMENT ANKLE LACERATIONS POSSIBLE TENDON REPAIR;  Surgeon: Cammy CopaScott Gregory Dean, MD;  Location: [facility] OR;  Service: Orthopedics;  Laterality: Right;  . I&D EXTREMITY Right 10/14/2015   Procedure: IRRIGATION AND DEBRIDEMENT EXTREMITY/Right Ankle;  Surgeon: Cammy CopaScott Gregory Dean, MD;  Location: [facility] OR;  Service: Orthopedics;  Laterality: Right;   Social History   Occupational History  . Not on file.   Social History Main Topics  . Smoking status: Never Smoker  . Smokeless tobacco: Never Used  . Alcohol use Yes     Comment: 10/13/2015 "might have a drink q blue moon"  . Drug use:      Comment: 10/13/2015 "I've tried alot; I don't have any habits"  . Sexual activity: Not on file

## 2015-12-15 ENCOUNTER — Encounter: Payer: Self-pay | Admitting: Pediatric Intensive Care

## 2015-12-15 NOTE — Congregational Nurse Program (Signed)
Congregational Nurse Program Note  Date of Encounter: 12/15/2015  Past Medical History: Past Medical History:  Diagnosis Date  . Arthritis    "hips, knees, back" (10/13/2015)  . Bipolar disorder (HCC)   . CHF (congestive heart failure) (HCC)   . Coronary artery disease   . Depression   . DVT (deep venous thrombosis) (HCC) 09/2015   right  . Heart murmur   . High cholesterol   . Hypertension   . Myocardial infarction 10/12/2015 X 2-3  . Rectal myiasis   . Schizophrenia (HCC)   . Sciatica     Encounter Details:     CNP Questionnaire - 12/15/15 0900      Patient Demographics   Is this a new or existing patient? Existing   Patient is considered a/an Not Applicable   Race African-American/Black     Patient Assistance   Location of Patient Assistance [facility]   Patient's financial/insurance status Self-Pay (Uninsured)   Uninsured Patient ([employer] officer, trade unionCard/Care Connects) Yes   Interventions Appt. has been completed   Patient referred to apply for the following financial assistance Not Applicable   Food insecurities addressed Not Applicable   Transportation assistance No   Type of Assistance Other   Assistance securing medications No   Educational health offerings Not Applicable     Encounter Details   Primary purpose of visit Navigating the Healthcare System   Was an Emergency Department visit averted? Not Applicable   Does patient have a medical provider? Yes   Patient referred to Follow up with established PCP   Was a mental health screening completed? (GAINS tool) No   Does patient have dental issues? No   Does patient have vision issues? No   Does your patient have an abnormal blood pressure today? No   Since previous encounter, have you referred patient for abnormal blood pressure that resulted in a new diagnosis or medication change? No   Does your patient have an abnormal blood glucose today? No   Since previous encounter, have you referred patient for abnormal blood  glucose that resulted in a new diagnosis or medication change? No   Was there a life-saving intervention made? No     Client in to review completed appointments. States that he needs pain management appointment. States that he needs CN to call [facility] to check on [employer] status. CN called [facility]. Stated that it will take up to 3 weeks to process [facility] will call client. CN communicated this to client.

## 2015-12-21 ENCOUNTER — Telehealth (INDEPENDENT_AMBULATORY_CARE_PROVIDER_SITE_OTHER): Payer: Self-pay | Admitting: Orthopedic Surgery

## 2015-12-21 ENCOUNTER — Encounter: Payer: Self-pay | Admitting: Pediatric Intensive Care

## 2015-12-21 NOTE — Telephone Encounter (Signed)
Duda patient °

## 2015-12-24 ENCOUNTER — Encounter (INDEPENDENT_AMBULATORY_CARE_PROVIDER_SITE_OTHER): Payer: Self-pay | Admitting: Orthopedic Surgery

## 2015-12-24 ENCOUNTER — Encounter: Payer: Self-pay | Admitting: Cardiology

## 2015-12-24 ENCOUNTER — Encounter (INDEPENDENT_AMBULATORY_CARE_PROVIDER_SITE_OTHER): Payer: Self-pay | Admitting: Family

## 2015-12-25 NOTE — Congregational Nurse Program (Signed)
Congregational Nurse Program Note  Date of Encounter: 12/21/2015  Past Medical History: Past Medical History:  Diagnosis Date  . Arthritis    "hips, knees, back" (10/13/2015)  . Bipolar disorder (HCC)   . CHF (congestive heart failure) (HCC)   . Coronary artery disease   . Depression   . DVT (deep venous thrombosis) (HCC) 09/2015   right  . Heart murmur   . High cholesterol   . Hypertension   . Myocardial infarction 10/12/2015 X 2-3  . Rectal myiasis   . Schizophrenia (HCC)   . Sciatica     Encounter Details:     CNP Questionnaire - 12/21/15 1143      Patient Demographics   Is this a new or existing patient? New   Patient is considered a/an Not Applicable   Race African-American/Black     Patient Assistance   Location of Patient Assistance [facility]   Patient's financial/insurance status Self-Pay (Uninsured)   Uninsured Patient ([employer] officer, trade unionCard/Care Connects) Yes   Interventions Appt. has been completed   Patient referred to apply for the following financial assistance Not Applicable   Food insecurities addressed Not Applicable   Transportation assistance No   Type of Assistance Other   Assistance securing medications No   Educational Designer, television/film sethealth offerings Behavioral health     Encounter Details   Primary purpose of visit Spiritual Care/Support Visit   Was an Emergency Department visit averted? Not Applicable   Does patient have a medical provider? Yes   Patient referred to Not Applicable   Was a mental health screening completed? (GAINS tool) No   Does patient have dental issues? No   Does patient have vision issues? No   Does your patient have an abnormal blood pressure today? No   Since previous encounter, have you referred patient for abnormal blood pressure that resulted in a new diagnosis or medication change? No   Does your patient have an abnormal blood glucose today? No   Since previous encounter, have you referred patient for abnormal blood glucose that resulted in a  new diagnosis or medication change? No   Was there a life-saving intervention made? No      Client introduced self stating I was a part of his "safety plan" in case he was needing to talk.  Encouraged client to check in with me on a regular basis

## 2015-12-29 ENCOUNTER — Encounter (INDEPENDENT_AMBULATORY_CARE_PROVIDER_SITE_OTHER): Payer: Self-pay | Admitting: Family

## 2015-12-30 ENCOUNTER — Ambulatory Visit: Payer: Self-pay | Attending: Internal Medicine | Admitting: Internal Medicine

## 2015-12-30 ENCOUNTER — Encounter: Payer: Self-pay | Admitting: Internal Medicine

## 2015-12-30 VITALS — BP 130/74 | HR 92 | Temp 98.1°F | Resp 16 | Wt 221.0 lb

## 2015-12-30 DIAGNOSIS — S86011D Strain of right Achilles tendon, subsequent encounter: Secondary | ICD-10-CM | POA: Insufficient documentation

## 2015-12-30 DIAGNOSIS — H6122 Impacted cerumen, left ear: Secondary | ICD-10-CM

## 2015-12-30 DIAGNOSIS — F250 Schizoaffective disorder, bipolar type: Secondary | ICD-10-CM | POA: Insufficient documentation

## 2015-12-30 DIAGNOSIS — Z7902 Long term (current) use of antithrombotics/antiplatelets: Secondary | ICD-10-CM | POA: Insufficient documentation

## 2015-12-30 DIAGNOSIS — Z9861 Coronary angioplasty status: Secondary | ICD-10-CM

## 2015-12-30 DIAGNOSIS — Z888 Allergy status to other drugs, medicaments and biological substances status: Secondary | ICD-10-CM | POA: Insufficient documentation

## 2015-12-30 DIAGNOSIS — I214 Non-ST elevation (NSTEMI) myocardial infarction: Secondary | ICD-10-CM

## 2015-12-30 DIAGNOSIS — I509 Heart failure, unspecified: Secondary | ICD-10-CM | POA: Insufficient documentation

## 2015-12-30 DIAGNOSIS — Z590 Homelessness: Secondary | ICD-10-CM | POA: Insufficient documentation

## 2015-12-30 DIAGNOSIS — I2510 Atherosclerotic heart disease of native coronary artery without angina pectoris: Secondary | ICD-10-CM | POA: Insufficient documentation

## 2015-12-30 DIAGNOSIS — Z79899 Other long term (current) drug therapy: Secondary | ICD-10-CM | POA: Insufficient documentation

## 2015-12-30 DIAGNOSIS — G43909 Migraine, unspecified, not intractable, without status migrainosus: Secondary | ICD-10-CM | POA: Insufficient documentation

## 2015-12-30 DIAGNOSIS — Z7982 Long term (current) use of aspirin: Secondary | ICD-10-CM | POA: Insufficient documentation

## 2015-12-30 DIAGNOSIS — I252 Old myocardial infarction: Secondary | ICD-10-CM | POA: Insufficient documentation

## 2015-12-30 DIAGNOSIS — I110 Hypertensive heart disease with heart failure: Secondary | ICD-10-CM | POA: Insufficient documentation

## 2015-12-30 DIAGNOSIS — X58XXXD Exposure to other specified factors, subsequent encounter: Secondary | ICD-10-CM | POA: Insufficient documentation

## 2015-12-30 MED ORDER — METOPROLOL SUCCINATE ER 25 MG PO TB24: 12.5000 mg | ORAL_TABLET | Freq: Every day | ORAL | 3 refills | Status: DC

## 2015-12-30 NOTE — Progress Notes (Signed)
Pt is in the office today for establish care and referral Pt states his pain level today in the office is a 9 Pt states his pain is coming from his right foot, ankle and leg Pt states he had surgery on August 18th on his right foot

## 2015-12-30 NOTE — Patient Instructions (Signed)
- RN bp check 1 wk.  -   Low-Sodium Eating Plan Sodium raises blood pressure and causes water to be held in the body. Getting less sodium from food will help lower your blood pressure, reduce any swelling, and protect your heart, liver, and kidneys. We get sodium by adding salt (sodium chloride) to food. Most of our sodium comes from canned, boxed, and frozen foods. Restaurant foods, fast foods, and pizza are also very high in sodium. Even if you take medicine to lower your blood pressure or to reduce fluid in your body, getting less sodium from your food is important. What is my plan? Most people should limit their sodium intake to 2,300 mg a day. Your health care provider recommends that you limit your sodium intake to 2,000 mg a day. What do I need to know about this eating plan? For the low-sodium eating plan, you will follow these general guidelines:  Choose foods with a % Daily Value for sodium of less than 5% (as listed on the food label).  Use salt-free seasonings or herbs instead of table salt or sea salt.  Check with your health care provider or pharmacist before using salt substitutes.  Eat fresh foods.  Eat more vegetables and fruits.  Limit canned vegetables. If you do use them, rinse them well to decrease the sodium.  Limit cheese to 1 oz (28 g) per day.  Eat lower-sodium products, often labeled as "lower sodium" or "no salt added."  Avoid foods that contain monosodium glutamate (MSG). MSG is sometimes added to [place] food and some canned foods.  Check food labels (Nutrition Facts labels) on foods to learn how much sodium is in one serving.  Eat more home-cooked food and less restaurant, buffet, and fast food.  When eating at a restaurant, ask that your food be prepared with less salt, or no salt if possible. How do I read food labels for sodium information? The Nutrition Facts label lists the amount of sodium in one serving of the food. If you eat more than one  serving, you must multiply the listed amount of sodium by the number of servings. Food labels may also identify foods as:  Sodium free-Less than 5 mg in a serving.  Very low sodium-35 mg or less in a serving.  Low sodium-140 mg or less in a serving.  Light in sodium-50% less sodium in a serving. For example, if a food that usually has 300 mg of sodium is changed to become light in sodium, it will have 150 mg of sodium.  Reduced sodium-25% less sodium in a serving. For example, if a food that usually has 400 mg of sodium is changed to reduced sodium, it will have 300 mg of sodium. What foods can I eat? Grains  Low-sodium cereals, including oats, puffed wheat and rice, and shredded wheat cereals. Low-sodium crackers. Unsalted rice and pasta. Lower-sodium bread. Vegetables  Frozen or fresh vegetables. Low-sodium or reduced-sodium canned vegetables. Low-sodium or reduced-sodium tomato sauce and paste. Low-sodium or reduced-sodium tomato and vegetable juices. Fruits  Fresh, frozen, and canned fruit. Fruit juice. Meat and Other Protein Products  Low-sodium canned tuna and salmon. Fresh or frozen meat, poultry, seafood, and fish. Lamb. Unsalted nuts. Dried beans, peas, and lentils without added salt. Unsalted canned beans. Homemade soups without salt. Eggs. Dairy  Milk. Soy milk. Ricotta cheese. Low-sodium or reduced-sodium cheeses. Yogurt. Condiments  Fresh and dried herbs and spices. Salt-free seasonings. Onion and garlic powders. Low-sodium varieties of mustard and ketchup.  Fresh or refrigerated horseradish. Lemon juice. Fats and Oils  Reduced-sodium salad dressings. Unsalted butter. Other  Unsalted popcorn and pretzels. The items listed above may not be a complete list of recommended foods or beverages. Contact your dietitian for more options.  What foods are not recommended? Grains  Instant hot cereals. Bread stuffing, pancake, and biscuit mixes. Croutons. Seasoned rice or pasta mixes.  Noodle soup cups. Boxed or frozen macaroni and cheese. Self-rising flour. Regular salted crackers. Vegetables  Regular canned vegetables. Regular canned tomato sauce and paste. Regular tomato and vegetable juices. Frozen vegetables in sauces. Salted [place] fries. Olives. Rosita FirePickles. Relishes. Sauerkraut. Salsa. Meat and Other Protein Products  Salted, canned, smoked, spiced, or pickled meats, seafood, or fish. Bacon, ham, sausage, hot dogs, corned beef, chipped beef, and packaged luncheon meats. Salt pork. Jerky. Pickled herring. Anchovies, regular canned tuna, and sardines. Salted nuts. Dairy  Processed cheese and cheese spreads. Cheese curds. Blue cheese and cottage cheese. Buttermilk. Condiments  Onion and garlic salt, seasoned salt, table salt, and sea salt. Canned and packaged gravies. Worcestershire sauce. Tartar sauce. Barbecue sauce. Teriyaki sauce. Soy sauce, including reduced sodium. Steak sauce. Fish sauce. Oyster sauce. Cocktail sauce. Horseradish that you find on the shelf. Regular ketchup and mustard. Meat flavorings and tenderizers. Bouillon cubes. Hot sauce. Tabasco sauce. Marinades. Taco seasonings. Relishes. Fats and Oils  Regular salad dressings. Salted butter. Margarine. Ghee. Bacon fat. Other  Potato and tortilla chips. Corn chips and puffs. Salted popcorn and pretzels. Canned or dried soups. Pizza. Frozen entrees and pot pies. The items listed above may not be a complete list of foods and beverages to avoid. Contact your dietitian for more information.  This information is not intended to replace advice given to you by your health care provider. Make sure you discuss any questions you have with your health care provider. Document Released: 07/09/2001 Document Revised: 06/25/2015 Document Reviewed: 11/21/2012 Elsevier Interactive Patient Education  2017 Elsevier Inc.  -  Acute Coronary Syndrome Acute coronary syndrome (ACS) is a serious problem in which there is suddenly not  enough blood and oxygen supplied to the heart. ACS may mean that one or more of the blood vessels in your heart (coronary arteries) may be blocked. ACS can result in chest pain or a heart attack (myocardial infarction or MI). What are the causes? This condition is caused by atherosclerosis, which is the buildup of fat and cholesterol (plaque) on the inside of the arteries. Over time, the plaque may narrow or block the artery, and this will lessen blood flow to the heart. Plaque can also become weak and break off within a coronary artery to form a clot and cause a sudden blockage. What increases the risk? The risk factors of this condition include:  High cholesterol levels.  High blood pressure (hypertension).  Smoking.  Diabetes.  Age.  Family history of chest pain, heart disease, or stroke.  Lack of exercise. What are the signs or symptoms? The most common signs of this condition include:  Chest pain, which can be:  A crushing or squeezing in the chest.  A tightness, pressure, fullness, or heaviness in the chest.  Present for more than a few minutes, or it can stop and recur.  Pain in the arms, neck, jaw, or back.  Unexplained heartburn or indigestion.  Shortness of breath.  Nausea.  Sudden cold sweats.  Feeling light-headed or dizzy. Sometimes, this condition has no symptoms. How is this diagnosed? ACS may be diagnosed through the following tests:  Electrocardiogram (ECG).  Blood tests.  Coronary angiogram. This is a procedure to look at the coronary arteries to see if there is any blockage. How is this treated? Treatment for ACS may include:  Healthy behavioral changes to reduce or control risk factors.  Medicine.  Coronary stenting.A stent helps to keep an artery open.  Coronary angioplasty. This procedure widens a narrowed or blocked artery.  Coronary artery bypass surgery. This will allow your blood to pass the blockage (bypass) to reach your  heart. Follow these instructions at home: Eating and drinking  Follow a heart-healthy diet. A dietitian can you help to educate you about healthy food options and changes.  Use healthy cooking methods such as roasting, grilling, broiling, baking, poaching, steaming, or stir-frying. Talk to a dietitian to learn more about healthy cooking methods. Medicines  Take medicines only as directed by your health care provider.  Do not take the following medicines unless your health care provider approves:  Nonsteroidal anti-inflammatory drugs (NSAIDs), such as ibuprofen, naproxen, or celecoxib.  Vitamin supplements that contain vitamin A, vitamin E, or both.  Hormone replacement therapy that contains estrogen with or without progestin.  Stop illegal drug use. Activity  Follow an exercise program that is approved by your health care provider.  Plan rest periods when you are fatigued. Lifestyle  Do not use any tobacco products, including cigarettes, chewing tobacco, or electronic cigarettes. If you need help quitting, ask your health care provider.  If you drink alcohol, and your health care provider approves, limit your alcohol intake to no more than 1 drink per day. One drink equals 12 ounces of beer, 5 ounces of wine, or 1 ounces of hard liquor.  Learn to manage stress.  Maintain a healthy weight. Lose weight as approved by your health care provider. General instructions  Manage other health conditions, such as hypertension and diabetes, as directed by your health care provider.  Keep all follow-up visits as directed by your health care provider. This is important.  Your health care provider may ask you to monitor your blood pressure. A blood pressure reading consists of a higher number over a lower number, such as 110 over 72, written as 110/72. Ideally, your blood pressure should be:  Below 140/90 if you have no other medical conditions.  Below 130/80 if you have diabetes or  kidney disease. Get help right away if:  You have pain in your chest, neck, arm, jaw, stomach, or back that lasts more than a few minutes, is recurring, or is not relieved by taking medicine under your tongue (sublingual nitroglycerin).  You have profuse sweating without cause.  You have unexplained:  Heartburn or indigestion.  Shortness of breath or difficulty breathing.  Nausea or vomiting.  Fatigue.  Feelings of nervousness or anxiety.  Weakness.  Diarrhea.  You have sudden light-headedness or dizziness.  You faint. These symptoms may represent a serious problem that is an emergency. Do not wait to see if the symptoms will go away. Get medical help right away. Call your local emergency services (911 in the [place] Do not drive yourself to the clinic or hospital.  This information is not intended to replace advice given to you by your health care provider. Make sure you discuss any questions you have with your health care provider. Document Released: 01/17/2005 Document Revised: 07/01/2015 Document Reviewed: 05/21/2013 Elsevier Interactive Patient Education  2017 Elsevier Inc.   -  Hypertension Hypertension is another name for high blood pressure. High blood pressure forces  your heart to work harder to pump blood. A blood pressure reading has two numbers, which includes a higher number over a lower number (example: 110/72). Follow these instructions at home:  Have your blood pressure rechecked by your doctor.  Only take medicine as told by your doctor. Follow the directions carefully. The medicine does not work as well if you skip doses. Skipping doses also puts you at risk for problems.  Do not smoke.  Monitor your blood pressure at home as told by your doctor. Contact a doctor if:  You think you are having a reaction to the medicine you are taking.  You have repeat headaches or feel dizzy.  You have puffiness (swelling) in your ankles.  You have trouble with  your vision. Get help right away if:  You get a very bad headache and are confused.  You feel weak, numb, or faint.  You get chest or belly (abdominal) pain.  You throw up (vomit).  You cannot breathe very well. This information is not intended to replace advice given to you by your health care provider. Make sure you discuss any questions you have with your health care provider. Document Released: 07/06/2007 Document Revised: 06/25/2015 Document Reviewed: 11/09/2012 Elsevier Interactive Patient Education  2017 [employer]

## 2015-12-30 NOTE — Progress Notes (Addendum)
Brett BinetDerrick Salinas, is a 45 y.o. male  ZOX:[id number]:[id number]  WJX:[id number]  DOB - 05/18/1970  CC:  Chief Complaint  Patient presents with  . Establish Care       HPI: Brett Salinas is a 45 y.o. AA  male here today to establish medical care., last seen 9/18 in clinic w/ PA., w/ significant hx of schizophrenia, bipolar, cad, htn, right achilles tendonitis  Pt w/ hx of htn, cad, nstemi 10/12/15 w/ + troponins.   Cath done 10/13/15 revealed a 90% proximal LAD and no other significant CAD. He underwent successful PCI with DES. Echo revealed normal left ventricular EF at 60-65%. Mild MR was also noted. He wasplaced on dual antiplatelet therapy with aspirin and Brilinta as well as carvedilol and atorvastatin.  Pt recently saw cards 12/14/15, and noted that he was not taking any of his medications prescribed, b/c did not believe in taking medications.  Of note, pt last took metoprolol and plavix about 1 week ago, and states he is trying to take the aspirin daily.  Of note, pt also has hx of trauma to right heel, severing his Rt achilles tendon. He underwent I&D and tendon repair 09/19/15. He was discharged 10/01/15. Of note, still c/o of pain, wants to see PM&R. States the orthopedics are considering redo surgery, but he wants to hold off on that option if able.  He denies smoking, drinks etoh occasionally. Homeless, jobless, currently living in [facility].  Pt tells me that he does not believe in pills, worried about all the side effects. Attributes all his problems to taking pills prescribed to him.  He is here today w/ his Brett Salinas.   Patient has No headache, No chest pain, No abdominal pain - No Nausea, No new weakness tingling or numbness, No Cough - SOB.      Review of Systems: Per hpi, o/w all systems reviewed and negative.   Allergies  Allergen Reactions  . 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 Heparin Itching    Full-body itching inc throat  . Pork-Derived Products Itching   Burning/itching   Past Medical History:  Diagnosis Date  . Arthritis    "hips, knees, back" (10/13/2015)  . Bipolar disorder (HCC)   . CHF (congestive heart failure) (HCC)   . Coronary artery disease   . Depression   . DVT (deep venous thrombosis) (HCC) 09/2015   right  . Heart murmur   . High cholesterol   . Hypertension   . Myocardial infarction 10/12/2015 X 2-3  . Rectal myiasis   . Schizophrenia (HCC)   . Sciatica    Current Outpatient Prescriptions on File Prior to Visit  Medication Sig Dispense Refill  . aspirin EC 81 MG tablet Take 1 tablet (81 mg total) by mouth daily. 90 tablet 3  . clopidogrel (PLAVIX) 75 MG tablet Take 1 tablet (75 mg total) by mouth daily. 90 tablet 3  . nitroGLYCERIN (NITRODUR - DOSED IN MG/24 HR) 0.2 mg/hr patch Place 1 patch (0.2 mg total) onto the skin daily. 30 patch 3  . atorvastatin (LIPITOR) 40 MG tablet Take 1 tablet (40 mg total) by mouth daily at 6 PM. (Patient not taking: Reported on 12/14/2015) 30 tablet 2  . docusate sodium (COLACE) 100 MG capsule Take 1 capsule (100 mg total) by mouth 2 (two) times daily. (Patient not taking: Reported on 12/14/2015) 10 capsule 0  . doxycycline (VIBRA-TABS) 100 MG tablet Take 1 tablet (100 mg total) by mouth every 12 (twelve) hours. (Patient not  taking: Reported on 12/14/2015) 28 tablet 0  . methocarbamol (ROBAXIN) 500 MG tablet Take 1 tablet (500 mg total) by mouth every 6 (six) hours as needed for muscle spasms. (Patient not taking: Reported on 12/14/2015) 30 tablet 0  . nitroGLYCERIN (NITROSTAT) 0.4 MG SL tablet Place 1 tablet (0.4 mg total) under the tongue every 5 (five) minutes x 3 doses as needed for chest pain. (Patient not taking: Reported on 12/30/2015) 30 tablet 12  . oxyCODONE (OXY IR/ROXICODONE) 5 MG immediate release tablet Take 1-2 tablets (5-10 mg total) by mouth every 3 (three) hours as needed for breakthrough pain. (Patient not taking: Reported on 12/30/2015) 50 tablet 0  . traMADol (ULTRAM) 50  MG tablet Take by mouth 2 (two) times daily.     No current facility-administered medications on file prior to visit.    No family history on file. Social History   Social History  . Marital status: Single    Spouse name: N/A  . Number of children: N/A  . Years of education: N/A   Occupational History  . Not on file.   Social History Main Topics  . Smoking status: Never Smoker  . Smokeless tobacco: Never Used  . Alcohol use Yes     Comment: 10/13/2015 "might have a drink q blue moon"  . Drug use:      Comment: 10/13/2015 "I've tried alot; I don't have any habits"  . Sexual activity: Not on file   Other Topics Concern  . Not on file   Social History Narrative  . No narrative on file    Objective:   Vitals:   12/30/15 1002  BP: 130/74  Pulse: 92  Resp: 16  Temp: 98.1 F (36.7 C)    Filed Weights   12/30/15 1002  Weight: 221 lb (100.2 kg)    BP Readings from Last 3 Encounters:  12/30/15 130/74  12/14/15 (!) 150/90  12/08/15 122/89    Physical Exam: Constitutional: Patient appears well-developed and well-nourished. No distress. AAOx3, pleasant HENT: Normocephalic, atraumatic, External right and left ear normal. Oropharynx is clear and moist.  Left ear w/ ceruminosis impaction, right tm clear. Eyes: Conjunctivae and EOM are normal. PERRL, no scleral icterus. Neck: Normal ROM. Neck supple. No JVD.  CVS: RRR, S1/S2 +, no murmurs, no gallops, no carotid bruit.  Pulmonary: Effort and breath sounds normal, no stridor, rhonchi, wheezes, rales.  Abdominal: Soft. BS +, obese, no distension, tenderness, rebound or guarding.  Musculoskeletal: Normal range of motion. No edema and no tenderness.  LE: bilat/ no c/c/e, pulses 2+ bilateral. Neuro: Alert.  muscle tone coordination wnl. No cranial nerve deficit grossly. Skin: Skin is warm and dry. No rash noted. Not diaphoretic. No erythema. No pallor. Psychiatric: Normal mood and affect. Behavior, judgment, thought content  normal.  Lab Results  Component Value Date   WBC 7.4 12/07/2015   HGB 13.2 12/07/2015   HCT 39.8 12/07/2015   MCV 85.0 12/07/2015   PLT 222 12/07/2015   Lab Results  Component Value Date   CREATININE 1.23 12/07/2015   BUN 15 12/07/2015   NA 140 12/07/2015   K 3.9 12/07/2015   CL 107 12/07/2015   CO2 19 (L) 12/07/2015    Lab Results  Component Value Date   HGBA1C 5.6 10/12/2015   Lipid Panel     Component Value Date/Time   CHOL 155 10/13/2015 0533   TRIG 50 10/13/2015 0533   HDL 49 10/13/2015 0533   CHOLHDL 3.2 10/13/2015 0533  VLDL 10 10/13/2015 0533   LDLCALC 96 10/13/2015 0533        Depression screen PHQ 2/9 12/30/2015 10/20/2015  Decreased Interest 0 2  Down, Depressed, Hopeless 1 3  PHQ - 2 Score 1 5  Altered sleeping - 2  Tired, decreased energy - 1  Change in appetite - 2  Feeling bad or failure about yourself  - 3  Trouble concentrating - 1  Moving slowly or fidgety/restless - 0  Suicidal thoughts - 3  PHQ-9 Score - 17    Assessment and plan:   1. NSTEMI (non-ST elevated myocardial infarction) (HCC) - recd to take his asa and plavix regularly. - pt states that he had severe migraines on toprol xl 25. He brought in his bp readings at home and most sbp ranging 120 - upper 130s. Recd that pt tries taking toprol 12.5 mg (1/2 tab) to see if less headaches, cardioprotective. - recd statin as well, but pt not taking b/c does not believe it in. - advised him to take plavix regularly to prevent risk of stent restenosis.   2. CAD -S/P PCI LAD/DES 10/13/15 See above, per cards  3. Rupture of right Achilles tendon, subsequent encounter - defer to ortho - Ambulatory referral to Pain Clinic - for further eval on pain treatment.  4. Schizoaffective d/o, bipolar type - homeless, - psychosocial limitations. - now staying [facility], here w/ his Brett Salinas.  5. Ceruminosis, left ear Make rn visit for irrigation.  Return in about 3 months (around  03/30/2016).  The patient was given clear instructions to go to ER or return to medical center if symptoms don't improve, worsen or new problems develop. The patient verbalized understanding. The patient was told to call to get lab results if they haven't heard anything in the next week.    This note has been created with Education officer, environmentalDragon speech recognition software and smart phrase technology. Any transcriptional errors are unintentional.   Pete Glatterawn T Yaneisy Wenz, MD, MBA/MHA [facility] [place], [place] [phone]   12/30/2015, 10:30 AM

## 2016-01-01 ENCOUNTER — Ambulatory Visit: Payer: Self-pay

## 2016-01-05 ENCOUNTER — Ambulatory Visit: Payer: Self-pay | Admitting: Pharmacist

## 2016-01-06 ENCOUNTER — Ambulatory Visit (HOSPITAL_BASED_OUTPATIENT_CLINIC_OR_DEPARTMENT_OTHER): Payer: Self-pay | Admitting: *Deleted

## 2016-01-06 ENCOUNTER — Ambulatory Visit: Payer: Self-pay | Attending: Internal Medicine

## 2016-01-06 VITALS — BP 136/90 | HR 86 | Resp 20

## 2016-01-06 DIAGNOSIS — H6122 Impacted cerumen, left ear: Secondary | ICD-10-CM | POA: Insufficient documentation

## 2016-01-06 DIAGNOSIS — I214 Non-ST elevation (NSTEMI) myocardial infarction: Secondary | ICD-10-CM

## 2016-01-06 DIAGNOSIS — H6123 Impacted cerumen, bilateral: Secondary | ICD-10-CM

## 2016-01-06 NOTE — Progress Notes (Signed)
Pt is in the office today for a ear irrigation

## 2016-01-06 NOTE — Congregational Nurse Program (Signed)
Congregational Nurse Program Note  Date of Encounter: 12/07/2015  Past Medical History: Past Medical History:  Diagnosis Date  . Arthritis    "hips, knees, back" (10/13/2015)  . Bipolar disorder (HCC)   . CHF (congestive heart failure) (HCC)   . Coronary artery disease   . Depression   . DVT (deep venous thrombosis) (HCC) 09/2015   right  . Heart murmur   . High cholesterol   . Hypertension   . Myocardial infarction 10/12/2015 X 2-3  . Rectal myiasis   . Schizophrenia (HCC)   . Sciatica     Encounter Details:     CNP Questionnaire - 12/21/15 1143      Patient Demographics   Is this a new or existing patient? New   Patient is considered a/an Not Applicable   Race African-American/Black     Patient Assistance   Location of Patient Assistance [facility]   Patient's financial/insurance status Self-Pay (Uninsured)   Uninsured Patient ([employer] officer, trade unionCard/Care Connects) Yes   Interventions Appt. has been completed   Patient referred to apply for the following financial assistance Not Applicable   Food insecurities addressed Not Applicable   Transportation assistance No   Type of Assistance Other   Assistance securing medications No   Educational Designer, television/film sethealth offerings Behavioral health     Encounter Details   Primary purpose of visit Spiritual Care/Support Visit   Was an Emergency Department visit averted? Not Applicable   Does patient have a medical provider? Yes   Patient referred to Not Applicable   Was a mental health screening completed? (GAINS tool) No   Does patient have dental issues? No   Does patient have vision issues? No   Does your patient have an abnormal blood pressure today? No   Since previous encounter, have you referred patient for abnormal blood pressure that resulted in a new diagnosis or medication change? No   Does your patient have an abnormal blood glucose today? No   Since previous encounter, have you referred patient for abnormal blood glucose that resulted in a  new diagnosis or medication change? No   Was there a life-saving intervention made? No     Right ankle nearly healed. There is a small eschar on old scar. Client c/o nearly constant pain at site and on that foot making it difficult to work. Client wants CN to assist him in securing pain medication. CN states that she can only refer client to providers for evaluation. Client was agitated at appointment and angry that CN was unable to secure pain medication. [facility] regarding pain management.

## 2016-01-06 NOTE — Congregational Nurse Program (Signed)
Congregational Nurse Program Note  Date of Encounter: 12/21/2015  Past Medical History: Past Medical History:  Diagnosis Date  . Arthritis    "hips, knees, back" (10/13/2015)  . Bipolar disorder (HCC)   . CHF (congestive heart failure) (HCC)   . Coronary artery disease   . Depression   . DVT (deep venous thrombosis) (HCC) 09/2015   right  . Heart murmur   . High cholesterol   . Hypertension   . Myocardial infarction 10/12/2015 X 2-3  . Rectal myiasis   . Schizophrenia (HCC)   . Sciatica     Encounter Details:     CNP Questionnaire - 12/21/15 1143      Patient Demographics   Is this a new or existing patient? New   Patient is considered a/an Not Applicable   Race African-American/Black     Patient Assistance   Location of Patient Assistance [facility]   Patient's financial/insurance status Self-Pay (Uninsured)   Uninsured Patient (Orange Research officer, trade unionCard/Care Connects) Yes   Interventions Appt. has been completed   Patient referred to apply for the following financial assistance Not Applicable   Food insecurities addressed Not Applicable   Transportation assistance No   Type of Assistance Other   Assistance securing medications No   Educational Designer, television/film sethealth offerings Behavioral health     Encounter Details   Primary purpose of visit Spiritual Care/Support Visit   Was an Emergency Department visit averted? Not Applicable   Does patient have a medical provider? Yes   Patient referred to Not Applicable   Was a mental health screening completed? (GAINS tool) No   Does patient have dental issues? No   Does patient have vision issues? No   Does your patient have an abnormal blood pressure today? No   Since previous encounter, have you referred patient for abnormal blood pressure that resulted in a new diagnosis or medication change? No   Does your patient have an abnormal blood glucose today? No   Since previous encounter, have you referred patient for abnormal blood glucose that resulted in a  new diagnosis or medication change? No   Was there a life-saving intervention made? No         Amb Nursing Assessment - 12/30/15 1002      Pain Assessment   Pain Assessment 0-10   Pain Score 9    Pain Type Chronic pain   Pain Location Ankle     Nutrition Screen   Diabetes No     Functional Status   Activities of Daily Living Independent   Ambulation Independent   Medication Administration Independent   Home Management Independent     Risk/Barriers  Assessment   Barriers to Care Management & Learning None   Psychosocial Barriers Financial need     Abuse/Neglect Assessment   Do you feel unsafe in your current relationship? No   Do you feel physically threatened by others? No   Anyone hurting you at home, work, or school? No   Unable to ask? No    Client in for BP check. Requests bus passes to get to multiple appointments this week.

## 2016-01-06 NOTE — Progress Notes (Signed)
Pt arrived for scheduled blood pressure check. Denies chest pain, blurred vision, Shortness of breath.  Pt had question about a referral. He was informed that the referral for pain clinic was in process. He would receive a call from someone of appointment.

## 2016-01-07 ENCOUNTER — Telehealth: Payer: Self-pay | Admitting: Internal Medicine

## 2016-01-07 DIAGNOSIS — H538 Other visual disturbances: Secondary | ICD-10-CM

## 2016-01-07 NOTE — Telephone Encounter (Signed)
Requesting a referral to get an optometrist  Pt was scheduled an appointment on the 18th with PCP  Please contact pt if appointment with PCP is not needed

## 2016-01-07 NOTE — Telephone Encounter (Signed)
Pt is wanting to see a opthomologist to have an eye exam pt thinks he needs glasses and wanted to see about getting lasix

## 2016-01-11 NOTE — Progress Notes (Signed)
   Oren BinetDerrick Will, is a 45 y.o. male  ZOX:[id number]  WJX:[id number]  DOB - 10/16/1970  Chief Complaint  Patient presents with  . ear irragation        Subjective:   Oren BinetDerrick Corona is a 45 y.o. male here today for a follow up visit for left ear ceruminosis.  For RN visit for ear irrigation.    This note has been created with Education officer, environmentalDragon speech recognition software and smart phrase technology. Any transcriptional errors are unintentional.   Pete Glatterawn T Zafira Munos, MD, MBA/MHA [facility] [place], [place] [phone]   01/11/2016, 8:56 AM

## 2016-01-11 NOTE — Telephone Encounter (Signed)
I put in ref for optho, but if he does not have insurance, may be difficult to see due to cost. If he has money, cost abt $75 to see optometry where they can chk his eyes for glasses need as well, [facility] has eye clinics in them.

## 2016-01-18 ENCOUNTER — Ambulatory Visit: Payer: Self-pay | Admitting: Internal Medicine

## 2016-02-01 DIAGNOSIS — H109 Unspecified conjunctivitis: Secondary | ICD-10-CM

## 2016-02-01 HISTORY — DX: Unspecified conjunctivitis: H10.9

## 2016-02-05 MED FILL — PALIPERIDONE ER 6 MG TABLET: 6 | 30 days supply | Qty: 30 | Fill #0

## 2016-02-08 MED FILL — METOPROLOL SUCC ER 25 MG TA: 25 | 30 days supply | Qty: 15 | Fill #0

## 2016-02-24 ENCOUNTER — Encounter: Payer: Self-pay | Attending: Physical Medicine & Rehabilitation | Admitting: Physical Medicine & Rehabilitation

## 2016-02-24 ENCOUNTER — Encounter: Payer: Self-pay | Admitting: Physical Medicine & Rehabilitation

## 2016-02-24 VITALS — BP 142/94 | HR 85

## 2016-02-24 DIAGNOSIS — Z5181 Encounter for therapeutic drug level monitoring: Secondary | ICD-10-CM

## 2016-02-24 DIAGNOSIS — F209 Schizophrenia, unspecified: Secondary | ICD-10-CM | POA: Insufficient documentation

## 2016-02-24 DIAGNOSIS — Z9889 Other specified postprocedural states: Secondary | ICD-10-CM | POA: Insufficient documentation

## 2016-02-24 DIAGNOSIS — F319 Bipolar disorder, unspecified: Secondary | ICD-10-CM | POA: Insufficient documentation

## 2016-02-24 DIAGNOSIS — Z590 Homelessness: Secondary | ICD-10-CM | POA: Insufficient documentation

## 2016-02-24 DIAGNOSIS — M25571 Pain in right ankle and joints of right foot: Secondary | ICD-10-CM | POA: Insufficient documentation

## 2016-02-24 DIAGNOSIS — Z79899 Other long term (current) drug therapy: Secondary | ICD-10-CM

## 2016-02-24 DIAGNOSIS — I509 Heart failure, unspecified: Secondary | ICD-10-CM | POA: Insufficient documentation

## 2016-02-24 DIAGNOSIS — I110 Hypertensive heart disease with heart failure: Secondary | ICD-10-CM | POA: Insufficient documentation

## 2016-02-24 DIAGNOSIS — I2510 Atherosclerotic heart disease of native coronary artery without angina pectoris: Secondary | ICD-10-CM | POA: Insufficient documentation

## 2016-02-24 DIAGNOSIS — E7800 Pure hypercholesterolemia, unspecified: Secondary | ICD-10-CM | POA: Insufficient documentation

## 2016-02-24 DIAGNOSIS — S86001A Unspecified injury of right Achilles tendon, initial encounter: Secondary | ICD-10-CM

## 2016-02-24 DIAGNOSIS — M2141 Flat foot [pes planus] (acquired), right foot: Secondary | ICD-10-CM

## 2016-02-24 DIAGNOSIS — F1410 Cocaine abuse, uncomplicated: Secondary | ICD-10-CM | POA: Insufficient documentation

## 2016-02-24 DIAGNOSIS — R531 Weakness: Secondary | ICD-10-CM | POA: Insufficient documentation

## 2016-02-24 DIAGNOSIS — S86011S Strain of right Achilles tendon, sequela: Secondary | ICD-10-CM

## 2016-02-24 DIAGNOSIS — I252 Old myocardial infarction: Secondary | ICD-10-CM | POA: Insufficient documentation

## 2016-02-24 DIAGNOSIS — M1990 Unspecified osteoarthritis, unspecified site: Secondary | ICD-10-CM | POA: Insufficient documentation

## 2016-02-24 MED ORDER — DICLOFENAC SODIUM 1 % TD GEL: 2.0000 g | Freq: Four times a day (QID) | TRANSDERMAL | 1 refills | Status: DC

## 2016-02-24 MED ORDER — GABAPENTIN 300 MG PO CAPS: 300.0000 mg | ORAL_CAPSULE | Freq: Three times a day (TID) | ORAL | 1 refills | Status: DC

## 2016-02-24 MED ORDER — METHOCARBAMOL 500 MG PO TABS: 500.0000 mg | ORAL_TABLET | Freq: Two times a day (BID) | ORAL | 1 refills | Status: DC | PRN

## 2016-02-24 MED FILL — GABAPENTIN 300 MG CAPSULE: 300 | 30 days supply | Qty: 90 | Fill #0

## 2016-02-24 MED FILL — METHOCARBAMOL 500 MG TABLET: 500 | 30 days supply | Qty: 60 | Fill #0

## 2016-02-24 MED FILL — DICLOFENAC SODIUM 1% GEL: 1 | 13 days supply | Qty: 100 | Fill #0

## 2016-02-24 NOTE — Progress Notes (Signed)
Subjective:    Patient ID: Brett Salinas, male    DOB: [birth year], 47 y.o.   MRN: [id number]  HPI 46 y/o male with pmh/psh of depression, CHF, schizophrenia, bipolar disorder, HTN, DVT, arthritis, CAD, cocaine abuse, right achilles tendon rupture repair 10/2014 presents for evaluation for bracing of right ankle pain. Pain started 09/2014 after accident.  Pain is better at times than others.  Achy, burning pain.  Biofreeze helps.  Pressure on it makes it worse.  Radiates to distal foot.  Constant.  Associated numbness, tingling.  Compression socks help.  Tramadol does not help.  Pt states he has had several falls in the snow/ice.  He states he stumbles at times.  Pain inhibits mobility.  Pt is currently homeless.   Pain Inventory Average Pain 7 Pain Right Now 9 My pain is .  In the last 24 hours, has pain interfered with the following? General activity 6 Relation with others 3 Enjoyment of life 9 What TIME of day is your pain at its worst? night Sleep (in general) Poor  Pain is worse with: walking and bending Pain improves with: pacing activities and medication Relief from Meds: 8  Mobility walk without assistance ability to climb steps?  yes do you drive?  no use a wheelchair  Function disabled: date disabled 2001 I need assistance with the following:  household duties  Neuro/Psych weakness tingling trouble walking spasms loss of taste or smell  Prior Studies Any changes since last visit?  no  Physicians involved in your care Any changes since last visit?  no   No family history on file. Social History   Social History  . Marital status: Single    Spouse name: N/A  . Number of children: N/A  . Years of education: N/A   Social History Main Topics  . Smoking status: Never Smoker  . Smokeless tobacco: Never Used  . Alcohol use Yes     Comment: 10/13/2015 "might have a drink q blue moon"  . Drug use: Yes     Comment: 10/13/2015 "I've tried alot; I don't have  any habits"  . Sexual activity: Not Asked   Other Topics Concern  . None   Social History Narrative  . None   Past Surgical History:  Procedure Laterality Date  . CARDIAC CATHETERIZATION N/A 10/13/2015   Procedure: Left Heart Cath and Coronary Angiography;  Surgeon: Corky Crafts, MD;  [facility]VAS[facility] CV LAB;  Service: Cardiovascular;  Laterality: N/A;  . CARDIAC CATHETERIZATION N/A 10/13/2015   Procedure: Coronary Stent Intervention;  Surgeon: Corky Crafts, MD;  Location: [facility] INVASIVE CV LAB;  Service: Cardiovascular;  Laterality: N/A;  . CARDIAC CATHETERIZATION N/A 10/13/2015   Procedure: Intravascular Ultrasound/IVUS;  Surgeon: Corky Crafts, MD;  Location: [facility] INVAS[facility] CV LAB;  Service: Cardiovascular;  Laterality: N/A;  . CORONARY ANGIOPLASTY WITH STENT PLACEMENT  10/13/2015  . I&D EXTREMITY Right 09/19/2015   Procedure: IRRIGATION AND DEBRIDEMENT ANKLE LACERATIONS POSSIBLE TENDON REPAIR;  Surgeon: Cammy Copa, MD;  Location: [facility] OR;  Service: Orthopedics;  Laterality: Right;  . I&D EXTREMITY Right 10/14/2015   Procedure: IRRIGATION AND DEBRIDEMENT EXTREMITY/Right Ankle;  Surgeon: Cammy Copa, MD;  Location: [facility] OR;  Service: Orthopedics;  Laterality: Right;   Past Medical History:  Diagnosis Date  . Arthritis    "hips, knees, back" (10/13/2015)  . Bipolar disorder (HCC)   . CHF (congestive heart failure) (HCC)   . Coronary artery disease   . Depression   .  DVT (deep venous thrombosis) (HCC) 09/2015   right  . Heart murmur   . High cholesterol   . Hypertension   . Myocardial infarction 10/12/2015 X 2-3  . Rectal myiasis   . Schizophrenia (HCC)   . Sciatica    BP (!) 142/94   Pulse 85   SpO2 97%   Opioid Risk Score:   Fall Risk Score:  `1  Depression screen PHQ 2/9  Dep[facility] screen [facility] 12/30/2015 10/20/2015  Decreased Interest 0 2  Down, Depressed, Hopeless 1 3  PHQ - 2 Score 1 5  Altered sleeping - 2  Tired, decreased energy  - 1  Change in appetite - 2  Feeling bad or failure about yourself  - 3  Trouble concentrating - 1  Moving slowly or fidgety/restless - 0  Suicidal thoughts - 3  PHQ-9 Score - 17   Review of Systems  HENT: Negative.   Eyes: Negative.   Respiratory: Negative.   Cardiovascular: Negative.   Gastrointestinal: Negative.   Endocrine: Negative.   Genitourinary: Negative.   Musculoskeletal: Positive for arthralgias, back pain and gait problem.  Skin: Negative.   Allergic/Immunologic: Negative.   Neurological: Positive for weakness.  Hematological: Negative.   Psychiatric/Behavioral: Negative.   All other systems reviewed and are negative.     Objective:   Physical Exam Gen: NAD. Vital signs reviewed HENT: Normocephalic, Atraumatic Eyes: EOMI. No discharge.  Cardio: RRR. No JVD.  Pulm: B/l clear to auscultation.  Effort normal Abd: Soft, BS+ MSK:  Gait antalgic.   TTP right ankle.   No edema.   Pes planus on right  Neuro:   Sensation diminished to light touch right foot  Strength  5/5 in all LE myotomes Skin: Warm and Dry. Surgical scars right foot.    Assessment & Plan:  46 y/o male with pmh of depression, CHF, schizophrenia, bipolar disorder, HTN, DVT, arthritis, CAD, cocaine abuse, achilles tendon rupture repair presents for evaluation for bracing of ankle pain.   1. Right ankle pain  NCCSRS reviewed  Referral information reviewed  Xray ankle reviewed showing soft tissue injury  Cont ice/heat  Trial Robaxin 500 BID PRN  Will order Gabapentin 300 TID  Will order Voltaren gel   Will order bracing for foot   Narcotics not appropriate given history

## 2016-03-01 LAB — TOXASSURE SELECT,+ANTIDEPR,UR

## 2016-03-04 ENCOUNTER — Telehealth: Payer: Self-pay

## 2016-03-04 NOTE — Telephone Encounter (Signed)
Thank you, I will discuss it with him in his follow up appointment.  We did not intend to, nor will we be prescribing narcotics to him.

## 2016-03-04 NOTE — Telephone Encounter (Signed)
Urine drug screen for this encounter is Inconsistent. Patient urine drug screen came back positive with Buprenorphine. [facility] has not been prescribed this medication. Pleas see lab report and advise.

## 2016-03-04 NOTE — Progress Notes (Signed)
Urine drug screen for this encounter is Inconsistent. Patient urine drug screen came back positive with Buprenorphine. [facility] has not been prescribed this medication.

## 2016-03-11 ENCOUNTER — Telehealth: Payer: Self-pay

## 2016-03-11 NOTE — Telephone Encounter (Signed)
Called over to lisa after faxing prescription, verified that the script was indeeded recieved

## 2016-03-11 NOTE — Telephone Encounter (Signed)
Lisa from [facility] called stating they need a prescription faxed to them for this patients brace that was ordered.  Fax# [phone]

## 2016-03-17 ENCOUNTER — Encounter: Payer: Self-pay | Admitting: Physician Assistant

## 2016-03-17 ENCOUNTER — Ambulatory Visit: Payer: Self-pay | Attending: Physician Assistant | Admitting: Physician Assistant

## 2016-03-17 VITALS — BP 130/75 | HR 75 | Temp 100.3°F | Resp 20 | Wt 207.0 lb

## 2016-03-17 DIAGNOSIS — R509 Fever, unspecified: Secondary | ICD-10-CM | POA: Insufficient documentation

## 2016-03-17 DIAGNOSIS — Z79899 Other long term (current) drug therapy: Secondary | ICD-10-CM | POA: Insufficient documentation

## 2016-03-17 DIAGNOSIS — F209 Schizophrenia, unspecified: Secondary | ICD-10-CM | POA: Insufficient documentation

## 2016-03-17 DIAGNOSIS — Z7902 Long term (current) use of antithrombotics/antiplatelets: Secondary | ICD-10-CM | POA: Insufficient documentation

## 2016-03-17 DIAGNOSIS — R059 Cough, unspecified: Secondary | ICD-10-CM

## 2016-03-17 DIAGNOSIS — I509 Heart failure, unspecified: Secondary | ICD-10-CM | POA: Insufficient documentation

## 2016-03-17 DIAGNOSIS — I110 Hypertensive heart disease with heart failure: Secondary | ICD-10-CM | POA: Insufficient documentation

## 2016-03-17 DIAGNOSIS — R05 Cough: Secondary | ICD-10-CM | POA: Insufficient documentation

## 2016-03-17 DIAGNOSIS — Z7982 Long term (current) use of aspirin: Secondary | ICD-10-CM | POA: Insufficient documentation

## 2016-03-17 DIAGNOSIS — F319 Bipolar disorder, unspecified: Secondary | ICD-10-CM | POA: Insufficient documentation

## 2016-03-17 MED ORDER — BENZONATATE 100 MG PO CAPS: 100.0000 mg | ORAL_CAPSULE | Freq: Two times a day (BID) | ORAL | 0 refills | Status: DC | PRN

## 2016-03-17 MED ORDER — AMOXICILLIN-POT CLAVULANATE 875-125 MG PO TABS: 1.0000 | ORAL_TABLET | Freq: Two times a day (BID) | ORAL | 0 refills | Status: AC

## 2016-03-17 MED ORDER — NAPROXEN 500 MG PO TABS: 500.0000 mg | ORAL_TABLET | Freq: Two times a day (BID) | ORAL | 0 refills | Status: DC

## 2016-03-17 MED FILL — NAPROXEN 500 MG TABLET: 500 | 15 days supply | Qty: 30 | Fill #0

## 2016-03-17 MED FILL — CLOPIDOGREL 75 MG TABLET: 75 | 30 days supply | Qty: 30 | Fill #1

## 2016-03-17 MED FILL — BENZONATATE 100 MG CAPSULE: 100 | 10 days supply | Qty: 20 | Fill #0

## 2016-03-17 MED FILL — AMOX-CLAV 875-125 MG TABLET: 875-125 | 10 days supply | Qty: 20 | Fill #0

## 2016-03-17 NOTE — Patient Instructions (Signed)
Please go to [facility] and get a chest xray. Take your antibiotic every day until completion. Go to the emergency room should you have worsening symptoms.   Cough, Adult Coughing is a reflex that clears your throat and your airways. Coughing helps to heal and protect your lungs. It is normal to cough occasionally, but a cough that happens with other symptoms or lasts a long time may be a sign of a condition that needs treatment. A cough may last only 2-3 weeks (acute), or it may last longer than 8 weeks (chronic). What are the causes? Coughing is commonly caused by:  Breathing in substances that irritate your lungs.  A viral or bacterial respiratory infection.  Allergies.  Asthma.  Postnasal drip.  Smoking.  Acid backing up from the stomach into the esophagus (gastroesophageal reflux).  Certain medicines.  Chronic lung problems, including COPD (or rarely, lung cancer).  Other medical conditions such as heart failure. Follow these instructions at home: Pay attention to any changes in your symptoms. Take these actions to help with your discomfort:  Take medicines only as told by your health care provider.  If you were prescribed an antibiotic medicine, take it as told by your health care provider. Do not stop taking the antibiotic even if you start to feel better.  Talk with your health care provider before you take a cough suppressant medicine.  Drink enough fluid to keep your urine clear or pale yellow.  If the air is dry, use a cold steam vaporizer or humidifier in your bedroom or your home to help loosen secretions.  Avoid anything that causes you to cough at work or at home.  If your cough is worse at night, try sleeping in a semi-upright position.  Avoid cigarette smoke. If you smoke, quit smoking. If you need help quitting, ask your health care provider.  Avoid caffeine.  Avoid alcohol.  Rest as needed. Contact a health care  provider if:  You have new symptoms.  You cough up pus.  Your cough does not get better after 2-3 weeks, or your cough gets worse.  You cannot control your cough with suppressant medicines and you are losing sleep.  You develop pain that is getting worse or pain that is not controlled with pain medicines.  You have a fever.  You have unexplained weight loss.  You have night sweats. Get help right away if:  You cough up blood.  You have difficulty breathing.  Your heartbeat is very fast. This information is not intended to replace advice given to you by your health care provider. Make sure you discuss any questions you have with your health care provider. Document Released: 07/16/2010 Document Revised: 06/25/2015 Document Reviewed: 03/26/2014 Elsevier Interactive Patient Education  2017 [employer]

## 2016-03-17 NOTE — Progress Notes (Signed)
Subjective:  Patient ID: Brett BinetDerrick First, male    DOB: 01/27/1971  Age: 46 y.o. MRN: [id number]  CC: Cough  HPI Brett Salinas is a 46 y.o. male with a medical history of CHF, DVT, HTN, MI, schizophrenia, and bipolar disorder who presents with a five-day history of cough, malaise, fatigue, and fever. He lives in a homeless shelter and some of the people that live with him has displayed signs of illness. Has not tried anything for relief. Denies chest pain, shortness of breath, headache, abdominal pain, nausea, vomiting, rash, or GI/GU symptoms     Outpatient Medications Prior to Visit  Medication Sig Dispense Refill  . aspirin EC 81 MG tablet Take 1 tablet (81 mg total) by mouth daily. 90 tablet 3  . clopidogrel (PLAVIX) 75 MG tablet Take 1 tablet (75 mg total) by mouth daily. 90 tablet 3  . diclofenac sodium (VOLTAREN) 1 % GEL Apply 2 g topically 4 (four) times daily. 1 Tube 1  . gabapentin (NEURONTIN) 300 MG capsule Take 1 capsule (300 mg total) by mouth 3 (three) times daily. 90 capsule 1  . methocarbamol (ROBAXIN) 500 MG tablet Take 1 tablet (500 mg total) by mouth 2 (two) times daily as needed for muscle spasms. 60 tablet 1  . metoprolol succinate (TOPROL XL) 25 MG 24 hr tablet Take 0.5 tablets (12.5 mg total) by mouth daily. 45 tablet 3  . nitroGLYCERIN (NITROSTAT) 0.4 MG SL tablet Place 1 tablet (0.4 mg total) under the tongue every 5 (five) minutes x 3 doses as needed for chest pain. (Patient not taking: Reported on 02/24/2016) 30 tablet 12  . traMADol (ULTRAM) 50 MG tablet Take by mouth 2 (two) times daily.    Marland Kitchen. atorvastatin (LIPITOR) 40 MG tablet Take 1 tablet (40 mg total) by mouth daily at 6 PM. (Patient not taking: Reported on 02/24/2016) 30 tablet 2  . docusate sodium (COLACE) 100 MG capsule Take 1 capsule (100 mg total) by mouth 2 (two) times daily. (Patient not taking: Reported on 02/24/2016) 10 capsule 0  . doxycycline (VIBRA-TABS) 100 MG tablet Take 1 tablet (100 mg total) by  mouth every 12 (twelve) hours. (Patient not taking: Reported on 02/24/2016) 28 tablet 0  . nitroGLYCERIN (NITRODUR - DOSED IN MG/24 HR) 0.2 mg/hr patch Place 1 patch (0.2 mg total) onto the skin daily. (Patient not taking: Reported on 03/17/2016) 30 patch 3  . oxyCODONE (OXY IR/ROXICODONE) 5 MG immediate release tablet Take 1-2 tablets (5-10 mg total) by mouth every 3 (three) hours as needed for breakthrough pain. (Patient not taking: Reported on 02/24/2016) 50 tablet 0   No facility-administered medications prior to visit.      ROS Review of Systems  Constitutional: Positive for chills, fever and malaise/fatigue.  HENT: Negative for ear pain, sinus pain and sore throat.   Eyes: Negative for pain.  Respiratory: Positive for cough. Negative for shortness of breath.   Cardiovascular: Negative for chest pain.  Gastrointestinal: Negative for abdominal pain, nausea and vomiting.  Genitourinary: Negative for dysuria.  Musculoskeletal: Negative for myalgias.  Skin: Negative for rash.  Neurological: Negative for headaches.    Objective:  BP 130/75 (BP Location: Left Arm, Patient Position: Sitting, Cuff Size: Large)   Pulse 75   Temp 100.3 F (37.9 C) (Oral)   Resp 20   Wt 207 lb (93.9 kg)   SpO2 97%   BMI 23.32 kg/m   BP/Weight 03/17/2016 02/24/2016 01/06/2016  Systolic BP 130 142 136  Diastolic BP 75  94 90  Wt. (Lbs) 207 - -  BMI 23.32 - -      Physical Exam  Constitutional: He is oriented to person, place, and time.  Mildly ill, NAD, polite  HENT:  Head: Normocephalic and atraumatic.  TM mildly erythematous bilaterally, turbinates erythematous and hypertrophic, oropharynx with mild postnasal drip and no exudates.   Eyes: Conjunctivae are normal. No scleral icterus.  Neck: Normal range of motion. Neck supple. No thyromegaly present.  Cardiovascular: Normal rate, regular rhythm and normal heart sounds.   Pulmonary/Chest: Effort normal. He has no wheezes. He has no rales.    Musculoskeletal: He exhibits no edema.  Lymphadenopathy:    He has cervical adenopathy (mild anterior cervical lymphadenopathy).  Neurological: He is alert and oriented to person, place, and time.  Skin: Skin is warm and dry. No rash noted.  Psychiatric: He has a normal mood and affect. His behavior is normal.     Assessment & Plan:   1. Cough -Suspected URI. Will treat with supportive therapy. However, I have instructed patient take Augmentin should his symptoms persist or worsen. - DG Chest 2 View; Future  2. Fever, unspecified fever cause - DG Chest 2 View; Future   Meds ordered this encounter  Medications  . amoxicillin-clavulanate (AUGMENTIN) 875-125 MG tablet    Sig: Take 1 tablet by mouth 2 (two) times daily.    Dispense:  20 tablet    Refill:  0    Order Specific Question:   Supervising Provider    Answer:   Quentin Angst [id number]  . benzonatate (TESSALON) 100 MG capsule    Sig: Take 1 capsule (100 mg total) by mouth 2 (two) times daily as needed for cough.    Dispense:  20 capsule    Refill:  0    Order Specific Question:   Supervising Provider    Answer:   Quentin Angst [id number]  . naproxen (NAPROSYN) 500 MG tablet    Sig: Take 1 tablet (500 mg total) by mouth 2 (two) times daily with a meal.    Dispense:  30 tablet    Refill:  0    Order Specific Question:   Supervising Provider    Answer:   Quentin Angst [[id number]]    Follow-up: Return in about 4 weeks (around 04/14/2016) for Hyperlipidemia.   Loletta Specter PA

## 2016-03-22 ENCOUNTER — Ambulatory Visit (INDEPENDENT_AMBULATORY_CARE_PROVIDER_SITE_OTHER): Payer: Self-pay | Admitting: Cardiology

## 2016-03-22 ENCOUNTER — Encounter: Payer: Self-pay | Admitting: Cardiology

## 2016-03-22 VITALS — BP 126/72 | HR 60 | Ht 79.0 in | Wt 208.0 lb

## 2016-03-22 DIAGNOSIS — Z9861 Coronary angioplasty status: Secondary | ICD-10-CM

## 2016-03-22 DIAGNOSIS — Z91148 Patient's other noncompliance with medication regimen for other reason: Secondary | ICD-10-CM

## 2016-03-22 DIAGNOSIS — E782 Mixed hyperlipidemia: Secondary | ICD-10-CM

## 2016-03-22 DIAGNOSIS — Z789 Other specified health status: Secondary | ICD-10-CM

## 2016-03-22 DIAGNOSIS — I10 Essential (primary) hypertension: Secondary | ICD-10-CM

## 2016-03-22 DIAGNOSIS — I2510 Atherosclerotic heart disease of native coronary artery without angina pectoris: Secondary | ICD-10-CM

## 2016-03-22 DIAGNOSIS — Z9114 Patient's other noncompliance with medication regimen: Secondary | ICD-10-CM

## 2016-03-22 NOTE — Patient Instructions (Signed)
Medication Instructions:   Your physician recommends that you continue on your current medications as directed. Please refer to the Current Medication list given to you today.     Follow-Up:  3 MONTHS WITH DR NELSON       If you need a refill on your cardiac medications before your next appointment, please call your pharmacy.   

## 2016-03-22 NOTE — Progress Notes (Signed)
03/22/2016 Brett Salinas   11/22/1970  [id number]  Primary Physician Dawn Marland Mcalpine Langeland, MD Primary Cardiologist: Dr. Delton SeeNelson    Reason for Visit/CC: Post ER follow up.  HPI:  [name] recently established are with our practice during recent hospitalization. In summary, he is a 46 year old schizophrenic, bipolar AA male who was recently released from prison. He was living in a hotel when a porcelain sink fell on his right heel, severing his Rt achilles tendon. He underwent I&D and tendon repair 09/19/15. He was discharged 10/01/15. Post op he did not fill his Xarelto Rx but took ASA. He presented to the ED 10/12/15 with exertional chest pain and SOB that had been going on for two days. His drug screen was positive for cocaine. His Troponin came back positive- pk 0.18. Cath done 10/13/15 revealed a 90% proximal LAD and no other significant CAD. He underwent successful PCI with DES. Echo revealed normal left ventricular EF at 60-65%. Mild MR was also noted. He was placed on dual antiplatelet therapy with aspirin and Brilinta as well as carvedilol and atorvastatin. I evaluated him for post hospital f/u on 10/29/15. He was doing well from a cardiac standpoint.  12/14/2015 - this is a 2 week follow-up, the patient was seen by [name] 2 weeks ago where he admitted he is not taking his medications. Today he states again that he doesn't believe in medications and hasn't taken any of them. He is coming accompanied by an orthotist priest who he describes as his family. He states that he hasn't been experiencing any chest pain or shortness of breath or palpitations. However he states that on 12/07/2015 he experienced a syncopal episode while in the bathroom. He denies any chest pain or palpitations prior to her episode. He was taken to the ER for ACS was ruled out. He states that he has been disconnected to his family he has multiple children and grandchildren. He reinforces that he doesn't believe that his  body needs medicines and also states that those medicines are costing his church a lot of money.  03/22/2016 - the patient is coming after 3 months, he has been doing okay until last week when he developed fever chills and cough and was diagnosed with pneumonia. He was started on Augmentin by his primary care physician. He doesn't have any more fever or chills but continues to cough and feels short of breath. He denies any lower extremity edema. His chest pain only when he coughs. No dizziness or syncope.     Current Meds  Medication Sig  . amoxicillin-clavulanate (AUGMENTIN) 875-125 MG tablet Take 1 tablet by mouth 2 (two) times daily.  Marland Kitchen. aspirin EC 81 MG tablet Take 1 tablet (81 mg total) by mouth daily.  . benzonatate (TESSALON) 100 MG capsule Take 1 capsule (100 mg total) by mouth 2 (two) times daily as needed for cough.  . clopidogrel (PLAVIX) 75 MG tablet Take 1 tablet (75 mg total) by mouth daily.  . diclofenac sodium (VOLTAREN) 1 % GEL Apply 2 g topically 4 (four) times daily.  Marland Kitchen. gabapentin (NEURONTIN) 300 MG capsule Take 1 capsule (300 mg total) by mouth 3 (three) times daily.  . metoprolol succinate (TOPROL XL) 25 MG 24 hr tablet Take 0.5 tablets (12.5 mg total) by mouth daily.  . naproxen (NAPROSYN) 500 MG tablet Take 1 tablet (500 mg total) by mouth 2 (two) times daily with a meal.  . nitroGLYCERIN (NITROSTAT) 0.4 MG SL tablet Place 1 tablet (0.4 mg  total) under the tongue every 5 (five) minutes x 3 doses as needed for chest pain.  . [DISCONTINUED] methocarbamol (ROBAXIN) 500 MG tablet Take 1 tablet (500 mg total) by mouth 2 (two) times daily as needed for muscle spasms.  . [DISCONTINUED] traMADol (ULTRAM) 50 MG tablet Take by mouth 2 (two) times daily.   Allergies  Allergen Reactions  . 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 Heparin Itching    Full-body itching inc throat  . Pork-Derived Products Itching    Burning/itching   Past Medical History:  Diagnosis Date  . Arthritis     "hips, knees, back" (10/13/2015)  . Bipolar disorder (HCC)   . CHF (congestive heart failure) (HCC)   . Coronary artery disease   . Depression   . DVT (deep venous thrombosis) (HCC) 09/2015   right  . Heart murmur   . High cholesterol   . Hypertension   . Myocardial infarction 10/12/2015 X 2-3  . Rectal myiasis   . Schizophrenia (HCC)   . Sciatica    No family history on file. Past Surgical History:  Procedure Laterality Date  . CARDIAC CATHETERIZATION N/A 10/13/2015   Procedure: Left Heart Cath and Coronary Angiography;  Surgeon: Corky Crafts, MD;  Location: [facility] INVASIVE CV LAB;  Service: Cardiovascular;  Laterality: N/A;  . CARDIAC CATHETERIZATION N/A 10/13/2015   Procedure: Coronary Stent Intervention;  Surgeon: Corky Crafts, MD;  Location: [facility] INVASIVE CV LAB;  Service: Cardiovascular;  Laterality: N/A;  . CARDIAC CATHETERIZATION N/A 10/13/2015   Procedure: Intravascular Ultrasound/IVUS;  Surgeon: Corky Crafts, MD;  Location: [facility] INVASIVE CV LAB;  Service: Cardiovascular;  Laterality: N/A;  . CORONARY ANGIOPLASTY WITH STENT PLACEMENT  10/13/2015  . I&D EXTREMITY Right 09/19/2015   Procedure: IRRIGATION AND DEBRIDEMENT ANKLE LACERATIONS POSSIBLE TENDON REPAIR;  Surgeon: Cammy Copa, MD;  Location: [facility] OR;  Service: Orthopedics;  Laterality: Right;  . I&D EXTREMITY Right 10/14/2015   Procedure: IRRIGATION AND DEBRIDEMENT EXTREMITY/Right Ankle;  Surgeon: Cammy Copa, MD;  Location: [facility] OR;  Service: Orthopedics;  Laterality: Right;   Social History   Social History  . Marital status: Single    Spouse name: N/A  . Number of children: N/A  . Years of education: N/A   Occupational History  . Not on file.   Social History Main Topics  . Smoking status: Never Smoker  . Smokeless tobacco: Never Used  . Alcohol use Yes     Comment: 10/13/2015 "might have a drink q blue moon"  . Drug use: Yes     Comment: 10/13/2015 "I've tried alot; I don't have any  habits"  . Sexual activity: Not on file   Other Topics Concern  . Not on file   Social History Narrative  . No narrative on file     Review of Systems: General: negative for chills, fever, night sweats or weight changes.  Cardiovascular: negative for chest pain, dyspnea on exertion, edema, orthopnea, palpitations, paroxysmal nocturnal dyspnea or shortness of breath Dermatological: negative for rash Respiratory: negative for cough or wheezing Urologic: negative for hematuria Abdominal: negative for nausea, vomiting, diarrhea, bright red blood per rectum, melena, or hematemesis Neurologic: negative for visual changes, syncope, or dizziness All other systems reviewed and are otherwise negative except as noted above.   Physical Exam:  Blood pressure 126/72, pulse 60, height 6\' 7"  (2.007 m), weight 208 lb (94.3 kg), SpO2 98 %.  General appearance: alert, cooperative, no distress Neck: no carotid bruit and no JVD  Lungs: Crackles and rales at the left lower base more than right. Heart: regular rhythm, tachy rate, S1, S2 normal, no murmur, click, rub or gallop Extremities: no LEE Pulses: 2+ and symmetric Skin: warm and dry Neurologic:  Grossly normal  EKG: Performed in the ER on 12/07/2015 shows sinus rhythm with ventricular rate 96 bpm, repolarization abnormalities in anterior leads.   ASSESSMENT AND PLAN:   1. CAD: s/p  NSTEMI with 90% LAD stenosis, S/p PCI + DES on 10/2015.  He was previously noncompliant however currently taking metoprolol aspirin and Plavix. He doesn't have any exertional chest pain only pleuritic chest pain associated with pneumonia.   2. Pneumonia - being treated by his primary care physician he continued to sound very congested, he is advised to follow-up with his PCP once he completes antibiotics.  3. HTN: Currently controlled after restarting metoprolol will continue.  4. HLD: He is not willing to take any statins as they're causing him muscle ache and  his concern about price.  PLAN  Keep f/u with Dr. Delton See in 3 months.   Tobias Alexander , MD 03/22/2016 11:19 AM

## 2016-04-06 ENCOUNTER — Encounter: Payer: Self-pay | Admitting: Physical Medicine & Rehabilitation

## 2016-04-06 ENCOUNTER — Encounter: Payer: Self-pay | Attending: Physical Medicine & Rehabilitation | Admitting: Physical Medicine & Rehabilitation

## 2016-04-06 VITALS — BP 139/92 | HR 95

## 2016-04-06 DIAGNOSIS — M25571 Pain in right ankle and joints of right foot: Secondary | ICD-10-CM | POA: Insufficient documentation

## 2016-04-06 DIAGNOSIS — E7800 Pure hypercholesterolemia, unspecified: Secondary | ICD-10-CM | POA: Insufficient documentation

## 2016-04-06 DIAGNOSIS — F319 Bipolar disorder, unspecified: Secondary | ICD-10-CM | POA: Insufficient documentation

## 2016-04-06 DIAGNOSIS — F209 Schizophrenia, unspecified: Secondary | ICD-10-CM | POA: Insufficient documentation

## 2016-04-06 DIAGNOSIS — I110 Hypertensive heart disease with heart failure: Secondary | ICD-10-CM | POA: Insufficient documentation

## 2016-04-06 DIAGNOSIS — I509 Heart failure, unspecified: Secondary | ICD-10-CM | POA: Insufficient documentation

## 2016-04-06 DIAGNOSIS — S86011S Strain of right Achilles tendon, sequela: Secondary | ICD-10-CM

## 2016-04-06 DIAGNOSIS — S86001A Unspecified injury of right Achilles tendon, initial encounter: Secondary | ICD-10-CM

## 2016-04-06 DIAGNOSIS — Z590 Homelessness: Secondary | ICD-10-CM | POA: Insufficient documentation

## 2016-04-06 DIAGNOSIS — I2510 Atherosclerotic heart disease of native coronary artery without angina pectoris: Secondary | ICD-10-CM | POA: Insufficient documentation

## 2016-04-06 DIAGNOSIS — I252 Old myocardial infarction: Secondary | ICD-10-CM | POA: Insufficient documentation

## 2016-04-06 DIAGNOSIS — M2141 Flat foot [pes planus] (acquired), right foot: Secondary | ICD-10-CM

## 2016-04-06 DIAGNOSIS — M1990 Unspecified osteoarthritis, unspecified site: Secondary | ICD-10-CM | POA: Insufficient documentation

## 2016-04-06 DIAGNOSIS — Z9889 Other specified postprocedural states: Secondary | ICD-10-CM | POA: Insufficient documentation

## 2016-04-06 DIAGNOSIS — F1410 Cocaine abuse, uncomplicated: Secondary | ICD-10-CM | POA: Insufficient documentation

## 2016-04-06 DIAGNOSIS — R531 Weakness: Secondary | ICD-10-CM | POA: Insufficient documentation

## 2016-04-06 MED ORDER — GABAPENTIN 600 MG PO TABS: 600.0000 mg | ORAL_TABLET | Freq: Three times a day (TID) | ORAL | 1 refills | Status: DC

## 2016-04-06 MED ORDER — DICLOFENAC SODIUM 1 % TD GEL: 2.0000 g | Freq: Four times a day (QID) | TRANSDERMAL | 1 refills | Status: DC

## 2016-04-06 MED FILL — OLANZapine 10 MG TABS: 10 | 30 days supply | Qty: 30 | Fill #0

## 2016-04-06 MED FILL — VOLTAREN 1% GEL: 1 | 10 days supply | Qty: 100 | Fill #0

## 2016-04-06 MED FILL — GABAPENTIN 600 MG TABLET: 600 | 30 days supply | Qty: 90 | Fill #0

## 2016-04-06 NOTE — Progress Notes (Deleted)
Subjective:    Patient ID: Brett Salinas, male    DOB: 04-06-1970, 46 y.o.   MRN: [id number]  HPI   Pain Inventory Average Pain {NUMBERS; 0-10:5044} Pain Right Now {NUMBERS; 0-10:5044} My pain is {PAIN DESCRIPTION:21022940}  In the last 24 hours, has pain interfered with the following? General activity {NUMBERS; 0-10:5044} Relation with others {NUMBERS; 0-10:5044} Enjoyment of life {NUMBERS; 0-10:5044} What TIME of day is your pain at its worst? {TIME OF WUJ:81191478} Sleep (in general) {BHH GOOD/FAIR/POOR:22877}  Pain is worse with: {ACTIVITIES:21022942} Pain improves with: {PAIN IMPROVES GNFA:21308657} Relief from Meds: {NUMBERS; 0-10:5044}  Mobility {MOBILITY QIO:96295284}  Function {FUNCTION:21022946}  Neuro/Psych {NEURO/PSYCH:21022948}  Prior Studies {CPRM PRIOR STUDIES:21022953}  Physicians involved in your care {CPRM PHYSICIANS INVOLVED IN YOUR CARE:21022954}   No family history on file. Social History   Social History  . Marital status: Single    Spouse name: N/A  . Number of children: N/A  . Years of education: N/A   Social History Main Topics  . Smoking status: Never Smoker  . Smokeless tobacco: Never Used  . Alcohol use Yes     Comment: 10/13/2015 "might have a drink q blue moon"  . Drug use: Yes     Comment: 10/13/2015 "I've tried alot; I don't have any habits"  . Sexual activity: Not on file   Other Topics Concern  . Not on file   Social History Narrative  . No narrative on file   Past Surgical History:  Procedure Laterality Date  . CARDIAC CATHETERIZATION N/A 10/13/2015   Procedure: Left Heart Cath and Coronary Angiography;  Surgeon: Corky Crafts, MD;  Location: [facility] INVASIVE CV LAB;  Service: Cardiovascular;  Laterality: N/A;  . CARDIAC CATHETERIZATION N/A 10/13/2015   Procedure: Coronary Stent Intervention;  Surgeon: Corky Crafts, MD;  Location: [facility] INVASIVE CV LAB;  Service: Cardiovascular;  Laterality: N/A;  . CARDIAC  CATHETERIZATION N/A 10/13/2015   Procedure: Intravascular Ultrasound/IVUS;  Surgeon: Corky Crafts, MD;  Location: [facility] INVASIVE CV LAB;  Service: Cardiovascular;  Laterality: N/A;  . CORONARY ANGIOPLASTY WITH STENT PLACEMENT  10/13/2015  . I&D EXTREMITY Right 09/19/2015   Procedure: IRRIGATION AND DEBRIDEMENT ANKLE LACERATIONS POSSIBLE TENDON REPAIR;  Surgeon: Cammy Copa, MD;  Location: [facility] OR;  Service: Orthopedics;  Laterality: Right;  . I&D EXTREMITY Right 10/14/2015   Procedure: IRRIGATION AND DEBRIDEMENT EXTREMITY/Right Ankle;  Surgeon: Cammy Copa, MD;  Location: [facility] OR;  Service: Orthopedics;  Laterality: Right;   Past Medical History:  Diagnosis Date  . Arthritis    "hips, knees, back" (10/13/2015)  . Bipolar disorder (HCC)   . CHF (congestive heart failure) (HCC)   . Coronary artery disease   . Depression   . DVT (deep venous thrombosis) (HCC) 09/2015   right  . Heart murmur   . High cholesterol   . Hypertension   . Myocardial infarction 10/12/2015 X 2-3  . Rectal myiasis   . Schizophrenia (HCC)   . Sciatica    There were no vitals taken for this visit.  Opioid Risk Score:   Fall Risk Score:  `1  Depression screen PHQ 2/9  Depression screen [facility] 2/9 12/30/2015 10/20/2015  Decreased Interest 0 2  Down, Depressed, Hopeless 1 3  PHQ - 2 Score 1 5  Altered sleeping - 2  Tired, decreased energy - 1  Change in appetite - 2  Feeling bad or failure about yourself  - 3  Trouble concentrating - 1  Moving slowly or fidgety/restless -  0  Suicidal thoughts - 3  PHQ-9 Score - 17    Review of Systems     Objective:   Physical Exam        Assessment & Plan:

## 2016-04-06 NOTE — Progress Notes (Signed)
Subjective:    Patient ID: Brett Salinas, male    DOB: 08/24/1970, 46 y.o.   MRN: [id number]  HPI 46 y/o male with pmh/psh of depression, CHF, schizophrenia, bipolar disorder, HTN, DVT, arthritis, CAD, cocaine abuse, right achilles tendon rupture repair 10/2014 presents for follow up for bracing of right ankle pain.  Initially stated: Pain started 09/2014 after accident.  Pain is better at times than others.  Achy, burning pain.  Biofreeze helps.  Pressure on it makes it worse.  Radiates to distal foot.  Constant.  Associated numbness, tingling.  Compression socks help.  Tramadol does not help.  Pt states he has had several falls in the snow/ice.  He states he stumbles at times.  Pain inhibits mobility.  Pt is currently homeless.   Last clinic visit 02/24/16.  Since that time, pt continues to use heat/ice, ice seems to help more.  Robaxin helps a little bit.  Gabapentin helps a little bit.  Voltaren gel helps a little.  He went to get fitted for the brace, but is waiting to hear back.   Pain Inventory Average Pain 7 Pain Right Now 9 My pain is .  In the last 24 hours, has pain interfered with the following? General activity 6 Relation with others 3 Enjoyment of life 9 What TIME of day is your pain at its worst? night Sleep (in general) Poor  Pain is worse with: walking and bending Pain improves with: pacing activities and medication Relief from Meds: 8  Mobility walk without assistance ability to climb steps?  yes do you drive?  no use a wheelchair  Function disabled: date disabled 2001 I need assistance with the following:  household duties  Neuro/Psych weakness tingling trouble walking spasms loss of taste or smell  Prior Studies Any changes since last visit?  no  Physicians involved in your care Any changes since last visit?  no   No family history on file. Social History   Social History  . Marital status: Single    Spouse name: N/A  . Number of children:  N/A  . Years of education: N/A   Social History Main Topics  . Smoking status: Never Smoker  . Smokeless tobacco: Never Used  . Alcohol use Yes     Comment: 10/13/2015 "might have a drink q blue moon"  . Drug use: Yes     Comment: 10/13/2015 "I've tried alot; I don't have any habits"  . Sexual activity: Not Asked   Other Topics Concern  . None   Social History Narrative  . None   Past Surgical History:  Procedure Laterality Date  . CARDIAC CATHETERIZATION N/A 10/13/2015   Procedure: Left Heart Cath and Coronary Angiography;  Surgeon: Corky CraftsJayadeep S Varanasi, MD;  [facility];  Service: Cardiovascular;  Laterality: N/A;  . CARDIAC CATHETERIZATION N/A 10/13/2015   Procedure: Coronary Stent Intervention;  Surgeon: Corky CraftsJayadeep S Varanasi, MD;  [facility];  Service: Cardiovascular;  Laterality: N/A;  . CARDIAC CATHETERIZATION N/A 10/13/2015   Procedure: Intravascular Ultrasound/IVUS;  Surgeon: Corky CraftsJayadeep S Varanasi, MD;  [facility];  Service: Cardiovascular;  Laterality: N/A;  . CORONARY ANGIOPLASTY WITH STENT PLACEMENT  10/13/2015  . I&D EXTREMITY Right 09/19/2015   Procedure: IRRIGATION AND DEBRIDEMENT ANKLE LACERATIONS POSSIBLE TENDON REPAIR;  Surgeon: Cammy CopaScott Gregory Dean, MD;  Location: [facility] OR;  Service: Orthopedics;  Laterality: Right;  . I&D EXTREMITY Right 10/14/2015   Procedure: IRRIGATION AND DEBRIDEMENT EXTREMITY/Right Ankle;  Surgeon:  Cammy Copa, MD;  Location: [facility] OR;  Service: Orthopedics;  Laterality: Right;   Past Medical History:  Diagnosis Date  . Arthritis    "hips, knees, back" (10/13/2015)  . Bipolar disorder (HCC)   . CHF (congestive heart failure) (HCC)   . Coronary artery disease   . Depression   . DVT (deep venous thrombosis) (HCC) 09/2015   right  . Heart murmur   . High cholesterol   . Hypertension   . Myocardial infarction 10/12/2015 X 2-3  . Rectal myiasis   . Schizophrenia (HCC)   . Sciatica    BP (!)  139/92   Pulse 95   SpO2 96%   Opioid Risk Score:   Fall Risk Score:  `1  Depression screen PHQ 2/9  Depression screen [facility] 2/9 12/30/2015 10/20/2015  Decreased Interest 0 2  Down, Depressed, Hopeless 1 3  PHQ - 2 Score 1 5  Altered sleeping - 2  Tired, decreased energy - 1  Change in appetite - 2  Feeling bad or failure about yourself  - 3  Trouble concentrating - 1  Moving slowly or fidgety/restless - 0  Suicidal thoughts - 3  PHQ-9 Score - 17   Review of Systems  HENT: Negative.   Eyes: Negative.   Respiratory: Negative.   Cardiovascular: Negative.   Gastrointestinal: Negative.   Endocrine: Negative.   Genitourinary: Negative.   Musculoskeletal: Positive for arthralgias, back pain and gait problem.  Skin: Negative.   Allergic/Immunologic: Negative.   Neurological: Positive for weakness.  Hematological: Negative.   Psychiatric/Behavioral: Negative.   All other systems reviewed and are negative.     Objective:   Physical Exam Gen: NAD. Vital signs reviewed HENT: Normocephalic, Atraumatic Eyes: EOMI. No discharge.  Cardio: RRR. No JVD.  Pulm: B/l clear to auscultation.  Effort normal  Abd: Soft, BS+ MSK:  Gait antalgic.   TTP right ankle.   No edema.   Pes planus on right  Neuro:   Sensation diminished to light touch right foot  Strength  5/5 in all LE myotomes Skin: Warm and Dry. Surgical scars right foot.    Assessment & Plan:  46 y/o male with pmh of depression, CHF, schizophrenia, bipolar disorder, HTN, DVT, arthritis, CAD, cocaine abuse, achilles tendon rupture repair presents for follow up of right ankle pain.   1. Right ankle pain  Xray ankle reviewed showing soft tissue injury  Cont ice/heat  UDS reviewed, inconsistent with multiple controlled substances without a prescription  Will d/c Robaxin due to lack of benefit  Will increase Gabapentin 600 TID  Cont Voltaren gel   Pt to follow up on bracing for foot   Narcotics not appropriate given  history and inconsistent UDS

## 2016-04-18 ENCOUNTER — Encounter: Payer: Self-pay | Admitting: Internal Medicine

## 2016-04-18 ENCOUNTER — Ambulatory Visit: Payer: Self-pay | Attending: Internal Medicine | Admitting: Internal Medicine

## 2016-04-18 VITALS — BP 137/91 | HR 63 | Temp 97.5°F | Resp 16 | Wt 210.4 lb

## 2016-04-18 DIAGNOSIS — Z9861 Coronary angioplasty status: Secondary | ICD-10-CM

## 2016-04-18 DIAGNOSIS — J309 Allergic rhinitis, unspecified: Secondary | ICD-10-CM | POA: Insufficient documentation

## 2016-04-18 DIAGNOSIS — I10 Essential (primary) hypertension: Secondary | ICD-10-CM

## 2016-04-18 DIAGNOSIS — I2510 Atherosclerotic heart disease of native coronary artery without angina pectoris: Secondary | ICD-10-CM | POA: Insufficient documentation

## 2016-04-18 DIAGNOSIS — R0981 Nasal congestion: Secondary | ICD-10-CM | POA: Insufficient documentation

## 2016-04-18 DIAGNOSIS — Z125 Encounter for screening for malignant neoplasm of prostate: Secondary | ICD-10-CM | POA: Insufficient documentation

## 2016-04-18 DIAGNOSIS — Z79899 Other long term (current) drug therapy: Secondary | ICD-10-CM | POA: Insufficient documentation

## 2016-04-18 DIAGNOSIS — L299 Pruritus, unspecified: Secondary | ICD-10-CM | POA: Insufficient documentation

## 2016-04-18 DIAGNOSIS — Z888 Allergy status to other drugs, medicaments and biological substances status: Secondary | ICD-10-CM | POA: Insufficient documentation

## 2016-04-18 DIAGNOSIS — I509 Heart failure, unspecified: Secondary | ICD-10-CM | POA: Insufficient documentation

## 2016-04-18 DIAGNOSIS — F209 Schizophrenia, unspecified: Secondary | ICD-10-CM | POA: Insufficient documentation

## 2016-04-18 DIAGNOSIS — R0602 Shortness of breath: Secondary | ICD-10-CM | POA: Insufficient documentation

## 2016-04-18 DIAGNOSIS — F319 Bipolar disorder, unspecified: Secondary | ICD-10-CM | POA: Insufficient documentation

## 2016-04-18 DIAGNOSIS — E7800 Pure hypercholesterolemia, unspecified: Secondary | ICD-10-CM | POA: Insufficient documentation

## 2016-04-18 DIAGNOSIS — I110 Hypertensive heart disease with heart failure: Secondary | ICD-10-CM | POA: Insufficient documentation

## 2016-04-18 DIAGNOSIS — I252 Old myocardial infarction: Secondary | ICD-10-CM | POA: Insufficient documentation

## 2016-04-18 DIAGNOSIS — Z7982 Long term (current) use of aspirin: Secondary | ICD-10-CM | POA: Insufficient documentation

## 2016-04-18 DIAGNOSIS — Z7902 Long term (current) use of antithrombotics/antiplatelets: Secondary | ICD-10-CM | POA: Insufficient documentation

## 2016-04-18 LAB — BASIC METABOLIC PANEL WITH GFR
BUN: 19 mg/dL (ref 7–25)
CALCIUM: 9.7 mg/dL (ref 8.6–10.3)
CO2: 29 mmol/L (ref 20–31)
Chloride: 104 mmol/L (ref 98–110)
Creat: 1.2 mg/dL (ref 0.60–1.35)
GFR, EST AFRICAN AMERICAN: 83 mL/min (ref >=60)
GFR, EST NON AFRICAN AMERICAN: 72 mL/min (ref >=60)
Glucose, Bld: 91 mg/dL (ref 65–99)
Potassium: 5 mmol/L (ref 3.5–5.3)
SODIUM: 139 mmol/L (ref 135–146)

## 2016-04-18 LAB — PSA: PSA: 0.4 ng/mL (ref <=4.0)

## 2016-04-18 MED ORDER — ASPIRIN EC 81 MG PO TBEC: 81.0000 mg | DELAYED_RELEASE_TABLET | Freq: Every day | ORAL | 3 refills | Status: DC

## 2016-04-18 MED ORDER — CLOPIDOGREL BISULFATE 75 MG PO TABS: 75.0000 mg | ORAL_TABLET | Freq: Every day | ORAL | 3 refills | Status: DC

## 2016-04-18 MED ORDER — SALINE SPRAY 0.65 % NA SOLN: 1.0000 | NASAL | 3 refills | Status: DC | PRN

## 2016-04-18 MED ORDER — METOPROLOL SUCCINATE ER 25 MG PO TB24: 12.5000 mg | ORAL_TABLET | Freq: Every day | ORAL | 3 refills | Status: DC

## 2016-04-18 MED ORDER — ALBUTEROL SULFATE HFA 108 (90 BASE) MCG/ACT IN AERS: 2.0000 | INHALATION_SPRAY | Freq: Four times a day (QID) | RESPIRATORY_TRACT | 2 refills | Status: DC | PRN

## 2016-04-18 MED ORDER — LORATADINE 10 MG PO TABS: 10.0000 mg | ORAL_TABLET | Freq: Every day | ORAL | 11 refills | Status: DC

## 2016-04-18 MED ORDER — FLUTICASONE PROPIONATE 50 MCG/ACT NA SUSP: 2.0000 | Freq: Every day | NASAL | 6 refills | Status: DC

## 2016-04-18 MED ORDER — ALBUTEROL SULFATE HFA 108 (90 BASE) MCG/ACT IN AERS
2.0000 | INHALATION_SPRAY | Freq: Four times a day (QID) | RESPIRATORY_TRACT | 2 refills | Status: DC | PRN
Start: 2016-04-18 — End: 2016-06-20

## 2016-04-18 MED FILL — CLOPIDOGREL 75 MG TABLET: 75 | 30 days supply | Qty: 30 | Fill #0

## 2016-04-18 MED FILL — VENTOLIN HFA 90 MCG INHALER: 108 (90 BAS | 25 days supply | Qty: 18 | Fill #0

## 2016-04-18 MED FILL — METOPROLOL SUCC ER 25 MG TA: 25 | 30 days supply | Qty: 15 | Fill #0

## 2016-04-18 MED FILL — FLUTICASONE PROP 50 MCG SPR: 50 | 30 days supply | Qty: 16 | Fill #0

## 2016-04-18 NOTE — Progress Notes (Signed)
Brett Salinas, is a 46 y.o. male  [id number]  [id number]  DOB - 09/08/1970  Chief Complaint  Patient presents with  . Follow-up        Subjective:   Brett Salinas is a 46 y.o. male here today for a follow up visit., last seen 11/17, w/ hx of cad and htn, currently states taking all his meds. Had bad flu-like illness back in Feb, now slowly this improving. However, currently staying in shelter, but his roommate smokes, which patient states is making his breathing worse. More nasal congestion and c/o of bilat pruritis in eyes.  Wakes up sometimes w/ red eyes.  Does not smoke tob.  Currently he is seeing PMR as well for his right ankle, which they are working on getting foot brace for him.  Patient has No headache, No chest pain, No abdominal pain - No Nausea, No new weakness tingling or numbness, No Cough - SOB.  No problems updated.  ALLERGIES: Allergies  Allergen Reactions  . 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 Heparin Itching    Full-body itching inc throat  . Pork-Derived Products Itching    Burning/itching    PAST MEDICAL HISTORY: Past Medical History:  Diagnosis Date  . Arthritis    "hips, knees, back" (10/13/2015)  . Bipolar disorder (HCC)   . CHF (congestive heart failure) (HCC)   . Coronary artery disease   . Depression   . DVT (deep venous thrombosis) (HCC) 09/2015   right  . Heart murmur   . High cholesterol   . Hypertension   . Myocardial infarction 10/12/2015 X 2-3  . Rectal myiasis   . Schizophrenia (HCC)   . Sciatica     MEDICATIONS AT HOME: Prior to Admission medications   Medication Sig Start Date End Date Taking? Authorizing Provider  albuterol (PROVENTIL HFA;VENTOLIN HFA) 108 (90 Base) MCG/ACT inhaler Inhale 2 puffs into the lungs every 6 (six) hours as needed for wheezing or shortness of breath. 04/18/16   Pete Glatterawn T Ajai Harville, MD  aspirin EC 81 MG tablet Take 1 tablet (81 mg total) by mouth daily. 04/18/16   Pete Glatterawn T Caleigha Zale, MD    clopidogrel (PLAVIX) 75 MG tablet Take 1 tablet (75 mg total) by mouth daily. 04/18/16   Pete Glatterawn T Tahiri Shareef, MD  diclofenac sodium (VOLTAREN) 1 % GEL Apply 2 g topically 4 (four) times daily. 04/06/16   Ankit Karis JubaAnil Patel, MD  fluticasone (FLONASE) 50 MCG/ACT nasal spray Place 2 sprays into both nostrils daily. 04/18/16   Pete Glatterawn T Taye Cato, MD  gabapentin (NEURONTIN) 600 MG tablet Take 1 tablet (600 mg total) by mouth 3 (three) times daily. 04/06/16 05/06/16  Ankit Karis JubaAnil Patel, MD  loratadine (CLARITIN) 10 MG tablet Take 1 tablet (10 mg total) by mouth daily. 04/18/16   Pete Glatterawn T Breonna Gafford, MD  metoprolol succinate (TOPROL XL) 25 MG 24 hr tablet Take 0.5 tablets (12.5 mg total) by mouth daily. 04/18/16   Pete Glatterawn T Titilayo Hagans, MD  nitroGLYCERIN (NITROSTAT) 0.4 MG SL tablet Place 1 tablet (0.4 mg total) under the tongue every 5 (five) minutes x 3 doses as needed for chest pain. 10/15/15   Cammy CopaScott Gregory Dean, MD  sodium chloride (OCEAN) 0.65 % SOLN nasal spray Place 1 spray into the nose as needed for congestion. 04/18/16   Pete Glatterawn T Vung Kush, MD     Objective:   Vitals:   04/18/16 1118  BP: (!) 137/91  Pulse: 63  Resp: 16  Temp: 97.5 F (36.4 C)  TempSrc: Oral  SpO2: 99%  Weight: 210 lb 6.4 oz (95.4 kg)    Exam General appearance : Awake, alert, not in any distress. Speech Clear. Not toxic looking HEENT: Atraumatic and Normocephalic, pupils equally reactive to light. No conjunctivitis noted. Dry blood behind his left ear noted from recent shaving. bilat nares w/ some erythema/bogginess. bilat TMs clear. Neck: supple, no JVD. No cervical lymphadenopathy.  Chest:Good air entry bilaterally, no added sounds. CVS: S1 S2 regular, no murmurs/gallups or rubs. Abdomen: Bowel sounds active, Non tender and not distended with no gaurding, rigidity or rebound. Extremities: B/L Lower Ext shows no edema, both legs are warm to touch Neurology: Awake alert, and oriented X 3, CN II-XII grossly intact, Non focal Skin:No  Rash  Data Review Lab Results  Component Value Date   HGBA1C 5.6 10/12/2015    Depression screen [facility] 2/9 04/18/2016 12/30/2015 10/20/2015  Decreased Interest 1 0 2  Down, Depressed, Hopeless 2 1 3   PHQ - 2 Score 3 1 5   Altered sleeping 1 - 2  Tired, decreased energy 3 - 1  Change in appetite 2 - 2  Feeling bad or failure about yourself  1 - 3  Trouble concentrating 2 - 1  Moving slowly or fidgety/restless 0 - 0  Suicidal thoughts 0 - 3  PHQ-9 Score 12 - 17      Assessment & Plan   1. Essential hypertension Decent controlled, encouraged to take all his meds as prescribed. Low salt diet encouraged - renewed  toprol xl 12.5 qd, - BASIC METABOLIC PANEL WITH GFR  2. CAD -S/P PCI LAD/DES 10/13/15 Continue asa  81 and plavix qd  3. SOB (shortness of breath) - no prior tob hx, +2nd hand smoke. - Pulmonary function test; Future - prn albuterol mdi for now - recd trying to switch roommates, which his current facility is working on.  4. Prostate cancer screening - PSA  5. Nasal congestion, allergic rhinitis - trial flonase nasal spray and claritin qd - recd humidified air if able,      Patient have been counseled extensively about nutrition and exercise  Return in about 3 months (around 07/19/2016), or if symptoms worsen or fail to improve.  The patient was given clear instructions to go to ER or return to medical center if symptoms don't improve, worsen or new problems develop. The patient verbalized understanding. The patient was told to call to get lab results if they haven't heard anything in the next week.   This note has been created with Education officer, environmental. Any transcriptional errors are unintentional.   Pete Glatter, MD, MBA/MHA [facility] and [facility] [place], [place] [phone]   04/18/2016, 12:20 PM

## 2016-04-18 NOTE — Patient Instructions (Addendum)
Shortness of Breath, Adult Shortness of breath means you have trouble breathing. Your lungs are organs for breathing. Follow these instructions at home: Pay attention to any changes in your symptoms. Take these actions to help with your condition:  Do not smoke. Smoking can cause shortness of breath. If you need help to quit smoking, ask your doctor.  Avoid things that can make it harder to breathe, such as:  Mold.  Dust.  Air pollution.  Chemical smells.  Things that can cause allergy symptoms (allergens), if you have allergies.  Keep your living space clean and free of mold and dust.  Rest as needed. Slowly return to your usual activities.  Take over-the-counter and prescription medicines, including oxygen and inhaled medicines, only as told by your doctor.  Keep all follow-up visits as told by your doctor. This is important. Contact a doctor if:  Your condition does not get better as soon as expected.  You have a hard time doing your normal activities, even after you rest.  You have new symptoms. Get help right away if:  You have trouble breathing when you are resting.  You feel light-headed or you faint.  You have a cough that is not helped by medicines.  You cough up blood.  You have pain with breathing.  You have pain in your chest, arms, shoulders, or belly (abdomen).  You have a fever.  You cannot walk up stairs.  You cannot exercise the way you normally do. This information is not intended to replace advice given to you by your health care provider. Make sure you discuss any questions you have with your health care provider. Document Released: 07/06/2007 Document Revised: 02/04/2016 Document Reviewed: 02/04/2016 Elsevier Interactive Patient Education  2017 Elsevier Inc.  -  Nasal Allergies Nasal allergies are a reaction to allergens in the air. Allergens are tiny specks (particles) in the air that cause your body to have an allergic reaction. Nasal  allergies are not passed from person to person (contagious). They cannot be cured, but they can be controlled. Common causes of nasal allergies include:  Pollen from grasses, trees, and weeds.  House dust mites.  Pet dander.  Mold. Follow these instructions at home:  Avoid the allergen that is causing your symptoms, if you can.  Keep windows closed. If possible, use air conditioning when there is a lot of pollen in the air.  Do not use fans in your home.  Do not hang clothes outside to dry.  Wear sunglasses to keep pollen out of your eyes.  Wash your hands right away after you touch household pets.  Take over-the-counter and prescription medicines only as told by your doctor.  Keep all follow-up visits as told by your doctor. This is important. Contact a doctor if:  You have a fever.  You have a cough that does not go away (is persistent).  You start to make whistling sounds when you breathe (wheeze).  Your symptoms do not get better with treatment.  You have thick fluid coming from your nose.  You start to have nosebleeds. Get help right away if:  Your tongue or your lips are swollen.  You have trouble breathing.  You feel light-headed or you feel like you are going to pass out (faint).  You have cold sweats. This information is not intended to replace advice given to you by your health care provider. Make sure you discuss any questions you have with your health care provider. Document Released: 05/19/2010 Document Revised:  06/25/2015 Document Reviewed: 07/30/2014 Elsevier Interactive Patient Education  2017 [employer]vier Inc.  -  Allergic Conjunctivitis A clear membrane (conjunctiva) covers the white part of your eye and the inner surface of your eyelid. Allergic conjunctivitis happens when this membrane has inflammation. This is caused by allergies. Common causes of allergic reactions (allergens)include: Outdoor allergens, such as: Pollen. Grass and  weeds. Mold spores. Indoor allergens, such as: Dust. Smoke. Mold. Pet dander. Animal hair. This condition can make your eye red or pink. It can also make your eye feel itchy. This condition cannot be spread from one person to another person (is not contagious). Follow these instructions at home: Try not to be around things that you are allergic to. Take or apply over-the-counter and prescription medicines only as told by your doctor. These include any eye drops. Place a cool, clean washcloth on your eye for 10-20 minutes. Do this 3-4 times a day. Do not touch or rub your eyes. Do not wear contact lenses until the inflammation is gone. Wear glasses instead. Do not wear eye makeup until the inflammation is gone. Keep all follow-up visits as told by your doctor. This is important. Contact a doctor if: Your symptoms get worse. Your symptoms do not get better with treatment. You have mild eye pain. You are sensitive to light, You have spots or blisters on your eyes. You have pus coming from your eye. You have a fever. Get help right away if: You have redness, swelling, or other symptoms in only one eye. Your vision is blurry. You have vision changes. You have very bad eye pain. Summary Allergic conjunctivitis is caused by allergies. It can make your eye red or pink, and it can make your eye feel itchy. This condition cannot be spread from one person to another person (is not contagious). Try not to be around things that you are allergic to. Take or apply over-the-counter and prescription medicines only as told by your doctor. These include any eye drops. Contact your doctor if your symptoms get worse or they do not get better with treatment. This information is not intended to replace advice given to you by your health care provider. Make sure you discuss any questions you have with your health care provider. Document Released: 07/07/2009 Document Revised: 09/11/2015 Document  Reviewed: 09/11/2015 Elsevier Interactive Patient Education  2017 [employer]

## 2016-04-28 ENCOUNTER — Telehealth: Payer: Self-pay

## 2016-04-28 NOTE — Telephone Encounter (Signed)
Contacted pt to go over lab results pt didn't answer lvm asking pt to give me a cal at her earliest convenience   If pt calls back please give results: Prostate cancer screening negative, kidney function nml.

## 2016-05-11 ENCOUNTER — Ambulatory Visit: Payer: Self-pay | Attending: Internal Medicine

## 2016-05-12 ENCOUNTER — Ambulatory Visit: Payer: Self-pay

## 2016-05-16 ENCOUNTER — Encounter: Payer: Self-pay | Admitting: Internal Medicine

## 2016-05-16 ENCOUNTER — Ambulatory Visit: Payer: Self-pay | Attending: Internal Medicine | Admitting: Internal Medicine

## 2016-05-16 VITALS — BP 145/102 | HR 62 | Temp 97.6°F | Resp 16 | Wt 215.6 lb

## 2016-05-16 DIAGNOSIS — F209 Schizophrenia, unspecified: Secondary | ICD-10-CM | POA: Insufficient documentation

## 2016-05-16 DIAGNOSIS — M1990 Unspecified osteoarthritis, unspecified site: Secondary | ICD-10-CM | POA: Insufficient documentation

## 2016-05-16 DIAGNOSIS — F319 Bipolar disorder, unspecified: Secondary | ICD-10-CM | POA: Insufficient documentation

## 2016-05-16 DIAGNOSIS — I110 Hypertensive heart disease with heart failure: Secondary | ICD-10-CM | POA: Insufficient documentation

## 2016-05-16 DIAGNOSIS — I252 Old myocardial infarction: Secondary | ICD-10-CM | POA: Insufficient documentation

## 2016-05-16 DIAGNOSIS — Z7982 Long term (current) use of aspirin: Secondary | ICD-10-CM | POA: Insufficient documentation

## 2016-05-16 DIAGNOSIS — M25561 Pain in right knee: Secondary | ICD-10-CM | POA: Insufficient documentation

## 2016-05-16 DIAGNOSIS — E7800 Pure hypercholesterolemia, unspecified: Secondary | ICD-10-CM | POA: Insufficient documentation

## 2016-05-16 DIAGNOSIS — I10 Essential (primary) hypertension: Secondary | ICD-10-CM

## 2016-05-16 DIAGNOSIS — Z888 Allergy status to other drugs, medicaments and biological substances status: Secondary | ICD-10-CM | POA: Insufficient documentation

## 2016-05-16 DIAGNOSIS — I2510 Atherosclerotic heart disease of native coronary artery without angina pectoris: Secondary | ICD-10-CM | POA: Insufficient documentation

## 2016-05-16 DIAGNOSIS — I509 Heart failure, unspecified: Secondary | ICD-10-CM | POA: Insufficient documentation

## 2016-05-16 DIAGNOSIS — F1410 Cocaine abuse, uncomplicated: Secondary | ICD-10-CM | POA: Insufficient documentation

## 2016-05-16 DIAGNOSIS — S86011A Strain of right Achilles tendon, initial encounter: Secondary | ICD-10-CM | POA: Insufficient documentation

## 2016-05-16 DIAGNOSIS — X58XXXA Exposure to other specified factors, initial encounter: Secondary | ICD-10-CM | POA: Insufficient documentation

## 2016-05-16 DIAGNOSIS — Z79899 Other long term (current) drug therapy: Secondary | ICD-10-CM | POA: Insufficient documentation

## 2016-05-16 MED ORDER — GABAPENTIN 600 MG PO TABS: 600.0000 mg | ORAL_TABLET | Freq: Three times a day (TID) | ORAL | 1 refills | Status: DC

## 2016-05-16 MED ORDER — DICLOFENAC SODIUM 1 % TD GEL: 2.0000 g | Freq: Four times a day (QID) | TRANSDERMAL | 1 refills | Status: DC

## 2016-05-16 MED FILL — VOLTAREN 1% GEL: 1 | 12 days supply | Qty: 100 | Fill #0

## 2016-05-16 MED FILL — GABAPENTIN 600 MG TABLET: 600 | 30 days supply | Qty: 90 | Fill #0

## 2016-05-16 NOTE — Progress Notes (Signed)
Brett Salinas, is a 46 y.o. male  ZOX:[id number]  [id number]  DOB - 26-Jun-1970  No chief complaint on file.       Subjective:   Brett Salinas is a 46 y.o. male here today for a follow up visit, last seen 04/18/16, w/ pmhx  pmh/psh of depression, CHF, schizophrenia, bipolar disorder, HTN, DVT, arthritis, CAD, cocaine abuse, right achilles tendon rupture repair 10/2014, follows w/ PMR for bracing of right ankle pain.  Here today for c/o of mild right knee pains due to fact has not had right ankle brace yet. So, putting more stress on foot, and pain radiates up his knee to his lower back. Denies urinenary incontinence, pain about 6/10 currently. Ran out of voltarin gel/neurontin. Notes he had some mild left shoulder/elbow pain for able 1 wk, but resolved few days ago.  Patient has No headache, No chest pain, No abdominal pain - No Nausea, No new weakness tingling or numbness, No Cough - SOB.  No problems updated.  ALLERGIES: Allergies  Allergen Reactions  . 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 Heparin Itching    Full-body itching inc throat  . Pork-Derived Products Itching    Burning/itching    PAST MEDICAL HISTORY: Past Medical History:  Diagnosis Date  . Arthritis    "hips, knees, back" (10/13/2015)  . Bipolar disorder (HCC)   . CHF (congestive heart failure) (HCC)   . Coronary artery disease   . Depression   . DVT (deep venous thrombosis) (HCC) 09/2015   right  . Heart murmur   . High cholesterol   . Hypertension   . Myocardial infarction 10/12/2015 X 2-3  . Rectal myiasis   . Schizophrenia (HCC)   . Sciatica     MEDICATIONS AT HOME: Prior to Admission medications   Medication Sig Start Date End Date Taking? Authorizing Provider  albuterol (PROVENTIL HFA;VENTOLIN HFA) 108 (90 Base) MCG/ACT inhaler Inhale 2 puffs into the lungs every 6 (six) hours as needed for wheezing or shortness of breath. 04/18/16   Pete Glatter, MD  aspirin EC 81 MG tablet Take 1  tablet (81 mg total) by mouth daily. 04/18/16   Pete Glatter, MD  clopidogrel (PLAVIX) 75 MG tablet Take 1 tablet (75 mg total) by mouth daily. 04/18/16   Pete Glatter, MD  diclofenac sodium (VOLTAREN) 1 % GEL Apply 2 g topically 4 (four) times daily. 04/06/16   Ankit Karis Juba, MD  fluticasone (FLONASE) 50 MCG/ACT nasal spray Place 2 sprays into both nostrils daily. 04/18/16   Pete Glatter, MD  gabapentin (NEURONTIN) 600 MG tablet Take 1 tablet (600 mg total) by mouth 3 (three) times daily. 04/06/16 05/06/16  Ankit Karis Juba, MD  loratadine (CLARITIN) 10 MG tablet Take 1 tablet (10 mg total) by mouth daily. 04/18/16   Pete Glatter, MD  metoprolol succinate (TOPROL XL) 25 MG 24 hr tablet Take 0.5 tablets (12.5 mg total) by mouth daily. 04/18/16   Pete Glatter, MD  nitroGLYCERIN (NITROSTAT) 0.4 MG SL tablet Place 1 tablet (0.4 mg total) under the tongue every 5 (five) minutes x 3 doses as needed for chest pain. 10/15/15   Cammy Copa, MD  sodium chloride (OCEAN) 0.65 % SOLN nasal spray Place 1 spray into the nose as needed for congestion. 04/18/16   Pete Glatter, MD     Objective:   Vitals:   05/16/16 0954  BP: (!) 145/102  Pulse: 62  Resp: 16  Temp: 97.6 F (  36.4 C)  TempSrc: Oral  SpO2: 98%  Weight: 215 lb 9.6 oz (97.8 kg)    Exam General appearance : Awake, alert, not in any distress. Speech Clear. Not toxic looking, tall AAM, ambulating w/o difficulty. Pleasant. HEENT: Atraumatic and Normocephalic, pupils equally reactive to light. Neck: supple, no JVD.  Chest:Good air entry bilaterally, no added sounds. CVS: S1 S2 regular, no murmurs/gallups or rubs. Abdomen: Bowel sounds active, Non tender and not distended with no gaurding, rigidity or rebound. Extremities: B/L Lower Ext shows no edema, both legs are warm to touch. rom bilat knees intact. Mild ttp right lower back region. No knee edema/induration/warmth noted on bilat knee exams., rom intact bilat knees. No  pain on flexion/extension/palpation of right knee. Neurology: Awake alert, and oriented X 3, CN II-XII grossly intact, Non focal Skin:No Rash  Data Review Lab Results  Component Value Date   HGBA1C 5.6 10/12/2015    Depression screen [facility] 2/9 04/18/2016 12/30/2015 10/20/2015  Decreased Interest 1 0 2  Down, Depressed, Hopeless PHQ - 2 Score Altered sleeping 1 - 2  Tired, decreased energy 3 - 1  Change in appetite 2 - 2  Feeling bad or failure about yourself  1 - 3  Trouble concentrating 2 - 1  Moving slowly or fidgety/restless 0 - 0  Suicidal thoughts 0 - 3  PHQ-9 Score 12 - 17      Assessment & Plan   1. Essential hypertension - elevated, advised to take meds as prescribed. -low salt diet - May be elavated w/ pain. - advised to f/u w/ cards at his next appt.  2. Right knee pain, no obvious effusions noted. - may be extension of pain from right ankle pain,  h/o right achilles tendon rupture repair 10/2014, follows w/ PMR, still waiting for  Brace  of right ankle. - renewed voltarin gel and gabapentin for now, advised RICE, info provided - recd f/u w/ PMR - will chk rf/ana/gout labs to r/o other causes of knee pain.     Patient have been counseled extensively about nutrition and exercise  Return in about 3 months (around 08/15/2016), or if symptoms worsen or fail to improve.  The patient was given clear instructions to go to ER or return to medical center if symptoms don't improve, worsen or new problems develop. The patient verbalized understanding. The patient was told to call to get lab results if they haven't heard anything in the next week.   This note has been created with Education officer, environmental. Any transcriptional errors are unintentional.   Pete Glatter, MD, MBA/MHA [facility] and [facility] [place], [place] [phone]   05/16/2016, 9:53 AM

## 2016-05-16 NOTE — Patient Instructions (Signed)
RICE for Routine Care of Injuries Many injuries can be cared for using rest, ice, compression, and elevation (RICE therapy). Using RICE therapy can help to lessen pain and swelling. It can help your body to heal. Rest  Reduce your normal activities and avoid using the injured part of your body. You can go back to your normal activities when you feel okay and your doctor says it is okay. Ice  Do not put ice on your bare skin.  Put ice in a plastic bag.  Place a towel between your skin and the bag.  Leave the ice on for 20 minutes, 2-3 times a day. Do this for as long as told by your doctor. Compression  Compression means putting pressure on the injured area. This can be done with an elastic bandage. If an elastic bandage has been applied:  Remove and reapply the bandage every 3-4 hours or as told by your doctor.  Make sure the bandage is not wrapped too tight. Wrap the bandage more loosely if part of your body beyond the bandage is blue, swollen, cold, painful, or loses feeling (numb).  See your doctor if the bandage seems to make your problems worse. Elevation  Elevation means keeping the injured area raised. Raise the injured area above your heart or the center of your chest if you can. When should I get help? You should get help if:  You keep having pain and swelling.  Your symptoms get worse. Get help right away if: You should get help right away if:  You have sudden bad pain at or below the area of your injury.  You have redness or more swelling around your injury.  You have tingling or numbness at or below the injury that does not go away when you take off the bandage. This information is not intended to replace advice given to you by your health care provider. Make sure you discuss any questions you have with your health care provider. Document Released: 07/06/2007 Document Revised: 12/15/2015 Document Reviewed: 12/25/2013 Elsevier Interactive Patient Education  2017  [employer] -  Knee Pain, Adult Many things can cause knee pain. The pain often goes away on its own with time and rest. If the pain does not go away, tests may be done to find out what is causing the pain. Follow these instructions at home: Activity   Rest your knee.  Do not do things that cause pain.  Avoid activities where both feet leave the ground at the same time (high-impact activities). Examples are running, jumping rope, and doing jumping jacks. General instructions   Take medicines only as told by your doctor.  Raise (elevate) your knee when you are resting. Make sure your knee is higher than your heart.  Sleep with a pillow under your knee.  If told, put ice on the knee:  Put ice in a plastic bag.  Place a towel between your skin and the bag.  Leave the ice on for 20 minutes, 2-3 times a day.  Ask your doctor if you should wear an elastic knee support.  Lose weight if you are overweight. Being overweight can make your knee hurt more.  Do not use any tobacco products. These include cigarettes, chewing tobacco, or electronic cigarettes. If you need help quitting, ask your doctor. Smoking may slow down healing. Contact a doctor if:  The pain does not stop.  The pain changes or gets worse.  You have a fever along with knee pain.  Your  knee gives out or locks up.  Your knee swells, and becomes worse. Get help right away if:  Your knee feels warm.  You cannot move your knee.  You have very bad knee pain.  You have chest pain.  You have trouble breathing. Summary  Many things can cause knee pain. The pain often goes away on its own with time and rest.  Avoid activities that put stress on your knee. These include running and jumping rope.  Get help right away if you cannot move your knee, or if your knee feels warm, or if you have trouble breathing. This information is not intended to replace advice given to you by your health care provider. Make  sure you discuss any questions you have with your health care provider. Document Released: 04/15/2008 Document Revised: 01/12/2016 Document Reviewed: 01/12/2016 Elsevier Interactive Patient Education  2017 [employer].

## 2016-05-18 LAB — ANA,IFA RA DIAG PNL W/RFLX TIT/PATN
ANA Titer 1: NEGATIVE
Cyclic Citrullin Peptide Ab: 11 units (ref 0–19)

## 2016-05-18 LAB — URIC ACID: URIC ACID: 5.1 mg/dL (ref 3.7–8.6)

## 2016-05-20 ENCOUNTER — Telehealth: Payer: Self-pay

## 2016-05-20 NOTE — Telephone Encounter (Signed)
Contacted pt to go over lab results pt didn't answer and was unable to lvm.   If pt calls back please give results: All labs negative, does not have lupus, rheumatoid arthritis or gout. Likely knee pain from overuse syndrome. Continue RICE, rest/ice/compression w/ heat/elevate

## 2016-05-24 MED FILL — METOPROLOL SUCC ER 25 MG TA: 25 | 30 days supply | Qty: 15 | Fill #1

## 2016-05-24 MED FILL — CLOPIDOGREL 75 MG TABLET: 75 | 30 days supply | Qty: 30 | Fill #1

## 2016-05-24 MED FILL — VENTOLIN HFA 90 MCG INHALER: 108 (90 BAS | 25 days supply | Qty: 18 | Fill #1

## 2016-05-24 MED FILL — FLUTICASONE PROP 50 MCG SPR: 50 | 30 days supply | Qty: 16 | Fill #1

## 2016-05-27 ENCOUNTER — Encounter: Payer: Self-pay | Admitting: Physical Medicine & Rehabilitation

## 2016-06-03 ENCOUNTER — Encounter
Payer: No Typology Code available for payment source | Attending: Physical Medicine & Rehabilitation | Admitting: Physical Medicine & Rehabilitation

## 2016-06-03 ENCOUNTER — Ambulatory Visit (HOSPITAL_COMMUNITY)
Admission: RE | Admit: 2016-06-03 | Discharge: 2016-06-03 | Disposition: A | Discharge status: Home / Self Care | Payer: Self-pay | Source: Ambulatory Visit | Attending: Physical Medicine & Rehabilitation | Admitting: Physical Medicine & Rehabilitation

## 2016-06-03 ENCOUNTER — Encounter: Payer: Self-pay | Admitting: Physical Medicine & Rehabilitation

## 2016-06-03 VITALS — BP 117/80 | HR 87

## 2016-06-03 DIAGNOSIS — I252 Old myocardial infarction: Secondary | ICD-10-CM | POA: Insufficient documentation

## 2016-06-03 DIAGNOSIS — G8929 Other chronic pain: Secondary | ICD-10-CM | POA: Insufficient documentation

## 2016-06-03 DIAGNOSIS — F1410 Cocaine abuse, uncomplicated: Secondary | ICD-10-CM | POA: Insufficient documentation

## 2016-06-03 DIAGNOSIS — E7800 Pure hypercholesterolemia, unspecified: Secondary | ICD-10-CM | POA: Insufficient documentation

## 2016-06-03 DIAGNOSIS — I110 Hypertensive heart disease with heart failure: Secondary | ICD-10-CM | POA: Insufficient documentation

## 2016-06-03 DIAGNOSIS — R531 Weakness: Secondary | ICD-10-CM | POA: Insufficient documentation

## 2016-06-03 DIAGNOSIS — M25561 Pain in right knee: Secondary | ICD-10-CM

## 2016-06-03 DIAGNOSIS — I509 Heart failure, unspecified: Secondary | ICD-10-CM | POA: Insufficient documentation

## 2016-06-03 DIAGNOSIS — M25461 Effusion, right knee: Secondary | ICD-10-CM | POA: Insufficient documentation

## 2016-06-03 DIAGNOSIS — Z9889 Other specified postprocedural states: Secondary | ICD-10-CM | POA: Insufficient documentation

## 2016-06-03 DIAGNOSIS — M25571 Pain in right ankle and joints of right foot: Secondary | ICD-10-CM | POA: Insufficient documentation

## 2016-06-03 DIAGNOSIS — F319 Bipolar disorder, unspecified: Secondary | ICD-10-CM | POA: Insufficient documentation

## 2016-06-03 DIAGNOSIS — I2510 Atherosclerotic heart disease of native coronary artery without angina pectoris: Secondary | ICD-10-CM | POA: Insufficient documentation

## 2016-06-03 DIAGNOSIS — Z590 Homelessness: Secondary | ICD-10-CM | POA: Insufficient documentation

## 2016-06-03 DIAGNOSIS — M1990 Unspecified osteoarthritis, unspecified site: Secondary | ICD-10-CM | POA: Insufficient documentation

## 2016-06-03 DIAGNOSIS — M1711 Unilateral primary osteoarthritis, right knee: Secondary | ICD-10-CM | POA: Insufficient documentation

## 2016-06-03 DIAGNOSIS — F209 Schizophrenia, unspecified: Secondary | ICD-10-CM | POA: Insufficient documentation

## 2016-06-03 NOTE — Progress Notes (Addendum)
Subjective:    Patient ID: Brett Salinas, male    DOB: 03/19/1970, 46 y.o.   MRN: [id number]  HPI 46 y/o male with pmh/psh of depression, CHF, schizophrenia, bipolar disorder, HTN, DVT, arthritis, CAD, cocaine abuse, right achilles tendon rupture repair 10/2014 presents for follow up for pain.  Initially stated: Pain started 09/2014 after accident.  Pain is better at times than others.  Achy, burning pain.  Biofreeze helps.  Pressure on it makes it worse.  Radiates to distal foot.  Constant.  Associated numbness, tingling.  Compression socks help.  Tramadol does not help.  Pt states he has had several falls in the snow/ice.  He states he stumbles at times.  Pain inhibits mobility.  Pt is currently homeless.   Last clinic visit 04/06/16.  Today, pt presents with right knee pain.  He is still awaiting bracing for his right ankle.  His knee pain has been present since 2005 when he tore some ligaments.  He was supposed to have surgery, but never had it.  Pain mostly medial knee.  Getting worse. Bending makes it worse.  Denies falls. Biofreeze helps.     Pain Inventory Average Pain 7 Pain Right Now 9 My pain is .  In the last 24 hours, has pain interfered with the following? General activity 6 Relation with others 3 Enjoyment of life 9 What TIME of day is your pain at its worst? night Sleep (in general) Poor  Pain is worse with: walking and bending Pain improves with: pacing activities and medication Relief from Meds: 8  Mobility walk without assistance ability to climb steps?  yes do you drive?  no use a wheelchair  Function disabled: date disabled 2001 I need assistance with the following:  household duties  Neuro/Psych weakness tingling trouble walking spasms loss of taste or smell  Prior Studies Any changes since last visit?  no  Physicians involved in your care Any changes since last visit?  no   No family history on file. Social History   Social History  .  Marital status: Single    Spouse name: N/A  . Number of children: N/A  . Years of education: N/A   Social History Main Topics  . Smoking status: Never Smoker  . Smokeless tobacco: Never Used  . Alcohol use Yes     Comment: 10/13/2015 "might have a drink q blue moon"  . Drug use: Yes     Comment: 10/13/2015 "I've tried alot; I don't have any habits"  . Sexual activity: Not on file   Other Topics Concern  . Not on file   Social History Narrative  . No narrative on file   Past Surgical History:  Procedure Laterality Date  . CARDIAC CATHETERIZATION N/A 10/13/2015   Procedure: Left Heart Cath and Coronary Angiography;  Surgeon: Corky CraftsJayadeep S Varanasi, MD;  [facility];  Service: Cardiovascular;  Laterality: N/A;  . CARDIAC CATHETERIZATION N/A 10/13/2015   Procedure: Coronary Stent Intervention;  Surgeon: Corky CraftsJayadeep S Varanasi, MD;  [facility];  Service: Cardiovascular;  Laterality: N/A;  . CARDIAC CATHETERIZATION N/A 10/13/2015   Procedure: Intravascular Ultrasound/IVUS;  Surgeon: Corky CraftsJayadeep S Varanasi, MD;  [facility];  Service: Cardiovascular;  Laterality: N/A;  . CORONARY ANGIOPLASTY WITH STENT PLACEMENT  10/13/2015  . I&D EXTREMITY Right 09/19/2015   Procedure: IRRIGATION AND DEBRIDEMENT ANKLE LACERATIONS POSSIBLE TENDON REPAIR;  Surgeon: Cammy CopaScott Gregory Dean, MD;  Location: [facility] OR;  Service: Orthopedics;  Laterality:  Right;  . I&D EXTREMITY Right 10/14/2015   Procedure: IRRIGATION AND DEBRIDEMENT EXTREMITY/Right Ankle;  Surgeon: Cammy Copa, MD;  Location: [facility] OR;  Service: Orthopedics;  Laterality: Right;   Past Medical History:  Diagnosis Date  . Arthritis    "hips, knees, back" (10/13/2015)  . Bipolar disorder (HCC)   . CHF (congestive heart failure) (HCC)   . Coronary artery disease   . Depression   . DVT (deep venous thrombosis) (HCC) 09/2015   right  . Heart murmur   . High cholesterol   . Hypertension   . Myocardial  infarction (HCC) 10/12/2015 X 2-3  . Rectal myiasis   . Schizophrenia (HCC)   . Sciatica    There were no vitals taken for this visit.  Opioid Risk Score:   Fall Risk Score:  `1  Depression screen PHQ 2/9  Depression screen [facility] 2/9 04/18/2016 12/30/2015 10/20/2015  Decreased Interest 1 0 2  Down, Depressed, Hopeless 2 1 3   PHQ - 2 Score 3 1 5   Altered sleeping 1 - 2  Tired, decreased energy 3 - 1  Change in appetite 2 - 2  Feeling bad or failure about yourself  1 - 3  Trouble concentrating 2 - 1  Moving slowly or fidgety/restless 0 - 0  Suicidal thoughts 0 - 3  PHQ-9 Score 12 - 17   Review of Systems  HENT: Negative.   Eyes: Negative.   Respiratory: Negative.   Cardiovascular: Negative.   Gastrointestinal: Negative.   Endocrine: Negative.   Genitourinary: Negative.   Musculoskeletal: Positive for arthralgias, back pain, gait problem and joint swelling.  Skin: Negative.   Allergic/Immunologic: Negative.   Neurological: Positive for weakness.  Hematological: Negative.   Psychiatric/Behavioral: Negative.   All other systems reviewed and are negative.     Objective:   Physical Exam Gen: NAD. Vital signs reviewed HENT: Normocephalic, Atraumatic Eyes: EOMI. No discharge.  Cardio: RRR. No JVD.  Pulm: B/l clear to auscultation.  Effort normal Abd: Soft, BS+ MSK:  Gait antalgic.   +TTP right medial knee.   +edema.  Neuro:   Sensation intact to light touch right knee  Strength  5/5 in all LE myotomes Skin: Warm and Dry. Surgical scars right foot.    Assessment & Plan:  46 y/o male with pmh of depression, CHF, schizophrenia, bipolar disorder, HTN, DVT, arthritis, CAD, cocaine abuse, achilles tendon rupture repair presents for follow up of for pain.   1. Right knee pain  Cont Biofreeze  Cont knee brace during excessive activity  Cont Voltaren gel   Will order xray of knee and consider injection

## 2016-06-16 ENCOUNTER — Encounter: Payer: Self-pay | Admitting: Internal Medicine

## 2016-06-17 ENCOUNTER — Encounter: Payer: Self-pay | Admitting: Physical Medicine & Rehabilitation

## 2016-06-17 ENCOUNTER — Encounter (HOSPITAL_BASED_OUTPATIENT_CLINIC_OR_DEPARTMENT_OTHER): Payer: No Typology Code available for payment source | Admitting: Physical Medicine & Rehabilitation

## 2016-06-17 VITALS — BP 125/79 | HR 78

## 2016-06-17 DIAGNOSIS — M1711 Unilateral primary osteoarthritis, right knee: Secondary | ICD-10-CM

## 2016-06-17 NOTE — Progress Notes (Signed)
Knee injection   Indication: Knee pain not relieved by medication management and other conservative care.  Informed consent was obtained after describing risks and benefits of the procedure with the patient, this includes bleeding, bruising, infection and medication side effects. The patient wishes to proceed and has given written consent. Patient was placed in a seated position. The right knee was marked and prepped with betadine in the anteromedial area. Vapocoolant spray was applied. A 22-gauge 2 inch needle was inserted into the joint space. No fluid draw back with empty syringe. Medication syringe applied. After negative draw back for blood, a solution containing 1 mL of 6 mg per ML celestone and 4 mL of 1% lidocaine was injected. A band aid was applied. The patient tolerated the procedure well. Post procedure instructions were given.

## 2016-06-17 NOTE — Progress Notes (Signed)
Medication was drawn up 4cc lidocaine 1% and 1cc celestone into a 5cc syringe, verified patient identification using name and birthrate and obtained verbal consent for steroid knee injection of right knee

## 2016-06-20 ENCOUNTER — Telehealth: Payer: Self-pay | Admitting: Internal Medicine

## 2016-06-20 ENCOUNTER — Other Ambulatory Visit: Payer: Self-pay | Admitting: Internal Medicine

## 2016-06-20 DIAGNOSIS — M79671 Pain in right foot: Secondary | ICD-10-CM

## 2016-06-20 DIAGNOSIS — M79672 Pain in left foot: Principal | ICD-10-CM

## 2016-06-20 MED FILL — CLOPIDOGREL 75 MG TABLET: 75 | 30 days supply | Qty: 30 | Fill #2

## 2016-06-20 MED FILL — GABAPENTIN 600 MG TABLET: 600 | 30 days supply | Qty: 90 | Fill #1

## 2016-06-20 MED FILL — METOPROLOL SUCC ER 25 MG TA: 25 | 30 days supply | Qty: 15 | Fill #2

## 2016-06-20 MED FILL — FLUTICASONE PROP 50 MCG SPR: 50 | 30 days supply | Qty: 16 | Fill #2

## 2016-06-20 MED FILL — VOLTAREN 1% GEL: 1 | 12 days supply | Qty: 100 | Fill #0

## 2016-06-20 MED FILL — VENTOLIN HFA 90 MCG INHALER: 108 (90 BAS | 25 days supply | Qty: 18 | Fill #2

## 2016-06-20 NOTE — Telephone Encounter (Signed)
Will forward to pcp

## 2016-06-20 NOTE — Telephone Encounter (Signed)
Patient called the office asking to speak with PCP in regards to getting a referral to see an Orthopedist for ortho shoes. Patient has CAFA and OC. Please follow up.  Thank you.

## 2016-06-20 NOTE — Telephone Encounter (Signed)
I put in both referral for Ortho and podiatry - not certain who he will see first, but both could eval for orthotics. thanks

## 2016-06-22 ENCOUNTER — Ambulatory Visit (INDEPENDENT_AMBULATORY_CARE_PROVIDER_SITE_OTHER): Payer: No Typology Code available for payment source | Admitting: Cardiology

## 2016-06-22 ENCOUNTER — Encounter: Payer: Self-pay | Admitting: Cardiology

## 2016-06-22 VITALS — BP 122/62 | HR 60 | Ht 79.0 in | Wt 211.0 lb

## 2016-06-22 DIAGNOSIS — Z9861 Coronary angioplasty status: Secondary | ICD-10-CM

## 2016-06-22 DIAGNOSIS — I2510 Atherosclerotic heart disease of native coronary artery without angina pectoris: Secondary | ICD-10-CM

## 2016-06-22 DIAGNOSIS — I10 Essential (primary) hypertension: Secondary | ICD-10-CM

## 2016-06-22 DIAGNOSIS — E782 Mixed hyperlipidemia: Secondary | ICD-10-CM

## 2016-06-22 MED ORDER — ATORVASTATIN CALCIUM 20 MG PO TABS: 20.0000 mg | ORAL_TABLET | Freq: Every day | ORAL | 2 refills | Status: DC

## 2016-06-22 MED FILL — ATORVASTATIN 20 MG TABLET: 20 | 30 days supply | Qty: 30 | Fill #0

## 2016-06-22 NOTE — Progress Notes (Signed)
06/22/2016 Brett Salinas   September 25, 1970  [id number]  Primary Physician Pete Glatter, MD Primary Cardiologist: Dr. Delton See    Reason for Visit/CC: Post ER follow up.  HPI:  Brett Salinas recently established are with our practice during recent hospitalization. In summary, he is a 46 year old schizophrenic, bipolar AA male who was recently released from prison. He was living in a hotel when a porcelain sink fell on his right heel, severing his Rt achilles tendon. He underwent I&D and tendon repair 09/19/15. He was discharged 10/01/15. Post op he did not fill his Xarelto Rx but took ASA. He presented to the ED 10/12/15 with exertional chest pain and SOB that had been going on for two days. His drug screen was positive for cocaine. His Troponin came back positive- pk 0.18. Cath done 10/13/15 revealed a 90% proximal LAD and no other significant CAD. He underwent successful PCI with DES. Echo revealed normal left ventricular EF at 60-65%. Mild MR was also noted. He was placed on dual antiplatelet therapy with aspirin and Brilinta as well as carvedilol and atorvastatin. I evaluated him for post hospital f/u on 10/29/15. He was doing well from a cardiac standpoint.  12/14/2015 - this is a 2 week follow-up, the patient was seen by Boyce Medici 2 weeks ago where he admitted he is not taking his medications. Today he states again that he doesn't believe in medications and hasn't taken any of them. He is coming accompanied by an orthotist priest who he describes as his family. He states that he hasn't been experiencing any chest pain or shortness of breath or palpitations. However he states that on 12/07/2015 he experienced a syncopal episode while in the bathroom. He denies any chest pain or palpitations prior to her episode. He was taken to the ER for ACS was ruled out. He states that he has been disconnected to his family he has multiple children and grandchildren. He reinforces that he doesn't believe that his  body needs medicines and also states that those medicines are costing his church a lot of money.  03/22/2016 - the patient is coming after 3 months, he has been doing okay until last week when he developed fever chills and cough and was diagnosed with pneumonia. He was started on Augmentin by his primary care physician. He doesn't have any more fever or chills but continues to cough and feels short of breath. He denies any lower extremity edema. His chest pain only when he coughs. No dizziness or syncope.  06/22/16 - 3 months follow up, he takes his medications most of the time, he denies chest pain, DOE,  no LE edema, orthopnea or PND. He stays active. He worries about his friend who has medical problems.     Current Meds  Medication Sig  . aspirin EC 81 MG tablet Take 1 tablet (81 mg total) by mouth daily.  . clopidogrel (PLAVIX) 75 MG tablet Take 1 tablet (75 mg total) by mouth daily.  . diclofenac sodium (VOLTAREN) 1 % GEL Apply 2 g topically 4 (four) times daily.  . fluticasone (FLONASE) 50 MCG/ACT nasal spray Place 2 sprays into both nostrils daily.  Marland Kitchen gabapentin (NEURONTIN) 600 MG tablet Take 1 tablet (600 mg total) by mouth 3 (three) times daily.  Marland Kitchen loratadine (CLARITIN) 10 MG tablet Take 1 tablet (10 mg total) by mouth daily.  . metoprolol succinate (TOPROL XL) 25 MG 24 hr tablet Take 0.5 tablets (12.5 mg total) by mouth daily.  . nitroGLYCERIN (NITROSTAT)  0.4 MG SL tablet Place 1 tablet (0.4 mg total) under the tongue every 5 (five) minutes x 3 doses as needed for chest pain.  Marland Kitchen OLANZapine (ZYPREXA) 10 MG tablet Take 10 mg by mouth at bedtime.  . VENTOLIN HFA 108 (90 Base) MCG/ACT inhaler INHALE 2 PUFFS INTO THE LUNGS EVERY 6 (SIX) HOURS AS NEEDED FOR WHEEZING OR SHORTNESS OF BREATH.   Allergies  Allergen Reactions  . 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 Heparin Itching    Full-body itching inc throat  . Pork-Derived Products Itching    Burning/itching   Past Medical History:    Diagnosis Date  . Arthritis    "hips, knees, back" (10/13/2015)  . Bipolar disorder (HCC)   . CHF (congestive heart failure) (HCC)   . Coronary artery disease   . Depression   . DVT (deep venous thrombosis) (HCC) 09/2015   right  . Heart murmur   . High cholesterol   . Hypertension   . Myocardial infarction (HCC) 10/12/2015 X 2-3  . Rectal myiasis   . Schizophrenia (HCC)   . Sciatica    No family history on file. Past Surgical History:  Procedure Laterality Date  . CARDIAC CATHETERIZATION N/A 10/13/2015   Procedure: Left Heart Cath and Coronary Angiography;  Surgeon: Corky Crafts, MD;  [facility] INVASIVE CV LAB;  Service: Cardiovascular;  Laterality: N/A;  . CARDIAC CATHETERIZATION N/A 10/13/2015   Procedure: Coronary Stent Intervention;  Surgeon: Corky Crafts, MD;  Location: [facility] INVASIVE CV LAB;  Service: Cardiovascular;  Laterality: N/A;  . CARDIAC CATHETERIZATION N/A 10/13/2015   Procedure: Intravascular Ultrasound/IVUS;  Surgeon: Corky Crafts, MD;  Location: [facility] INVASIVE CV LAB;  Service: Cardiovascular;  Laterality: N/A;  . CORONARY ANGIOPLASTY WITH STENT PLACEMENT  10/13/2015  . I&D EXTREMITY Right 09/19/2015   Procedure: IRRIGATION AND DEBRIDEMENT ANKLE LACERATIONS POSSIBLE TENDON REPAIR;  Surgeon: Cammy Copa, MD;  Location: [facility] OR;  Service: Orthopedics;  Laterality: Right;  . I&D EXTREMITY Right 10/14/2015   Procedure: IRRIGATION AND DEBRIDEMENT EXTREMITY/Right Ankle;  Surgeon: Cammy Copa, MD;  Location: [facility] OR;  Service: Orthopedics;  Laterality: Right;   Social History   Social History  . Marital status: Single    Spouse name: N/A  . Number of children: N/A  . Years of education: N/A   Occupational History  . Not on file.   Social History Main Topics  . Smoking status: Never Smoker  . Smokeless tobacco: Never Used  . Alcohol use Yes     Comment: 10/13/2015 "might have a drink q blue moon"  . Drug use: Yes     Comment: 10/13/2015  "I've tried alot; I don't have any habits"  . Sexual activity: Not on file   Other Topics Concern  . Not on file   Social History Narrative  . No narrative on file     Review of Systems: General: negative for chills, fever, night sweats or weight changes.  Cardiovascular: negative for chest pain, dyspnea on exertion, edema, orthopnea, palpitations, paroxysmal nocturnal dyspnea or shortness of breath Dermatological: negative for rash Respiratory: negative for cough or wheezing Urologic: negative for hematuria Abdominal: negative for nausea, vomiting, diarrhea, bright red blood per rectum, melena, or hematemesis Neurologic: negative for visual changes, syncope, or dizziness All other systems reviewed and are otherwise negative except as noted above.   Physical Exam:  Blood pressure 122/62, pulse 60, height 6\' 7"  (2.007 m), weight 211 lb (95.7 kg), SpO2 97 %.  General  appearance: alert, cooperative, no distress Neck: no carotid bruit and no JVD Lungs: Crackles and rales at the left lower base more than right. Heart: regular rhythm, tachy rate, S1, S2 normal, no murmur, click, rub or gallop Extremities: no LEE Pulses: 2+ and symmetric Skin: warm and dry Neurologic:  Grossly normal  EKG: Performed in the ER on 12/07/2015 shows sinus rhythm with ventricular rate 96 bpm, repolarization abnormalities in anterior leads.   ASSESSMENT AND PLAN:   1. CAD: s/p  NSTEMI with 90% LAD stenosis, S/p PCI + DES on 10/2015.  He was previously noncompliant however currently taking metoprolol aspirin and Plavix. He is now asymptomatic. He agrees to start taking atorvastatin 20 mg po daily.  2. HTN: Currently controlled after restarting metoprolol will continue.  3. HLD: He agrees to start taking atorvastatin 20 mg po daily.  PLAN  Keep f/u with Dr. Delton SeeNelson in 6 months.   Tobias AlexanderKatarina Latisia Hilaire , MD 06/22/2016 11:39 AM

## 2016-06-22 NOTE — Patient Instructions (Signed)
Medication Instructions:   START TAKING ATORVASTATIN 20 MG ONCE DAILY       Follow-Up:  Your physician wants you to follow-up in: 6 MONTHS WITH DR Johnell ComingsNELSON You will receive a reminder letter in the mail two months in advance. If you don't receive a letter, please call our office to schedule the follow-up appointment.        If you need a refill on your cardiac medications before your next appointment, please call your pharmacy.

## 2016-06-30 ENCOUNTER — Ambulatory Visit (INDEPENDENT_AMBULATORY_CARE_PROVIDER_SITE_OTHER): Payer: Self-pay | Admitting: Orthopedic Surgery

## 2016-06-30 ENCOUNTER — Encounter (INDEPENDENT_AMBULATORY_CARE_PROVIDER_SITE_OTHER): Payer: Self-pay | Admitting: Orthopedic Surgery

## 2016-06-30 VITALS — Ht 79.0 in | Wt 208.0 lb

## 2016-06-30 DIAGNOSIS — M7661 Achilles tendinitis, right leg: Secondary | ICD-10-CM

## 2016-06-30 NOTE — Progress Notes (Signed)
Office Visit Note   Patient: Brett Salinas           Date of Birth: [birth year]           MRN: [id number] Visit Date: 06/30/2016              Requested by: Pete Glatter, MD No address on file PCP: Pete Glatter, MD  Chief Complaint  Patient presents with  . Right Knee - Pain  . Left Knee - Pain  . Right Foot - Pain  . Left Foot - Pain  . Joint Pain    patient states everything hurts all the time       HPI:  Patient presents in follow-up for Achilles tendinitis on the right. Patient states that he is been having knee pain worse in the right and left he states that Dr. Allena Katz in pain management has injected cortisone in his knee and he will be scheduled for hyaluronic acid injection for the right knee. Patient states he wears a brace is not wearing it today and then he cannot wear these in regular shoes.  Assessment & Plan: Visit Diagnoses:  1. Achilles tendinitis, right leg     Plan: Recommended patient get a pair of new balance walking sneaker at [employer] as well as sole orthotics to help unload his midfoot. Discussed that he should have sufficient room in the new balance walking sneaker to wear his posterior AFO. Patient states that he wanted [facility] to pay for his shoes and inserts and I discussed the risks are things that are not covered by his medical assistance plan and shoes and inserts are not covered. Also recommended that he get the compression stockings to help decrease the venous swelling.  Follow-Up Instructions: Return if symptoms worsen or fail to improve.   Ortho Exam  Patient is alert, oriented, no adenopathy, well-dressed, normal affect, normal respiratory effort. Examination patient has a normal gait. Examination he does have some keloiding of the scar over the Achilles reconstruction recommended scar massage daily. Patient has no tenderness to palpation along the Achilles tendon no pain with range of motion of the ankle. Patient has good plantarflexion  and dorsiflexion strength he has good pulses. There is no redness no cellulitis no signs of infection.  Imaging: No results found.  Labs: Lab Results  Component Value Date   HGBA1C 5.6 10/12/2015   LABURIC 5.1 05/16/2016   REPTSTATUS 10/19/2015 FINAL 10/14/2015   GRAMSTAIN  10/14/2015    RARE WBC PRESENT, PREDOMINANTLY PMN NO ORGANISMS SEEN    CULT RARE ENTEROBACTER SPECIES NO ANAEROBES ISOLATED  10/14/2015   LABORGA ENTEROBACTER SPECIES 10/14/2015    Orders:  No orders of the defined types were placed in this encounter.  No orders of the defined types were placed in this encounter.    Procedures: No procedures performed  Clinical Data: No additional findings.  ROS:  All other systems negative, except as noted in the HPI. Review of Systems  Objective: Vital Signs: Ht 6\' 7"  (2.007 m)   Wt 208 lb (94.3 kg)   BMI 23.43 kg/m   Specialty Comments:  No specialty comments available.  PMFS History: Patient Active Problem List   Diagnosis Date Noted  . Essential hypertension 11/30/2015  . CAD -S/P PCI LAD/DES 10/13/15 10/14/2015  . Wound infection- for I&D achilles wound 10/14/15 10/14/2015  . Cocaine abuse-drug sceen positive 10/14/2015  . Schizoaffective disorder, bipolar type (HCC) 10/14/2015  . Infection 10/14/2015  . NSTEMI (non-ST elevated  myocardial infarction) (HCC)   . Chest pain 10/12/2015  . Achilles tendon rupture-surgery 09/19/15 09/19/2015   Past Medical History:  Diagnosis Date  . Arthritis    "hips, knees, back" (10/13/2015)  . Bipolar disorder (HCC)   . CHF (congestive heart failure) (HCC)   . Coronary artery disease   . Depression   . DVT (deep venous thrombosis) (HCC) 09/2015   right  . Heart murmur   . High cholesterol   . Hypertension   . Myocardial infarction (HCC) 10/12/2015 X 2-3  . Rectal myiasis   . Schizophrenia (HCC)   . Sciatica     No family history on file.  Past Surgical History:  Procedure Laterality Date  . CARDIAC  CATHETERIZATION N/A 10/13/2015   Procedure: Left Heart Cath and Coronary Angiography;  Surgeon: Corky CraftsJayadeep S Varanasi, MD;  [facility];  Service: Cardiovascular;  Laterality: N/A;  . CARDIAC CATHETERIZATION N/A 10/13/2015   Procedure: Coronary Stent Intervention;  Surgeon: Corky CraftsJayadeep S Varanasi, MD;  [facility];  Service: Cardiovascular;  Laterality: N/A;  . CARDIAC CATHETERIZATION N/A 10/13/2015   Procedure: Intravascular Ultrasound/IVUS;  Surgeon: Corky CraftsJayadeep S Varanasi, MD;  [facility];  Service: Cardiovascular;  Laterality: N/A;  . CORONARY ANGIOPLASTY WITH STENT PLACEMENT  10/13/2015  . I&D EXTREMITY Right 09/19/2015   Procedure: IRRIGATION AND DEBRIDEMENT ANKLE LACERATIONS POSSIBLE TENDON REPAIR;  Surgeon: Cammy CopaScott Gregory Dean, MD;  Location: [facility] OR;  Service: Orthopedics;  Laterality: Right;  . I&D EXTREMITY Right 10/14/2015   Procedure: IRRIGATION AND DEBRIDEMENT EXTREMITY/Right Ankle;  Surgeon: Cammy CopaScott Gregory Dean, MD;  Location: [facility] OR;  Service: Orthopedics;  Laterality: Right;   Social History   Occupational History  . Not on file.   Social History Main Topics  . Smoking status: Never Smoker  . Smokeless tobacco: Never Used  . Alcohol use Yes     Comment: 10/13/2015 "might have a drink q blue moon"  . Drug use: Yes     Comment: 10/13/2015 "I've tried alot; I don't have any habits"  . Sexual activity: Not on file

## 2016-07-13 ENCOUNTER — Ambulatory Visit (INDEPENDENT_AMBULATORY_CARE_PROVIDER_SITE_OTHER): Payer: No Typology Code available for payment source | Admitting: Podiatry

## 2016-07-13 ENCOUNTER — Ambulatory Visit: Payer: No Typology Code available for payment source

## 2016-07-13 ENCOUNTER — Encounter: Payer: Self-pay | Admitting: Podiatry

## 2016-07-13 ENCOUNTER — Telehealth: Payer: Self-pay | Admitting: *Deleted

## 2016-07-13 DIAGNOSIS — M779 Enthesopathy, unspecified: Secondary | ICD-10-CM

## 2016-07-13 DIAGNOSIS — M79671 Pain in right foot: Secondary | ICD-10-CM

## 2016-07-13 DIAGNOSIS — M79672 Pain in left foot: Secondary | ICD-10-CM

## 2016-07-13 NOTE — Progress Notes (Signed)
   Subjective:    Patient ID: Brett Salinas, male    DOB: 06/30/1970, 46 y.o.   MRN: [id number]  HPI  Chief Complaint  Patient presents with  . Foot Pain    BL; back of heel pain that radiates to toes x 10 months. Pt ruptured right achilles and had sugery in August 2017, done by Dr.Dean at [facility].       Review of Systems     Objective:   Physical Exam        Assessment & Plan:

## 2016-07-13 NOTE — Telephone Encounter (Signed)
Pt states he will be a few minutes late, his ride is stuck in traffic.

## 2016-07-13 NOTE — Progress Notes (Signed)
Subjective:    Patient ID: Brett Salinas, male   DOB: 46 y.o.   MRN: [id number]   HPI patient presents with flatfeet and history of having Achilles tendon surgery bilateral    Review of Systems  All other systems reviewed and are negative.       Objective:  Physical Exam  Cardiovascular: Intact distal pulses.   Musculoskeletal: Normal range of motion.  Neurological: He is alert.  Skin: Skin is warm and dry.  Nursing note and vitals reviewed.  neurovascular status intact muscle strength adequate range of motion within normal limits with patient having no equinus and is noted to have scarring on the back of the right Achilles that was done by another physician and is moderately tender. Also noted to have depression of the arch bilateral     Assessment:  Inflammatory condition with moderate flatfoot deformity but does have history of brace right       Plan:    Encouraged him on importance of wearing his brace for the right and did go ahead today and dispensed him insoles to give him better support

## 2016-07-20 ENCOUNTER — Encounter: Payer: No Typology Code available for payment source | Admitting: Physical Medicine & Rehabilitation

## 2016-07-20 ENCOUNTER — Encounter: Payer: Self-pay | Admitting: Family Medicine

## 2016-07-20 ENCOUNTER — Ambulatory Visit: Payer: Self-pay | Attending: Family Medicine | Admitting: Family Medicine

## 2016-07-20 VITALS — BP 134/76 | HR 69 | Temp 98.3°F | Wt 208.0 lb

## 2016-07-20 DIAGNOSIS — H527 Unspecified disorder of refraction: Secondary | ICD-10-CM

## 2016-07-20 DIAGNOSIS — Z7982 Long term (current) use of aspirin: Secondary | ICD-10-CM | POA: Insufficient documentation

## 2016-07-20 DIAGNOSIS — I2510 Atherosclerotic heart disease of native coronary artery without angina pectoris: Secondary | ICD-10-CM | POA: Insufficient documentation

## 2016-07-20 DIAGNOSIS — F250 Schizoaffective disorder, bipolar type: Secondary | ICD-10-CM | POA: Insufficient documentation

## 2016-07-20 DIAGNOSIS — Z955 Presence of coronary angioplasty implant and graft: Secondary | ICD-10-CM | POA: Insufficient documentation

## 2016-07-20 DIAGNOSIS — I509 Heart failure, unspecified: Secondary | ICD-10-CM | POA: Insufficient documentation

## 2016-07-20 DIAGNOSIS — M25561 Pain in right knee: Secondary | ICD-10-CM | POA: Insufficient documentation

## 2016-07-20 DIAGNOSIS — M1990 Unspecified osteoarthritis, unspecified site: Secondary | ICD-10-CM | POA: Insufficient documentation

## 2016-07-20 DIAGNOSIS — I252 Old myocardial infarction: Secondary | ICD-10-CM | POA: Insufficient documentation

## 2016-07-20 DIAGNOSIS — G8929 Other chronic pain: Secondary | ICD-10-CM | POA: Insufficient documentation

## 2016-07-20 DIAGNOSIS — I110 Hypertensive heart disease with heart failure: Secondary | ICD-10-CM | POA: Insufficient documentation

## 2016-07-20 DIAGNOSIS — R062 Wheezing: Secondary | ICD-10-CM | POA: Insufficient documentation

## 2016-07-20 DIAGNOSIS — H6122 Impacted cerumen, left ear: Secondary | ICD-10-CM | POA: Insufficient documentation

## 2016-07-20 DIAGNOSIS — Z9861 Coronary angioplasty status: Secondary | ICD-10-CM

## 2016-07-20 DIAGNOSIS — Z7902 Long term (current) use of antithrombotics/antiplatelets: Secondary | ICD-10-CM | POA: Insufficient documentation

## 2016-07-20 DIAGNOSIS — E7800 Pure hypercholesterolemia, unspecified: Secondary | ICD-10-CM | POA: Insufficient documentation

## 2016-07-20 MED ORDER — CARBAMIDE PEROXIDE 6.5 % OT SOLN: 5.0000 [drp] | Freq: Two times a day (BID) | OTIC | 0 refills | Status: DC

## 2016-07-20 MED ORDER — METOPROLOL SUCCINATE ER 25 MG PO TB24: 12.5000 mg | ORAL_TABLET | Freq: Every day | ORAL | 3 refills | Status: DC

## 2016-07-20 MED FILL — EARWAX TREATMENT 6.5% DROPS: 6.5 | 22 days supply | Qty: 15 | Fill #0

## 2016-07-20 MED FILL — METOPROLOL SUCC ER 25 MG TA: 25 | 30 days supply | Qty: 15 | Fill #0

## 2016-07-20 NOTE — Progress Notes (Signed)
Subjective:  Patient ID: Brett Salinas, male    DOB: 1970/02/09  Age: 46 y.o. MRN: [id number]  CC: Here to establish care  HPI Brett Salinas is a 46 year old male with a history of schizoaffective disorder, coronary artery disease status post PCI with drug-eluting stent (currently on dual antiplatelet therapy with Plavix and aspirin), history of R Achilles tendon rupture status post repair who presents today to establish care with me. He was previously followed by Dr. Julien Nordmann who is no longer with the practice.  He has been compliant with his medications and denies chest pains or shortness of breath. He was last seen by cardiology last month at which time he received refills of his medications.  He receives mental health care [facility].  Today he complains of foreign body sensation in his right ear but denies pain. Denies nasal congestion, postnasal drip. He is unhappy that he will have to miss his appointment today with Dr. Allena Katz for management of his right knee pain as it is time for his appointment and he is still here in the clinic.  Past Medical History:  Diagnosis Date  . Arthritis    "hips, knees, back" (10/13/2015)  . Bipolar disorder (HCC)   . CHF (congestive heart failure) (HCC)   . Coronary artery disease   . Depression   . DVT (deep venous thrombosis) (HCC) 09/2015   right  . Heart murmur   . High cholesterol   . Hypertension   . Myocardial infarction (HCC) 10/12/2015 X 2-3  . Rectal myiasis   . Schizophrenia (HCC)   . Sciatica     Past Surgical History:  Procedure Laterality Date  . CARDIAC CATHETERIZATION N/A 10/13/2015   Procedure: Left Heart Cath and Coronary Angiography;  Surgeon: Corky Crafts, MD;  Location[facility] INVASIVE CV LAB;  Service: Cardiovascular;  Laterality: N/A;  . CARDIAC CATHETERIZATION N/A 10/13/2015   Procedure: Coronary Stent Intervention;  Surgeon: Corky Crafts, MD;  Location: [facility] INVASIVE CV LAB;  Service: Cardiovascular;   Laterality: N/A;  . CARDIAC CATHETERIZATION N/A 10/13/2015   Procedure: Intravascular Ultrasound/IVUS;  Surgeon: Corky Crafts, MD;  Location: [facility] INVASIVE CV LAB;  Service: Cardiovascular;  Laterality: N/A;  . CORONARY ANGIOPLASTY WITH STENT PLACEMENT  10/13/2015  . I&D EXTREMITY Right 09/19/2015   Procedure: IRRIGATION AND DEBRIDEMENT ANKLE LACERATIONS POSSIBLE TENDON REPAIR;  Surgeon: Cammy Copa, MD;  Location: [facility] OR;  Service: Orthopedics;  Laterality: Right;  . I&D EXTREMITY Right 10/14/2015   Procedure: IRRIGATION AND DEBRIDEMENT EXTREMITY/Right Ankle;  Surgeon: Cammy Copa, MD;  Location: [facility] OR;  Service: Orthopedics;  Laterality: Right;    Allergies  Allergen Reactions  . 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 Heparin Itching    Full-body itching inc throat  . Pork-Derived Products Itching    Burning/itching     Outpatient Medications Prior to Visit  Medication Sig Dispense Refill  . aspirin EC 81 MG tablet Take 1 tablet (81 mg total) by mouth daily. 90 tablet 3  . atorvastatin (LIPITOR) 20 MG tablet Take 1 tablet (20 mg total) by mouth daily. 90 tablet 2  . clopidogrel (PLAVIX) 75 MG tablet Take 1 tablet (75 mg total) by mouth daily. 90 tablet 3  . diclofenac sodium (VOLTAREN) 1 % GEL Apply 2 g topically 4 (four) times daily. 1 Tube 1  . fluticasone (FLONASE) 50 MCG/ACT nasal spray Place 2 sprays into both nostrils daily. 16 g 6  . gabapentin (NEURONTIN) 600 MG tablet Take 1 tablet (600  mg total) by mouth 3 (three) times daily. 90 tablet 1  . OLANZapine (ZYPREXA) 10 MG tablet Take 10 mg by mouth at bedtime.    . VENTOLIN HFA 108 (90 Base) MCG/ACT inhaler INHALE 2 PUFFS INTO THE LUNGS EVERY 6 (SIX) HOURS AS NEEDED FOR WHEEZING OR SHORTNESS OF BREATH. 18 g 2  . metoprolol succinate (TOPROL XL) 25 MG 24 hr tablet Take 0.5 tablets (12.5 mg total) by mouth daily. 45 tablet 3  . loratadine (CLARITIN) 10 MG tablet Take 1 tablet (10 mg total) by mouth daily. (Patient  not taking: Reported on 07/13/2016) 30 tablet 11  . nitroGLYCERIN (NITROSTAT) 0.4 MG SL tablet Place 1 tablet (0.4 mg total) under the tongue every 5 (five) minutes x 3 doses as needed for chest pain. (Patient not taking: Reported on 07/13/2016) 30 tablet 12   No facility-administered medications prior to visit.     ROS Review of Systems  Constitutional: Negative for activity change and appetite change.  HENT: Negative for sinus pressure and sore throat.   Eyes: Positive for visual disturbance.  Respiratory: Negative for cough, chest tightness and shortness of breath.   Cardiovascular: Negative for chest pain and leg swelling.  Gastrointestinal: Negative for abdominal distention, abdominal pain, constipation and diarrhea.  Endocrine: Negative.   Genitourinary: Negative for dysuria.  Musculoskeletal: Negative for joint swelling and myalgias.  Skin: Negative for rash.  Allergic/Immunologic: Negative.   Neurological: Negative for weakness, light-headedness and numbness.  Psychiatric/Behavioral: Negative for dysphoric mood and suicidal ideas.    Objective:  BP 134/76   Pulse 69   Temp 98.3 F (36.8 C) (Oral)   Wt 208 lb (94.3 kg)   SpO2 98%   BMI 23.43 kg/m   BP/Weight 07/20/2016 06/30/2016 06/22/2016  Systolic BP 134 - 122  Diastolic BP 76 - 62  Wt. (Lbs) 208 208 211  BMI 23.43 23.43 23.77      Physical Exam  Constitutional: He is oriented to person, place, and time. He appears well-developed and well-nourished.  HENT:  R ear- normal TM L ear- cerumen obscuring TM  Cardiovascular: Normal rate, normal heart sounds and intact distal pulses.   No murmur heard. Pulmonary/Chest: Effort normal and breath sounds normal. He has no wheezes. He has no rales. He exhibits no tenderness.  Abdominal: Soft. Bowel sounds are normal. He exhibits no distension and no mass. There is no tenderness.  Musculoskeletal: Normal range of motion. He exhibits tenderness (mild tenderness in R knee and  crepitus on ROM of both knees). He exhibits no edema.  Neurological: He is alert and oriented to person, place, and time.  Skin: Skin is warm and dry.  Psychiatric: He has a normal mood and affect.     Assessment & Plan:   1. Schizoaffective disorder, bipolar type (HCC) Continue Zyprexa [facility]  2. CAD -S/P PCI LAD/DES 10/13/15 Risk factor modification Continued while antiplatelet therapy with Plavix and aspirin  3. Chronic pain of right knee Followed by pain management-Dr. Allena KatzPatel  4. Impacted cerumen of left ear - carbamide peroxide (DEBROX) 6.5 % OTIC solution; Place 5 drops into the left ear 2 (two) times daily.  Dispense: 15 mL; Refill: 0  5. Refractive errors - Ambulatory referral to Ophthalmology   Meds ordered this encounter  Medications  . metoprolol succinate (TOPROL XL) 25 MG 24 hr tablet    Sig: Take 0.5 tablets (12.5 mg total) by mouth daily.    Dispense:  45 tablet  Refill:  3  . carbamide peroxide (DEBROX) 6.5 % OTIC solution    Sig: Place 5 drops into the left ear 2 (two) times daily.    Dispense:  15 mL    Refill:  0    Follow-up: Return in about 4 months (around 11/19/2016) for Follow-up of chronic medical conditions.   Jaclyn Shaggy MD

## 2016-08-12 ENCOUNTER — Encounter: Payer: Self-pay | Attending: Physical Medicine & Rehabilitation | Admitting: Physical Medicine & Rehabilitation

## 2016-08-12 ENCOUNTER — Encounter: Payer: Self-pay | Admitting: Physical Medicine & Rehabilitation

## 2016-08-12 VITALS — BP 143/84 | HR 77

## 2016-08-12 DIAGNOSIS — R531 Weakness: Secondary | ICD-10-CM | POA: Insufficient documentation

## 2016-08-12 DIAGNOSIS — I509 Heart failure, unspecified: Secondary | ICD-10-CM | POA: Insufficient documentation

## 2016-08-12 DIAGNOSIS — M25561 Pain in right knee: Secondary | ICD-10-CM

## 2016-08-12 DIAGNOSIS — I110 Hypertensive heart disease with heart failure: Secondary | ICD-10-CM | POA: Insufficient documentation

## 2016-08-12 DIAGNOSIS — S86011S Strain of right Achilles tendon, sequela: Secondary | ICD-10-CM

## 2016-08-12 DIAGNOSIS — M1990 Unspecified osteoarthritis, unspecified site: Secondary | ICD-10-CM | POA: Insufficient documentation

## 2016-08-12 DIAGNOSIS — F319 Bipolar disorder, unspecified: Secondary | ICD-10-CM | POA: Insufficient documentation

## 2016-08-12 DIAGNOSIS — E7800 Pure hypercholesterolemia, unspecified: Secondary | ICD-10-CM | POA: Insufficient documentation

## 2016-08-12 DIAGNOSIS — M1711 Unilateral primary osteoarthritis, right knee: Secondary | ICD-10-CM

## 2016-08-12 DIAGNOSIS — I2510 Atherosclerotic heart disease of native coronary artery without angina pectoris: Secondary | ICD-10-CM | POA: Insufficient documentation

## 2016-08-12 DIAGNOSIS — F209 Schizophrenia, unspecified: Secondary | ICD-10-CM | POA: Insufficient documentation

## 2016-08-12 DIAGNOSIS — M2141 Flat foot [pes planus] (acquired), right foot: Secondary | ICD-10-CM

## 2016-08-12 DIAGNOSIS — G8929 Other chronic pain: Secondary | ICD-10-CM

## 2016-08-12 DIAGNOSIS — Z590 Homelessness: Secondary | ICD-10-CM | POA: Insufficient documentation

## 2016-08-12 DIAGNOSIS — Z96652 Presence of left artificial knee joint: Secondary | ICD-10-CM

## 2016-08-12 DIAGNOSIS — F1410 Cocaine abuse, uncomplicated: Secondary | ICD-10-CM | POA: Insufficient documentation

## 2016-08-12 DIAGNOSIS — I252 Old myocardial infarction: Secondary | ICD-10-CM | POA: Insufficient documentation

## 2016-08-12 DIAGNOSIS — Z9889 Other specified postprocedural states: Secondary | ICD-10-CM | POA: Insufficient documentation

## 2016-08-12 DIAGNOSIS — M25571 Pain in right ankle and joints of right foot: Secondary | ICD-10-CM | POA: Insufficient documentation

## 2016-08-12 DIAGNOSIS — M25562 Pain in left knee: Secondary | ICD-10-CM

## 2016-08-12 MED ORDER — DICLOFENAC SODIUM 1 % TD GEL
2.0000 g | Freq: Four times a day (QID) | TRANSDERMAL | 1 refills | Status: DC
Start: 2016-08-12 — End: 2016-09-21

## 2016-08-12 NOTE — Progress Notes (Signed)
Subjective:    Patient ID: Brett Salinas, male    DOB: 08-07-1970, 46 y.o.   MRN: [id number]  HPI 46 y/o male with pmh/psh of depression, CHF, schizophrenia, bipolar disorder, HTN, DVT, arthritis, CAD, cocaine abuse, right achilles tendon rupture repair 10/2014 presents for follow up for pain.  Initially stated: Pain started 09/2014 after accident.  Pain is better at times than others.  Achy, burning pain.  Biofreeze helps.  Pressure on it makes it worse.  Radiates to distal foot.  Constant.  Associated numbness, tingling.  Compression socks help.  Tramadol does not help.  Pt states he has had several falls in the snow/ice.  He states he stumbles at times.  Pain inhibits mobility.  Pt is currently homeless.   Last clinic visit 06/03/16.  At that time, he had a knee injection.  He states it worked well for 3 weeks, but then started doing things he probably should not have and then started hurting again.  He did have xrays.  He occasionally uses brace for his right ankle.   Pain Inventory Average Pain 7 Pain Right Now 9 My pain is .  In the last 24 hours, has pain interfered with the following? General activity 7 Relation with others 6 Enjoyment of life 9 What TIME of day is your pain at its worst? night Sleep (in general) Poor  Pain is worse with: walking and bending Pain improves with: pacing activities and medication Relief from Meds: 8  Mobility walk without assistance ability to climb steps?  yes do you drive?  no use a wheelchair  Function disabled: date disabled 2001 I need assistance with the following:  household duties  Neuro/Psych weakness tingling trouble walking spasms loss of taste or smell  Prior Studies Any changes since last visit?  no  Physicians involved in your care Any changes since last visit?  no   No family history on file. Social History   Social History  . Marital status: Single    Spouse name: N/A  . Number of children: N/A  . Years of  education: N/A   Social History Main Topics  . Smoking status: Never Smoker  . Smokeless tobacco: Never Used  . Alcohol use Yes     Comment: 10/13/2015 "might have a drink q blue moon"  . Drug use: Yes     Comment: 10/13/2015 "I've tried alot; I don't have any habits"  . Sexual activity: Not Asked   Other Topics Concern  . None   Social History Narrative  . None   Past Surgical History:  Procedure Laterality Date  . CARDIAC CATHETERIZATION N/A 10/13/2015   Procedure: Left Heart Cath and Coronary Angiography;  Surgeon: Corky Crafts, MD;  Location: [facility] INVASIVE CV LAB;  Service: Cardiovascular;  Laterality: N/A;  . CARDIAC CATHETERIZATION N/A 10/13/2015   Procedure: Coronary Stent Intervention;  Surgeon: Corky Crafts, MD;  Location: [facility] INVASIVE CV LAB;  Service: Cardiovascular;  Laterality: N/A;  . CARDIAC CATHETERIZATION N/A 10/13/2015   Procedure: Intravascular Ultrasound/IVUS;  Surgeon: Corky Crafts, MD;  Location: [facility] INVASIVE CV LAB;  Service: Cardiovascular;  Laterality: N/A;  . CORONARY ANGIOPLASTY WITH STENT PLACEMENT  10/13/2015  . I&D EXTREMITY Right 09/19/2015   Procedure: IRRIGATION AND DEBRIDEMENT ANKLE LACERATIONS POSSIBLE TENDON REPAIR;  Surgeon: Cammy Copa, MD;  Location: [facility] OR;  Service: Orthopedics;  Laterality: Right;  . I&D EXTREMITY Right 10/14/2015   Procedure: IRRIGATION AND DEBRIDEMENT EXTREMITY/Right Ankle;  Surgeon: Cammy Copa, MD;  Location: [facility] OR;  Service: Orthopedics;  Laterality: Right;   Past Medical History:  Diagnosis Date  . Arthritis    "hips, knees, back" (10/13/2015)  . Bipolar disorder (HCC)   . CHF (congestive heart failure) (HCC)   . Coronary artery disease   . Depression   . DVT (deep venous thrombosis) (HCC) 09/2015   right  . Heart murmur   . High cholesterol   . Hypertension   . Myocardial infarction (HCC) 10/12/2015 X 2-3  . Rectal myiasis   . Schizophrenia (HCC)   . Sciatica    BP (!) 143/84    Pulse 77   SpO2 97%   Opioid Risk Score:   Fall Risk Score:  `1  Depression screen PHQ 2/9  Dep[facility] 07/20/2016 04/18/2016 12/30/2015 10/20/2015  Decreased Interest 0 1 0 2  Down, Depressed, Hopeless 1 2 1 3   PHQ - 2 Score 1 3 1 5   Altered sleeping 2 1 - 2  Tired, decreased energy 1 3 - 1  Change in appetite 0 2 - 2  Feeling bad or failure about yourself  1 1 - 3  Trouble concentrating 0 2 - 1  Moving slowly or fidgety/restless 0 0 - 0  Suicidal thoughts 0 0 - 3  PHQ-9 Score 5 12 - 17   Review of Systems  Constitutional: Positive for diaphoresis and unexpected weight change.  HENT: Negative.   Eyes: Negative.   Respiratory: Negative.   Cardiovascular: Negative.   Gastrointestinal: Negative.   Endocrine: Negative.   Genitourinary: Negative.   Musculoskeletal: Positive for arthralgias, back pain, gait problem and joint swelling.  Skin: Negative.   Allergic/Immunologic: Negative.   Neurological: Positive for weakness.  Hematological: Negative.   Psychiatric/Behavioral: Negative.   All other systems reviewed and are negative.     Objective:   Physical Exam Gen: NAD. Vital signs reviewed HENT: Normocephalic, Atraumatic Eyes: EOMI. No discharge.  Cardio: RRR. No JVD.  Pulm: B/l clear to auscultation.  Effort normal Abd: Soft, BS+ MSK:  Gait antalgic.   +mild TTP right >left medial knee.   Minimal edema right knee Neuro:   Sensation intact to light touch right knee  Strength  5/5 in all LE myotomes Skin: Warm and Dry. Surgical scars right foot.    Assessment & Plan:  46 y/o male with pmh of depression, CHF, schizophrenia, bipolar disorder, HTN, DVT, arthritis, CAD, cocaine abuse, achilles tendon rupture repair presents for follow up of for pain.   1. Right knee pain  Cont Biofreeze  Cont knee brace during excessive activity  Cont Voltaren gel   Xray reviewed, showing mild medial OA  Plan to inject Zilretta  2. Left knee pain  Xray of left knee  ordered  Plan to inject Zilretta  3. Right ankle pain             Xray ankle reviewed showing soft tissue injury             Cont ice/heat             UDS reviewed, inconsistent with multiple controlled substances without a prescription             Will d/c Robaxin due to lack of benefit  Cont Gabapentin 600 TID             Cont Voltaren gel   Cont bracing for foot              Narcotics not appropriate given  history and inconsistent UDS  4. Pes planus  Pt awaiting funds for shoes

## 2016-08-13 ENCOUNTER — Other Ambulatory Visit: Payer: Self-pay | Admitting: Family Medicine

## 2016-08-13 DIAGNOSIS — H6122 Impacted cerumen, left ear: Secondary | ICD-10-CM

## 2016-08-13 MED ORDER — CARBAMIDE PEROXIDE 6.5 % OT SOLN: 5.0000 [drp] | Freq: Two times a day (BID) | OTIC | 0 refills | Status: DC

## 2016-08-15 MED FILL — EARWAX TREATMENT 6.5% DROPS: 6.5 | 30 days supply | Qty: 15 | Fill #0

## 2016-08-18 ENCOUNTER — Ambulatory Visit (HOSPITAL_COMMUNITY)
Admission: RE | Admit: 2016-08-18 | Discharge: 2016-08-18 | Disposition: A | Discharge status: Home / Self Care | Payer: Self-pay | Source: Ambulatory Visit | Attending: Physical Medicine & Rehabilitation | Admitting: Physical Medicine & Rehabilitation

## 2016-08-18 DIAGNOSIS — G8929 Other chronic pain: Secondary | ICD-10-CM

## 2016-08-18 DIAGNOSIS — M1712 Unilateral primary osteoarthritis, left knee: Secondary | ICD-10-CM | POA: Insufficient documentation

## 2016-08-18 DIAGNOSIS — M25562 Pain in left knee: Secondary | ICD-10-CM

## 2016-08-18 MED FILL — ?CLOPIDOGREL 75 MG TABLET: 75 | 30 days supply | Qty: 30 | Fill #3

## 2016-08-18 MED FILL — ATORVASTATIN 20 MG TABLET: 20 | 30 days supply | Qty: 30 | Fill #1

## 2016-08-18 MED FILL — GABAPENTIN 600 MG TABLET: 600 | 30 days supply | Qty: 90 | Fill #0

## 2016-08-18 MED FILL — !VENTOLIN HFA INHALER: 108 (90 BAS | 25 days supply | Qty: 18 | Fill #0

## 2016-08-18 MED FILL — METOPROLOL SUCC ER 25 MG TA: 25 | 30 days supply | Qty: 15 | Fill #1

## 2016-08-18 MED FILL — VOLTAREN 1% GEL: 1 | 12 days supply | Qty: 100 | Fill #0

## 2016-08-18 MED FILL — FLUTICASONE PROP 50 MCG SPR: 50 | 30 days supply | Qty: 16 | Fill #3

## 2016-09-01 ENCOUNTER — Ambulatory Visit: Payer: Self-pay | Attending: Internal Medicine

## 2016-09-05 ENCOUNTER — Emergency Department (HOSPITAL_COMMUNITY)
Admission: EM | Admit: 2016-09-05 | Discharge: 2016-09-05 | Payer: Self-pay | Attending: Emergency Medicine | Admitting: Emergency Medicine

## 2016-09-05 DIAGNOSIS — I2510 Atherosclerotic heart disease of native coronary artery without angina pectoris: Secondary | ICD-10-CM | POA: Insufficient documentation

## 2016-09-05 DIAGNOSIS — Z86718 Personal history of other venous thrombosis and embolism: Secondary | ICD-10-CM | POA: Insufficient documentation

## 2016-09-05 DIAGNOSIS — Z008 Encounter for other general examination: Secondary | ICD-10-CM | POA: Insufficient documentation

## 2016-09-05 DIAGNOSIS — I252 Old myocardial infarction: Secondary | ICD-10-CM | POA: Insufficient documentation

## 2016-09-05 DIAGNOSIS — I10 Essential (primary) hypertension: Secondary | ICD-10-CM | POA: Insufficient documentation

## 2016-09-05 DIAGNOSIS — F1910 Other psychoactive substance abuse, uncomplicated: Secondary | ICD-10-CM

## 2016-09-05 DIAGNOSIS — I509 Heart failure, unspecified: Secondary | ICD-10-CM | POA: Insufficient documentation

## 2016-09-05 DIAGNOSIS — R4689 Other symptoms and signs involving appearance and behavior: Secondary | ICD-10-CM

## 2016-09-05 DIAGNOSIS — Z79899 Other long term (current) drug therapy: Secondary | ICD-10-CM | POA: Insufficient documentation

## 2016-09-05 DIAGNOSIS — R4589 Other symptoms and signs involving emotional state: Secondary | ICD-10-CM | POA: Insufficient documentation

## 2016-09-05 DIAGNOSIS — Z7982 Long term (current) use of aspirin: Secondary | ICD-10-CM | POA: Insufficient documentation

## 2016-09-05 MED ORDER — HALOPERIDOL LACTATE 5 MG/ML IJ SOLN
5.0000 mg | Freq: Once | INTRAMUSCULAR | Status: DC
  Filled 2016-09-05: qty 1

## 2016-09-05 MED ORDER — DIPHENHYDRAMINE HCL 50 MG/ML IJ SOLN
50.0000 mg | Freq: Once | INTRAMUSCULAR | Status: DC
  Filled 2016-09-05: qty 1

## 2016-09-05 MED ORDER — LORAZEPAM 2 MG/ML IJ SOLN
2.0000 mg | Freq: Once | INTRAMUSCULAR | Status: DC
  Filled 2016-09-05: qty 1

## 2016-09-05 NOTE — ED Notes (Signed)
Bed: WA29 Expected date:  Expected time:  Means of arrival:  Comments: 
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
## 2016-09-05 NOTE — ED Notes (Signed)
Patient refused all IM injections.  Dr. Erma HeritageIsaacs notified.

## 2016-09-05 NOTE — ED Notes (Signed)
Dr. Erma HeritageIsaacs at bedside.  Patient discharged to jail with [employer].
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
## 2016-09-07 ENCOUNTER — Encounter: Payer: Self-pay | Attending: Physical Medicine & Rehabilitation | Admitting: Physical Medicine & Rehabilitation

## 2016-09-07 ENCOUNTER — Encounter: Payer: Self-pay | Admitting: Physical Medicine & Rehabilitation

## 2016-09-07 VITALS — BP 116/81 | HR 70

## 2016-09-07 DIAGNOSIS — E7800 Pure hypercholesterolemia, unspecified: Secondary | ICD-10-CM | POA: Insufficient documentation

## 2016-09-07 DIAGNOSIS — M25571 Pain in right ankle and joints of right foot: Secondary | ICD-10-CM | POA: Insufficient documentation

## 2016-09-07 DIAGNOSIS — M1990 Unspecified osteoarthritis, unspecified site: Secondary | ICD-10-CM | POA: Insufficient documentation

## 2016-09-07 DIAGNOSIS — F1410 Cocaine abuse, uncomplicated: Secondary | ICD-10-CM | POA: Insufficient documentation

## 2016-09-07 DIAGNOSIS — I509 Heart failure, unspecified: Secondary | ICD-10-CM | POA: Insufficient documentation

## 2016-09-07 DIAGNOSIS — M1711 Unilateral primary osteoarthritis, right knee: Secondary | ICD-10-CM

## 2016-09-07 DIAGNOSIS — Z590 Homelessness: Secondary | ICD-10-CM | POA: Insufficient documentation

## 2016-09-07 DIAGNOSIS — I2510 Atherosclerotic heart disease of native coronary artery without angina pectoris: Secondary | ICD-10-CM | POA: Insufficient documentation

## 2016-09-07 DIAGNOSIS — M1712 Unilateral primary osteoarthritis, left knee: Secondary | ICD-10-CM

## 2016-09-07 DIAGNOSIS — Z9889 Other specified postprocedural states: Secondary | ICD-10-CM | POA: Insufficient documentation

## 2016-09-07 DIAGNOSIS — R531 Weakness: Secondary | ICD-10-CM | POA: Insufficient documentation

## 2016-09-07 DIAGNOSIS — I110 Hypertensive heart disease with heart failure: Secondary | ICD-10-CM | POA: Insufficient documentation

## 2016-09-07 DIAGNOSIS — I252 Old myocardial infarction: Secondary | ICD-10-CM | POA: Insufficient documentation

## 2016-09-07 DIAGNOSIS — F319 Bipolar disorder, unspecified: Secondary | ICD-10-CM | POA: Insufficient documentation

## 2016-09-07 DIAGNOSIS — F209 Schizophrenia, unspecified: Secondary | ICD-10-CM | POA: Insufficient documentation

## 2016-09-07 NOTE — Progress Notes (Signed)
Knee injection: Bilateral  Indication: Knee pain not relieved by medication management and other conservative care.  Informed consent was obtained after describing risks and benefits of the procedure with the patient, this includes bleeding, bruising, infection and medication side effects. The patient wishes to proceed and has given written consent. Patient was placed in a seated position. Bilateral knees were marked and prepped with betadine in the anteromedial area. Vapocoolant spray was applied. A 22-gauge 2 inch needle was inserted into the joint space. No fluid draw back with empty syringe. Medication syringe applied. After negative draw back for blood, a solution containing 1 mL of 6 mg per ML celestone and 4 mL of 1% lidocaine was injected (x2). A band aid was applied. The patient tolerated the procedure well. Post procedure instructions were given.

## 2016-09-21 ENCOUNTER — Other Ambulatory Visit: Payer: Self-pay | Admitting: Physical Medicine & Rehabilitation

## 2016-09-21 MED FILL — METOPROLOL SUCC ER 25 MG TA: 25 | 30 days supply | Qty: 15 | Fill #2

## 2016-09-21 MED FILL — FLUTICASONE PROP 50 MCG SPR: 50 | 30 days supply | Qty: 16 | Fill #4

## 2016-09-21 MED FILL — GABAPENTIN 600 MG TABLET: 600 | 30 days supply | Qty: 90 | Fill #1

## 2016-09-21 MED FILL — !VENTOLIN HFA INHALER: 108 (90 BAS | 25 days supply | Qty: 18 | Fill #1

## 2016-09-21 MED FILL — VOLTAREN 1% GEL: 1 | 12 days supply | Qty: 100 | Fill #1

## 2016-09-21 MED FILL — ?CLOPIDOGREL 75 MG TABLET: 75 | 30 days supply | Qty: 30 | Fill #4

## 2016-09-21 MED FILL — ATORVASTATIN 20 MG TABLET: 20 | 30 days supply | Qty: 30 | Fill #2

## 2016-09-26 ENCOUNTER — Other Ambulatory Visit: Payer: Self-pay

## 2016-09-26 MED ORDER — ALBUTEROL SULFATE HFA 108 (90 BASE) MCG/ACT IN AERS: 2.0000 | INHALATION_SPRAY | Freq: Four times a day (QID) | RESPIRATORY_TRACT | 3 refills | Status: DC | PRN

## 2016-10-14 MED FILL — $VENTOLIN HFA 18G INHALER: 108 (90 BAS | 29 days supply | Qty: 18 | Fill #0

## 2016-10-15 ENCOUNTER — Emergency Department (HOSPITAL_COMMUNITY)
Admission: EM | Admit: 2016-10-15 | Discharge: 2016-10-15 | Disposition: A | Discharge status: Home / Self Care | Payer: Self-pay | Attending: Emergency Medicine | Admitting: Emergency Medicine

## 2016-10-15 ENCOUNTER — Encounter (HOSPITAL_COMMUNITY): Payer: Self-pay | Admitting: Emergency Medicine

## 2016-10-15 ENCOUNTER — Emergency Department (HOSPITAL_COMMUNITY): Payer: Self-pay

## 2016-10-15 DIAGNOSIS — Y999 Unspecified external cause status: Secondary | ICD-10-CM | POA: Insufficient documentation

## 2016-10-15 DIAGNOSIS — I110 Hypertensive heart disease with heart failure: Secondary | ICD-10-CM | POA: Insufficient documentation

## 2016-10-15 DIAGNOSIS — Z7982 Long term (current) use of aspirin: Secondary | ICD-10-CM | POA: Insufficient documentation

## 2016-10-15 DIAGNOSIS — S61210A Laceration without foreign body of right index finger without damage to nail, initial encounter: Secondary | ICD-10-CM | POA: Insufficient documentation

## 2016-10-15 DIAGNOSIS — Z955 Presence of coronary angioplasty implant and graft: Secondary | ICD-10-CM | POA: Insufficient documentation

## 2016-10-15 DIAGNOSIS — Z79899 Other long term (current) drug therapy: Secondary | ICD-10-CM | POA: Insufficient documentation

## 2016-10-15 DIAGNOSIS — Z23 Encounter for immunization: Secondary | ICD-10-CM | POA: Insufficient documentation

## 2016-10-15 DIAGNOSIS — Y9301 Activity, walking, marching and hiking: Secondary | ICD-10-CM | POA: Insufficient documentation

## 2016-10-15 DIAGNOSIS — I509 Heart failure, unspecified: Secondary | ICD-10-CM | POA: Insufficient documentation

## 2016-10-15 DIAGNOSIS — Y929 Unspecified place or not applicable: Secondary | ICD-10-CM | POA: Insufficient documentation

## 2016-10-15 DIAGNOSIS — I2510 Atherosclerotic heart disease of native coronary artery without angina pectoris: Secondary | ICD-10-CM | POA: Insufficient documentation

## 2016-10-15 MED ORDER — HYDROCODONE-ACETAMINOPHEN 5-325 MG PO TABS
1.0000 | ORAL_TABLET | Freq: Once | ORAL | Status: AC
  Administered 2016-10-15: 1 via ORAL
  Filled 2016-10-15: qty 1

## 2016-10-15 MED ORDER — LIDOCAINE HCL (PF) 1 % IJ SOLN: 30.0000 mL | Freq: Once | INTRAMUSCULAR | Status: DC

## 2016-10-15 MED ORDER — AMOXICILLIN-POT CLAVULANATE 875-125 MG PO TABS: 1.0000 | ORAL_TABLET | Freq: Two times a day (BID) | ORAL | 0 refills | Status: DC

## 2016-10-15 MED ORDER — LIDOCAINE HCL (PF) 1 % IJ SOLN
10.0000 mL | Freq: Once | INTRAMUSCULAR | Status: AC
  Administered 2016-10-15: 10 mL
  Filled 2016-10-15: qty 10

## 2016-10-15 MED ORDER — TETANUS-DIPHTH-ACELL PERTUSSIS 5-2.5-18.5 LF-MCG/0.5 IM SUSP
0.5000 mL | Freq: Once | INTRAMUSCULAR | Status: AC
  Administered 2016-10-15: 0.5 mL via INTRAMUSCULAR
  Filled 2016-10-15: qty 0.5
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
## 2016-10-15 NOTE — Discharge Instructions (Addendum)
Keep wound clean with mild soap and water. Keep area covered with a topical antibiotic ointment and bandage, keep bandage dry, and do not submerge in water for 24 hours. Use splint to protect the area while it's healing, avoid bending/extending the finger too much which could break the stitches. Ice and elevate for additional pain and swelling relief. Alternate between Ibuprofen and Tylenol for additional pain relief. Take antibiotic as directed until completed. Follow up with your primary care doctor or the [facility] in approximately 3 days for wound recheck and in 7-10 days for wound check and suture removal. Monitor area for signs of infection to include, but not limited to: increasing pain, spreading redness, drainage/pus, worsening swelling, or fevers. Return to emergency department for emergent changing or worsening symptoms.

## 2016-10-18 ENCOUNTER — Other Ambulatory Visit: Payer: Self-pay | Admitting: Physical Medicine & Rehabilitation

## 2016-10-18 MED FILL — GABAPENTIN 600 MG TABLET: 600 | 30 days supply | Qty: 90 | Fill #1

## 2016-10-18 MED FILL — FLUTICASONE PROP 50 MCG SPR: 50 | 30 days supply | Qty: 16 | Fill #5

## 2016-10-18 MED FILL — ?ATORVASTATIN 20 MG TABLET: 20 | 30 days supply | Qty: 30 | Fill #3

## 2016-10-18 MED FILL — AMOX-CLAV 875-125 MG TABLET: 875-125 | 7 days supply | Qty: 14 | Fill #0

## 2016-10-18 MED FILL — VOLTAREN 1% GEL: 1 | 12 days supply | Qty: 100 | Fill #0

## 2016-10-18 MED FILL — METOPROLOL SUCC ER 25 MG TA: 25 | 30 days supply | Qty: 15 | Fill #3

## 2016-10-18 MED FILL — ?CLOPIDOGREL 75MG TAB: 75 | 30 days supply | Qty: 30 | Fill #5

## 2016-10-24 ENCOUNTER — Ambulatory Visit: Payer: Self-pay | Admitting: Family Medicine

## 2016-10-28 ENCOUNTER — Ambulatory Visit: Payer: Self-pay | Attending: Family Medicine

## 2016-11-01 ENCOUNTER — Encounter: Payer: Self-pay | Admitting: Family Medicine

## 2016-11-01 ENCOUNTER — Ambulatory Visit: Payer: Self-pay | Attending: Family Medicine | Admitting: Family Medicine

## 2016-11-01 VITALS — BP 130/78 | HR 71 | Temp 98.0°F | Wt 199.6 lb

## 2016-11-01 DIAGNOSIS — Z9889 Other specified postprocedural states: Secondary | ICD-10-CM | POA: Insufficient documentation

## 2016-11-01 DIAGNOSIS — Z7982 Long term (current) use of aspirin: Secondary | ICD-10-CM | POA: Insufficient documentation

## 2016-11-01 DIAGNOSIS — I509 Heart failure, unspecified: Secondary | ICD-10-CM | POA: Insufficient documentation

## 2016-11-01 DIAGNOSIS — Z79899 Other long term (current) drug therapy: Secondary | ICD-10-CM | POA: Insufficient documentation

## 2016-11-01 DIAGNOSIS — F259 Schizoaffective disorder, unspecified: Secondary | ICD-10-CM | POA: Insufficient documentation

## 2016-11-01 DIAGNOSIS — I110 Hypertensive heart disease with heart failure: Secondary | ICD-10-CM | POA: Insufficient documentation

## 2016-11-01 DIAGNOSIS — Z888 Allergy status to other drugs, medicaments and biological substances status: Secondary | ICD-10-CM | POA: Insufficient documentation

## 2016-11-01 DIAGNOSIS — B351 Tinea unguium: Secondary | ICD-10-CM | POA: Insufficient documentation

## 2016-11-01 MED ORDER — TERBINAFINE HCL 250 MG PO TABS: 250.0000 mg | ORAL_TABLET | Freq: Every day | ORAL | 1 refills | Status: DC

## 2016-11-01 MED FILL — TERBINAFINE HCL 250 MG TAB: 250 | 30 days supply | Qty: 30 | Fill #0

## 2016-11-01 NOTE — Progress Notes (Signed)
Subjective:  Patient ID: Brett Salinas, male    DOB: 1970-08-09  Age: 46 y.o. MRN: [id number]  CC: Toe Pain   HPI Brett Salinas is a 46 year old male with a history of schizoaffective disorder, coronary artery disease status post PCI with drug-eluting stent (currently on dual antiplatelet therapy with Plavix and aspirin) who presents today for an acute visit complaining of "toe fungus" for the last 1 year. He complains of stinging and burning at the tip of his toes. He denies wearing closed toe shoes for prolonged periods of time and review of his med list indicates he is on gabapentin.    Past Medical History:  Diagnosis Date  . Arthritis    "hips, knees, back" (10/13/2015)  . Bipolar disorder ([place])   . CHF (congestive heart failure) ([place])   . Coronary artery disease   . Depression   . DVT (deep venous thrombosis) ([place]) 09/2015   right  . Heart murmur   . High cholesterol   . Hypertension   . Myocardial infarction ([place]) 10/12/2015 X 2-3  . Rectal myiasis   . Schizophrenia ([place])   . Sciatica     Past Surgical History:  Procedure Laterality Date  . CARDIAC CATHETERIZATION N/A 10/13/2015   Procedure: Left Heart Cath and Coronary Angiography;  Surgeon: Jettie Booze, MD;  Location: [place] [facility];  Service: Cardiovascular;  Laterality: N/A;  . CARDIAC CATHETERIZATION N/A 10/13/2015   Procedure: Coronary Stent Intervention;  Surgeon: Jettie Booze, MD;  Location: [place];  Service: Cardiovascular;  Laterality: N/A;  . CARDIAC CATHETERIZATION N/A 10/13/2015   Procedure: Intravascular Ultrasound/IVUS;  Surgeon: Jettie Booze, MD;  Location: [place] [facility];  Service: Cardiovascular;  Laterality: N/A;  . CORONARY ANGIOPLASTY WITH STENT PLACEMENT  10/13/2015  . I&D EXTREMITY Right 09/19/2015   Procedure: IRRIGATION AND DEBRIDEMENT ANKLE LACERATIONS POSSIBLE TENDON REPAIR;  Surgeon: Meredith Pel, MD;  Location: [facility] [place];  Service: Orthopedics;   Laterality: Right;  . I&D EXTREMITY Right 10/14/2015   Procedure: IRRIGATION AND DEBRIDEMENT EXTREMITY/Right Ankle;  Surgeon: Meredith Pel, MD;  Location: [place];  Service: Orthopedics;  Laterality: Right;    Allergies  Allergen Reactions  . 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 Heparin Itching    Full-body itching inc throat  . Pork-Derived Products Itching    Burning/itching     Outpatient Medications Prior to Visit  Medication Sig Dispense Refill  . albuterol (VENTOLIN HFA) 108 (90 Base) MCG/ACT inhaler Inhale 2 puffs into the lungs every 6 (six) hours as needed for wheezing or shortness of breath. 54 g 3  . aspirin EC 81 MG tablet Take 1 tablet (81 mg total) by mouth daily. 90 tablet 3  . carbamazepine (TEGRETOL) 200 MG tablet Take 200 mg by mouth at bedtime.    . carbamide peroxide (DEBROX) 6.5 % OTIC solution Place 5 drops into the left ear 2 (two) times daily. 15 mL 0  . clopidogrel (PLAVIX) 75 MG tablet Take 1 tablet (75 mg total) by mouth daily. 90 tablet 3  . fluticasone (FLONASE) 50 MCG/ACT nasal spray Place 2 sprays into both nostrils daily. 16 g 6  . gabapentin (NEURONTIN) 600 MG tablet Take 1 tablet (600 mg total) by mouth 3 (three) times daily. 90 tablet 1  . metoprolol succinate (TOPROL XL) 25 MG 24 hr tablet Take 0.5 tablets (12.5 mg total) by mouth daily. 45 tablet 3  . OLANZapine (ZYPREXA) 10 MG tablet Take 10 mg by  mouth at bedtime.    . VOLTAREN 1 % GEL APPLY 2 GRAMS TOPICALLY 4 TIMES DAILY 100 g 1  . amoxicillin-clavulanate (AUGMENTIN) 875-125 MG tablet Take 1 tablet by mouth 2 (two) times daily. One po bid x 7 days (Patient not taking: Reported on 11/01/2016) 14 tablet 0  . atorvastatin (LIPITOR) 20 MG tablet Take 1 tablet (20 mg total) by mouth daily. 90 tablet 2  . loratadine (CLARITIN) 10 MG tablet Take 1 tablet (10 mg total) by mouth daily. (Patient not taking: Reported on 11/01/2016) 30 tablet 11  . nitroGLYCERIN (NITROSTAT) 0.4 MG SL tablet Place 1 tablet  (0.4 mg total) under the tongue every 5 (five) minutes x 3 doses as needed for chest pain. (Patient not taking: Reported on 11/01/2016) 30 tablet 12   No facility-administered medications prior to visit.     ROS Review of Systems  Constitutional: Negative for activity change and appetite change.  HENT: Negative for sinus pressure and sore throat.   Eyes: Negative for visual disturbance.  Respiratory: Negative for cough, chest tightness and shortness of breath.   Cardiovascular: Negative for chest pain and leg swelling.  Gastrointestinal: Negative for abdominal distention, abdominal pain, constipation and diarrhea.  Endocrine: Negative.   Genitourinary: Negative for dysuria.  Musculoskeletal: Negative for joint swelling and myalgias.  Skin:       See hpi  Allergic/Immunologic: Negative.   Neurological: Negative for weakness, light-headedness and numbness.  Psychiatric/Behavioral: Negative for dysphoric mood and suicidal ideas.    Objective:  BP 130/78   Pulse 71   Temp 98 F (36.7 C) (Oral)   Wt 199 lb 9.6 oz (90.5 kg)   SpO2 99%   BMI 22.49 kg/m   BP/Weight 11/01/2016 9/70/2637 09/04/8848  Systolic BP 277 412 878  Diastolic BP 78 676 81  Wt. (Lbs) 199.6 - -  BMI 22.49 - -      Physical Exam  Constitutional: He is oriented to person, place, and time. He appears well-developed and well-nourished.  Cardiovascular: Normal rate, normal heart sounds and intact distal pulses.   No murmur heard. Pulmonary/Chest: Effort normal and breath sounds normal. He has no wheezes. He has no rales. He exhibits no tenderness.  Abdominal: Soft. Bowel sounds are normal. He exhibits no distension and no mass. There is no tenderness.  Musculoskeletal: Normal range of motion.  Neurological: He is alert and oriented to person, place, and time.  Skin:  Onychomycosis in toenails x10 Whitish discoloration in fourth webspaces bilaterally     Assessment & Plan:   1. Onychomycosis/tinea  pedis Discussed side effects of terbinafine We'll check hepatic function today and again at next visit - CMP14+EGFR - terbinafine (LAMISIL) 250 MG tablet; Take 1 tablet (250 mg total) by mouth daily.  Dispense: 30 tablet; Refill: 1   Meds ordered this encounter  Medications  . terbinafine (LAMISIL) 250 MG tablet    Sig: Take 1 tablet (250 mg total) by mouth daily.    Dispense:  30 tablet    Refill:  1    Follow-up: Return in about 3 months (around 02/01/2017) for Follow-up of chronic medical conditions.   Arnoldo Morale MD

## 2016-11-01 NOTE — Patient Instructions (Signed)

## 2016-11-02 LAB — CMP14+EGFR
A/G RATIO: 1.9 (ref 1.2–2.2)
ALT: 24 IU/L (ref 0–44)
AST: 29 IU/L (ref 0–40)
Albumin: 4.2 g/dL (ref 3.5–5.5)
Alkaline Phosphatase: 62 IU/L (ref 39–117)
BILIRUBIN TOTAL: 1.1 mg/dL (ref 0.0–1.2)
BUN/Creatinine Ratio: 14 (ref 9–20)
BUN: 18 mg/dL (ref 6–24)
CHLORIDE: 105 mmol/L (ref 96–106)
CO2: 23 mmol/L (ref 20–29)
Calcium: 9.7 mg/dL (ref 8.7–10.2)
Creatinine, Ser: 1.26 mg/dL (ref 0.76–1.27)
GFR calc Af Amer: 79 mL/min/{1.73_m2} (ref >59)
GFR calc non Af Amer: 68 mL/min/{1.73_m2} (ref >59)
Globulin, Total: 2.2 g/dL (ref 1.5–4.5)
Glucose: 80 mg/dL (ref 65–99)
POTASSIUM: 4.3 mmol/L (ref 3.5–5.2)
Sodium: 145 mmol/L — ABNORMAL HIGH (ref 134–144)
Total Protein: 6.4 g/dL (ref 6.0–8.5)

## 2016-11-09 ENCOUNTER — Encounter: Payer: Self-pay | Admitting: Physical Medicine & Rehabilitation

## 2016-11-09 ENCOUNTER — Encounter: Payer: Self-pay | Attending: Physical Medicine & Rehabilitation | Admitting: Physical Medicine & Rehabilitation

## 2016-11-09 VITALS — BP 157/116 | HR 106 | Resp 14

## 2016-11-09 DIAGNOSIS — R531 Weakness: Secondary | ICD-10-CM | POA: Insufficient documentation

## 2016-11-09 DIAGNOSIS — F209 Schizophrenia, unspecified: Secondary | ICD-10-CM | POA: Insufficient documentation

## 2016-11-09 DIAGNOSIS — I2510 Atherosclerotic heart disease of native coronary artery without angina pectoris: Secondary | ICD-10-CM | POA: Insufficient documentation

## 2016-11-09 DIAGNOSIS — Z9889 Other specified postprocedural states: Secondary | ICD-10-CM | POA: Insufficient documentation

## 2016-11-09 DIAGNOSIS — M25562 Pain in left knee: Secondary | ICD-10-CM

## 2016-11-09 DIAGNOSIS — M25561 Pain in right knee: Secondary | ICD-10-CM

## 2016-11-09 DIAGNOSIS — Z590 Homelessness: Secondary | ICD-10-CM | POA: Insufficient documentation

## 2016-11-09 DIAGNOSIS — I110 Hypertensive heart disease with heart failure: Secondary | ICD-10-CM | POA: Insufficient documentation

## 2016-11-09 DIAGNOSIS — M1990 Unspecified osteoarthritis, unspecified site: Secondary | ICD-10-CM | POA: Insufficient documentation

## 2016-11-09 DIAGNOSIS — I509 Heart failure, unspecified: Secondary | ICD-10-CM | POA: Insufficient documentation

## 2016-11-09 DIAGNOSIS — M25571 Pain in right ankle and joints of right foot: Secondary | ICD-10-CM | POA: Insufficient documentation

## 2016-11-09 DIAGNOSIS — I252 Old myocardial infarction: Secondary | ICD-10-CM | POA: Insufficient documentation

## 2016-11-09 DIAGNOSIS — G8929 Other chronic pain: Secondary | ICD-10-CM

## 2016-11-09 DIAGNOSIS — E7800 Pure hypercholesterolemia, unspecified: Secondary | ICD-10-CM | POA: Insufficient documentation

## 2016-11-09 DIAGNOSIS — S86001A Unspecified injury of right Achilles tendon, initial encounter: Secondary | ICD-10-CM

## 2016-11-09 DIAGNOSIS — F319 Bipolar disorder, unspecified: Secondary | ICD-10-CM | POA: Insufficient documentation

## 2016-11-09 DIAGNOSIS — M1712 Unilateral primary osteoarthritis, left knee: Secondary | ICD-10-CM

## 2016-11-09 DIAGNOSIS — M1711 Unilateral primary osteoarthritis, right knee: Secondary | ICD-10-CM

## 2016-11-09 DIAGNOSIS — I10 Essential (primary) hypertension: Secondary | ICD-10-CM

## 2016-11-09 DIAGNOSIS — F1410 Cocaine abuse, uncomplicated: Secondary | ICD-10-CM | POA: Insufficient documentation

## 2016-11-09 MED ORDER — GABAPENTIN 600 MG PO TABS: 600.0000 mg | ORAL_TABLET | Freq: Three times a day (TID) | ORAL | 1 refills | Status: DC
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
## 2016-11-09 NOTE — Addendum Note (Signed)
Addended by: Maryla Morrow A on: 11/09/2016 11:03 AM   Modules accepted: Level of Service

## 2016-12-11 ENCOUNTER — Encounter (HOSPITAL_COMMUNITY): Payer: Self-pay

## 2016-12-11 ENCOUNTER — Emergency Department (HOSPITAL_COMMUNITY)
Admission: EM | Admit: 2016-12-11 | Discharge: 2016-12-11 | Disposition: A | Discharge status: Home / Self Care | Payer: Self-pay | Attending: Emergency Medicine | Admitting: Emergency Medicine

## 2016-12-11 DIAGNOSIS — I252 Old myocardial infarction: Secondary | ICD-10-CM | POA: Insufficient documentation

## 2016-12-11 DIAGNOSIS — I110 Hypertensive heart disease with heart failure: Secondary | ICD-10-CM | POA: Insufficient documentation

## 2016-12-11 DIAGNOSIS — I509 Heart failure, unspecified: Secondary | ICD-10-CM | POA: Insufficient documentation

## 2016-12-11 DIAGNOSIS — L03213 Periorbital cellulitis: Secondary | ICD-10-CM | POA: Insufficient documentation

## 2016-12-11 DIAGNOSIS — I2510 Atherosclerotic heart disease of native coronary artery without angina pectoris: Secondary | ICD-10-CM | POA: Insufficient documentation

## 2016-12-11 DIAGNOSIS — H5711 Ocular pain, right eye: Secondary | ICD-10-CM

## 2016-12-11 DIAGNOSIS — H1031 Unspecified acute conjunctivitis, right eye: Secondary | ICD-10-CM | POA: Insufficient documentation

## 2016-12-11 DIAGNOSIS — H109 Unspecified conjunctivitis: Secondary | ICD-10-CM

## 2016-12-11 MED ORDER — ERYTHROMYCIN 5 MG/GM OP OINT: TOPICAL_OINTMENT | OPHTHALMIC | 0 refills | Status: DC

## 2016-12-11 MED ORDER — TETRACAINE HCL 0.5 % OP SOLN
1.0000 [drp] | Freq: Once | OPHTHALMIC | Status: AC
  Administered 2016-12-11: 1 [drp] via OPHTHALMIC
  Filled 2016-12-11: qty 4

## 2016-12-11 MED ORDER — LIDOCAINE-EPINEPHRINE-TETRACAINE (LET) SOLUTION
3.0000 mL | Freq: Once | NASAL | Status: DC
  Filled 2016-12-11: qty 3

## 2016-12-11 MED ORDER — CLINDAMYCIN HCL 150 MG PO CAPS: 450.0000 mg | ORAL_CAPSULE | Freq: Three times a day (TID) | ORAL | 0 refills | Status: AC

## 2016-12-11 MED ORDER — FLUORESCEIN SODIUM 1 MG OP STRP
1.0000 | ORAL_STRIP | Freq: Once | OPHTHALMIC | Status: AC
  Administered 2016-12-11: 1 via OPHTHALMIC
  Filled 2016-12-11: qty 1

## 2016-12-11 NOTE — ED Provider Notes (Signed)
Emergency Department Provider Note   I have reviewed the triage vital signs and the nursing notes.   HISTORY  Chief Complaint Eye Drainage   HPI Brett Salinas is a 46 y.o. male with PMH of CHF, HTN, HLD, and Schizophrenia presents to the emergency department for evaluation of right eye pain with discharge over the past 3-4 days.  Unclear inciting event.  The patient has significant discomfort in the eye worse with exposure to light.  He feels more comfortable with his eye closed.  No obvious trauma to the eye. No fever/chills. No report of FB in the eye.     Past Medical History:  Diagnosis Date  . Arthritis    "hips, knees, back" (10/13/2015)  . Bipolar disorder ([facility])   . CHF (congestive heart failure) ([facility])   . Coronary artery disease   . Depression   . DVT (deep venous thrombosis) ([facility]) 09/2015   right  . Heart murmur   . High cholesterol   . Hypertension   . Myocardial infarction ([facility]) 10/12/2015 X 2-3  . Rectal myiasis   . Schizophrenia ([facility])   . Sciatica     Patient Active Problem List   Diagnosis Date Noted  . Right knee pain 07/20/2016  . Essential hypertension 11/30/2015  . CAD -S/P PCI LAD/DES 10/13/15 10/14/2015  . Wound infection- for I&D achilles wound 10/14/15 10/14/2015  . Cocaine abuse-drug sceen positive 10/14/2015  . Schizoaffective disorder, bipolar type ([facility]) 10/14/2015  . Infection 10/14/2015  . NSTEMI (non-ST elevated myocardial infarction) ([facility])   . Chest pain 10/12/2015  . Achilles tendon rupture-surgery 09/19/15 09/19/2015    Past Surgical History:  Procedure Laterality Date  . CORONARY ANGIOPLASTY WITH STENT PLACEMENT  10/13/2015    Current Outpatient Rx  . Order #: [id number] Class: Print  . Order #: [id number] Class: Print  . Order #: [id number] Class: OTC  . Order #: [id number] Class: Normal  . Order #: [id number] Class: Historical Med  . Order #: [id number] Class: Normal  . Order #: [id number] Class: Print  . Order #: [id number] Class: Normal   . Order #: [id number] Class: Print  . Order #: [id number] Class: Normal  . Order #: [id number] Class: Print  . Order #: [id number] Class: Normal  . Order #: [id number] Class: Normal  . Order #: [id number] Class: Print  . Order #: [id number] Class: Historical Med  . Order #: [id number] Class: Normal  . Order #: [id number] Class: Normal    Allergies Catfish [fish allergy]; Heparin; and Pork-derived products  No family history on file.  Social History Social History   Tobacco Use  . Smoking status: Never Smoker  . Smokeless tobacco: Never Used  Substance Use Topics  . Alcohol use: Yes  . Drug use: Yes    Types: Cocaine, Marijuana    Comment: 10/13/2015 "I've tried alot; I don't have any habits"    Review of Systems  Constitutional: No fever/chills Eyes: Positive decreased vision right eye with drainage and photophobia.  ENT: No sore throat. Cardiovascular: Denies chest pain. Respiratory: Denies shortness of breath. Gastrointestinal: No abdominal pain.  No nausea, no vomiting.  No diarrhea.  No constipation. Genitourinary: Negative for dysuria. Musculoskeletal: Negative for back pain. Skin: Negative for rash. Neurological: Negative for headaches, focal weakness or numbness.  10-point ROS otherwise negative.  ____________________________________________   PHYSICAL EXAM:  VITAL SIGNS:   Constitutional: Alert and oriented. Appears uncomfortable.  Eyes: Conjunctivae injected on the right with watery discharge. Significant photophobia. Pressure: R 19; L22. Lid everted with no FB. Diffuse conjunctival uptake with fluorescein staining. No Seidel sign.  No corneal abrasions. Normal EOM without pain. Right upper lid edema and redness without sty or abscess.  Head: Atraumatic. Nose: No congestion/rhinnorhea. Mouth/Throat: Mucous membranes are moist.  Neck: No stridor.   Cardiovascular: Good peripheral circulation.   Respiratory: Normal respiratory effort.  Gastrointestinal: No distention.   Musculoskeletal: No lower extremity tenderness nor edema. No gross deformities of extremities. Neurologic:  Normal speech and language. No gross focal neurologic deficits are appreciated.  Skin:  Skin is warm, dry and intact. No rash noted.  ____________________________________________   PROCEDURES  Procedure(s) performed:   Procedures  None ____________________________________________   INITIAL IMPRESSION / ASSESSMENT AND PLAN / ED COURSE  Pertinent labs & imaging results that were available during my care of the patient were reviewed by me and considered in my medical decision making (see chart for details).  Patient presents to the emergency department with 3-4 days of right eye redness, pain, photophobia.  His presentation is most consistent with a conjunctivitis but he has significant photophobia for this.  He has some right upper lid swelling but no stye or abscess.  Possibly some developing periorbital cellulitis.  Plan to treat for conjunctivitis/occult corneal abrasion with erythromycin ointment along with clindamycin for possible developing periorbital cellulitis.  I have no clinical concern for orbital cellulitis.  Patient's intraocular pressures are within normal limits.  Plan for ophthalmology follow-up in the next week. No evidence of globe injury/rupture.   At this time, I do not feel there is any life-threatening condition present. I have reviewed and discussed all results (EKG, imaging, lab, urine as appropriate), exam findings with patient. I have reviewed nursing notes and appropriate previous records.  I feel the patient is safe to be discharged home without further emergent workup. Discussed usual and customary return precautions. Patient and family (if present) verbalize understanding and are comfortable with this plan.  Patient will follow-up with their primary care provider. If they do not have a primary care provider, information for follow-up has been provided to  them. All questions have been answered.   ____________________________________________  FINAL CLINICAL IMPRESSION(S) / ED DIAGNOSES  Final diagnoses:  Bacterial conjunctivitis of right eye  Acute right eye pain  Periorbital cellulitis of right eye     MEDICATIONS GIVEN DURING THIS VISIT:  Medications  fluorescein ophthalmic strip 1 strip (1 strip Both Eyes Given by Other 12/11/16 1407)  tetracaine (PONTOCAINE) 0.5 % ophthalmic solution 1 drop (1 drop Right Eye Given by Other 12/11/16 1407)    Note:  This document was prepared using Dragon voice recognition software and may include unintentional dictation errors.  Alona BeneJoshua Long, MD Emergency Medicine    Long, Arlyss RepressJoshua G, MD 12/11/16 1415

## 2016-12-11 NOTE — ED Notes (Signed)
Pt unable to perform visual acuity screening, unable to open eye do to pain.

## 2016-12-11 NOTE — ED Triage Notes (Signed)
Patient complains of right eye pain with drainage x 3-4 days. Unknown trauma with photophobia

## 2016-12-11 NOTE — ED Notes (Signed)
EDP aware of patient bp  

## 2016-12-11 NOTE — Discharge Instructions (Signed)
You were seen in the ED today with right eye pain. This may be from an infection either on the eye surface or in the skin around the eye. We are treating you for both. Follow up with the eye doctor by phone tomorrow to schedule a follow up appointment next week. Use the antibiotics as directed.   Return to the ED with any new or worsening eye pain, fever, chills, or sudden severe headaches.

## 2016-12-11 NOTE — ED Notes (Signed)
Tono-pen and woods lamp bedside.

## 2016-12-23 ENCOUNTER — Emergency Department (HOSPITAL_COMMUNITY): Payer: Self-pay

## 2016-12-23 ENCOUNTER — Inpatient Hospital Stay (HOSPITAL_COMMUNITY)
Admission: EM | Admit: 2016-12-23 | Discharge: 2016-12-28 | DRG: 885 | Disposition: A | Discharge status: Other Acute Inpatient Hospital | Payer: Self-pay | Attending: Family Medicine | Admitting: Family Medicine

## 2016-12-23 ENCOUNTER — Encounter (HOSPITAL_COMMUNITY): Payer: Self-pay | Admitting: Emergency Medicine

## 2016-12-23 DIAGNOSIS — F250 Schizoaffective disorder, bipolar type: Principal | ICD-10-CM | POA: Diagnosis present

## 2016-12-23 DIAGNOSIS — I2510 Atherosclerotic heart disease of native coronary artery without angina pectoris: Secondary | ICD-10-CM | POA: Diagnosis present

## 2016-12-23 DIAGNOSIS — I10 Essential (primary) hypertension: Secondary | ICD-10-CM | POA: Diagnosis present

## 2016-12-23 DIAGNOSIS — R451 Restlessness and agitation: Secondary | ICD-10-CM | POA: Diagnosis present

## 2016-12-23 DIAGNOSIS — I252 Old myocardial infarction: Secondary | ICD-10-CM

## 2016-12-23 DIAGNOSIS — F1910 Other psychoactive substance abuse, uncomplicated: Secondary | ICD-10-CM

## 2016-12-23 DIAGNOSIS — B351 Tinea unguium: Secondary | ICD-10-CM | POA: Diagnosis present

## 2016-12-23 DIAGNOSIS — E785 Hyperlipidemia, unspecified: Secondary | ICD-10-CM | POA: Diagnosis present

## 2016-12-23 DIAGNOSIS — R4182 Altered mental status, unspecified: Secondary | ICD-10-CM | POA: Diagnosis present

## 2016-12-23 DIAGNOSIS — N179 Acute kidney failure, unspecified: Secondary | ICD-10-CM | POA: Diagnosis present

## 2016-12-23 DIAGNOSIS — R4689 Other symptoms and signs involving appearance and behavior: Secondary | ICD-10-CM | POA: Diagnosis present

## 2016-12-23 DIAGNOSIS — Z590 Homelessness: Secondary | ICD-10-CM

## 2016-12-23 DIAGNOSIS — Z955 Presence of coronary angioplasty implant and graft: Secondary | ICD-10-CM

## 2016-12-23 HISTORY — DX: Schizoaffective disorder, bipolar type: F25.0

## 2016-12-23 HISTORY — DX: Strain of unspecified achilles tendon, initial encounter: S86.019A

## 2016-12-23 HISTORY — DX: Altered mental status, unspecified: R41.82

## 2016-12-23 HISTORY — DX: Atherosclerotic heart disease of native coronary artery without angina pectoris: I25.10

## 2016-12-23 HISTORY — DX: Non-ST elevation (NSTEMI) myocardial infarction: I21.4

## 2016-12-23 HISTORY — DX: Coronary angioplasty status: Z98.61

## 2016-12-23 HISTORY — DX: Essential (primary) hypertension: I10

## 2016-12-23 HISTORY — DX: Cocaine abuse, uncomplicated: F14.10

## 2016-12-23 LAB — CBC WITH DIFFERENTIAL/PLATELET
Basophils Absolute: 0 10*3/uL (ref 0.0–0.1)
Basophils Relative: 0 %
EOS PCT: 2 %
Eosinophils Absolute: 0.1 10*3/uL (ref 0.0–0.7)
HEMATOCRIT: 42 % (ref 39.0–52.0)
Hemoglobin: 14.3 g/dL (ref 13.0–17.0)
LYMPHS ABS: 2.2 10*3/uL (ref 0.7–4.0)
Lymphocytes Relative: 31 %
MCH: 29.9 pg (ref 26.0–34.0)
MCHC: 34 g/dL (ref 30.0–36.0)
MCV: 87.9 fL (ref 78.0–100.0)
MONO ABS: 0.2 10*3/uL (ref 0.1–1.0)
MONOS PCT: 2 %
Neutro Abs: 4.5 10*3/uL (ref 1.7–7.7)
Neutrophils Relative %: 65 %
PLATELETS: 229 10*3/uL (ref 150–400)
RBC: 4.78 MIL/uL (ref 4.22–5.81)
RDW: 12.6 % (ref 11.5–15.5)
WBC: 7 10*3/uL (ref 4.0–10.5)

## 2016-12-23 LAB — COMPREHENSIVE METABOLIC PANEL
ALT: 30 U/L (ref 17–63)
ANION GAP: 9 (ref 5–15)
AST: 48 U/L — ABNORMAL HIGH (ref 15–41)
Albumin: 4.1 g/dL (ref 3.5–5.0)
Alkaline Phosphatase: 68 U/L (ref 38–126)
BILIRUBIN TOTAL: 1.5 mg/dL — AB (ref 0.3–1.2)
BUN: 19 mg/dL (ref 6–20)
CO2: 27 mmol/L (ref 22–32)
Calcium: 9.7 mg/dL (ref 8.9–10.3)
Chloride: 102 mmol/L (ref 101–111)
Creatinine, Ser: 1.33 mg/dL — ABNORMAL HIGH (ref 0.61–1.24)
GFR calc Af Amer: >60 mL/min (ref >60)
Glucose, Bld: 88 mg/dL (ref 65–99)
POTASSIUM: 4.4 mmol/L (ref 3.5–5.1)
Sodium: 138 mmol/L (ref 135–145)
TOTAL PROTEIN: 7.2 g/dL (ref 6.5–8.1)

## 2016-12-23 LAB — TSH: TSH: 1.124 u[IU]/mL (ref 0.350–4.500)

## 2016-12-23 LAB — ETHANOL

## 2016-12-23 LAB — ACETAMINOPHEN LEVEL

## 2016-12-23 LAB — AMMONIA: AMMONIA: 42 umol/L — AB (ref 9–35)

## 2016-12-23 MED ORDER — THIAMINE HCL 100 MG/ML IJ SOLN
100.0000 mg | Freq: Every day | INTRAMUSCULAR | Status: DC
  Filled 2016-12-23 (×2): qty 2

## 2016-12-23 MED ORDER — LORAZEPAM 2 MG/ML IJ SOLN: 1.0000 mg | Freq: Four times a day (QID) | INTRAMUSCULAR | Status: DC | PRN

## 2016-12-23 MED ORDER — LORAZEPAM 1 MG PO TABS
1.0000 mg | ORAL_TABLET | Freq: Four times a day (QID) | ORAL | Status: AC | PRN
  Administered 2016-12-26: 1 mg via ORAL
  Filled 2016-12-23: qty 1

## 2016-12-23 MED ORDER — VITAMIN B-1 100 MG PO TABS
100.0000 mg | ORAL_TABLET | Freq: Every day | ORAL | Status: DC
  Administered 2016-12-25 – 2016-12-28 (×4): 100 mg via ORAL
  Filled 2016-12-23 (×4): qty 1

## 2016-12-23 MED ORDER — SODIUM CHLORIDE 0.9 % IV SOLN
INTRAVENOUS | Status: DC
  Administered 2016-12-24: via INTRAVENOUS

## 2016-12-23 MED ORDER — FONDAPARINUX SODIUM 2.5 MG/0.5ML ~~LOC~~ SOLN
2.5000 mg | Freq: Every day | SUBCUTANEOUS | Status: DC
  Administered 2016-12-25: 2.5 mg via SUBCUTANEOUS
  Filled 2016-12-23 (×4): qty 0.5

## 2016-12-23 MED ORDER — LORAZEPAM 2 MG/ML IJ SOLN
1.0000 mg | Freq: Four times a day (QID) | INTRAMUSCULAR | Status: AC | PRN
  Administered 2016-12-24 (×2): 1 mg via INTRAVENOUS
  Filled 2016-12-23 (×2): qty 1

## 2016-12-23 MED ORDER — ENOXAPARIN SODIUM 40 MG/0.4ML ~~LOC~~ SOLN: 40.0000 mg | SUBCUTANEOUS | Status: DC

## 2016-12-23 MED ORDER — ADULT MULTIVITAMIN W/MINERALS CH
1.0000 | ORAL_TABLET | Freq: Every day | ORAL | Status: DC
  Administered 2016-12-25 – 2016-12-28 (×4): 1 via ORAL
  Filled 2016-12-23 (×4): qty 1

## 2016-12-23 MED ORDER — FOLIC ACID 1 MG PO TABS
1.0000 mg | ORAL_TABLET | Freq: Every day | ORAL | Status: DC
  Administered 2016-12-25 – 2016-12-28 (×4): 1 mg via ORAL
  Filled 2016-12-23 (×4): qty 1

## 2016-12-23 MED ORDER — LORAZEPAM 2 MG/ML IJ SOLN
1.0000 mg | Freq: Once | INTRAMUSCULAR | Status: AC
  Administered 2016-12-23: 1 mg via INTRAVENOUS
  Filled 2016-12-23: qty 1

## 2016-12-23 NOTE — ED Notes (Signed)
Pt. To MRI via stretcher. 

## 2016-12-23 NOTE — ED Notes (Signed)
Condom cath placed.
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
## 2016-12-23 NOTE — ED Notes (Signed)
Pt. Is sleeping with vital signs within limits. Pt. Is not combative. 4 point restraints removed in 30 minute incriminates. Pt. Out of all 4. Will monitor closely

## 2016-12-23 NOTE — ED Notes (Signed)
Attempted to get patient's blood. Patient grabs this RN's arm squeezing. Patient asked several times to let go.

## 2016-12-23 NOTE — ED Notes (Signed)
Attempted report X1

## 2016-12-23 NOTE — ED Provider Notes (Signed)
[facility] EMERGENCY DEPARTMENT Provider Note   CSN: [id number] Arrival date & time: 12/23/16  1811     History   Chief Complaint Chief Complaint  Patient presents with  . Altered Mental Status    HPI Brett Salinas is a 46 y.o. male.  HPI 46 year old male with a history of bipolar disorder and schizophrenia as well as CAD, CHF, HLD, HTN presenting with altered mental status.  He was found at a craft store sitting in the floor mumbling and not making sense.  He was a little agitated with EMS and has been confused in route.  Vital signs of been normal per EMS.  Patient is unable to answer questions appropriately.  He will occasionally was said and look at you but does not make sense when he tries to speak.  EMS states they found a white substance on his coat.  Patient does have a history of cocaine abuse in the past.  Past Medical History:  Diagnosis Date  . Arthritis    "hips, knees, back" (10/13/2015)  . Bipolar disorder (HCC)   . CHF (congestive heart failure) (HCC)   . Coronary artery disease   . Depression   . DVT (deep venous thrombosis) (HCC) 09/2015   right  . Heart murmur   . High cholesterol   . Hypertension   . Myocardial infarction (HCC) 10/12/2015 X 2-3  . Rectal myiasis   . Schizophrenia (HCC)   . Sciatica     Patient Active Problem List   Diagnosis Date Noted  . Right knee pain 07/20/2016  . Essential hypertension 11/30/2015  . CAD -S/P PCI LAD/DES 10/13/15 10/14/2015  . Wound infection- for I&D achilles wound 10/14/15 10/14/2015  . Cocaine abuse-drug sceen positive 10/14/2015  . Schizoaffective disorder, bipolar type (HCC) 10/14/2015  . Infection 10/14/2015  . NSTEMI (non-ST elevated myocardial infarction) (HCC)   . Chest pain 10/12/2015  . Achilles tendon rupture-surgery 09/19/15 09/19/2015    Past Surgical History:  Procedure Laterality Date  . CARDIAC CATHETERIZATION N/A 10/13/2015   Procedure: Left Heart Cath and Coronary  Angiography;  Surgeon: Corky Crafts, MD;  Location: [facility] INVASIVE CV LAB;  Service: Cardiovascular;  Laterality: N/A;  . CARDIAC CATHETERIZATION N/A 10/13/2015   Procedure: Coronary Stent Intervention;  Surgeon: Corky Crafts, MD;  Location: [facility] INVASIVE CV LAB;  Service: Cardiovascular;  Laterality: N/A;  . CARDIAC CATHETERIZATION N/A 10/13/2015   Procedure: Intravascular Ultrasound/IVUS;  Surgeon: Corky Crafts, MD;  Location: [facility] INVASIVE CV LAB;  Service: Cardiovascular;  Laterality: N/A;  . CORONARY ANGIOPLASTY WITH STENT PLACEMENT  10/13/2015  . I&D EXTREMITY Right 09/19/2015   Procedure: IRRIGATION AND DEBRIDEMENT ANKLE LACERATIONS POSSIBLE TENDON REPAIR;  Surgeon: Cammy Copa, MD;  Location: [facility] OR;  Service: Orthopedics;  Laterality: Right;  . I&D EXTREMITY Right 10/14/2015   Procedure: IRRIGATION AND DEBRIDEMENT EXTREMITY/Right Ankle;  Surgeon: Cammy Copa, MD;  Location: [facility] OR;  Service: Orthopedics;  Laterality: Right;       Home Medications    Prior to Admission medications   Medication Sig Start Date End Date Taking? Authorizing Provider  albuterol (VENTOLIN HFA) 108 (90 Base) MCG/ACT inhaler Inhale 2 puffs into the lungs every 6 (six) hours as needed for wheezing or shortness of breath. 09/26/16   Jaclyn Shaggy, MD  aspirin EC 81 MG tablet Take 1 tablet (81 mg total) by mouth daily. 04/18/16   Pete Glatter, MD  atorvastatin (LIPITOR) 20 MG tablet Take 1 tablet (20 mg  total) by mouth daily. 06/22/16 09/20/16  Lars MassonNelson, Katarina H, MD  carbamazepine (TEGRETOL) 200 MG tablet Take 200 mg by mouth at bedtime.    [provider]  carbamide peroxide (DEBROX) 6.5 % OTIC solution Place 5 drops into the left ear 2 (two) times daily. 08/13/16   Funches, Gerilyn NestleJosalyn, MD  clopidogrel (PLAVIX) 75 MG tablet Take 1 tablet (75 mg total) by mouth daily. 04/18/16   Pete GlatterLangeland, Dawn T, MD  erythromycin ophthalmic ointment Place a 1/2 inch ribbon of ointment into the lower  eyelid 4 times per day for the next 7 days. 12/11/16   Long, Arlyss RepressJoshua G, MD  fluticasone (FLONASE) 50 MCG/ACT nasal spray Place 2 sprays into both nostrils daily. 04/18/16   Pete GlatterLangeland, Dawn T, MD  gabapentin (NEURONTIN) 600 MG tablet Take 1 tablet (600 mg total) by mouth 3 (three) times daily. 11/09/16   Marcello FennelPatel, Ankit Anil, MD  loratadine (CLARITIN) 10 MG tablet Take 1 tablet (10 mg total) by mouth daily. 04/18/16   Pete GlatterLangeland, Dawn T, MD  metoprolol succinate (TOPROL XL) 25 MG 24 hr tablet Take 0.5 tablets (12.5 mg total) by mouth daily. 07/20/16   Jaclyn ShaggyAmao, Enobong, MD  nitroGLYCERIN (NITROSTAT) 0.4 MG SL tablet Place 1 tablet (0.4 mg total) under the tongue every 5 (five) minutes x 3 doses as needed for chest pain. 10/15/15   Cammy Copaean, Gregory Scott, MD  OLANZapine (ZYPREXA) 10 MG tablet Take 10 mg by mouth at bedtime.    [provider]  terbinafine (LAMISIL) 250 MG tablet Take 1 tablet (250 mg total) by mouth daily. 11/01/16   Jaclyn ShaggyAmao, Enobong, MD  VOLTAREN 1 % GEL APPLY 2 GRAMS TOPICALLY 4 TIMES DAILY 10/18/16   Marcello FennelPatel, Ankit Anil, MD    Family History No family history on file.  Social History Social History   Tobacco Use  . Smoking status: Never Smoker  . Smokeless tobacco: Never Used  Substance Use Topics  . Alcohol use: Yes  . Drug use: Yes    Types: Cocaine, Marijuana    Comment: 10/13/2015 "I've tried alot; I don't have any habits"     Allergies   Catfish [fish allergy]; Heparin; and Pork-derived products   Review of Systems Review of Systems  Unable to perform ROS: Mental status change     Physical Exam Updated Vital Signs BP 128/89   Pulse 83   Temp (!) 97.5 F (36.4 C) (Rectal)   Resp 11   Ht 6\' 7"  (2.007 m)   Wt 95.3 kg (210 lb)   SpO2 100%   BMI 23.66 kg/m   Physical Exam  Constitutional: He appears well-developed and well-nourished.  HENT:  Head: Normocephalic and atraumatic.  Eyes: EOM are normal. Pupils are equal, round, and reactive to light.  Neck:  Normal range of motion. Neck supple. No spinous process tenderness present. No neck rigidity.  Cardiovascular: Normal rate, regular rhythm, S1 normal, S2 normal, normal heart sounds, intact distal pulses and normal pulses. Exam reveals no gallop and no friction rub.  No murmur heard. Pulmonary/Chest: Effort normal and breath sounds normal. No tachypnea. He has no decreased breath sounds. He has no rhonchi.  Abdominal: Soft. He exhibits no distension. There is no tenderness.  Musculoskeletal:  No trauma noted to the extremities.  Neurological: He is alert. He is disoriented. No cranial nerve deficit. GCS eye subscore is 4. GCS verbal subscore is 4. GCS motor subscore is 5.  Moving all extremities appropriately.  Skin: Skin is warm. Capillary refill takes less  than 2 seconds.  Nursing note and vitals reviewed.    ED Treatments / Results  Labs (all labs ordered are listed, but only abnormal results are displayed) Labs Reviewed  COMPREHENSIVE METABOLIC PANEL - Abnormal; Notable for the following components:      Result Value   Creatinine, Ser 1.33 (*)    AST 48 (*)    Total Bilirubin 1.5 (*)    All other components within normal limits  AMMONIA - Abnormal; Notable for the following components:   Ammonia 42 (*)    All other components within normal limits  ACETAMINOPHEN LEVEL - Abnormal; Notable for the following components:   Acetaminophen (Tylenol), Serum <10 (*)    All other components within normal limits  CBC WITH DIFFERENTIAL/PLATELET  TSH  ETHANOL  RAPID URINE DRUG SCREEN, HOSP PERFORMED    EKG  EKG Interpretation  Date/Time:  Friday December 23 2016 19:13:17 EST Ventricular Rate:  85 PR Interval:    QRS Duration: 97 QT Interval:  371 QTC Calculation: 442 R Axis:   76 Text Interpretation:  Sinus rhythm Probable left atrial enlargement Left ventricular hypertrophy No significant change since last tracing Confirmed by Lorre Nick ([id number]) on 12/23/2016 7:41:36 PM         Radiology Ct Head Wo Contrast  Result Date: 12/23/2016 CLINICAL DATA:  Altered level of consciousness. Per technologist/ED notes: patient was found walking aimlessly around a craft store, patient was stumbling and mumbling "pain meds". EXAM: CT HEAD WITHOUT CONTRAST TECHNIQUE: Contiguous axial images were obtained from the base of the skull through the vertex without intravenous contrast. COMPARISON:  Head CT 12/07/2015 FINDINGS: Brain: No intracranial hemorrhage, mass effect, or midline shift. No hydrocephalus. The basilar cisterns are patent. No evidence of territorial infarct or acute ischemia. No extra-axial or intracranial fluid collection. Vascular: No hyperdense vessel or unexpected calcification. Skull: No fracture or focal lesion. Sinuses/Orbits: Mucous retention cyst in the left maxillary sinus. No acute finding. Other: None. IMPRESSION: No acute intracranial abnormality. Electronically Signed   By: Rubye Oaks M.D.   On: 12/23/2016 20:35    Procedures Procedures (including critical care time)  Medications Ordered in ED Medications  LORazepam (ATIVAN) injection 1 mg (1 mg Intravenous Given 12/23/16 1912)     Initial Impression / Assessment and Plan / ED Course  I have reviewed the triage vital signs and the nursing notes.  Pertinent labs & imaging results that were available during my care of the patient were reviewed by me and considered in my medical decision making (see chart for details).     46 year old male with the above history presenting with altered mental status.  He is afebrile and hemodynamically stable.  No signs of trauma on exam.  ABCs intact.  No signs of meningismus.  Concern for acute psychosis versus intoxication.  CBC, CMP, ammonia, TSH, ethanol, acetaminophen level, UDS ordered.  CT head ordered.   While attempting to get blood work patient became agitated requiring restraints and IV Ativan.  Blood work was then obtained CT head obtained.  CT head  without acute abnormality. Labs grossly unremarkable other than a mildly elevated ammonia and mildly elevated creatinine.  As he is afebrile and hemodynamically stable though and otherwise well-appearing, low suspicion for meningismus or infection.  He has been evaluated multiple times for psychosis and acute drug intoxication.  Cannot determine which is causing his altered mental status at this time.  Patient discussed with family medicine and they will admit for further workup of altered  mental status.   Final Clinical Impressions(s) / ED Diagnoses   Final diagnoses:  Altered mental status, unspecified altered mental status type    ED Discharge Orders    None       Larina Lieurance Italyhad, MD 12/23/16 2233

## 2016-12-23 NOTE — H&P (Signed)
Family Medicine Teaching [facility] Admission History and Physical Service Pager: [phone]  Patient name: Brett Salinas Medical record number: [id number] Date of birth: 08/05/1970 Age: 46 y.o. Gender: male  Primary Care Provider: Jaclyn ShaggyAmao, Enobong, MD Consultants: None Code Status: FULL  Chief Complaint:  Altered Mental Status  Assessment and Plan: Brett Salinas is a 46 y.o. male presenting with altered mental status. PMH is significant for Schizophrenia, HTN, Hyperlipidemia, CAD s/p stent in 10/2015.  AMS: Patient reported presented to a craft store and was talking incoherently and mumbling and reportedly was mentioning "pain meds". Patient was fairly compliant with exam but is unable to maintain conscious or follow directions. History per ED is that he is homeless and was found wandering around a store. CT scan negative. ETOH <10, Tylenol <10. Have not obtained UDS yet as patient has not urinated. Doubt infectious etiology, as no obvious source of infection, no leukocytosis, and afebrile. TSH and ammonia also WNL. Glucose 88 on presentation, so hypoglycemia unlikely. Etiology is likely intoxication vs withdrawal from either opioid or possibly cocaine. Patient does have a history of cocaine abuse. - Admit to FPTS, attending Dr. McDiarmid - CIWA and COWS protocol - UDS pending - Folate and B12 levels pending - Electrolytes, Calcium,  - Ativan 1mg  IV PRN q6 for agitation - NPO until return to baseline mental status - IVF maintenance fluids @125mL /hr  Schizophrenia: Patient does not appear to be having a manic episode as he is reasonably cooperative but unable to maintain conscious state or concentration for very long. Per ED he did get somewhat agitated so his current state could be due to receiving the Ativan. - Holding Olanzapine 10mg  and Carbamazepine 200mg  for now until return to baseline mental status - Consider Psych consult if patient baseline mental status does not improve  HTN:  BP is well controlled at the moment. He has no medications listed that he has been on chronically. - Continue to monitor and consider ACEI/ARB vs Amlodipine if BP is consistently >140/90  HLD: Patient has history of hyperlipemia on atorvastatin.  - Holding Atorvastatin 40mg  for now until AMS is resolved. Resume once he is returned to baseline.  CAD: Patient has history of NSTEMI with stent placed in 10/2015 and at some point was on Plavix.  - Stopping Plavix as patient has had stent for over one year - Hold Asprin 81mg  for now until return to baseline mental status.  FEN/GI: NPO until improvement in mental status; NS@125cc /hr Prophylaxis: Lovenox  Disposition: admit to floor status  History of Present Illness:  Brett Salinas is a 46 y.o. male presenting with altered mental status. He is able to state his name but unable to say where he is, how he got to the hospital, or to correctly identify the year. History is from the ED team. He was reportedly picked up from a craft store after he was found wandering around and talking incoherently. It was reported that he was mentioning "pain medications". At this time patient is somnolent but has brief unsustained episodes of awareness. During these brief episodes he appears to have uncontrolled spasms of his whole body but still is able to respond to voice commands with simple phrases. He mostly says "yes sir" or "no sir". He also responds to touch stimuli and did say that he was in pain but was unable to say where. He is unable to answer any medical questions with any reliability at this time.  Review Of Systems: Per HPI with the following additions:  See HPI. ROS not attainable as patient is not reliable historian at this time.  ROS  Patient Active Problem List   Diagnosis Date Noted  . Right knee pain 07/20/2016  . Essential hypertension 11/30/2015  . CAD -S/P PCI LAD/DES 10/13/15 10/14/2015  . Wound infection- for I&D achilles wound 10/14/15  10/14/2015  . Cocaine abuse-drug sceen positive 10/14/2015  . Schizoaffective disorder, bipolar type (HCC) 10/14/2015  . Infection 10/14/2015  . NSTEMI (non-ST elevated myocardial infarction) (HCC)   . Chest pain 10/12/2015  . Achilles tendon rupture-surgery 09/19/15 09/19/2015    Past Medical History: Past Medical History:  Diagnosis Date  . Arthritis    "hips, knees, back" (10/13/2015)  . Bipolar disorder (HCC)   . CHF (congestive heart failure) (HCC)   . Coronary artery disease   . Depression   . DVT (deep venous thrombosis) (HCC) 09/2015   right  . Heart murmur   . High cholesterol   . Hypertension   . Myocardial infarction (HCC) 10/12/2015 X 2-3  . Rectal myiasis   . Schizophrenia (HCC)   . Sciatica     Past Surgical History: Past Surgical History:  Procedure Laterality Date  . CARDIAC CATHETERIZATION N/A 10/13/2015   Procedure: Left Heart Cath and Coronary Angiography;  Surgeon: Corky CraftsJayadeep S Varanasi, MD;  [facility];  Service: Cardiovascular;  Laterality: N/A;  . CARDIAC CATHETERIZATION N/A 10/13/2015   Procedure: Coronary Stent Intervention;  Surgeon: Corky CraftsJayadeep S Varanasi, MD;  [facility];  Service: Cardiovascular;  Laterality: N/A;  . CARDIAC CATHETERIZATION N/A 10/13/2015   Procedure: Intravascular Ultrasound/IVUS;  Surgeon: Corky CraftsJayadeep S Varanasi, MD;  [facility];  Service: Cardiovascular;  Laterality: N/A;  . CORONARY ANGIOPLASTY WITH STENT PLACEMENT  10/13/2015  . I&D EXTREMITY Right 09/19/2015   Procedure: IRRIGATION AND DEBRIDEMENT ANKLE LACERATIONS POSSIBLE TENDON REPAIR;  Surgeon: Cammy CopaScott Gregory Dean, MD;  Location: [facility] OR;  Service: Orthopedics;  Laterality: Right;  . I&D EXTREMITY Right 10/14/2015   Procedure: IRRIGATION AND DEBRIDEMENT EXTREMITY/Right Ankle;  Surgeon: Cammy CopaScott Gregory Dean, MD;  Location: [facility] OR;  Service: Orthopedics;  Laterality: Right;    Social History: Social History   Tobacco Use  . Smoking  status: Never Smoker  . Smokeless tobacco: Never Used  Substance Use Topics  . Alcohol use: Yes  . Drug use: Yes    Types: Cocaine, Marijuana    Comment: 10/13/2015 "I've tried alot; I don't have any habits"   Additional social history: Per ED patient is homeless Please also refer to relevant sections of EMR.  Family History: No family history on file. Patient is altered mental status and unable to answer questions at this time.  Allergies and Medications: Allergies  Allergen Reactions  . 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 Heparin Itching    Full-body itching inc throat  . Pork-Derived Products Itching    Burning/itching   No current facility-administered medications on file prior to encounter.    Current Outpatient Medications on File Prior to Encounter  Medication Sig Dispense Refill  . albuterol (VENTOLIN HFA) 108 (90 Base) MCG/ACT inhaler Inhale 2 puffs into the lungs every 6 (six) hours as needed for wheezing or shortness of breath. 54 g 3  . amoxicillin-clavulanate (AUGMENTIN) 875-125 MG tablet Take 1 tablet by mouth 2 (two) times daily. One po bid x 7 days 14 tablet 0  . aspirin EC 81 MG tablet Take 1 tablet (81 mg total) by mouth daily. 90 tablet 3  .  atorvastatin (LIPITOR) 20 MG tablet Take 1 tablet (20 mg total) by mouth daily. 90 tablet 2  . carbamazepine (TEGRETOL) 200 MG tablet Take 200 mg by mouth at bedtime.    . carbamide peroxide (DEBROX) 6.5 % OTIC solution Place 5 drops into the left ear 2 (two) times daily. 15 mL 0  . clopidogrel (PLAVIX) 75 MG tablet Take 1 tablet (75 mg total) by mouth daily. 90 tablet 3  . erythromycin ophthalmic ointment Place a 1/2 inch ribbon of ointment into the lower eyelid 4 times per day for the next 7 days. 1 g 0  . fluticasone (FLONASE) 50 MCG/ACT nasal spray Place 2 sprays into both nostrils daily. 16 g 6  . gabapentin (NEURONTIN) 600 MG tablet Take 1 tablet (600 mg total) by mouth 3 (three) times daily. 90 tablet 1  .  loratadine (CLARITIN) 10 MG tablet Take 1 tablet (10 mg total) by mouth daily. 30 tablet 11  . metoprolol succinate (TOPROL XL) 25 MG 24 hr tablet Take 0.5 tablets (12.5 mg total) by mouth daily. 45 tablet 3  . nitroGLYCERIN (NITROSTAT) 0.4 MG SL tablet Place 1 tablet (0.4 mg total) under the tongue every 5 (five) minutes x 3 doses as needed for chest pain. 30 tablet 12  . OLANZapine (ZYPREXA) 10 MG tablet Take 10 mg by mouth at bedtime.    . terbinafine (LAMISIL) 250 MG tablet Take 1 tablet (250 mg total) by mouth daily. 30 tablet 1  . VOLTAREN 1 % GEL APPLY 2 GRAMS TOPICALLY 4 TIMES DAILY 100 g 1    Objective: BP 121/81   Pulse 81   Temp (!) 97.5 F (36.4 C) (Rectal)   Resp 14   Ht 6\' 7"  (2.007 m)   Wt 210 lb (95.3 kg)   SpO2 100%   BMI 23.66 kg/m  Exam: General: Oriented x 1, somnolent; NAD Eyes: PEERL though pupils sluggish in response; conjunctival injection bilaterally ENTM: normal pharngeal mucosa, no exudates Cardiovascular: RRR, Normal S1, S2, no murmurs Respiratory: CTAB in frontal lung fields Gastrointestinal: non-distended, TTP in all quadrants, +bs, no hepatomegaly MSK: uncontrolled periodic spastic movements of all limbs and neck Derm: warm, dry, intact, no rashes Neuro: unable to assess as patient unable to follow commands  Labs and Imaging: CBC BMET  Recent Labs  Lab 12/23/16 1908  WBC 7.0  HGB 14.3  HCT 42.0  PLT 229   Recent Labs  Lab 12/23/16 1908  NA 138  K 4.4  CL 102  CO2 27  BUN 19  CREATININE 1.33*  GLUCOSE 88  CALCIUM 9.7     11/23 - UDS: pending 11/23 - Folate/B12: pending 11/23 - Ethanol <10 11/23 - TSH: 1.1214 11/23 - Ammonia: 42 11/23 - CT Head w/o Contrast: IMPRESSION: No acute intracranial abnormality.  Arlyce Harman, DO 12/23/2016, 11:17 PM PGY-1, [facility] FPTS Intern pager: [phone], text pages welcome  UPPER LEVEL ADDENDUM  I have read the above note and made revisions highlighted in  orange.  Tarri Abernethy, MD, MPH PGY-3 [facility] Pager [phone]

## 2016-12-23 NOTE — ED Notes (Signed)
Pt. Returned from MRI via stretcher. Restraints adjusted.

## 2016-12-23 NOTE — ED Triage Notes (Signed)
Per [facility] patient was found walking aimlessly around a craft store, patient was stumbling and mumbling "pain meds". 12 lead unremarkable. Pupils equal and reactive. No incontinentence. Patient responds to voice and minimally able to follow commands.

## 2016-12-23 NOTE — ED Notes (Signed)
Per nurse hold off on drawing addl labs.  Nurse is calling report and pt will be going to inpatient floor.

## 2016-12-24 ENCOUNTER — Encounter (HOSPITAL_COMMUNITY): Payer: Self-pay | Admitting: Family Medicine

## 2016-12-24 DIAGNOSIS — R4689 Other symptoms and signs involving appearance and behavior: Secondary | ICD-10-CM | POA: Diagnosis present

## 2016-12-24 DIAGNOSIS — R451 Restlessness and agitation: Secondary | ICD-10-CM | POA: Diagnosis present

## 2016-12-24 DIAGNOSIS — R4182 Altered mental status, unspecified: Secondary | ICD-10-CM

## 2016-12-24 LAB — CREATININE, SERUM
CREATININE: 1.36 mg/dL — AB (ref 0.61–1.24)
GFR calc Af Amer: >60 mL/min (ref >60)

## 2016-12-24 LAB — VITAMIN B12: VITAMIN B 12: 306 pg/mL (ref 180–914)

## 2016-12-24 LAB — HIV ANTIBODY (ROUTINE TESTING W REFLEX): HIV SCREEN 4TH GENERATION: NONREACTIVE

## 2016-12-24 MED ORDER — OLANZAPINE 5 MG PO TBDP
10.0000 mg | ORAL_TABLET | Freq: Every day | ORAL | Status: DC
  Administered 2016-12-24 – 2016-12-27 (×4): 10 mg via ORAL
  Filled 2016-12-24 (×5): qty 2

## 2016-12-24 MED ORDER — CLOPIDOGREL BISULFATE 75 MG PO TABS: 75.0000 mg | ORAL_TABLET | Freq: Every day | ORAL | Status: DC

## 2016-12-24 MED ORDER — OLANZAPINE 10 MG IM SOLR: 5.0000 mg | Freq: Once | INTRAMUSCULAR | Status: DC

## 2016-12-24 MED ORDER — OLANZAPINE 10 MG PO TABS: 10.0000 mg | ORAL_TABLET | Freq: Every day | ORAL | Status: DC

## 2016-12-24 MED ORDER — GABAPENTIN 600 MG PO TABS
300.0000 mg | ORAL_TABLET | Freq: Three times a day (TID) | ORAL | Status: DC
  Administered 2016-12-25 – 2016-12-28 (×9): 300 mg via ORAL
  Filled 2016-12-24 (×11): qty 1

## 2016-12-24 MED ORDER — ATORVASTATIN CALCIUM 20 MG PO TABS
20.0000 mg | ORAL_TABLET | Freq: Every day | ORAL | Status: DC
  Administered 2016-12-25 – 2016-12-27 (×2): 20 mg via ORAL
  Filled 2016-12-24 (×4): qty 1

## 2016-12-24 MED ORDER — ASPIRIN EC 81 MG PO TBEC
81.0000 mg | DELAYED_RELEASE_TABLET | Freq: Every day | ORAL | Status: DC
  Administered 2016-12-25 – 2016-12-28 (×4): 81 mg via ORAL
  Filled 2016-12-24 (×4): qty 1

## 2016-12-24 MED ORDER — METOPROLOL SUCCINATE ER 25 MG PO TB24
12.5000 mg | ORAL_TABLET | Freq: Every day | ORAL | Status: DC
  Administered 2016-12-25 – 2016-12-28 (×3): 12.5 mg via ORAL
  Filled 2016-12-24 (×4): qty 1

## 2016-12-24 NOTE — Progress Notes (Signed)
PT has not voided since arrival to floor. Attempted bladder scan. Pt got agitated and told RN to leave. MD notified. Stated that if pt has not voided by the time MD rounds they will address it.

## 2016-12-24 NOTE — Progress Notes (Signed)
Family Medicine Teaching Service Daily Progress Note Intern Pager: [phone]  Patient name: Brett BinetDerrick Salinas Medical record number: [id number] Date of birth: 04/03/1970 Age: 46 y.o. Gender: male  Primary Care Provider: Jaclyn ShaggyAmao, Enobong, MD Consultants: None Code Status: Full  Pt Overview and Major Events to Date:  Patient was found with AMS outside of craft store and brought to the ED. Patient has been somewhat agitated but is able to respond to verbal commands. CT Head was negative for any fractures, hemorrhages, or any acute process.  Assessment and Plan:  AMS: Patient reportedly presented to a craft store and was talking incoherently and mumbling and reportedly was mentioning "pain meds"; history per ED. CT scan negative. ETOH <10, Tylenol <10. Have not obtained UDS yet as patient has not urinated. Doubt infectious etiology, as no obvious source of infection, no leukocytosis, and afebrile. TSH and ammonia also WNL. Glucose 88 on presentation, so hypoglycemia unlikely. Etiology is likely intoxication vs withdrawal from either opioid or possibly cocaine. Patient does have a history of cocaine abuse. - Continue CIWA and COWS protocol - UDS pending - Folate pending and B12 normal - Ativan 1mg  IV PRN q6 for agitation - NPO until return to baseline mental status - IVF maintenance fluids @125mL /hr - Tomorrow am BMP, CBC  Schizophrenia: Patient condition is complicated but what is suspected to be an acute withdrawl. However, he is more responsive to questions but he now seems to be slightly more emotionally liable and slightly more agitated. He did endorse my presence at his bedside as a delusion. - Holding Olanzapine 10mg  and Carbamazepine 200mg  for now until return to baseline mental status - Consider Psych consult if patient baseline mental status does not improve.  HTN: BP is well controlled at the moment. He has no medications listed that he has been on chronically. - Continue to monitor and  consider ACEI/ARB vs Amlodipine if BP is consistently >140/90  HLD: Patient has history of hyperlipemia on atorvastatin.  - Holding Atorvastatin 40mg  for now until AMS is resolved. Resume once he is returned to baseline.  CAD: Patient has history of NSTEMI with stent placed in 10/2015 and at some point was on Plavix.  - Stopping Plavix as patient has had stent for over one year - Hold Asprin 81mg  for now until return to baseline mental status.  FEN/GI: NPO until mental status improves PPx: Lovenox  Disposition: stable  Subjective:  This morning patient is resting comfortably in bed and is more responsive. He is still only alert and oriented x 1 and his responses are brief. He did not become agitated but refused a physical exam and said "I want you away from my bed" repeated by "you don't exist". He did endorse having pain "everywhere" and still could not answer questions about how he got to the hospital or what happened to him.  Objective: Temp:  [97.1 F (36.2 C)-97.8 F (36.6 C)] 97.6 F (36.4 C) (11/24 0643) Pulse Rate:  [68-99] 71 (11/24 0643) Resp:  [11-26] 14 (11/24 0643) BP: (121-169)/(80-119) 122/80 (11/24 0643) SpO2:  [98 %-100 %] 100 % (11/24 0643) Weight:  [210 lb (95.3 kg)] 210 lb (95.3 kg) (11/23 1829) Physical Exam: General: Alert and oriented x 1; NAD Cardiovascular: unable to assess due to patient being non-cooperative Respiratory: unable to assess due to patient being non-cooperative Abdomen: unable to assess due to patient being non-cooperative Extremities: unable to assess due to patient being non-cooperative  Laboratory: Recent Labs  Lab 12/23/16 1908  WBC 7.0  HGB 14.3  HCT 42.0  PLT 229   Recent Labs  Lab 12/23/16 1908 12/23/16 2339  NA 138  --   K 4.4  --   CL 102  --   CO2 27  --   BUN 19  --   CREATININE 1.33* 1.36*  CALCIUM 9.7  --   PROT 7.2  --   BILITOT 1.5*  --   ALKPHOS 68  --   ALT 30  --   AST 48*  --   GLUCOSE 88  --      11/23 - UDS: pending 11/23 - Acetaminophen: <10 11/23 - Ethanol <10 11/23 - TSH: 1.1214 11/23 - Ammonia: 42 11/23 - Vit B12: 306 11/23 - Folate: pending  Imaging/Diagnostic Tests:  11/23 - CT Head: IMPRESSION: No acute intracranial abnormality.  Arlyce HarmanLockamy, Sebastain Fishbaugh, DO 12/24/2016, 8:53 AM PGY-1,  Family Medicine FPTS Intern pager: [phone], text pages welcome

## 2016-12-24 NOTE — Progress Notes (Signed)
Patient bladder scanned. Patient has greater than 224 ml in bladder. Patient is agitated. Patient states that he has not voided.

## 2016-12-25 LAB — CBC
HCT: 40.3 % (ref 39.0–52.0)
HEMOGLOBIN: 13.4 g/dL (ref 13.0–17.0)
MCH: 29.5 pg (ref 26.0–34.0)
MCHC: 33.3 g/dL (ref 30.0–36.0)
MCV: 88.8 fL (ref 78.0–100.0)
PLATELETS: 183 10*3/uL (ref 150–400)
RBC: 4.54 MIL/uL (ref 4.22–5.81)
RDW: 12.9 % (ref 11.5–15.5)
WBC: 4.1 10*3/uL (ref 4.0–10.5)

## 2016-12-25 LAB — BASIC METABOLIC PANEL
ANION GAP: 5 (ref 5–15)
BUN: 20 mg/dL (ref 6–20)
CHLORIDE: 107 mmol/L (ref 101–111)
CO2: 27 mmol/L (ref 22–32)
CREATININE: 1.42 mg/dL — AB (ref 0.61–1.24)
Calcium: 8.7 mg/dL — ABNORMAL LOW (ref 8.9–10.3)
GFR calc non Af Amer: 58 mL/min — ABNORMAL LOW (ref >60)
Glucose, Bld: 101 mg/dL — ABNORMAL HIGH (ref 65–99)
Potassium: 3.7 mmol/L (ref 3.5–5.1)
SODIUM: 139 mmol/L (ref 135–145)

## 2016-12-25 NOTE — Progress Notes (Signed)
Pt is very sleepy. Pt have not voided all shift long. Pt was bladder scanned at 5 am, was found. Pt was advised to try and pee. Pt says he does not feel like peeing. Pt was educated on in and out catheterization. Pt says nothing in response. MD on call was notified. He advised to continue to monitor and it would be discussed and addressed during the day rounding. Will continue to monitor.

## 2016-12-25 NOTE — Progress Notes (Signed)
Family Medicine Teaching Service Daily Progress Note Intern Pager: [phone]  Patient name: Brett Salinas Medical record number: [id number] Date of birth: 12/31/1970 Age: 46 y.o. Gender: male  Primary Care Provider: Jaclyn ShaggyAmao, Enobong, MD Consultants: None Code Status: Full  Pt Overview and Major Events to Date:  Patient was found with AMS outside of craft store and brought to the ED. Patient has been somewhat agitated but is able to respond to verbal commands. CT Head was negative for any fractures, hemorrhages, or any acute process.  Assessment and Plan:  AMS: Patient still unable to orient to place or time. Questionable if he can orient to person.  His initial symptoms were more consistent with acute withdrawal but now have developed into a more acute psychosis picture.  CT scan negative. Have not obtained UDS yet as patient has not urinated. Patient does have a history of cocaine abuse. - Continue CIWA and COWS protocol - UDS pending - Folate pending  AKI: Patient has baseline Creatinine of 1.30 and currently has Cr of 1.42. Likely due to poor intake of fluids as well as possible polysubstance abuse. - Continue to monitor with morning BMP. Consider restarting 1/2 maintenance IVFs if it does not improve with oral hydration.   Schizophrenia: Patient condition is complicated but was initially presenting more like an acute withdrawal but has now developed more into an acute psychosis picture. He is more responsive to questions but he now seems to be slightly more emotionally liable and slightly more agitated. - Continue Olanzapine 10mg  - Psych consulted, appreciate recs.  HTN: BP is well controlled at the moment. He has no medications listed that he has been on chronically. - Continue to monitor and consider ACEI/ARB vs Amlodipine if BP is consistently >140/90 - on metoprolol 12.5mg   HLD: Patient has history of hyperlipemia on atorvastatin.  - Resume Atorvastatin 40mg .  CAD: Patient  has history of NSTEMI with stent placed in 10/2015 and at some point was on Plavix.  - Stopping Plavix as patient has had stent for over one year. - Resume Asprin 81mg  for now until return to baseline mental status.  FEN/GI: NPO until mental status improves PPx: Lovenox  Disposition: stable  Subjective:  This morning patient is resting comfortably in bed and is more responsive. He is still only alert and oriented x 1 and his responses are brief. He did not become agitated but refused a physical exam and said "I want you away from my bed" repeated by "you don't exist". He did endorse having pain "everywhere" and still could not answer questions about how he got to the hospital or what happened to him.  Objective: Temp:  [97.6 F (36.4 C)-97.8 F (36.6 C)] 97.8 F (36.6 C) (11/25 0601) Pulse Rate:  [71-73] 71 (11/25 0601) Resp:  [16-19] 19 (11/25 0601) BP: (89-104)/(56-66) 104/66 (11/25 0601) SpO2:  [98 %-100 %] 99 % (11/25 0601) Physical Exam: General: Alert and oriented x 1; NAD Cardiovascular: unable to assess due to patient being non-cooperative Respiratory: unable to assess due to patient being non-cooperative Abdomen: unable to assess due to patient being non-cooperative Extremities: unable to assess due to patient being non-cooperative  Laboratory: Recent Labs  Lab 12/23/16 1908 12/25/16 0342  WBC 7.0 4.1  HGB 14.3 13.4  HCT 42.0 40.3  PLT 229 183   Recent Labs  Lab 12/23/16 1908 12/23/16 2339 12/25/16 0342  NA 138  --  139  K 4.4  --  3.7  CL 102  --  107  CO2 27  --  27  BUN 19  --  20  CREATININE 1.33* 1.36* 1.42*  CALCIUM 9.7  --  8.7*  PROT 7.2  --   --   BILITOT 1.5*  --   --   ALKPHOS 68  --   --   ALT 30  --   --   AST 48*  --   --   GLUCOSE 88  --  101*    11/23 - UDS: pending 11/23 - Acetaminophen: <10 11/23 - Ethanol <10 11/23 - TSH: 1.1214 11/23 - Ammonia: 42 11/23 - Vit B12: 306 11/23 - Folate: pending  Imaging/Diagnostic  Tests:  11/23 - CT Head: IMPRESSION: No acute intracranial abnormality.  Arlyce HarmanLockamy, Delylah Stanczyk, DO 12/25/2016, 9:01 AM PGY-1, [facility] FPTS Intern pager: [phone], text pages welcome

## 2016-12-26 ENCOUNTER — Other Ambulatory Visit: Payer: Self-pay

## 2016-12-26 ENCOUNTER — Encounter (HOSPITAL_COMMUNITY): Payer: Self-pay | Admitting: Psychiatry

## 2016-12-26 DIAGNOSIS — F22 Delusional disorders: Secondary | ICD-10-CM

## 2016-12-26 DIAGNOSIS — F1490 Cocaine use, unspecified, uncomplicated: Secondary | ICD-10-CM

## 2016-12-26 DIAGNOSIS — R4689 Other symptoms and signs involving appearance and behavior: Secondary | ICD-10-CM

## 2016-12-26 DIAGNOSIS — F1290 Cannabis use, unspecified, uncomplicated: Secondary | ICD-10-CM

## 2016-12-26 DIAGNOSIS — R451 Restlessness and agitation: Secondary | ICD-10-CM

## 2016-12-26 LAB — BASIC METABOLIC PANEL
ANION GAP: 4 — AB (ref 5–15)
BUN: 16 mg/dL (ref 6–20)
CHLORIDE: 107 mmol/L (ref 101–111)
CO2: 28 mmol/L (ref 22–32)
Calcium: 8.8 mg/dL — ABNORMAL LOW (ref 8.9–10.3)
Creatinine, Ser: 1.21 mg/dL (ref 0.61–1.24)
GFR calc non Af Amer: >60 mL/min (ref >60)
Glucose, Bld: 114 mg/dL — ABNORMAL HIGH (ref 65–99)
POTASSIUM: 3.6 mmol/L (ref 3.5–5.1)
SODIUM: 139 mmol/L (ref 135–145)

## 2016-12-26 LAB — FOLATE RBC
FOLATE, HEMOLYSATE: 391.2 ng/mL
Folate, RBC: 914 ng/mL (ref >498)
HEMATOCRIT: 42.8 % (ref 37.5–51.0)

## 2016-12-26 LAB — RAPID URINE DRUG SCREEN, HOSP PERFORMED
AMPHETAMINES: POSITIVE — AB
BENZODIAZEPINES: NOT DETECTED
Barbiturates: NOT DETECTED
Cocaine: NOT DETECTED
OPIATES: NOT DETECTED
TETRAHYDROCANNABINOL: NOT DETECTED

## 2016-12-26 NOTE — Progress Notes (Signed)
Family Medicine Teaching Service Daily Progress Note Intern Pager: [phone]  Patient name: Brett Salinas Medical record number: [id number] Date of birth: 07/01/1970 Age: 46 y.o. Gender: male  Primary Care Provider: Jaclyn ShaggyAmao, Enobong, MD Consultants: None Code Status: Full  Pt Overview and Major Events to Date:  Patient was found with AMS outside of craft store and brought to the ED. Patient has been somewhat agitated but is able to respond to verbal commands. CT Head was negative for any fractures, hemorrhages, or any acute process.  Assessment and Plan:  AMS: Improved. Patient able to now orient to person, place and time. His initial symptoms were more consistent with acute withdrawal but now have developed into a more acute psychosis picture, which has improved since yesterday.  CT scan negative. UDS positive for amphetamines. Patient does have a history of cocaine abuse. - Continue CIWA due to night time auditory hallucinations and agitation - Discontinue COWS protocol - UDS positive for amphetamines - Folate pending  AKI: Improved. Cr 1.33 > 1.36 > 1.42 > 1.21. Patient has baseline Creatinine of 1.30. Likely due to poor intake of fluids as well as possible polysubstance abuse. Improved since patient has increased oral intake. - Continue to monitor with morning BMP. - D/c IVFs  Schizophrenia: Patient condition has now developed into more of an acute psychosis picture. He is more responsive to questions and was alert and oriented today and did not endorse me being a delusion. Still seems slightly agitated but is cooperative. - Continue Olanzapine 10mg  - Psych consulted, appreciate recs. - Will need social work consult if he will not get Psych placement as he cannot recall where he lives or who he live with or if family is available to help.  HTN: BP is well controlled at the moment. He has no medications listed that he has been on chronically. - Continue to monitor and consider ACEI/ARB  vs Amlodipine if BP is consistently >140/90 - on metoprolol 12.5mg   HLD: Patient has history of hyperlipemia on atorvastatin.  - Cont Atorvastatin 40mg .  CAD: Patient has history of NSTEMI with stent placed in 10/2015 and at some point was on Plavix. - Cont Asprin 81mg   Onchomycosis: Patient endorses wanting a cream for his feet. He seems to think that his fungal toenail infection is causing his bilaterally foot pain. He was on terbinafine before admission but it is unclear if he was compliant with medication. - Considering his difficult social situation no clear benefit to starting any medication at this time.  FEN/GI: NPO until mental status improves PPx: Lovenox  Disposition: stable  Subjective:  This morning patient is resting comfortably in bed and is oriented to person, time, and place. He states his feet still hurt and states "its b/c no one is giving me cream for my feet." otherwise he states the medications we are giving him "make me sleepy" and that he is staying hungry. Otherwise no complaints and his overall condition is improving as his AMS is much improved. Difficult to say if he is back to baseline.  Objective: Temp:  [97.9 F (36.6 C)-98.3 F (36.8 C)] 98.3 F (36.8 C) (11/26 0639) Pulse Rate:  [66-80] 66 (11/26 0639) Resp:  [17-18] 17 (11/26 0639) BP: (110-121)/(61-74) 121/68 (11/26 0639) SpO2:  [98 %-100 %] 100 % (11/26 14780639) Physical Exam: General: Alert and oriented x 3; NAD, arousalable but somnolent   HEENT: PERRLA, EOMI Cardiovascular: RRR, normal S1, S2, no murmurs. +2 bilateral pedal pulses Respiratory: CTAB, no crackles, no wheezing  Abdomen: non-distended, non-tender, +bs in all quadrants Extremities: no cyanosis, no edema, no clubbing  Laboratory: Recent Labs  Lab 12/23/16 1908 12/25/16 0342  WBC 7.0 4.1  HGB 14.3 13.4  HCT 42.0 40.3  PLT 229 183   Recent Labs  Lab 12/23/16 1908 12/23/16 2339 12/25/16 0342 12/26/16 0343  NA 138  --  139  139  K 4.4  --  3.7 3.6  CL 102  --  107 107  CO2 27  --  27 28  BUN 19  --  20 16  CREATININE 1.33* 1.36* 1.42* 1.21  CALCIUM 9.7  --  8.7* 8.8*  PROT 7.2  --   --   --   BILITOT 1.5*  --   --   --   ALKPHOS 68  --   --   --   ALT 30  --   --   --   AST 48*  --   --   --   GLUCOSE 88  --  101* 114*    11/23 - UDS: positive for amphetamines 11/23 - Acetaminophen: <10 11/23 - Ethanol <10 11/23 - TSH: 1.1214 11/23 - Ammonia: 42 11/23 - Vit B12: 306 11/23 - Folate: pending  Imaging/Diagnostic Tests:  11/23 - CT Head: IMPRESSION: No acute intracranial abnormality.  Arlyce HarmanLockamy, Micheal Sheen, DO 12/26/2016, 7:25 AM PGY-1, [facility] FPTS Intern pager: [phone], text pages welcome

## 2016-12-26 NOTE — Progress Notes (Addendum)
   12/26/16 1004  Vitals  Temp 98.7 F (37.1 C)  Temp Source Oral  BP 111/75  MAP (mmHg) 88  BP Location Right Arm  BP Method Automatic  Patient Position (if appropriate) Sitting  Pulse Rate 81  Pulse Rate Source Dinamap  Oxygen Therapy  SpO2 99 %  O2 Device Room Air  Patient is restless this morning and reports paranoia without seeing or hearing things that are not there. He also reports that zyprexa is making him extremely sleepy and usually takes it at night. Text paged provider in regard to Toprol, and to make aware that patient refused Lipitor, atixtra, and zyprexa (takes at night).  Advised to hold Toprol, reeducate need for zyprexa and atixtra.  1:06- patient agreed to zyprexa and request more food on his trays. Service response called. He refuses IV access at this time. Provider made aware via text page.

## 2016-12-26 NOTE — Consult Note (Addendum)
[place] Psychiatry Consult   Reason for Consult:  Altered mental status Referring Physician:  Dr. McDiarmid  Patient Identification: Brett Salinas MRN:  [id number] Principal Diagnosis: Schizoaffective disorder, bipolar type [facility]) Diagnosis:   Patient Active Problem List   Diagnosis Date Noted  . Agitation [R45.1] 12/24/2016  . Aggression [R46.89] 12/24/2016  . Altered mental status [R41.82] 12/23/2016  . History of ED visit for Medical clearance for incarceration for alleged aggressive behavior and crack cocaine use [Z00.8] 09/05/2016  . Right knee pain [M25.561] 07/20/2016  . Essential hypertension [I10] 11/30/2015  . CAD -S/P PCI LAD/DES 10/13/15 [I25.10, Z98.61] 10/14/2015  . Wound infection- for I&D achilles wound 10/14/15 [T14.8XXA, L08.9] 10/14/2015  . Cocaine abuse-drug sceen positive, History of [F14.10] 10/14/2015  . Schizoaffective disorder, bipolar type ([place]) [F25.0] 10/14/2015  . Infection [B99.9] 10/14/2015  . NSTEMI (non-ST elevated myocardial infarction) ([place]) [I21.4]   . Chest pain [R07.9] 10/12/2015  . Achilles tendon rupture-surgery 09/19/15 [S86.019A] 09/19/2015    Total Time spent with patient: 1 hour  Subjective:   Brett Salinas is a 46 y.o. male patient admitted with altered mental status.  HPI:   Per chart review, patient was admitted with altered mental status. He was found outside a craft store and brought to the ED. He was somewhat agitated but was able to follow verbal commands. His mental status improved and was thought to be consistent with an acute withdrawal but it later deteriorated and he appeared psychotic. UDS is positive for amphetamines. He has a history of schizophrenia and cocaine abuse. Home medications include Zyprexa 10 mg daily and Tegretol 200 mg qhs.  On interview, Mr. Brandi reports that he was wandering for 2 days prior to coming to the hospital. He remembers feeling confused and the events leading up to his hospitalization are not  clear. He reports losing his wallet. He reports that he is running from the government and he knows that they are coming after him. They have set a plan to eliminate all people with mental illness. He is trying to use spiritual energy to prevent them from putting people in concentration camps. He reports hearing multiple voices speaking to him just prior to hospitalization. He did not know which one to "follow." They were telling him to "take out" the EMTs who transported him to the hospital. He believes that he has a headset implanted in his brain. He reports that he sees codes when asked about VH. He does not believe medications have been effective for his symptoms. He is prescribed Tegretrol and Zyprexa but reports poor compliance with these medications since they cause him to feel slowed and have low energy. He is [facility] at [facility]. He reports that he is scheduled tomorrow for mental health court. He reports poor sleep because he "does not like to sleep." He denies problems with appetite. He denies SI or HI.   Past Psychiatric History: Schizoaffective disorder, bipolar type. He reports a history of multiple suicide attempts. His last attempt was in 2006 by overdose.   Risk to Self:  None. Denies SI. Risk to Others:  None. Denies HI.  Prior Inpatient Therapy:  He was last hospitalized in 2009.  Prior Outpatient Therapy:  He is currently followed at [facility].   Past Medical History:  Past Medical History:  Diagnosis Date  . Achilles tendon rupture-surgery 09/19/15 09/19/2015  . Altered mental status 12/23/2016  . Arthritis    "hips, knees, back" (10/13/2015)  . Bipolar disorder ([place])   . CAD -S/P  PCI LAD/DES 10/13/15 10/14/2015  . CHF (congestive heart failure) ([place])   . Cocaine abuse-drug sceen positive, History of 10/14/2015  . Coronary artery disease   . Depression   . DVT (deep venous thrombosis) ([place]) 09/2015   right  . Essential hypertension 11/30/2015  . Heart murmur   . High cholesterol    . Hypertension   . Myocardial infarction ([place]) 10/12/2015 X 2-3  . NSTEMI (non-ST elevated myocardial infarction) ([place])   . Rectal myiasis   . Schizoaffective disorder, bipolar type ([place]) 10/14/2015  . Schizophrenia ([place])   . Sciatica     Past Surgical History:  Procedure Laterality Date  . CARDIAC CATHETERIZATION N/A 10/13/2015   Procedure: Left Heart Cath and Coronary Angiography;  Surgeon: Jettie Booze, MD;  Location: [place] CV [facility];  Service: Cardiovascular;  Laterality: N/A;  . CARDIAC CATHETERIZATION N/A 10/13/2015   Procedure: Coronary Stent Intervention;  Surgeon: Jettie Booze, MD;  Location: [place] [facility];  Service: Cardiovascular;  Laterality: N/A;  . CARDIAC CATHETERIZATION N/A 10/13/2015   Procedure: Intravascular Ultrasound/IVUS;  Surgeon: Jettie Booze, MD;  Location: [place] [facility];  Service: Cardiovascular;  Laterality: N/A;  . CORONARY ANGIOPLASTY WITH STENT PLACEMENT  10/13/2015  . I&D EXTREMITY Right 09/19/2015   Procedure: IRRIGATION AND DEBRIDEMENT ANKLE LACERATIONS POSSIBLE TENDON REPAIR;  Surgeon: Meredith Pel, MD;  Location: [place];  Service: Orthopedics;  Laterality: Right;  . I&D EXTREMITY Right 10/14/2015   Procedure: IRRIGATION AND DEBRIDEMENT EXTREMITY/Right Ankle;  Surgeon: Meredith Pel, MD;  Location: [place];  Service: Orthopedics;  Laterality: Right;   Family History: No family history on file. Family Psychiatric  History: Unknown  Social History:  Social History   Substance and Sexual Activity  Alcohol Use Yes     Social History   Substance and Sexual Activity  Drug Use Yes  . Types: Cocaine, Marijuana   Comment: 10/13/2015 "I've tried alot; I don't have any habits"    Social History   Socioeconomic History  . Marital status: Single    Spouse name: None  . Number of children: None  . Years of education: None  . Highest education level: None  Social Needs  . Financial resource strain: None  . Food  insecurity - worry: None  . Food insecurity - inability: None  . Transportation needs - medical: None  . Transportation needs - non-medical: None  Occupational History  . None  Tobacco Use  . Smoking status: Never Smoker  . Smokeless tobacco: Never Used  Substance and Sexual Activity  . Alcohol use: Yes  . Drug use: Yes    Types: Cocaine, Marijuana    Comment: 10/13/2015 "I've tried alot; I don't have any habits"  . Sexual activity: None  Other Topics Concern  . None  Social History Narrative  . None   Additional Social History: He has been homeless since July. He is unemployed. He is an Scientist, research (life sciences). He reports alcohol and marijuana use. His last used was 2 weeks ago. He reports that he does not smoke but his "alter ego" does smoke. UDS was positive for amphetamines.     Allergies:   Allergies  Allergen Reactions  . 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 Heparin Itching    Full-body itching inc throat  . Pork-Derived Products Itching    Burning/itching    Labs:  Results for orders placed or performed during the hospital encounter of 12/23/16 (from the past 48 hour(s))  Basic metabolic  panel     Status: Abnormal   Collection Time: 12/25/16  3:42 AM  Result Value Ref Range   Sodium 139 135 - 145 mmol/L   Potassium 3.7 3.5 - 5.1 mmol/L   Chloride 107 101 - 111 mmol/L   CO2 27 22 - 32 mmol/L   Glucose, Bld 101 (H) 65 - 99 mg/dL   BUN 20 6 - 20 mg/dL   Creatinine, Ser 1.42 (H) 0.61 - 1.24 mg/dL   Calcium 8.7 (L) 8.9 - 10.3 mg/dL   GFR calc non Af Amer 58 (L) >60 mL/min   GFR calc Af Amer >60 >60 mL/min    Comment: (NOTE) The eGFR has been calculated using the CKD EPI equation. This calculation has not been validated in all clinical situations. eGFR's persistently <60 mL/min signify possible Chronic Kidney Disease.    Anion gap 5 5 - 15  CBC     Status: None   Collection Time: 12/25/16  3:42 AM  Result Value Ref Range   WBC 4.1 4.0 - 10.5 K/uL   RBC 4.54 4.22 -  5.81 MIL/uL   Hemoglobin 13.4 13.0 - 17.0 g/dL   HCT 40.3 39.0 - 52.0 %   MCV 88.8 78.0 - 100.0 fL   MCH 29.5 26.0 - 34.0 pg   MCHC 33.3 30.0 - 36.0 g/dL   RDW 12.9 11.5 - 15.5 %   Platelets 183 150 - 400 K/uL  Rapid urine drug screen (hospital performed)     Status: Abnormal   Collection Time: 12/26/16  2:15 AM  Result Value Ref Range   Opiates NONE DETECTED NONE DETECTED   Cocaine NONE DETECTED NONE DETECTED   Benzodiazepines NONE DETECTED NONE DETECTED   Amphetamines POSITIVE (A) NONE DETECTED   Tetrahydrocannabinol NONE DETECTED NONE DETECTED   Barbiturates NONE DETECTED NONE DETECTED    Comment:        DRUG SCREEN FOR MEDICAL PURPOSES ONLY.  IF CONFIRMATION IS NEEDED FOR ANY PURPOSE, NOTIFY LAB WITHIN 5 DAYS.        LOWEST DETECTABLE LIMITS FOR URINE DRUG SCREEN Drug Class       Cutoff (ng/mL) Amphetamine      1000 Barbiturate      200 Benzodiazepine   924 Tricyclics       268 Opiates          300 Cocaine          300 THC              50   Basic metabolic panel     Status: Abnormal   Collection Time: 12/26/16  3:43 AM  Result Value Ref Range   Sodium 139 135 - 145 mmol/L   Potassium 3.6 3.5 - 5.1 mmol/L   Chloride 107 101 - 111 mmol/L   CO2 28 22 - 32 mmol/L   Glucose, Bld 114 (H) 65 - 99 mg/dL   BUN 16 6 - 20 mg/dL   Creatinine, Ser 1.21 0.61 - 1.24 mg/dL   Calcium 8.8 (L) 8.9 - 10.3 mg/dL   GFR calc non Af Amer >60 >60 mL/min   GFR calc Af Amer >60 >60 mL/min    Comment: (NOTE) The eGFR has been calculated using the CKD EPI equation. This calculation has not been validated in all clinical situations. eGFR's persistently <60 mL/min signify possible Chronic Kidney Disease.    Anion gap 4 (L) 5 - 15    Current Facility-Administered Medications  Medication Dose Route Frequency Provider Last Rate  Last Dose  . 0.9 %  sodium chloride infusion   Intravenous Continuous Lockamy, Timothy, DO   Stopped at 12/24/16 1048  . aspirin EC tablet 81 mg  81 mg Oral Daily  Rory Percy, DO   81 mg at 12/25/16 1112  . atorvastatin (LIPITOR) tablet 20 mg  20 mg Oral Daily Rory Percy, DO   20 mg at 12/25/16 1114  . folic acid (FOLVITE) tablet 1 mg  1 mg Oral Daily Lockamy, Timothy, DO   1 mg at 12/25/16 1113  . fondaparinux (ARIXTRA) injection 2.5 mg  2.5 mg Subcutaneous Daily Lockamy, Timothy, DO   2.5 mg at 12/25/16 1117  . gabapentin (NEURONTIN) tablet 300 mg  300 mg Oral TID Rory Percy, DO   300 mg at 12/26/16 0017  . LORazepam (ATIVAN) tablet 1 mg  1 mg Oral Q6H PRN Nuala Alpha, DO       Or  . LORazepam (ATIVAN) injection 1 mg  1 mg Intravenous Q6H PRN Lockamy, Timothy, DO   1 mg at 12/24/16 0950  . metoprolol succinate (TOPROL-XL) 24 hr tablet 12.5 mg  12.5 mg Oral Daily Rory Percy, DO   12.5 mg at 12/25/16 1115  . multivitamin with minerals tablet 1 tablet  1 tablet Oral Daily Lockamy, Timothy, DO   1 tablet at 12/25/16 1112  . OLANZapine zydis (ZYPREXA) disintegrating tablet 10 mg  10 mg Oral Daily Wendee Beavers T, MD   10 mg at 12/25/16 1113  . thiamine (VITAMIN B-1) tablet 100 mg  100 mg Oral Daily Lockamy, Timothy, DO   100 mg at 12/25/16 1114   Or  . thiamine (B-1) injection 100 mg  100 mg Intravenous Daily Nuala Alpha, DO        Musculoskeletal: Strength & Muscle Tone: within normal limits Gait & Station: normal Patient leans: N/A  Psychiatric Specialty Exam: Physical Exam  Nursing note and vitals reviewed. Constitutional: He is oriented to person, place, and time. He appears well-developed and well-nourished.  HENT:  Head: Normocephalic and atraumatic.  Neck: Normal range of motion.  Respiratory: Effort normal.  Musculoskeletal: Normal range of motion.  Neurological: He is alert and oriented to person, place, and time.  Skin: No rash noted.  Psychiatric: His speech is normal and behavior is normal. Thought content is paranoid and delusional. Cognition and memory are normal.    Review of Systems  Constitutional:  Negative for chills and fever.  Eyes: Positive for blurred vision.       Wears corrective lenses.   Gastrointestinal: Negative for abdominal pain, constipation, diarrhea, nausea and vomiting.    Blood pressure 121/68, pulse 66, temperature 98.3 F (36.8 C), temperature source Oral, resp. rate 17, height _0  (2.007 m), weight 95.3 kg (210 lb), SpO2 100 %.Body mass index is 23.66 kg/m.  General Appearance: Well Groomed, African American male with shaved face and low haircut who is wearing a hospital gown and lying in bed. NAD.   Eye Contact:  Good  Speech:  Clear and Coherent and Normal Rate  Volume:  Normal  Mood:  Okay  Affect:  Constricted  Thought Process:  Linear  Orientation:  Full (Time, Place, and Person)  Thought Content:  Delusions and Paranoid Ideation about a conspiracy.   Suicidal Thoughts:  No  Homicidal Thoughts:  No  Memory:  Immediate;   Good Recent;   Fair Remote;   Fair  Judgement:  Impaired  Insight:  Lacking  Psychomotor Activity:  Normal  Concentration:  Concentration: Good and Attention Span: Good  Recall:  Good  Fund of Knowledge:  Fair  Language:  Fair  Akathisia:  No  Handed:  Right  AIMS (if indicated):   N/A  Assets:  Desire for Improvement  ADL's:  Intact  Cognition:  WNL  Sleep:   Poor   Assessment:  Carnie Bruemmer is a 46 y.o. male who was admitted with altered mental status. Patient reports that he was wandering for 2 days prior to admission. He endorses delusional and paranoid thoughts about a conspiracy and AH. He reports poor compliance to psychiatric medications secondary to side effects. He is currently taking Zyprexa in the daytime and may benefit from switching it to nighttime to help with sleep and daytime sedation. He warrants inpatient psychiatric hospitalization for stabilization and treatment.   Treatment Plan Summary: -Patient warrants inpatient psychiatric hospitalization given active psychosis and high risk for inability to care  for self. -Continue home medications: Zyprexa 10 mg daily. Switch to nighttime dosing due to daytime sedation and complaints of poor sleep. Will defer starting mood stabilizer to inpatient treatment team.  -QTc 442 on 11/23. -Patient reportedly becomes agitated with certain requests for care. If he becomes agitated and treatment is not necessary then would advise to not pursue particular treatment to prevent from escalating behavior.  -Please pursue involuntary commitment if patient refuses voluntary psychiatric hospitalization or attempts to leave the hospital.  -Will sign off on patient at this time. Please consult psychiatry again as needed.    Disposition: Recommend psychiatric Inpatient admission when medically cleared.  Faythe Dingwall, DO 12/26/2016 9:36 AM

## 2016-12-27 DIAGNOSIS — F250 Schizoaffective disorder, bipolar type: Principal | ICD-10-CM

## 2016-12-27 DIAGNOSIS — F1910 Other psychoactive substance abuse, uncomplicated: Secondary | ICD-10-CM

## 2016-12-27 MED ORDER — ATORVASTATIN CALCIUM 20 MG PO TABS: 20.0000 mg | ORAL_TABLET | Freq: Every day | ORAL | Status: DC

## 2016-12-27 MED ORDER — TERBINAFINE HCL 1 % EX CREA
TOPICAL_CREAM | Freq: Every day | CUTANEOUS | Status: DC
  Administered 2016-12-27: 17:00:00 via TOPICAL
  Filled 2016-12-27: qty 12

## 2016-12-27 MED ORDER — FONDAPARINUX SODIUM 2.5 MG/0.5ML ~~LOC~~ SOLN
2.5000 mg | Freq: Every day | SUBCUTANEOUS | Status: DC
  Administered 2016-12-27: 2.5 mg via SUBCUTANEOUS
  Filled 2016-12-27 (×2): qty 0.5

## 2016-12-27 MED ORDER — OLANZAPINE 5 MG PO TBDP: 10.0000 mg | ORAL_TABLET | Freq: Every day | ORAL | Status: DC

## 2016-12-27 NOTE — Progress Notes (Addendum)
11/28: [facility] has a bed for patient today. MD confirmed that patient is voluntary at this time.   11/27: [facility].  Osborne Cascoadia Edgerrin Correia LCSWA [phone]

## 2016-12-27 NOTE — Progress Notes (Signed)
FPTS Interim Progress Note  S: Patient is sleepy and resting in his chair bedside his bed. He states he took his medications this morning and that is why he is so sleepy.  O: BP 122/76 (BP Location: Left Arm)   Pulse 76   Temp 98.3 F (36.8 C) (Oral)   Resp 16   Ht 6\' 7"  (2.007 m)   Wt 210 lb (95.3 kg)   SpO2 100%   BMI 23.66 kg/m   Gen: Alert but somnolent, NAD HEENT: EOMI, PERRLA Skin: warm, dry, intact Psych: flat affect, mood   A/P: I was checking on patient due to report from nurse that he was refusing his medications this am. It is reasonable to give him Olanzapine at night. Informed nurse to try and get him to take it around 2pm but if he still refused he has to take it tonight at bedtime and to notify us if he refuses tonight.  Arlyce HarmanLockamy, Pilar Corrales, DO 12/27/2016, 2:55 PM PGY-1, [facility] pager [phone]

## 2016-12-27 NOTE — Progress Notes (Signed)
Pt is refusing arixtra, pt educated on what drug is for, pt verbalized understanding.

## 2016-12-27 NOTE — Progress Notes (Signed)
Family Medicine Teaching Service Daily Progress Note Intern Pager: [phone]  Patient name: Brett Salinas Medical record number: [id number] Date of birth: 12/28/1970 Age: 46 y.o. Gender: male  Primary Care Provider: Jaclyn ShaggyAmao, Enobong, MD Consultants: None Code Status: Full  Pt Overview and Major Events to Date:  Patient was found with AMS outside of craft store and brought to the ED. Patient has been somewhat agitated but is able to respond to verbal commands. CT Head was negative for any fractures, hemorrhages, or any acute process.  Assessment and Plan:  AMS: Resolved. Patient able to now orient to person, place and time. His initial symptoms were more consistent with acute withdrawal but now have developed into a more acute psychosis picture, which has improved since yesterday.  CT scan negative. UDS positive for amphetamines. Patient does have a history of cocaine abuse. - Discontinue CIWA - Folate normal  AKI: Resolved. Cr 1.33 > 1.36 > 1.42 > 1.21. Patient has baseline Creatinine of 1.30. - Consider getting repeat BMP if urine output decreases  Schizophrenia: Patient has been recommended for inpatient Psych. Patient is medically stable and can be transferred to inpatient psych when a bed is available. He is actively having paranoid delusions about govt putting implants in his eyes and ears to track him. Made comments about being a "fall guy" and that he was involved in "[employer] program and [employer] govt programs". - Awaiting bed for inpatient Psych - Continue Olanzapine 10mg  - Psych consulted, needs inpatient psych care.  HTN: BP is well controlled at the moment. He has no medications listed that he has been on chronically. - Continue to monitor and consider ACEI/ARB vs Amlodipine if BP is consistently >140/90 - on metoprolol 12.5mg   HLD: Patient has history of hyperlipemia on atorvastatin.  - Cont Atorvastatin 40mg .  CAD: Patient has history of NSTEMI with stent placed in 10/2015  and at some point was on Plavix. - Cont Asprin 81mg   Onchomycosis: Patient is no longer having foot pain and had no concerns about his feet this am. - Considering his difficult social situation no clear benefit to starting any medication at this time.  FEN/GI: reg diet PPx: Lovenox  Disposition: medically stable for transfer to inpatient Psych  Subjective:  This morning patient is resting in chair. He is oriented to person, time, and place. He states he feels better today and did state if we think its [facility]. He endorses what appear to be paranoid delusions about being involved in govt conspiracy and tracking programs. He states his lower back is sore from laying in the bed so much so he is sitting in the chair.  Objective: Temp:  [98.5 F (36.9 C)-98.8 F (37.1 C)] 98.6 F (37 C) (11/27 0532) Pulse Rate:  [71-87] 71 (11/27 0532) Resp:  [16-18] 16 (11/27 0532) BP: (111-130)/(69-83) 127/80 (11/27 0532) SpO2:  [98 %-100 %] 100 % (11/27 0532) Physical Exam: General: Alert and oriented x 3; NAD, arousalable but somnolent   HEENT: PERRLA, EOMI Cardiovascular: RRR, normal S1, S2, no murmurs. +2 bilateral pedal pulses Respiratory: CTAB, no crackles, no wheezing Abdomen: non-distended, non-tender, +bs in all quadrants Extremities: no cyanosis, no edema, no clubbing Psych: denies suicidal ideation, harmful intention  Laboratory: Recent Labs  Lab 12/23/16 1908 12/23/16 2339 12/25/16 0342  WBC 7.0  --  4.1  HGB 14.3  --  13.4  HCT 42.0 42.8 40.3  PLT 229  --  183   Recent Labs  Lab 12/23/16 1908  12/23/16 2339 12/25/16 0342 12/26/16 0343  NA 138  --  139 139  K 4.4  --  3.7 3.6  CL 102  --  107 107  CO2 27  --  27 28  BUN 19  --  20 16  CREATININE 1.33* 1.36* 1.42* 1.21  CALCIUM 9.7  --  8.7* 8.8*  PROT 7.2  --   --   --   BILITOT 1.5*  --   --   --   ALKPHOS 68  --   --   --   ALT 30  --   --   --   AST 48*  --   --   --   GLUCOSE 88  --  101* 114*     11/23 - UDS: positive for amphetamines 11/23 - Acetaminophen: <10 11/23 - Ethanol <10 11/23 - TSH: 1.1214 11/23 - Ammonia: 42 11/23 - Vit B12: 306 11/23 - Folate: normal  Imaging/Diagnostic Tests:  11/23 - CT Head: IMPRESSION: No acute intracranial abnormality.  Arlyce HarmanLockamy, Daisy Mcneel, DO 12/27/2016, 7:16 AM PGY-1, [facility] FPTS Intern pager: [phone], text pages welcome

## 2016-12-28 ENCOUNTER — Other Ambulatory Visit: Payer: Self-pay

## 2016-12-28 ENCOUNTER — Inpatient Hospital Stay
Admission: AD | Admit: 2016-12-28 | Discharge: 2017-01-02 | DRG: 885 | Disposition: A | Discharge status: Home / Self Care | Payer: No Typology Code available for payment source | Source: Intra-hospital | Attending: Psychiatry | Admitting: Psychiatry

## 2016-12-28 DIAGNOSIS — Z91018 Allergy to other foods: Secondary | ICD-10-CM

## 2016-12-28 DIAGNOSIS — F3160 Bipolar disorder, current episode mixed, unspecified: Secondary | ICD-10-CM | POA: Diagnosis present

## 2016-12-28 DIAGNOSIS — B351 Tinea unguium: Secondary | ICD-10-CM

## 2016-12-28 DIAGNOSIS — I252 Old myocardial infarction: Secondary | ICD-10-CM

## 2016-12-28 DIAGNOSIS — E7800 Pure hypercholesterolemia, unspecified: Secondary | ICD-10-CM | POA: Diagnosis present

## 2016-12-28 DIAGNOSIS — Z590 Homelessness: Secondary | ICD-10-CM

## 2016-12-28 DIAGNOSIS — M79604 Pain in right leg: Secondary | ICD-10-CM | POA: Diagnosis present

## 2016-12-28 DIAGNOSIS — B353 Tinea pedis: Secondary | ICD-10-CM | POA: Diagnosis present

## 2016-12-28 DIAGNOSIS — G629 Polyneuropathy, unspecified: Secondary | ICD-10-CM | POA: Diagnosis present

## 2016-12-28 DIAGNOSIS — J449 Chronic obstructive pulmonary disease, unspecified: Secondary | ICD-10-CM | POA: Diagnosis present

## 2016-12-28 DIAGNOSIS — Z915 Personal history of self-harm: Secondary | ICD-10-CM | POA: Diagnosis not present

## 2016-12-28 DIAGNOSIS — E785 Hyperlipidemia, unspecified: Secondary | ICD-10-CM | POA: Diagnosis present

## 2016-12-28 DIAGNOSIS — F17200 Nicotine dependence, unspecified, uncomplicated: Secondary | ICD-10-CM | POA: Diagnosis present

## 2016-12-28 DIAGNOSIS — F1520 Other stimulant dependence, uncomplicated: Secondary | ICD-10-CM | POA: Diagnosis present

## 2016-12-28 DIAGNOSIS — F1910 Other psychoactive substance abuse, uncomplicated: Secondary | ICD-10-CM | POA: Diagnosis present

## 2016-12-28 DIAGNOSIS — Z955 Presence of coronary angioplasty implant and graft: Secondary | ICD-10-CM

## 2016-12-28 DIAGNOSIS — Z86718 Personal history of other venous thrombosis and embolism: Secondary | ICD-10-CM

## 2016-12-28 DIAGNOSIS — F250 Schizoaffective disorder, bipolar type: Secondary | ICD-10-CM | POA: Diagnosis present

## 2016-12-28 DIAGNOSIS — I2510 Atherosclerotic heart disease of native coronary artery without angina pectoris: Secondary | ICD-10-CM | POA: Diagnosis present

## 2016-12-28 DIAGNOSIS — G4700 Insomnia, unspecified: Secondary | ICD-10-CM | POA: Diagnosis present

## 2016-12-28 DIAGNOSIS — M549 Dorsalgia, unspecified: Secondary | ICD-10-CM | POA: Diagnosis present

## 2016-12-28 DIAGNOSIS — I110 Hypertensive heart disease with heart failure: Secondary | ICD-10-CM | POA: Diagnosis present

## 2016-12-28 DIAGNOSIS — I509 Heart failure, unspecified: Secondary | ICD-10-CM | POA: Diagnosis present

## 2016-12-28 DIAGNOSIS — J029 Acute pharyngitis, unspecified: Secondary | ICD-10-CM | POA: Diagnosis not present

## 2016-12-28 DIAGNOSIS — Z888 Allergy status to other drugs, medicaments and biological substances status: Secondary | ICD-10-CM | POA: Diagnosis not present

## 2016-12-28 DIAGNOSIS — Z9861 Coronary angioplasty status: Secondary | ICD-10-CM

## 2016-12-28 DIAGNOSIS — R011 Cardiac murmur, unspecified: Secondary | ICD-10-CM | POA: Diagnosis present

## 2016-12-28 DIAGNOSIS — Z9114 Patient's other noncompliance with medication regimen: Secondary | ICD-10-CM | POA: Diagnosis not present

## 2016-12-28 DIAGNOSIS — F209 Schizophrenia, unspecified: Secondary | ICD-10-CM | POA: Diagnosis present

## 2016-12-28 LAB — LIPID PANEL
CHOL/HDL RATIO: 2.9 ratio
CHOLESTEROL: 165 mg/dL (ref 0–200)
HDL: 56 mg/dL (ref >40)
LDL CALC: 97 mg/dL (ref 0–99)
Triglycerides: 58 mg/dL (ref <150)
VLDL: 12 mg/dL (ref 0–40)

## 2016-12-28 MED ORDER — CARBAMAZEPINE 200 MG PO TABS
200.0000 mg | ORAL_TABLET | Freq: Two times a day (BID) | ORAL | Status: DC
  Administered 2016-12-28 – 2016-12-29 (×2): 200 mg via ORAL
  Filled 2016-12-28 (×4): qty 1

## 2016-12-28 MED ORDER — TRAZODONE HCL 100 MG PO TABS
100.0000 mg | ORAL_TABLET | Freq: Every day | ORAL | Status: DC
  Administered 2016-12-28: 100 mg via ORAL
  Filled 2016-12-28 (×3): qty 1

## 2016-12-28 MED ORDER — MAGNESIUM HYDROXIDE 400 MG/5ML PO SUSP: 30.0000 mL | Freq: Every day | ORAL | Status: DC | PRN

## 2016-12-28 MED ORDER — ACETAMINOPHEN 325 MG PO TABS
650.0000 mg | ORAL_TABLET | Freq: Four times a day (QID) | ORAL | Status: DC | PRN
  Administered 2017-01-01: 650 mg via ORAL
  Filled 2016-12-28: qty 2

## 2016-12-28 MED ORDER — TERBINAFINE HCL 1 % EX CREA: TOPICAL_CREAM | Freq: Every day | CUTANEOUS | 0 refills | Status: DC

## 2016-12-28 MED ORDER — TERBINAFINE HCL 1 % EX CREA
TOPICAL_CREAM | Freq: Two times a day (BID) | CUTANEOUS | Status: DC
  Administered 2016-12-30 – 2017-01-01 (×4): via TOPICAL
  Filled 2016-12-28 (×2): qty 12

## 2016-12-28 MED ORDER — OLANZAPINE 5 MG PO TABS
15.0000 mg | ORAL_TABLET | Freq: Every day | ORAL | Status: DC
  Administered 2016-12-28: 22:00:00 15 mg via ORAL
  Filled 2016-12-28: qty 1

## 2016-12-28 MED ORDER — CHLORPROMAZINE HCL 100 MG PO TABS: 50.0000 mg | ORAL_TABLET | Freq: Four times a day (QID) | ORAL | Status: DC | PRN

## 2016-12-28 MED ORDER — GABAPENTIN 300 MG PO CAPS
600.0000 mg | ORAL_CAPSULE | Freq: Three times a day (TID) | ORAL | Status: DC
  Administered 2016-12-28 – 2016-12-31 (×4): 600 mg via ORAL
  Filled 2016-12-28 (×7): qty 2

## 2016-12-28 MED ORDER — ATORVASTATIN CALCIUM 20 MG PO TABS
20.0000 mg | ORAL_TABLET | Freq: Every day | ORAL | Status: DC
  Administered 2016-12-29: 20 mg via ORAL
  Filled 2016-12-28 (×3): qty 1

## 2016-12-28 MED ORDER — NITROGLYCERIN 0.4 MG SL SUBL: 0.4000 mg | SUBLINGUAL_TABLET | SUBLINGUAL | Status: DC | PRN

## 2016-12-28 MED ORDER — ADULT MULTIVITAMIN W/MINERALS CH: 1.0000 | ORAL_TABLET | Freq: Every day | ORAL | 0 refills | Status: DC

## 2016-12-28 MED ORDER — ALBUTEROL SULFATE (2.5 MG/3ML) 0.083% IN NEBU: 2.5000 mg | INHALATION_SOLUTION | Freq: Four times a day (QID) | RESPIRATORY_TRACT | Status: DC | PRN

## 2016-12-28 MED ORDER — NICOTINE 21 MG/24HR TD PT24
21.0000 mg | MEDICATED_PATCH | Freq: Every day | TRANSDERMAL | Status: DC
  Filled 2016-12-28 (×4): qty 1

## 2016-12-28 MED ORDER — ASPIRIN 81 MG PO CHEW
81.0000 mg | CHEWABLE_TABLET | Freq: Every day | ORAL | Status: DC
  Administered 2016-12-28 – 2016-12-31 (×4): 81 mg via ORAL
  Filled 2016-12-28 (×5): qty 1

## 2016-12-28 MED ORDER — ALUM & MAG HYDROXIDE-SIMETH 200-200-20 MG/5ML PO SUSP: 30.0000 mL | ORAL | Status: DC | PRN

## 2016-12-28 NOTE — Discharge Summary (Signed)
Family Medicine Teaching Brett Salinas Discharge Summary  Patient name: Brett Salinas Medical record number: [id number] Date of birth: 03/14/1970 Age: 46 y.o. Gender: male Date of Admission: 12/23/2016  Date of Discharge: 12/28/2016 Admitting Physician: Leighton Roachodd D McDiarmid, MD  Primary Care Provider: Jaclyn ShaggyAmao, Enobong, MD Consultants: Psychiatry  Indication for Hospitalization:   AMS Schizophrenia  Discharge Diagnoses/Problem List:   Schizophrenia Onchomycosis  [facility]  Discharge Condition: medically stable for transfer  Discharge Exam:   Gen: Alert and Oriented x 3, NAD HEENT: Normocephalic, atraumatic, PERRLA, EOMI CV: RRR, no murmurs, normal S1, S2 splitno wheezing, rales, or rhonchi, comfortable work of breathing Abd: non-distended, non-tender, soft, +bs in all four quadrants MSK: FROM in all four extremities Ext: no clubbing, cyanosis, or edema Neuro: CN II-XII intact, no focal or gross deficits Skin: warm, dry, intact, no rashes Psych: appropriate behavior, mood, denies any suicidal ideation or harmful intention  Brief Salinas Course:  [name] is a 46y/o male with PMH of Schizophrenia, HLD, HTN, CAD s/p stent placement of 10/2016 with history of cocaine abuse who presented to the ED on 11/23 due to AMS. Per ED report he was found incoherent wandering around a craft store. On admission to the ED he was unable to stay awake or answer questions appropriately. He was in and out of consciousness and intermittent full body movement spastic movements. Over the next 24 hours he was able to maintain awareness and began to answer questions appropriately. He was not able to recall what happened before he got admitted. His UDS was positive for amphetamines. Patient Head CT was negative for any acute abnormality, hemorrhage, or ischemic change. His vitals signs have remained stable, folate and B12 levels were normal and his CIWA scores gradually reduced. He still endorsing  signs and symptoms of active paranoia stating "government has implants tracking him" and he has had auditory hallucinations at night. He is medically stable otherwise and has been on Olanzapine 10mg  since admission.  Issues for Follow Up:  1. Schizophrenia management per inpatient Psych  Significant Procedures:   11/23 - CT Head  Significant Labs and Imaging:  Recent Labs  Lab 12/23/16 1908 12/23/16 2339 12/25/16 0342  WBC 7.0  --  4.1  HGB 14.3  --  13.4  HCT 42.0 42.8 40.3  PLT 229  --  183   Recent Labs  Lab 12/23/16 1908 12/23/16 2339 12/25/16 0342 12/26/16 0343  NA 138  --  139 139  K 4.4  --  3.7 3.6  CL 102  --  107 107  CO2 27  --  27 28  GLUCOSE 88  --  101* 114*  BUN 19  --  20 16  CREATININE 1.33* 1.36* 1.42* 1.21  CALCIUM 9.7  --  8.7* 8.8*  ALKPHOS 68  --   --   --   AST 48*  --   --   --   ALT 30  --   --   --   ALBUMIN 4.1  --   --   --      Results/Tests Pending at Time of Discharge: None  Discharge Medications:  Allergies as of 12/28/2016      Reactions   Catfish [fish Allergy] Anaphylaxis   Heparin Itching   Full-body itching inc throat   Pork-derived Products Itching   Burning/itching      Medication List    STOP taking these medications   carbamide peroxide 6.5 % OTIC solution Commonly known as:  DEBROX   clopidogrel 75 MG tablet Commonly known as:  PLAVIX   erythromycin ophthalmic ointment   metoprolol succinate 25 MG 24 hr tablet Commonly known as:  TOPROL XL   terbinafine 250 MG tablet Commonly known as:  LAMISIL   VOLTAREN 1 % Gel Generic drug:  diclofenac sodium     TAKE these medications   albuterol 108 (90 Base) MCG/ACT inhaler Commonly known as:  VENTOLIN HFA Inhale 2 puffs into the lungs every 6 (six) hours as needed for wheezing or shortness of breath.   aspirin EC 81 MG tablet Take 1 tablet (81 mg total) by mouth daily.   atorvastatin 20 MG tablet Commonly known as:  LIPITOR Take 1 tablet (20 mg total)  by mouth daily.   carbamazepine 200 MG tablet Commonly known as:  TEGRETOL Take 200 mg by mouth at bedtime.   fluticasone 50 MCG/ACT nasal spray Commonly known as:  FLONASE Place 2 sprays into both nostrils daily.   gabapentin 600 MG tablet Commonly known as:  NEURONTIN Take 1 tablet (600 mg total) by mouth 3 (three) times daily.   loratadine 10 MG tablet Commonly known as:  CLARITIN Take 1 tablet (10 mg total) by mouth daily.   multivitamin with minerals Tabs tablet Take 1 tablet by mouth daily. Start taking on:  12/29/2016   nitroGLYCERIN 0.4 MG SL tablet Commonly known as:  NITROSTAT Place 1 tablet (0.4 mg total) under the tongue every 5 (five) minutes x 3 doses as needed for chest pain.   terbinafine 1 % cream Commonly known as:  LAMISIL Apply topically daily.   ZYPREXA 10 MG tablet Generic drug:  OLANZapine Take 10 mg by mouth at bedtime.       Discharge Instructions: Please refer to Patient Instructions section of EMR for full details.  Patient was counseled important signs and symptoms that should prompt return to medical care, changes in medications, dietary instructions, activity restrictions, and follow up appointments.   Follow-Up Appointments:   Arlyce HarmanLockamy, Beulah Matusek, DO 12/28/2016, 12:15 PM PGY-1, [facility]

## 2016-12-28 NOTE — Plan of Care (Signed)
New admission. Patient understood information given to him about the unit.

## 2016-12-28 NOTE — Progress Notes (Signed)
Brett Salinas to be D/C'd Brett Salinas  per MD order.  Discussed with the patient and all questions fully answered.  An After Visit Summary was printed and given to the patient. Patient received prescription.  D/c education completed with patient including follow up instructions, medication list, d/c activities limitations if indicated, with other d/c instructions as indicated by MD - patient able to verbalize understanding, all questions fully answered.   Patient instructed to return to ED, call 911, or call MD for any changes in condition.   Patient ambulatory, and [facility] with [facility] transport services.  Susa RaringSheila  Natayah Warmack,RN 12/28/2016 4:16 PM

## 2016-12-28 NOTE — [facility] Group Notes (Signed)
[facility] Group Notes:  (Nursing/MHT/Case Management/Adjunct)  Date:  12/28/2016  Time:  9:34 PM  Type of Therapy:  Group Therapy  Participation Level:  Active  Participation Quality:  Appropriate  Affect:  Appropriate  Cognitive:  Appropriate  Insight:  Appropriate  Engagement in Group:  Engaged  Modes of Intervention:  Activity  Summary of Progress/Problems:  Mayra NeerJackie L Yomar Mejorado 12/28/2016, 9:34 PM

## 2016-12-28 NOTE — Tx Team (Signed)
Initial Treatment Plan 12/28/2016 6:04 PM Oren Bineterrick Delfino ZOX:[id number]    PATIENT STRESSORS: Other: Psychosis and homeless   PATIENT STRENGTHS: Ability for insight Average or above average intelligence Capable of independent living Communication skills   PATIENT IDENTIFIED PROBLEMS: Patient doesn't understand why he's here                     DISCHARGE CRITERIA:  Ability to meet basic life and health needs Improved stabilization in mood, thinking, and/or behavior Reduction of life-threatening or endangering symptoms to within safe limits  PRELIMINARY DISCHARGE PLAN: Outpatient therapy Return to previous living arrangement  PATIENT/FAMILY INVOLVEMENT: This treatment plan has been presented to and reviewed with the patient, Oren BinetDerrick Delpriore.  The patient has been given the opportunity to ask questions and make suggestions.  Imraan Wendell, RN 12/28/2016, 6:04 PM

## 2016-12-28 NOTE — Progress Notes (Signed)
Patient will DC to: [facility] [address] Anticipated DC date: 12/28/16 Family notified: Patient notifying family Transport by: [facility] 3:30[facility]   Per MD patient [facility]. RN, patient, patient's family, and facility notified of DC. Discharge Summary sent to facility. RN given number for report [phone]). Voluntary consent [facility] signed and [facility].   CSW signing off.  Cristobal GoldmannNadia Gunter Conde, [place] Clinical Social Worker [phone]

## 2016-12-28 NOTE — Progress Notes (Signed)
[facility] [phone], pt going to bed 312.

## 2016-12-29 DIAGNOSIS — F250 Schizoaffective disorder, bipolar type: Principal | ICD-10-CM

## 2016-12-29 DIAGNOSIS — F17200 Nicotine dependence, unspecified, uncomplicated: Secondary | ICD-10-CM | POA: Diagnosis present

## 2016-12-29 MED ORDER — ARIPIPRAZOLE 10 MG PO TABS
10.0000 mg | ORAL_TABLET | Freq: Every day | ORAL | Status: DC
  Administered 2016-12-29: 10 mg via ORAL
  Filled 2016-12-29 (×5): qty 1

## 2016-12-29 MED ORDER — ARIPIPRAZOLE ER 400 MG IM SRER
400.0000 mg | INTRAMUSCULAR | Status: DC
  Administered 2016-12-29: 400 mg via INTRAMUSCULAR
  Filled 2016-12-29: qty 400

## 2016-12-29 NOTE — Progress Notes (Signed)
Patient ID: Brett Salinas, male   DOB: 12/15/1970, 46 y.o.   MRN: [id number] Appeared to be interacting well with peers, attended the wrap up group, pleasant on approach but has an intimidating edge in behavior, heard speaking fluent spanish and declared that he was raised in [place]; questioned his medications, checked the names as we reconciled, reviewed them and the physician that ordered them. Denied pain, denied SI/HI/AVH.

## 2016-12-29 NOTE — Progress Notes (Signed)
Recreation Therapy Notes  Date: 11.29.18  Time: 9:30 am  Location: Craft Room  Behavioral response: Appropriate  Intervention Topic: Anger  Discussion/Intervention: Group content on today was focused on anger management. The group defined anger and reasons they become angry. Individuals expressed negative way they have dealt with anger in the past. Patients stated some positive ways they could deal with anger in the future. The group described how anger can affect your health and daily plans. Individuals participated in the intervention "Filling the cup" where they had a chance to see a visual of how angry they normally get.   Clinical Observations/Feedback:  Patient came to group late due to unknown reasons. While in group he stated that when someone is angry it affects that person. He participated in the intervention during group.  Zannah Melucci LRT/CTRS         Bricia Taher 12/29/2016 11:09 AM

## 2016-12-29 NOTE — Progress Notes (Addendum)
Recreation Therapy Notes          Brett Salinas 12/29/2016 3:59 PM

## 2016-12-29 NOTE — Progress Notes (Signed)
Recreation Therapy Notes  INPATIENT RECREATION THERAPY ASSESSMENT  Patient Details Name: Brett Salinas MRN: [id number] DOB: 01/26/1971 Today's Date: 12/29/2016 Patient stated during assessment he feels as if he is being overmedicated. He stated that he told his nurse.  Patient Stressors: Other (Comment)(Homeless, start seeing and hearing things)  Coping Skills:   Isolate, Other (Comment)(Pray, Meditate,Rest)  Personal Challenges: Concentration, Expressing Yourself, Relationships, Social Interaction, Time Management, Trusting Others  Leisure Interests (2+):  (None)  Awareness of Community Resources:  Yes  Community Resources:  [place], [place]  Current Use: Yes  If no, Barriers?:    Patient Strengths:  Learn new things quickly, Following directions  Patient Identified Areas of Improvement:  Get medication regulated, be more determined  Current Recreation Participation:  None  Patient Goal for Hospitalization:  Focus on housing and not partying  [place]   Current SI (including self-harm):  No  Current HI:  No  Consent to Intern Participation: N/A   Gregor Dershem 12/29/2016, 10:41 AM

## 2016-12-29 NOTE — Progress Notes (Signed)
Patient ID: Oren BinetDerrick Stowell, male   DOB: 01/31/1970, 46 y.o.   MRN: [id number] Observed resting in bed with spontaneous response at prompt, when asked about his day, patient said, "not well, not too good, no energy, no strength, unable to even take a shower.." lethargic due to medications, bedtime medications held per patient's request. He did not come out for snacks.

## 2016-12-29 NOTE — H&P (Addendum)
Psychiatric Admission Assessment Adult  Patient Identification: Brett Salinas MRN:  [id number] Date of Evaluation:  12/29/2016 Chief Complaint:  Depression Principal Diagnosis: Schizoaffective disorder, bipolar type (HCC) Diagnosis:   Patient Active Problem List   Diagnosis Date Noted  . Tobacco use disorder [F17.200] 12/29/2016  . COPD (chronic obstructive pulmonary disease) (HCC) [J44.9] 12/28/2016  . Dyslipidemia [E78.5] 12/28/2016  . 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 Drug abuse (HCC) [F19.10]   . Agitation [R45.1] 12/24/2016  . Aggression [R46.89] 12/24/2016  . Altered mental status [R41.82] 12/23/2016  . History of ED visit for Medical clearance for incarceration for alleged aggressive behavior and crack cocaine use [Z00.8] 09/05/2016  . Right knee pain [M25.561] 07/20/2016  . Essential hypertension [I10] 11/30/2015  . CAD -S/P PCI LAD/DES 10/13/15 [I25.10, Z98.61] 10/14/2015  . Wound infection- for I&D achilles wound 10/14/15 [T14.8XXA, L08.9] 10/14/2015  . Cocaine abuse-drug sceen positive, History of [F14.10] 10/14/2015  . Schizoaffective disorder, bipolar type (HCC) [F25.0] 10/14/2015  . Infection [B99.9] 10/14/2015  . NSTEMI (non-ST elevated myocardial infarction) (HCC) [I21.4]   . Chest pain [R07.9] 10/12/2015  . Achilles tendon rupture-surgery 09/19/15 [S86.019A] 09/19/2015   History of Present Illness:   Identifying data. [name] is a 46 year old male with a history of schizoaffective disorder.  Chief complaint. "I lost my mind."  History of present illness. Information was obtained  From the patient and the chart. The patient was brought to [facility] ER utterly confused, apparently walking around for two days. He was positive for amphetamines and initially was thought to be in withdrawals. He was briefly admitted to medicine for altered mental status. He does have a history of schizophrenia and has not been compliant with medications of  Zyprexa and Tegretol as they make his sleepy. In the ER, he was clearly psychotic, paranoid, delusional, hallucinating and agitated. He felt the government is after him to eliminate people with mental illness. He was unable to answer many questions. He has a history of substance use including cocaine.  Today, he is rather frustrated with me for asking the same questions. He does not remember circumstances of his admission but admits that he lost his mind, Liley in a manic episode. Not the first one. He denies suicidal or homicidal ideation but still paranoid and hallucinating.   Past psychiatric history. Long history of mental illness and substance abuse with multiple hospitalizations and medication trials. He could mane all antipsychotics and mood stabilizers. Does not like medicines that make him sleepy, especially Zyprexa. He is good to take Abilify including Maintena. He has never been on injectable. He attempted suicide multiple times, last time over 10 year ago by overdose.  Family psychiatric history. Unknown.  Social history. He is an Investment banker, operational. I don't know if he is service connected. He has been homeless since July.  Total Time spent with patient: 1 hour  Is the patient at risk to self? No.  Has the patient been a risk to self in the past 6 months? No.  Has the patient been a risk to self within the distant past? Yes.    Is the patient a risk to others? No.  Has the patient been a risk to others in the past 6 months? No.  Has the patient been a risk to others within the distant past? No.   Prior Inpatient Therapy:   Prior Outpatient Therapy:    Alcohol Screening: 1. How often do you have a drink containing alcohol?: Monthly or less  2. How many drinks containing alcohol do you have on a typical day when you are drinking?: 1 or 2 3. How often do you have six or more drinks on one occasion?: Less than monthly AUDIT-C Score: 2 4. How often during the last year have you found that  you were not able to stop drinking once you had started?: Never 5. How often during the last year have you failed to do what was normally expected from you becasue of drinking?: Never 6. How often during the last year have you needed a first drink in the morning to get yourself going after a heavy drinking session?: Never 7. How often during the last year have you had a feeling of guilt of remorse after drinking?: Never 8. How often during the last year have you been unable to remember what happened the night before because you had been drinking?: Never 9. Have you or someone else been injured as a result of your drinking?: No 10. Has a relative or friend or a doctor or another health worker been concerned about your drinking or suggested you cut down?: No Alcohol Use Disorder Identification Test Final Score (AUDIT): 2 Intervention/Follow-up: AUDIT Score <7 follow-up not indicated Substance Abuse History in the last 12 months:  Yes.   Consequences of Substance Abuse: Negative Previous Psychotropic Medications: Yes  Psychological Evaluations: No  Past Medical History:  Past Medical History:  Diagnosis Date  . Achilles tendon rupture-surgery 09/19/15 09/19/2015  . Altered mental status 12/23/2016  . Arthritis    "hips, knees, back" (10/13/2015)  . Bipolar disorder (HCC)   . CAD -S/P PCI LAD/DES 10/13/15 10/14/2015  . CHF (congestive heart failure) (HCC)   . Cocaine abuse-drug sceen positive, History of 10/14/2015  . Coronary artery disease   . Depression   . DVT (deep venous thrombosis) (HCC) 09/2015   right  . Essential hypertension 11/30/2015  . Heart murmur   . High cholesterol   . Hypertension   . Myocardial infarction (HCC) 10/12/2015 X 2-3  . NSTEMI (non-ST elevated myocardial infarction) (HCC)   . Rectal myiasis   . Schizoaffective disorder, bipolar type (HCC) 10/14/2015  . Schizoaffective disorder, bipolar type (HCC)   . Sciatica     Past Surgical History:  Procedure Laterality  Date  . CARDIAC CATHETERIZATION N/A 10/13/2015   Procedure: Left Heart Cath and Coronary Angiography;  Surgeon: Corky CraftsJayadeep S Varanasi, MD;  [facility];  Service: Cardiovascular;  Laterality: N/A;  . CARDIAC CATHETERIZATION N/A 10/13/2015   Procedure: Coronary Stent Intervention;  Surgeon: Corky CraftsJayadeep S Varanasi, MD;  [facility];  Service: Cardiovascular;  Laterality: N/A;  . CARDIAC CATHETERIZATION N/A 10/13/2015   Procedure: Intravascular Ultrasound/IVUS;  Surgeon: Corky CraftsJayadeep S Varanasi, MD;  [facility];  Service: Cardiovascular;  Laterality: N/A;  . CORONARY ANGIOPLASTY WITH STENT PLACEMENT  10/13/2015  . I&D EXTREMITY Right 09/19/2015   Procedure: IRRIGATION AND DEBRIDEMENT ANKLE LACERATIONS POSSIBLE TENDON REPAIR;  Surgeon: Cammy CopaScott Gregory Dean, MD;  Location: [facility] OR;  Service: Orthopedics;  Laterality: Right;  . I&D EXTREMITY Right 10/14/2015   Procedure: IRRIGATION AND DEBRIDEMENT EXTREMITY/Right Ankle;  Surgeon: Cammy CopaScott Gregory Dean, MD;  Location: [facility] OR;  Service: Orthopedics;  Laterality: Right;   Family History: History reviewed. No pertinent family history.  Tobacco Screening:   Social History:  Social History   Substance and Sexual Activity  Alcohol Use Yes     Social History   Substance and Sexual Activity  Drug  Use Yes  . Types: Cocaine, Marijuana   Comment: 10/13/2015 "I've tried alot; I don't have any habits"    Additional Social History: Marital status: Single Are you sexually active?: No What is your sexual orientation?: Heterosexual Has your sexual activity been affected by drugs, alcohol, medication, or emotional stress?: Its a possibility, its not a priority in his life right now. Does patient have children?: Yes How many children?: (7-11 children, unsure how many) How is patient's relationship with their children?: Not a good relationship, doesnt commuinicate with them much.                         Allergies:    Allergies  Allergen Reactions  . 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 Heparin Itching    Full-body itching inc throat  . Pork-Derived Products Itching    Burning/itching   Lab Results:  Results for orders placed or performed during the hospital encounter of 12/23/16 (from the past 48 hour(s))  Lipid panel     Status: None   Collection Time: 12/28/16  5:17 AM  Result Value Ref Range   Cholesterol 165 0 - 200 mg/dL   Triglycerides 58 <161<150 mg/dL   HDL 56 >09>40 mg/dL   Total CHOL/HDL Ratio 2.9 RATIO   VLDL 12 0 - 40 mg/dL   LDL Cholesterol 97 0 - 99 mg/dL    Comment:        Total Cholesterol/HDL:CHD Risk Coronary Heart Disease Risk Table                     Men   Women  1/2 Average Risk   3.4   3.3  Average Risk       5.0   4.4  2 X Average Risk   9.6   7.1  3 X Average Risk  23.4   11.0        Use the calculated Patient Ratio above and the CHD Risk Table to determine the patient's CHD Risk.        ATP III CLASSIFICATION (LDL):  <100     mg/dL   Optimal  [id number]  mg/dL   Near or Above                    Optimal  130-159  mg/dL   Borderline  [id number]  mg/dL   High  >478>190     mg/dL   Very High     Blood Alcohol level:  Lab Results  Component Value Date   ETH <10 12/23/2016   ETH <5 09/19/2015    Metabolic Disorder Labs:  Lab Results  Component Value Date   HGBA1C 5.6 10/12/2015   MPG 114 10/12/2015   No results found for: PROLACTIN Lab Results  Component Value Date   CHOL 165 12/28/2016   TRIG 58 12/28/2016   HDL 56 12/28/2016   CHOLHDL 2.9 12/28/2016   VLDL 12 12/28/2016   LDLCALC 97 12/28/2016   LDLCALC 96 10/13/2015    Current Medications: Current Facility-Administered Medications  Medication Dose Route Frequency Provider Last Rate Last Dose  . acetaminophen (TYLENOL) tablet 650 mg  650 mg Oral Q6H PRN Darshana Curnutt B, MD      . albuterol (PROVENTIL) (2.5 MG/3ML) 0.083% nebulizer solution 2.5 mg  2.5 mg Inhalation Q6H PRN Bemnet Trovato B,  MD      . alum & mag hydroxide-simeth (MAALOX/MYLANTA) 200-200-20 MG/5ML suspension 30 mL  30 mL  Oral Q4H PRN Jaisha Villacres B, MD      . ARIPiprazole (ABILIFY) tablet 10 mg  10 mg Oral Daily Twylah Bennetts B, MD      . ARIPiprazole ER SRER 400 mg  400 mg Intramuscular Q28 days Jamauri Kruzel B, MD      . aspirin chewable tablet 81 mg  81 mg Oral Daily Madellyn Denio B, MD   81 mg at 12/29/16 0848  . atorvastatin (LIPITOR) tablet 20 mg  20 mg Oral q1800 Ebb Carelock B, MD      . carbamazepine (TEGRETOL) tablet 200 mg  200 mg Oral BID AC & HS Jomarion Mish B, MD   200 mg at 12/29/16 0848  . chlorproMAZINE (THORAZINE) tablet 50 mg  50 mg Oral QID PRN Iden Stripling B, MD      . gabapentin (NEURONTIN) capsule 600 mg  600 mg Oral TID Bellanie Matthew B, MD   600 mg at 12/29/16 1153  . magnesium hydroxide (MILK OF MAGNESIA) suspension 30 mL  30 mL Oral Daily PRN Zyshawn Bohnenkamp B, MD      . nicotine (NICODERM CQ - dosed in mg/24 hours) patch 21 mg  21 mg Transdermal Q0600 Valla Pacey B, MD      . nitroGLYCERIN (NITROSTAT) SL tablet 0.4 mg  0.4 mg Sublingual Q5 min PRN Labresha Mellor B, MD      . terbinafine (LAMISIL) 1 % cream   Topical BID Clapacs, John T, MD      . traZODone (DESYREL) tablet 100 mg  100 mg Oral QHS Loudon Krakow B, MD   100 mg at 12/28/16 2138   PTA Medications: Medications Prior to Admission  Medication Sig Dispense Refill Last Dose  . albuterol (VENTOLIN HFA) 108 (90 Base) MCG/ACT inhaler Inhale 2 puffs into the lungs every 6 (six) hours as needed for wheezing or shortness of breath. 54 g 3 unknown at prn  . aspirin EC 81 MG tablet Take 1 tablet (81 mg total) by mouth daily. (Patient not taking: Reported on 12/25/2016) 90 tablet 3 Not Taking at Unknown time  . atorvastatin (LIPITOR) 20 MG tablet Take 1 tablet (20 mg total) by mouth daily. 90 tablet 2 Taking  . carbamazepine (TEGRETOL) 200 MG tablet Take 200 mg by  mouth at bedtime.   unknown  . fluticasone (FLONASE) 50 MCG/ACT nasal spray Place 2 sprays into both nostrils daily. 16 g 6 unknown at prn  . gabapentin (NEURONTIN) 600 MG tablet Take 1 tablet (600 mg total) by mouth 3 (three) times daily. 90 tablet 1 unknown  . loratadine (CLARITIN) 10 MG tablet Take 1 tablet (10 mg total) by mouth daily. (Patient not taking: Reported on 12/25/2016) 30 tablet 11 Not Taking at Unknown time  . Multiple Vitamin (MULTIVITAMIN WITH MINERALS) TABS tablet Take 1 tablet by mouth daily. 30 tablet 0   . nitroGLYCERIN (NITROSTAT) 0.4 MG SL tablet Place 1 tablet (0.4 mg total) under the tongue every 5 (five) minutes x 3 doses as needed for chest pain. 30 tablet 12  at prn  . OLANZapine (ZYPREXA) 10 MG tablet Take 10 mg by mouth at bedtime.   Past Week at Unknown time  . terbinafine (LAMISIL) 1 % cream Apply topically daily. 30 g 0     Musculoskeletal: Strength & Muscle Tone: within normal limits Gait & Station: normal Patient leans: N/A  Psychiatric Specialty Exam: Physical Exam  Nursing note and vitals reviewed. Constitutional: He is oriented to person, place, and time. He  appears well-developed and well-nourished.  HENT:  Head: Normocephalic and atraumatic.  Eyes: Conjunctivae and EOM are normal. Pupils are equal, round, and reactive to light.  Neck: Normal range of motion. Neck supple.  Cardiovascular: Normal rate, regular rhythm and normal heart sounds.  Respiratory: Effort normal and breath sounds normal.  GI: Soft.  Musculoskeletal: Normal range of motion.  Neurological: He is alert and oriented to person, place, and time.  Skin: Skin is dry.  Psychiatric: His affect is blunt. His speech is delayed. He is withdrawn and actively hallucinating. Thought content is paranoid and delusional. Cognition and memory are impaired. He expresses impulsivity.    Review of Systems  Neurological: Negative.   Psychiatric/Behavioral: Positive for hallucinations and  substance abuse. The patient has insomnia.   All other systems reviewed and are negative.   Blood pressure (!) 139/94, pulse 78, temperature 98.8 F (37.1 C), resp. rate 18, height 6\' 7"  (2.007 m), weight 95.3 kg (210 lb), SpO2 98 %.Body mass index is 23.66 kg/m.  See SRA                                                  Sleep:       Treatment Plan Summary: Daily contact with patient to assess and evaluate symptoms and progress in treatment and Medication management   Mr. Dibiasio is a 46 year old male with a history of schizophrenia admitted for altered mental status in the context of medication noncompliance and substance abuse.  #Psychosis -continue Tegretol 200 mg BID -discontinue Zyprexa -start Abilify 10 mg daily -start Abilify maintena 400 mg every 28 days  #Insomnia -continue Trazodone  #Substance abuse -patient minimizes problems and declines treatment  #COPD -continue Albuterol  #CAD -continue ASA, Nitrostat, Lipitor  #Smoking -Nicotine patch is available  #Disposition -TBE  Observation Level/Precautions:  15 minute checks  Laboratory:  CBC Chemistry Profile UDS UA  Psychotherapy:    Medications:    Consultations:    Discharge Concerns:    Estimated LOS:  Other:     Physician Treatment Plan for Primary Diagnosis: Schizoaffective disorder, bipolar type (HCC) Long Term Goal(s): Improvement in symptoms so as ready for discharge  Short Term Goals: Ability to identify changes in lifestyle to reduce recurrence of condition will improve, Ability to verbalize feelings will improve, Ability to disclose and discuss suicidal ideas, Ability to demonstrate self-control will improve, Ability to identify and develop effective coping behaviors will improve, Compliance with prescribed medications will improve and Ability to identify triggers associated with substance abuse/mental health issues will improve  Physician Treatment Plan for Secondary  Diagnosis: Principal Problem:   Schizoaffective disorder, bipolar type (HCC) Active Problems:   CAD -S/P PCI LAD/DES 10/13/15   COPD (chronic obstructive pulmonary disease) (HCC)   Dyslipidemia   Amphetamine use disorder, severe (HCC)   Tobacco use disorder  Long Term Goal(s): Improvement in symptoms so as ready for discharge  Short Term Goals: Ability to identify changes in lifestyle to reduce recurrence of condition will improve, Ability to demonstrate self-control will improve and Ability to identify triggers associated with substance abuse/mental health issues will improve  I certify that inpatient services furnished can reasonably be expected to improve the patient's condition.    Kristine Linea, MD 11/29/20181:59 PM

## 2016-12-29 NOTE — [facility] Suicide Risk Assessment (Signed)
[facility]   Nursing information obtained from:  Patient Demographic factors:  Male Current Mental Status:  NA(altered mental status) Loss Factors:  NA Historical Factors:  NA Risk Reduction Factors:  NA  Total Time spent with patient: 1 hour Principal Problem: Schizoaffective disorder, bipolar type (HCC) Diagnosis:   Patient Active Problem List   Diagnosis Date Noted  . COPD (chronic obstructive pulmonary disease) (HCC) [J44.9] 12/28/2016  . Dyslipidemia [E78.5] 12/28/2016  . 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 Drug abuse (HCC) [F19.10]   . Agitation [R45.1] 12/24/2016  . Aggression [R46.89] 12/24/2016  . Altered mental status [R41.82] 12/23/2016  . History of ED visit for Medical clearance for incarceration for alleged aggressive behavior and crack cocaine use [Z00.8] 09/05/2016  . Right knee pain [M25.561] 07/20/2016  . Essential hypertension [I10] 11/30/2015  . CAD -S/P PCI LAD/DES 10/13/15 [I25.10, Z98.61] 10/14/2015  . Wound infection- for I&D achilles wound 10/14/15 [T14.8XXA, L08.9] 10/14/2015  . Cocaine abuse-drug sceen positive, History of [F14.10] 10/14/2015  . Schizoaffective disorder, bipolar type (HCC) [F25.0] 10/14/2015  . Infection [B99.9] 10/14/2015  . NSTEMI (non-ST elevated myocardial infarction) (HCC) [I21.4]   . Chest pain [R07.9] 10/12/2015  . Achilles tendon rupture-surgery 09/19/15 [S86.019A] 09/19/2015   Subjective Data: "lost my mind"  Continued Clinical Symptoms:  Alcohol Use Disorder Identification Test Final Score (AUDIT): 2 The "Alcohol Use Disorders Identification Test", Guidelines for Use in Primary Care, Second Edition.  World [employer] [facility](WHO). Score between 0-7:  no or low risk or alcohol related problems. Score between 8-15:  moderate risk of alcohol related problems. Score between 16-19:  high risk of alcohol related problems. Score 20 or above:  warrants further diagnostic  evaluation for alcohol dependence and treatment.   CLINICAL FACTORS:   Bipolar Disorder:   Mixed State Alcohol/Substance Abuse/Dependencies   Musculoskeletal: Strength & Muscle Tone: within normal limits Gait & Station: normal Patient leans: N/A  Psychiatric Specialty Exam: Physical Exam  Nursing note and vitals reviewed. Psychiatric: His affect is blunt. His speech is delayed. He is slowed and withdrawn. Thought content is paranoid. Cognition and memory are impaired. He expresses impulsivity.    Review of Systems  Neurological: Negative.   Psychiatric/Behavioral: Positive for hallucinations.  All other systems reviewed and are negative.   Blood pressure (!) 139/94, pulse 78, temperature 98.8 F (37.1 C), resp. rate 18, height 6\' 7"  (2.007 m), weight 95.3 kg (210 lb), SpO2 98 %.Body mass index is 23.66 kg/m.  General Appearance: Casual  Eye Contact:  Poor  Speech:  Slow  Volume:  Decreased  Mood:  Depressed  Affect:  Blunt  Thought Process:  Linear and Descriptions of Associations: Loose  Orientation:  Full (Time, Place, and Person)  Thought Content:  Delusions, Hallucinations: Auditory and Paranoid Ideation  Suicidal Thoughts:  No  Homicidal Thoughts:  No  Memory:  Immediate;   Fair Recent;   Fair Remote;   Fair  Judgement:  Poor  Insight:  Lacking  Psychomotor Activity:  Psychomotor Retardation  Concentration:  Concentration: Poor and Attention Span: Poor  Recall:  Poor  Fund of Knowledge:  Poor  Language:  Poor  Akathisia:  No  Handed:  Right  AIMS (if indicated):     Assets:  Communication Skills Desire for Improvement Physical Health Resilience  ADL's:  Intact  Cognition:  WNL  Sleep:         COGNITIVE FEATURES THAT CONTRIBUTE TO RISK:  None    SUICIDE  RISK:   Moderate:  Frequent suicidal ideation with limited intensity, and duration, some specificity in terms of plans, no associated intent, good self-control, limited dysphoria/symptomatology,  some risk factors present, and identifiable protective factors, including available and accessible social support.  PLAN OF CARE: hospital admission, medication management, substance abuse counseling, discharge planning.  Brett Salinas is a 46 year old male with a history of schizophrenia admitted for altered mental status in the context of medication noncompliance and substance abuse.  #Psychosis -continue Tegretol 200 mg BID -discontinue Zyprexa -start Abilify 10 mg daily -start Abilify maintena 400 mg every 28 days  #Insomnia -continue Trazodone  #Substance abuse -patient minimizes problems and declines treatment  #COPD -continue Albuterol  #CAD -continue ASA, Nitrostat, Lipitor  #Smoking -Nicotine patch is available  #Disposition -TBE   #   I certify that inpatient services furnished can reasonably be expected to improve the patient's condition.   Kristine LineaJolanta Jariyah Hackley, MD 12/29/2016, 1:38 PM

## 2016-12-29 NOTE — Plan of Care (Signed)
Patient slept for Estimated Hours of 7.15; Precautionary checks every 15 minutes for safety maintained, room free of safety hazards, patient sustains no injury or falls during this shift.  

## 2016-12-29 NOTE — Plan of Care (Signed)
Patient up on the unit. He is med compliant and pleasant. He states the medication from last night is too strong for him it has him groggy. He denies si, hi, avh. No behavior issues.

## 2016-12-29 NOTE — [facility] Group Notes (Signed)
12/29/2016 1PM Type of Therapy/Topic:  Group Therapy:  Balance in Life  Participation Level:  Did Not Attend  Description of Group:   This group will address the concept of balance and how it feels and looks when one is unbalanced. Patients will be encouraged to process areas in their lives that are out of balance and identify reasons for remaining unbalanced. Facilitators will guide patients in utilizing problem-solving interventions to address and correct the stressor making their life unbalanced. Understanding and applying boundaries will be explored and addressed for obtaining and maintaining a balanced life. Patients will be encouraged to explore ways to assertively make their unbalanced needs known to significant others in their lives, using other group members and facilitator for support and feedback.  Therapeutic Goals: 1. Patient will identify two or more emotions or situations they have that consume much of in their lives. 2. Patient will identify signs/triggers that life has become out of balance:  3. Patient will identify two ways to set boundaries in order to achieve balance in their lives:  4. Patient will demonstrate ability to communicate their needs through discussion and/or role plays  Summary of Patient Progress:  Did not attend      Therapeutic Modalities:   Cognitive Behavioral Therapy Solution-Focused Therapy Assertiveness Training  Johny Shearsassandra  Priest Lockridge, LCSW

## 2016-12-29 NOTE — [facility] Suicide Risk Assessment (Signed)
[facility] INPATIENT:  Family/Significant Other Suicide Prevention Education  Suicide Prevention Education:  Education Completed; [name], [place], [phone] has been identified by the patient as the family member/significant other with whom the patient will be residing, and identified as the person(s) who will aid the patient in the event of a mental health crisis (suicidal ideations/suicide attempt).  With written consent from the patient, the family member/significant other has been provided the following suicide prevention education, prior to the and/or following the discharge of the patient.  The suicide prevention education provided includes the following:  Suicide risk factors  Suicide prevention and interventions  National Suicide Hotline telephone number  [facility] assessment telephone number  [facility] Assistance 911  [facility] and/or [facility]idential [facility] Crisis [facility] telephone number  Request made of family/significant other to:  Remove weapons (e.g., guns, rifles, knives), all items previously/currently identified as safety concern.    Remove drugs/medications (over-the-counter, prescriptions, illicit drugs), all items previously/currently identified as a safety concern.  The family member/significant other verbalizes understanding of the suicide prevention education information provided.  The family member/significant other agrees to remove the items of safety concern listed above.  Brett ShearsCassandra  Brett Salinas 12/29/2016, 11:47 AM

## 2016-12-29 NOTE — [facility] Suicide Risk Assessment (Signed)
[facility] INPATIENT:  Family/Significant Other Suicide Prevention Education  Suicide Prevention Education:  Contact Attempts: Geanie BerlinMarcal Mcclarty, Uncle, [phone]  has been identified by the patient as the family member/significant other with whom the patient will be residing, and identified as the person(s) who will aid the patient in the event of a mental health crisis.  With written consent from the patient, two attempts were made to provide suicide prevention education, prior to and/or following the patient's discharge.  We were unsuccessful in providing suicide prevention education.  A suicide education pamphlet was given to the patient to share with family/significant other.  Date and time of first attempt:01/28/2017 at 11:30am   Johny ShearsCassandra  Quintarius Ferns 12/29/2016, 11:29 AM

## 2016-12-30 MED ORDER — ADULT MULTIVITAMIN W/MINERALS CH
1.0000 | ORAL_TABLET | Freq: Every day | ORAL | Status: DC
  Administered 2016-12-30 – 2017-01-02 (×4): 1 via ORAL
  Filled 2016-12-30 (×4): qty 1

## 2016-12-30 NOTE — Progress Notes (Signed)
[facility] MD Progress Note  12/30/2016 2:55 PM Brett Salinas  MRN:  [id number]  Subjective:   Brett Salinas "found his mind" psychotic disorganization have improved and he had good conversation with [place] in treatment team.  He has quite high expectation about disability and housing. There are no behavioral problems but he is quite suspisious about his medications and accused his nurse of giving him wrong MVI. Sleep and appetite are poor. Refused nighttime medication as they make him too sleepy but took Abilify maintena injection  Treatment plan. We will continue to offer Tegretol and oral Abilify.  Social/disposition. 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   Principal Problem: Schizoaffective disorder, bipolar type (HCC) Diagnosis:   Patient Active Problem List   Diagnosis Date Noted  . Tobacco use disorder [F17.200] 12/29/2016  . COPD (chronic obstructive pulmonary disease) (HCC) [J44.9] 12/28/2016  . Dyslipidemia [E78.5] 12/28/2016  . 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 Drug abuse (HCC) [F19.10]   . Agitation [R45.1] 12/24/2016  . Aggression [R46.89] 12/24/2016  . Altered mental status [R41.82] 12/23/2016  . History of ED visit for Medical clearance for incarceration for alleged aggressive behavior and crack cocaine use [Z00.8] 09/05/2016  . Right knee pain [M25.561] 07/20/2016  . Essential hypertension [I10] 11/30/2015  . CAD -S/P PCI LAD/DES 10/13/15 [I25.10, Z98.61] 10/14/2015  . Wound infection- for I&D achilles wound 10/14/15 [T14.8XXA, L08.9] 10/14/2015  . Cocaine abuse-drug sceen positive, History of [F14.10] 10/14/2015  . Schizoaffective disorder, bipolar type (HCC) [F25.0] 10/14/2015  . Infection [B99.9] 10/14/2015  . NSTEMI (non-ST elevated myocardial infarction) (HCC) [I21.4]   . Chest pain [R07.9] 10/12/2015  . Achilles tendon rupture-surgery 09/19/15 [S86.019A] 09/19/2015   Total Time spent with patient: 30  minutes  Past Psychiatric History: schizophrenia  Past Medical History:  Past Medical History:  Diagnosis Date  . Achilles tendon rupture-surgery 09/19/15 09/19/2015  . Altered mental status 12/23/2016  . Arthritis    "hips, knees, back" (10/13/2015)  . Bipolar disorder (HCC)   . CAD -S/P PCI LAD/DES 10/13/15 10/14/2015  . CHF (congestive heart failure) (HCC)   . Cocaine abuse-drug sceen positive, History of 10/14/2015  . Coronary artery disease   . Depression   . DVT (deep venous thrombosis) (HCC) 09/2015   right  . Essential hypertension 11/30/2015  . Heart murmur   . High cholesterol   . Hypertension   . Myocardial infarction (HCC) 10/12/2015 X 2-3  . NSTEMI (non-ST elevated myocardial infarction) (HCC)   . Rectal myiasis   . Schizoaffective disorder, bipolar type (HCC) 10/14/2015  . Schizoaffective disorder, bipolar type (HCC)   . Sciatica     Past Surgical History:  Procedure Laterality Date  . CARDIAC CATHETERIZATION N/A 10/13/2015   Procedure: Left Heart Cath and Coronary Angiography;  Surgeon: Corky Crafts, MD;  Location: [facility] INVASIVE CV LAB;  Service: Cardiovascular;  Laterality: N/A;  . CARDIAC CATHETERIZATION N/A 10/13/2015   Procedure: Coronary Stent Intervention;  Surgeon: Corky Crafts, MD;  Location: [facility] INVASIVE CV LAB;  Service: Cardiovascular;  Laterality: N/A;  . CARDIAC CATHETERIZATION N/A 10/13/2015   Procedure: Intravascular Ultrasound/IVUS;  Surgeon: Corky Crafts, MD;  Location: [facility] INVASIVE CV LAB;  Service: Cardiovascular;  Laterality: N/A;  . CORONARY ANGIOPLASTY WITH STENT PLACEMENT  10/13/2015  . I&D EXTREMITY Right 09/19/2015   Procedure: IRRIGATION AND DEBRIDEMENT ANKLE LACERATIONS POSSIBLE TENDON REPAIR;  Surgeon: Cammy Copa, MD;  Location: [facility] OR;  Service: Orthopedics;  Laterality: Right;  .  I&D EXTREMITY Right 10/14/2015   Procedure: IRRIGATION AND DEBRIDEMENT EXTREMITY/Right Ankle;  Surgeon: Cammy CopaScott Gregory Dean, MD;  [facility];  Service: Orthopedics;  Laterality: Right;   Family History: History reviewed. No pertinent family history. Family Psychiatric  History: none reported Social History:  Social History   Substance and Sexual Activity  Alcohol Use Yes     Social History   Substance and Sexual Activity  Drug Use Yes  . Types: Cocaine, Marijuana   Comment: 10/13/2015 "I've tried alot; I don't have any habits"    Social History   Socioeconomic History  . Marital status: Single    Spouse name: None  . Number of children: None  . Years of education: None  . Highest education level: None  Social Needs  . Financial resource strain: None  . Food insecurity - worry: None  . Food insecurity - inability: None  . Transportation needs - medical: None  . Transportation needs - non-medical: None  Occupational History  . None  Tobacco Use  . Smoking status: Never Smoker  . Smokeless tobacco: Never Used  Substance and Sexual Activity  . Alcohol use: Yes  . Drug use: Yes    Types: Cocaine, Marijuana    Comment: 10/13/2015 "I've tried alot; I don't have any habits"  . Sexual activity: None  Other Topics Concern  . None  Social History Narrative  . None   Additional Social History:                         Sleep: Fair  Appetite:  Fair  Current Medications: Current Facility-Administered Medications  Medication Dose Route Frequency Provider Last Rate Last Dose  . acetaminophen (TYLENOL) tablet 650 mg  650 mg Oral Q6H PRN Torrie Namba B, MD      . albuterol (PROVENTIL) (2.5 MG/3ML) 0.083% nebulizer solution 2.5 mg  2.5 mg Inhalation Q6H PRN Aliyah Abeyta B, MD      . alum & mag hydroxide-simeth (MAALOX/MYLANTA) 200-200-20 MG/5ML suspension 30 mL  30 mL Oral Q4H PRN Bowe Sidor B, MD      . ARIPiprazole (ABILIFY) tablet 10 mg  10 mg Oral Daily Jaidan Prevette B, MD   10 mg at 12/29/16 1633  . ARIPiprazole ER SRER 400 mg  400 mg Intramuscular Q28 days Rilan Eiland,  Gregori Abril B, MD   400 mg at 12/29/16 1634  . aspirin chewable tablet 81 mg  81 mg Oral Daily Neal Oshea B, MD   81 mg at 12/30/16 0854  . atorvastatin (LIPITOR) tablet 20 mg  20 mg Oral q1800 Oralee Rapaport B, MD   20 mg at 12/29/16 1635  . carbamazepine (TEGRETOL) tablet 200 mg  200 mg Oral BID AC & HS Cammie Faulstich B, MD   Stopped at 12/29/16 2122  . chlorproMAZINE (THORAZINE) tablet 50 mg  50 mg Oral QID PRN Danyela Posas B, MD      . gabapentin (NEURONTIN) capsule 600 mg  600 mg Oral TID Escarlet Saathoff B, MD   600 mg at 12/29/16 1153  . magnesium hydroxide (MILK OF MAGNESIA) suspension 30 mL  30 mL Oral Daily PRN Brewster Wolters B, MD      . nicotine (NICODERM CQ - dosed in mg/24 hours) patch 21 mg  21 mg Transdermal Q0600 Witney Huie B, MD      . nitroGLYCERIN (NITROSTAT) SL tablet 0.4 mg  0.4 mg Sublingual Q5 min PRN Johnathin Vanderschaaf B, MD      .  terbinafine (LAMISIL) 1 % cream   Topical BID Clapacs, John T, MD      . traZODone (DESYREL) tablet 100 mg  100 mg Oral QHS Devynn Scheff B, MD   Stopped at 12/29/16 2123    Lab Results: No results found for this or any previous visit (from the past 48 hour(s)).  Blood Alcohol level:  Lab Results  Component Value Date   ETH <10 12/23/2016   ETH <5 09/19/2015    Metabolic Disorder Labs: Lab Results  Component Value Date   HGBA1C 5.6 10/12/2015   MPG 114 10/12/2015   No results found for: PROLACTIN Lab Results  Component Value Date   CHOL 165 12/28/2016   TRIG 58 12/28/2016   HDL 56 12/28/2016   CHOLHDL 2.9 12/28/2016   VLDL 12 12/28/2016   LDLCALC 97 12/28/2016   LDLCALC 96 10/13/2015    Physical Findings: AIMS:  , ,  ,  ,    CIWA:  CIWA-Ar Total: 0 COWS:  COWS Total Score: 1  Musculoskeletal: Strength & Muscle Tone: within normal limits Gait & Station: normal Patient leans: N/A  Psychiatric Specialty Exam: Physical Exam  Nursing note and vitals reviewed. Psychiatric:  His speech is normal. His affect is blunt. He is actively hallucinating. Thought content is paranoid and delusional. Cognition and memory are normal. He expresses impulsivity.    Review of Systems  Neurological: Negative.   Psychiatric/Behavioral: Positive for hallucinations and substance abuse.  All other systems reviewed and are negative.   Blood pressure (!) 137/91, pulse 91, temperature 98 F (36.7 C), temperature source Oral, resp. rate 18, height 6\' 7"  (2.007 m), weight 95.3 kg (210 lb), SpO2 98 %.Body mass index is 23.66 kg/m.  General Appearance: Casual  Eye Contact:  Good  Speech:  Clear and Coherent  Volume:  Normal  Mood:  Anxious  Affect:  Appropriate  Thought Process:  Goal Directed and Descriptions of Associations: Intact  Orientation:  Full (Time, Place, and Person)  Thought Content:  Delusions, Hallucinations: Auditory and Paranoid Ideation  Suicidal Thoughts:  No  Homicidal Thoughts:  No  Memory:  Immediate;   Fair Recent;   Fair Remote;   Fair  Judgement:  Poor  Insight:  Lacking  Psychomotor Activity:  Normal  Concentration:  Concentration: Fair and Attention Span: Fair  Recall:  Fis[employer]Fair  Fund of Knowledge:  Fair  Language:  Fair  Akathisia:  No  Handed:  Right  AIMS (if indicated):     Assets:  Communication Skills Desire for Improvement Physical Health Resilience Social Support  ADL's:  Intact  Cognition:  WNL  Sleep:  Number of Hours: 8     Treatment Plan Summary: Daily contact with patient to assess and evaluate symptoms and progress in treatment and Medication management   [name] is a 46 year old male with a history of schizophrenia admitted for altered mental status in the context of medication noncompliance and substance abuse.  #Psychosis -continue Tegretol 200 mg BID -discontinue Zyprexa -start Abilify 10 mg daily -start Abilify maintena 400 mg every 28 days, first injection on 11/30  #Insomnia -continue Trazodone  #Substance  abuse -patient minimizes problems and declines treatment  #COPD -continue Albuterol  #CAD -continue ASA, Nitrostat, Lipitor  #Metabolic syndrome monitoring -Lipid panel, TSH and HgbA1C are normal -EKG, QTc 442  #Smoking -Nicotine patch is available  #Disposition -TBE    Kristine LineaJolanta Cynitha Berte, MD 12/30/2016, 2:55 PM

## 2016-12-30 NOTE — Plan of Care (Signed)
Patient slept for Estimated Hours of 8; Precautionary checks every 15 minutes for safety maintained, room free of safety hazards, patient sustains no injury or falls during this shift.  

## 2016-12-30 NOTE — Tx Team (Addendum)
Interdisciplinary Treatment and Diagnostic Plan Update  12/30/2016 Time of Session: [address]10[address] Brett Salinas MRN: [id number]  Principal Diagnosis: Schizoaffective disorder, bipolar type [facility](HCC)  Secondary Diagnoses: Principal Problem:   Schizoaffective disorder, bipolar type (HCC) Active Problems:   CAD -S/P PCI LAD/DES 10/13/15   COPD (chronic obstructive pulmonary disease) (HCC)   Dyslipidemia   Amphetamine use disorder, severe (HCC)   Tobacco use disorder   Current Medications:  Current Facility-Administered Medications  Medication Dose Route Frequency Provider Last Rate Last Dose  . acetaminophen (TYLENOL) tablet 650 mg  650 mg Oral Q6H PRN Pucilowska, Jolanta B, MD      . albuterol (PROVENTIL) (2.5 MG/3ML) 0.083% nebulizer solution 2.5 mg  2.5 mg Inhalation Q6H PRN Pucilowska, Jolanta B, MD      . alum & mag hydroxide-simeth (MAALOX/MYLANTA) 200-200-20 MG/5ML suspension 30 mL  30 mL Oral Q4H PRN Pucilowska, Jolanta B, MD      . ARIPiprazole (ABILIFY) tablet 10 mg  10 mg Oral Daily Pucilowska, Jolanta B, MD   10 mg at 12/29/16 1633  . ARIPiprazole ER SRER 400 mg  400 mg Intramuscular Q28 days Pucilowska, Jolanta B, MD   400 mg at 12/29/16 1634  . aspirin chewable tablet 81 mg  81 mg Oral Daily Pucilowska, Jolanta B, MD   81 mg at 12/30/16 0854  . atorvastatin (LIPITOR) tablet 20 mg  20 mg Oral q1800 Pucilowska, Jolanta B, MD   20 mg at 12/29/16 1635  . carbamazepine (TEGRETOL) tablet 200 mg  200 mg Oral BID AC & HS Pucilowska, Jolanta B, MD   Stopped at 12/29/16 2122  . chlorproMAZINE (THORAZINE) tablet 50 mg  50 mg Oral QID PRN Pucilowska, Jolanta B, MD      . gabapentin (NEURONTIN) capsule 600 mg  600 mg Oral TID Pucilowska, Jolanta B, MD   600 mg at 12/29/16 1153  . magnesium hydroxide (MILK OF MAGNESIA) suspension 30 mL  30 mL Oral Daily PRN Pucilowska, Jolanta B, MD      . nicotine (NICODERM CQ - dosed in mg/24 hours) patch 21 mg  21 mg Transdermal Q0600 Pucilowska, Jolanta B, MD       . nitroGLYCERIN (NITROSTAT) SL tablet 0.4 mg  0.4 mg Sublingual Q5 min PRN Pucilowska, Jolanta B, MD      . terbinafine (LAMISIL) 1 % cream   Topical BID Clapacs, John T, MD      . traZODone (DESYREL) tablet 100 mg  100 mg Oral QHS Pucilowska, Jolanta B, MD   Stopped at 12/29/16 2123   PTA Medications: Medications Prior to Admission  Medication Sig Dispense Refill Last Dose  . albuterol (VENTOLIN HFA) 108 (90 Base) MCG/ACT inhaler Inhale 2 puffs into the lungs every 6 (six) hours as needed for wheezing or shortness of breath. 54 g 3 unknown at prn  . aspirin EC 81 MG tablet Take 1 tablet (81 mg total) by mouth daily. (Patient not taking: Reported on 12/25/2016) 90 tablet 3 Not Taking at Unknown time  . atorvastatin (LIPITOR) 20 MG tablet Take 1 tablet (20 mg total) by mouth daily. 90 tablet 2 Taking  . carbamazepine (TEGRETOL) 200 MG tablet Take 200 mg by mouth at bedtime.   unknown  . fluticasone (FLONASE) 50 MCG/ACT nasal spray Place 2 sprays into both nostrils daily. 16 g 6 unknown at prn  . gabapentin (NEURONTIN) 600 MG tablet Take 1 tablet (600 mg total) by mouth 3 (three) times daily. 90 tablet 1 unknown  . loratadine (CLARITIN) 10  MG tablet Take 1 tablet (10 mg total) by mouth daily. (Patient not taking: Reported on 12/25/2016) 30 tablet 11 Not Taking at Unknown time  . Multiple Vitamin (MULTIVITAMIN WITH MINERALS) TABS tablet Take 1 tablet by mouth daily. 30 tablet 0   . nitroGLYCERIN (NITROSTAT) 0.4 MG SL tablet Place 1 tablet (0.4 mg total) under the tongue every 5 (five) minutes x 3 doses as needed for chest pain. 30 tablet 12  at prn  . OLANZapine (ZYPREXA) 10 MG tablet Take 10 mg by mouth at bedtime.   Past Week at Unknown time  . terbinafine (LAMISIL) 1 % cream Apply topically daily. 30 g 0     Patient Stressors: Other: Psychosis and homeless  Patient Strengths: Ability for insight Average or above average intelligence Capable of independent living Communication  skills  Treatment Modalities: Medication Management, Group therapy, Case management,  1 to 1 session with clinician, Psychoeducation, Recreational therapy.   Physician Treatment Plan for Primary Diagnosis: Schizoaffective disorder, bipolar type (HCC) Long Term Goal(s): Improvement in symptoms so as ready for discharge Improvement in symptoms so as ready for discharge   Short Term Goals: Ability to identify changes in lifestyle to reduce recurrence of condition will improve Ability to verbalize feelings will improve Ability to disclose and discuss suicidal ideas Ability to demonstrate self-control will improve Ability to identify and develop effective coping behaviors will improve Compliance with prescribed medications will improve Ability to identify triggers associated with substance abuse/mental health issues will improve Ability to identify changes in lifestyle to reduce recurrence of condition will improve Ability to demonstrate self-control will improve Ability to identify triggers associated with substance abuse/mental health issues will improve  Medication Management: Evaluate patient's response, side effects, and tolerance of medication regimen.  Therapeutic Interventions: 1 to 1 sessions, Unit Group sessions and Medication administration.  Evaluation of Outcomes: Progressing  Physician Treatment Plan for Secondary Diagnosis: Principal Problem:   Schizoaffective disorder, bipolar type (HCC) Active Problems:   CAD -S/P PCI LAD/DES 10/13/15   COPD (chronic obstructive pulmonary disease) (HCC)   Dyslipidemia   Amphetamine use disorder, severe (HCC)   Tobacco use disorder  Long Term Goal(s): Improvement in symptoms so as ready for discharge Improvement in symptoms so as ready for discharge   Short Term Goals: Ability to identify changes in lifestyle to reduce recurrence of condition will improve Ability to verbalize feelings will improve Ability to disclose and discuss  suicidal ideas Ability to demonstrate self-control will improve Ability to identify and develop effective coping behaviors will improve Compliance with prescribed medications will improve Ability to identify triggers associated with substance abuse/mental health issues will improve Ability to identify changes in lifestyle to reduce recurrence of condition will improve Ability to demonstrate self-control will improve Ability to identify triggers associated with substance abuse/mental health issues will improve     Medication Management: Evaluate patient's response, side effects, and tolerance of medication regimen.  Therapeutic Interventions: 1 to 1 sessions, Unit Group sessions and Medication administration.  Evaluation of Outcomes: Progressing   RN Treatment Plan for Primary Diagnosis: Schizoaffective disorder, bipolar type (HCC) Long Term Goal(s): Knowledge of disease and therapeutic regimen to maintain health will improve  Short Term Goals: Ability to verbalize feelings will improve, Ability to identify and develop effective coping behaviors will improve and Compliance with prescribed medications will improve  Medication Management: RN will administer medications as ordered by provider, will assess and evaluate patient's response and provide education to patient for prescribed medication. RN will report  any adverse and/or side effects to prescribing provider.  Therapeutic Interventions: 1 on 1 counseling sessions, Psychoeducation, Medication administration, Evaluate responses to treatment, Monitor vital signs and CBGs as ordered, Perform/monitor CIWA, COWS, AIMS and Fall Risk screenings as ordered, Perform wound care treatments as ordered.  Evaluation of Outcomes: Progressing   LCSW Treatment Plan for Primary Diagnosis: Schizoaffective disorder, bipolar type (HCC) Long Term Goal(s): Safe transition to appropriate next level of care at discharge, Engage patient in therapeutic group  addressing interpersonal concerns.  Short Term Goals: Engage patient in aftercare planning with referrals and resources, Increase social support and Increase skills for wellness and recovery  Therapeutic Interventions: Assess for all discharge needs, 1 to 1 time with Social worker, Explore available resources and support systems, Assess for adequacy in community support network, Educate family and significant other(s) on suicide prevention, Complete Psychosocial Assessment, Interpersonal group therapy.  Evaluation of Outcomes: Progressing   Progress in Treatment: Attending groups: Yes. Participating in groups: Yes. Taking medication as prescribed: Yes. Toleration medication: Yes. Family/Significant other contact made: Yes, individual(s) contacted:  friend/mentor Patient understands diagnosis: Yes. Discussing patient identified problems/goals with staff: Yes. Medical problems stabilized or resolved: Yes. Denies suicidal/homicidal ideation: Yes. Issues/concerns per patient self-inventory: No. Other: none  New problem(s) identified: No, Describe:  none  New Short Term/Long Term Goal(s): Pt goal is to obtain better housing and make progress on getting his disability.  Discharge Plan or Barriers: [facility]  Reason for Continuation of Hospitalization: Medication stabilization  Estimated Length of Stay:1-3 days.  Recreational Therapy: Patient Stressors: Homeless, start seeing and hearing things Patient Goal: Patient will identify 3 healthy leisure activities that can be utilized post discharge x 5 days.   Attendees: Patient:Brett Salinas 12/30/2016  Physician: Dr. Jennet MaduroPucilowska, MD 12/30/2016   Nursing: Marjo Bickerhristy Richards, RN 12/30/2016   RN Care Manager: 12/30/2016   Social Worker: Daleen SquibbGreg Wierda, LCSW 12/30/2016   Recreational Therapist: Garret ReddishShay Kla Bily, LRT/CTRS 12/30/2016   Other: Huey RomansSonya Carter, LCSW 12/30/2016   Other:  12/30/2016   Other: 12/30/2016          Scribe for Treatment Team: Lorri FrederickWierda, Gregory Jon, LCSW 12/30/2016 11:52 AM

## 2016-12-30 NOTE — [facility] Group Notes (Signed)
[facility] Group Notes:  (Nursing/MHT/Case Management/Adjunct)  Date:  12/30/2016  Time:  9:22 PM  Type of Therapy:  Evening Wrap-up Group  Participation Level:  Active  Participation Quality:  Appropriate and Attentive  Affect:  Appropriate  Cognitive:  Alert and Appropriate  Insight:  Appropriate, Good and Improving  Engagement in Group:  Developing/Improving and Engaged  Modes of Intervention:  Discussion, Socialization and Support  Summary of Progress/Problems:  Tomasita MorrowChelsea Nanta Thomasena Vandenheuvel 12/30/2016, 9:22 PM

## 2016-12-30 NOTE — Plan of Care (Signed)
Pt denies SI/ AVH at this time, pt slept over 7 hrs last night

## 2016-12-30 NOTE — [facility] Group Notes (Signed)
[facility] Group Notes:  (Nursing/MHT/Case Management/Adjunct)  Date:  12/29/2016 Time:  8:40 PM  Type of Therapy:  Psychoeducational Skills  Participation Level:  Did Not Attend  Summary of Progress/Problems:  Brett MilroyLaquanda Y Salinas Self 12/30/2016, 7:36 PM

## 2016-12-30 NOTE — Progress Notes (Signed)
D: Pt denies SI/HI/AVH. Pt is pleasant and cooperative. Pt refused his medications this evening stating he did not need them. Pt appears to be in denial about his Tx . Pt stated there was nothing wrong with him. Pt seen on the unit this evening  A: Pt was offered support and encouragement.  Pt was encourage to attend groups. Q 15 minute checks were done for safety.   R:Pt attends groups and interacts well with peers and staff. safety maintained on unit.
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
## 2016-12-30 NOTE — [facility] Group Notes (Signed)
LCSW Group Therapy Note  12/30/2016 9:30AM  Type of Therapy and Topic:  Group Therapy:  Feelings around Relapse and Recovery  Participation Level:  Active   Description of Group:    Patients in this group will discuss emotions they experience before and after a relapse. They will process how experiencing these feelings, or avoidance of experiencing them, relates to having a relapse. Facilitator will guide patients to explore emotions they have related to recovery. Patients will be encouraged to process which emotions are more powerful. They will be guided to discuss the emotional reaction significant others in their lives may have to their relapse or recovery. Patients will be assisted in exploring ways to respond to the emotions of others without this contributing to a relapse.  Therapeutic Goals: 1. Patient will identify two or more emotions that lead to a relapse for them 2. Patient will identify two emotions that result when they relapse 3. Patient will identify two emotions related to recovery 4. Patient will demonstrate ability to communicate their needs through discussion and/or role plays   Summary of Patient Progress: Brett Salinas was able to actively participate in process group. He was able to identify both negative and positive emotions that he has experienced in the past that have led to him decompensating (become more paranoid).  Brett Salinas,Brett did not share anything about recovery efforts as well as choosing to engage in discussion about the negative experiences that he has had while being in the hospital.      Therapeutic Modalities:   Cognitive Behavioral Therapy Solution-Focused Therapy Assertiveness Training Relapse Prevention Therapy   Brett FrameSonya S Martavious Hartel, LCSW 12/30/2016 2:00 PM

## 2016-12-30 NOTE — Progress Notes (Signed)
Recreation Therapy Notes  Date: 12/30/2016  Time: 1:00 pm   Location: Craft Room  Behavioral response: Appropriate  Intervention Topic: Problem Solving  Discussion/Intervention: Group content on today was focused on problem solving. The group described what problem solving is. Patients expressed how problems affect them and how they deal with problems. Individuals identified healthy ways to deal with problems. Patients explained what normally happens to them when they do not deal with problems. The group expressed reoccurring problems for them. The group participated in the intervention "Word Scramble" with their peers and experienced dealing with problems alone versus asking for help with a problem.  Clinical Observations/Feedback:  Patient came to group late due to unknown reasons. While in group he participated in the intervention and was social with peers and staff. He stated that problem solving links together with brain storming, results, evaluating, decisions, situations, strategies and discovering.   Kylinn Shropshire LRT/CTRS         Delania Ferg 12/30/2016 2:00 PM
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
## 2016-12-30 NOTE — Progress Notes (Signed)
Patient was in room resting and did not want to be disturbed at this time.

## 2016-12-31 MED ORDER — DICLOFENAC SODIUM 1 % TD GEL
2.0000 g | Freq: Four times a day (QID) | TRANSDERMAL | Status: DC | PRN
  Administered 2016-12-31 – 2017-01-01 (×2): 2 g via TOPICAL
  Filled 2016-12-31: qty 100

## 2016-12-31 NOTE — [facility] Group Notes (Signed)
[facility] Group Notes:  (Nursing/MHT/Case Management/Adjunct)  Date:  12/31/2016  Time:  9:20 PM  Type of Therapy:  Group Therapy  Participation Level:  Active  Participation Quality:  Redirectable  Affect:  Questionings about why can't take spoon to room,complaining nobdy is cleaning his room.  Cognitive:  Alert  Insight:  Good  Engagement in Group:  Distracting  Modes of Intervention:  Exploration  Summary of Progress/Problems:  Brett NeerJackie L [name] 12/31/2016, 9:20 PM

## 2016-12-31 NOTE — Progress Notes (Signed)
D- Patient alert and oriented. Patient presents in a pleasant mood. Patient denies SI, HI, AVH, at this time. Patient reports having pain in his back "from that shot they gave me at the hospital" and rates his pain level as a "7/10". Patient states that he will see what the gabapentin does for his pain level. Patient also reports that his throat is sore. Patient's goal for today is "leaving", which he will meet his goal by "ask to leave".  A- Scheduled medications administered to patient, per MD orders. Support and encouragement provided.  Routine safety checks conducted every 15 minutes.  Patient informed to notify staff with problems or concerns.  R- No adverse drug reactions noted. Patient contracts for safety at this time. Patient compliant with medications and treatment plan. Patient receptive, calm, and cooperative. Patient interacts well with others on the unit.  Patient remains safe at this time.

## 2016-12-31 NOTE — Plan of Care (Signed)
Pt. Able to remain safe while on the unit. Pt. Denies all psych symptoms to this writer this evening. Pt. Non-compliant with evening medications, but verbalizes education on teachings. Educational reinforcement needed for compliance with medications. Pt. Participates in unit activities and groups. Pt. Socializes with other patients in the milieu. Pt. Reports eating, "good" and sleeping, "good".

## 2016-12-31 NOTE — [facility] Group Notes (Signed)
12/31/2016 1:15pm  Type of Therapy and Topic:  Group Therapy:  Healthy Self Image and Positive Change  Participation Level:  Active   Description of Group:  In this group, patients will compare and contrast their current "I am...." statements to the visions they identify as desirable for their lives.  Patients discuss fears and how they can make positive changes in their cognitions that will positively impact their behaviors.  Facilitator played a motivational 3-minute speech and patients were left with the task of thinking about what "I am...." statements they can start using in their lives immediately.  Therapeutic Goals: 1. Patient will state their current self-perception as expressed in an "I Am" statement 2. Patient will contrast this with their desired vision for their live 3. Patient will identify 3 fears that negatively impact their behavior 4. Patient will discuss cognitive distortions that stem from their fears 5. Patient will verbalize statements that challenge their cognitive distortions  Summary of Patient Progress:  The patient expressed his feelings about his stay in the hospital. He shared that he is here due to other people lack of understanding and compassion. Pt expressed in an "I am" statement, " I am hopeful, I am tired". Pt fully engaged in group discussion.     Therapeutic Modalities Cognitive Behavioral Therapy Motivational Interviewing  Brett Salinas  CUEBAS-COLON, LCSW 12/31/2016 11:34 AM

## 2016-12-31 NOTE — Progress Notes (Signed)
[facility] MD Progress Note  12/31/2016 1:32 PM Brett Salinas  MRN:  [id number]  Subjective:   Brett Salinas is less paranoid today.  His is laying in bed.  He states he has back pain and right leg pain.  He wants his cream from home.  He didn't sleep well, "my room was 90 degrees", states his depression is also still present and it's his normal.  He did take his morning.  Treatment plan. We will continue to offer Tegretol and oral Abilify.  Social/disposition. 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   Principal Problem: Schizoaffective disorder, bipolar type (HCC) Diagnosis:   Patient Active Problem List   Diagnosis Date Noted  . Tobacco use disorder [F17.200] 12/29/2016  . COPD (chronic obstructive pulmonary disease) (HCC) [J44.9] 12/28/2016  . Dyslipidemia [E78.5] 12/28/2016  . 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 Drug abuse (HCC) [F19.10]   . Agitation [R45.1] 12/24/2016  . Aggression [R46.89] 12/24/2016  . Altered mental status [R41.82] 12/23/2016  . History of ED visit for Medical clearance for incarceration for alleged aggressive behavior and crack cocaine use [Z00.8] 09/05/2016  . Right knee pain [M25.561] 07/20/2016  . Essential hypertension [I10] 11/30/2015  . CAD -S/P PCI LAD/DES 10/13/15 [I25.10, Z98.61] 10/14/2015  . Wound infection- for I&D achilles wound 10/14/15 [T14.8XXA, L08.9] 10/14/2015  . Cocaine abuse-drug sceen positive, History of [F14.10] 10/14/2015  . Schizoaffective disorder, bipolar type (HCC) [F25.0] 10/14/2015  . Infection [B99.9] 10/14/2015  . NSTEMI (non-ST elevated myocardial infarction) (HCC) [I21.4]   . Chest pain [R07.9] 10/12/2015  . Achilles tendon rupture-surgery 09/19/15 [S86.019A] 09/19/2015   Total Time spent with patient: 30 minutes  Past Psychiatric History: schizophrenia  Past Medical History:  Past Medical History:  Diagnosis Date  . Achilles tendon rupture-surgery 09/19/15  09/19/2015  . Altered mental status 12/23/2016  . Arthritis    "hips, knees, back" (10/13/2015)  . Bipolar disorder (HCC)   . CAD -S/P PCI LAD/DES 10/13/15 10/14/2015  . CHF (congestive heart failure) (HCC)   . Cocaine abuse-drug sceen positive, History of 10/14/2015  . Coronary artery disease   . Depression   . DVT (deep venous thrombosis) (HCC) 09/2015   right  . Essential hypertension 11/30/2015  . Heart murmur   . High cholesterol   . Hypertension   . Myocardial infarction (HCC) 10/12/2015 X 2-3  . NSTEMI (non-ST elevated myocardial infarction) (HCC)   . Rectal myiasis   . Schizoaffective disorder, bipolar type (HCC) 10/14/2015  . Schizoaffective disorder, bipolar type (HCC)   . Sciatica     Past Surgical History:  Procedure Laterality Date  . CARDIAC CATHETERIZATION N/A 10/13/2015   Procedure: Left Heart Cath and Coronary Angiography;  Surgeon: Corky Crafts, MD;  Location: [facility] INVASIVE CV LAB;  Service: Cardiovascular;  Laterality: N/A;  . CARDIAC CATHETERIZATION N/A 10/13/2015   Procedure: Coronary Stent Intervention;  Surgeon: Corky Crafts, MD;  Location: [facility] INVASIVE CV LAB;  Service: Cardiovascular;  Laterality: N/A;  . CARDIAC CATHETERIZATION N/A 10/13/2015   Procedure: Intravascular Ultrasound/IVUS;  Surgeon: Corky Crafts, MD;  Location: [facility] INVASIVE CV LAB;  Service: Cardiovascular;  Laterality: N/A;  . CORONARY ANGIOPLASTY WITH STENT PLACEMENT  10/13/2015  . I&D EXTREMITY Right 09/19/2015   Procedure: IRRIGATION AND DEBRIDEMENT ANKLE LACERATIONS POSSIBLE TENDON REPAIR;  Surgeon: Cammy Copa, MD;  Location: [facility] OR;  Service: Orthopedics;  Laterality: Right;  . I&D EXTREMITY Right 10/14/2015   Procedure: IRRIGATION AND DEBRIDEMENT  EXTREMITY/Right Ankle;  Surgeon: Cammy CopaScott Gregory Dean, MD;  [facility];  Service: Orthopedics;  Laterality: Right;   Family History: History reviewed. No pertinent family history. Family Psychiatric  History: none  reported Social History:  Social History   Substance and Sexual Activity  Alcohol Use Yes     Social History   Substance and Sexual Activity  Drug Use Yes  . Types: Cocaine, Marijuana   Comment: 10/13/2015 "I've tried alot; I don't have any habits"    Social History   Socioeconomic History  . Marital status: Single    Spouse name: None  . Number of children: None  . Years of education: None  . Highest education level: None  Social Needs  . Financial resource strain: None  . Food insecurity - worry: None  . Food insecurity - inability: None  . Transportation needs - medical: None  . Transportation needs - non-medical: None  Occupational History  . None  Tobacco Use  . Smoking status: Never Smoker  . Smokeless tobacco: Never Used  Substance and Sexual Activity  . Alcohol use: Yes  . Drug use: Yes    Types: Cocaine, Marijuana    Comment: 10/13/2015 "I've tried alot; I don't have any habits"  . Sexual activity: None  Other Topics Concern  . None  Social History Narrative  . None   Additional Social History:                         Sleep: Poor  Appetite:  Fair  Current Medications: Current Facility-Administered Medications  Medication Dose Route Frequency Provider Last Rate Last Dose  . acetaminophen (TYLENOL) tablet 650 mg  650 mg Oral Q6H PRN Pucilowska, Jolanta B, MD      . albuterol (PROVENTIL) (2.5 MG/3ML) 0.083% nebulizer solution 2.5 mg  2.5 mg Inhalation Q6H PRN Pucilowska, Jolanta B, MD      . alum & mag hydroxide-simeth (MAALOX/MYLANTA) 200-200-20 MG/5ML suspension 30 mL  30 mL Oral Q4H PRN Pucilowska, Jolanta B, MD      . ARIPiprazole (ABILIFY) tablet 10 mg  10 mg Oral Daily Pucilowska, Jolanta B, MD   10 mg at 12/29/16 1633  . ARIPiprazole ER SRER 400 mg  400 mg Intramuscular Q28 days Pucilowska, Jolanta B, MD   400 mg at 12/29/16 1634  . aspirin chewable tablet 81 mg  81 mg Oral Daily Pucilowska, Jolanta B, MD   81 mg at 12/31/16 0826  .  atorvastatin (LIPITOR) tablet 20 mg  20 mg Oral q1800 Pucilowska, Jolanta B, MD   20 mg at 12/29/16 1635  . carbamazepine (TEGRETOL) tablet 200 mg  200 mg Oral BID AC & HS Pucilowska, Jolanta B, MD   Stopped at 12/29/16 2122  . chlorproMAZINE (THORAZINE) tablet 50 mg  50 mg Oral QID PRN Pucilowska, Jolanta B, MD      . gabapentin (NEURONTIN) capsule 600 mg  600 mg Oral TID Pucilowska, Jolanta B, MD   600 mg at 12/31/16 0826  . magnesium hydroxide (MILK OF MAGNESIA) suspension 30 mL  30 mL Oral Daily PRN Pucilowska, Jolanta B, MD      . multivitamin with minerals tablet 1 tablet  1 tablet Oral Daily Pucilowska, Jolanta B, MD   1 tablet at 12/31/16 0825  . nicotine (NICODERM CQ - dosed in mg/24 hours) patch 21 mg  21 mg Transdermal Q0600 Pucilowska, Jolanta B, MD      . nitroGLYCERIN (NITROSTAT) SL tablet 0.4 mg  0.4 mg Sublingual Q5 min PRN Pucilowska, Jolanta B, MD      . terbinafine (LAMISIL) 1 % cream   Topical BID Clapacs, John T, MD      . traZODone (DESYREL) tablet 100 mg  100 mg Oral QHS Pucilowska, Jolanta B, MD   Stopped at 12/29/16 2123    Lab Results: No results found for this or any previous visit (from the past 48 hour(s)).  Blood Alcohol level:  Lab Results  Component Value Date   ETH <10 12/23/2016   ETH <5 09/19/2015    Metabolic Disorder Labs: Lab Results  Component Value Date   HGBA1C 5.6 10/12/2015   MPG 114 10/12/2015   No results found for: PROLACTIN Lab Results  Component Value Date   CHOL 165 12/28/2016   TRIG 58 12/28/2016   HDL 56 12/28/2016   CHOLHDL 2.9 12/28/2016   VLDL 12 12/28/2016   LDLCALC 97 12/28/2016   LDLCALC 96 10/13/2015    Physical Findings: AIMS:  , ,  ,  ,    CIWA:  CIWA-Ar Total: 0 COWS:  COWS Total Score: 1  Musculoskeletal: Strength & Muscle Tone: within normal limits Gait & Station: normal Patient leans: N/A  Psychiatric Specialty Exam: Physical Exam  Nursing note and vitals reviewed. Psychiatric: His speech is normal.  His affect is blunt. He is actively hallucinating. Thought content is paranoid and delusional. Cognition and memory are normal. He expresses impulsivity.    Review of Systems  Neurological: Negative.   Psychiatric/Behavioral: Positive for hallucinations and substance abuse.  All other systems reviewed and are negative.   Blood pressure (!) 121/93, pulse 98, temperature 98.8 F (37.1 C), temperature source Oral, resp. rate 18, height 6\' 7"  (2.007 m), weight 95.3 kg (210 lb), SpO2 98 %.Body mass index is 23.66 kg/m.  General Appearance: Casual  Eye Contact:  Good  Speech:  Clear and Coherent  Volume:  Normal  Mood:  Irritable  Affect:  Appropriate  Thought Process:  Goal Directed and Descriptions of Associations: Intact  Orientation:  Full (Time, Place, and Person)  Thought Content:  Delusions, Hallucinations: Auditory and Paranoid Ideation  Suicidal Thoughts:  No  Homicidal Thoughts:  No  Memory:  Immediate;   Fair Recent;   Fair Remote;   Fair  Judgement:  Poor  Insight:  Lacking  Psychomotor Activity:  Normal  Concentration:  Concentration: Fair and Attention Span: Fair  Recall:  [employer]  Fund of Knowledge:  Fair  Language:  Fair  Akathisia:  No  Handed:  Right  AIMS (if indicated):     Assets:  Communication Skills Desire for Improvement Physical Health Resilience Social Support  ADL's:  Intact  Cognition:  WNL  Sleep:  Number of Hours: 8     Treatment Plan Summary: Daily contact with patient to assess and evaluate symptoms and progress in treatment and Medication management   Brett Salinas is a 46 year old male with a history of schizophrenia admitted for altered mental status in the context of medication noncompliance and substance abuse.  #Psychosis -continue Tegretol 200 mg BID -continue Abilify 10 mg daily -bilify maintena 400 mg every 28 days, first injection given11/30  #Insomnia -continue Trazodone  #Substance abuse -patient minimizes problems and  declines treatment  #Back and leg pain -found voltaren 1% gel in his home meds and restarted   #COPD -continue Albuterol  #CAD -continue ASA, Nitrostat, Lipitor  #Metabolic syndrome monitoring -Lipid panel, TSH and HgbA1C are normal -EKG, QTc 442  #Smoking -Nicotine  patch is available  #Disposition -TBE    Cindee Lame, MD 12/31/2016, 1:32 PM

## 2016-12-31 NOTE — Plan of Care (Signed)
Patient demonstrates understanding of the general information provided to him. Patient denies SI/HI/AVH at this time. Patient demonstrates understanding of his medication regimen. Patient reports that he didn't sleep well last night because "my room was ninety degrees, so you tell me how I slept". Patient rates his depression as a "4/10" stating "I stayed depressed". Patient rates his anxiety level as a "2/10" but doesn't specify to this writer what makes him anxious.

## 2017-01-01 NOTE — Progress Notes (Signed)
D:Pt denies SI/HI/AVH. Pt is pleasant and cooperative upon interaction. Pt. Expressed vague throat soreness today that went away with warm fluids.   A: Q x 15 minute observation checks were completed for safety. Patient was provided with education. Patient  was encourage to attend groups, participate in unit activities and continue with plan of care.   R:Pt. Able to remain safe while on the unit. Pt. Non-compliant with evening medications, but verbalizes education on teachings. Educational reinforcement needed for compliance with medications. Pt. Participates in unit activities and groups. Pt. Socializes with other patients in the milieu. Pt. Reports eating, "good" and sleeping, "good". Pt. Reports feeling better today then he did yesterday.              Patient slept for Estimated Hours of 6.30; Precautionary checks every 15 minutes for safety maintained, room free of safety hazards, patient sustains no injury or falls during this shift.

## 2017-01-01 NOTE — Plan of Care (Signed)
Patient reports that he slept well last night and verbalizes understanding of general information provided to him. Patient understands his prescribed therapeutic regimen but feels as if he doesn't need anything other than the multivitamin. Patient states that he doesn't need his po abilify because " I received the 30 day injection and I don't have any problems with my cholesterol nor is my blood pressure high, it dropped when I stood up". Patient denies SI/HI/AVH at this time. Patient clinical measurements have improved and has not experienced any complications. Patient has maintained adequate nutrition during this visit and patient comfort level has improved. Patient has remained free from injury and is safe on the unit.

## 2017-01-01 NOTE — Progress Notes (Signed)
[facility] MD Progress Note  01/01/2017 3:28 PM Jaymere Alen  MRN:  [id number]  Subjective:   Mr. Waide is actively psychotic and mildly tangential.  He states he only took his multitvitamin this morning because the Abilify shot is having an effect of him, it is slowing him down.  He states abilify helps with his delusional thoughts,, he starts rambling, saying he needs to stay away from drugs, he needs to go home and clean his house.  He also says he has a 3/10 sore throat.    Treatment plan. We will continue to offer Tegretol and oral Abilify.  Social/disposition. 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   Principal Problem: Schizoaffective disorder, bipolar type (HCC) Diagnosis:   Patient Active Problem List   Diagnosis Date Noted  . Tobacco use disorder [F17.200] 12/29/2016  . COPD (chronic obstructive pulmonary disease) (HCC) [J44.9] 12/28/2016  . Dyslipidemia [E78.5] 12/28/2016  . 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 Drug abuse (HCC) [F19.10]   . Agitation [R45.1] 12/24/2016  . Aggression [R46.89] 12/24/2016  . Altered mental status [R41.82] 12/23/2016  . History of ED visit for Medical clearance for incarceration for alleged aggressive behavior and crack cocaine use [Z00.8] 09/05/2016  . Right knee pain [M25.561] 07/20/2016  . Essential hypertension [I10] 11/30/2015  . CAD -S/P PCI LAD/DES 10/13/15 [I25.10, Z98.61] 10/14/2015  . Wound infection- for I&D achilles wound 10/14/15 [T14.8XXA, L08.9] 10/14/2015  . Cocaine abuse-drug sceen positive, History of [F14.10] 10/14/2015  . Schizoaffective disorder, bipolar type (HCC) [F25.0] 10/14/2015  . Infection [B99.9] 10/14/2015  . NSTEMI (non-ST elevated myocardial infarction) (HCC) [I21.4]   . Chest pain [R07.9] 10/12/2015  . Achilles tendon rupture-surgery 09/19/15 [S86.019A] 09/19/2015   Total Time spent with patient: 30 minutes  Past Psychiatric History:  schizophrenia  Past Medical History:  Past Medical History:  Diagnosis Date  . Achilles tendon rupture-surgery 09/19/15 09/19/2015  . Altered mental status 12/23/2016  . Arthritis    "hips, knees, back" (10/13/2015)  . Bipolar disorder (HCC)   . CAD -S/P PCI LAD/DES 10/13/15 10/14/2015  . CHF (congestive heart failure) (HCC)   . Cocaine abuse-drug sceen positive, History of 10/14/2015  . Coronary artery disease   . Depression   . DVT (deep venous thrombosis) (HCC) 09/2015   right  . Essential hypertension 11/30/2015  . Heart murmur   . High cholesterol   . Hypertension   . Myocardial infarction (HCC) 10/12/2015 X 2-3  . NSTEMI (non-ST elevated myocardial infarction) (HCC)   . Rectal myiasis   . Schizoaffective disorder, bipolar type (HCC) 10/14/2015  . Schizoaffective disorder, bipolar type (HCC)   . Sciatica     Past Surgical History:  Procedure Laterality Date  . CARDIAC CATHETERIZATION N/A 10/13/2015   Procedure: Left Heart Cath and Coronary Angiography;  Surgeon: Corky Crafts, MD;  Location: [facility] INVASIVE CV LAB;  Service: Cardiovascular;  Laterality: N/A;  . CARDIAC CATHETERIZATION N/A 10/13/2015   Procedure: Coronary Stent Intervention;  Surgeon: Corky Crafts, MD;  Location: [facility] INVASIVE CV LAB;  Service: Cardiovascular;  Laterality: N/A;  . CARDIAC CATHETERIZATION N/A 10/13/2015   Procedure: Intravascular Ultrasound/IVUS;  Surgeon: Corky Crafts, MD;  Location: [facility] INVASIVE CV LAB;  Service: Cardiovascular;  Laterality: N/A;  . CORONARY ANGIOPLASTY WITH STENT PLACEMENT  10/13/2015  . I&D EXTREMITY Right 09/19/2015   Procedure: IRRIGATION AND DEBRIDEMENT ANKLE LACERATIONS POSSIBLE TENDON REPAIR;  Surgeon: Cammy Copa, MD;  Location: [facility] OR;  Service: Orthopedics;  Laterality: Right;  . I&D EXTREMITY Right 10/14/2015   Procedure: IRRIGATION AND DEBRIDEMENT EXTREMITY/Right Ankle;  Surgeon: Cammy CopaScott Gregory Dean, MD;  Location: [facility] OR;  Service: Orthopedics;   Laterality: Right;   Family History: History reviewed. No pertinent family history. Family Psychiatric  History: none reported Social History:  Social History   Substance and Sexual Activity  Alcohol Use Yes     Social History   Substance and Sexual Activity  Drug Use Yes  . Types: Cocaine, Marijuana   Comment: 10/13/2015 "I've tried alot; I don't have any habits"    Social History   Socioeconomic History  . Marital status: Single    Spouse name: None  . Number of children: None  . Years of education: None  . Highest education level: None  Social Needs  . Financial resource strain: None  . Food insecurity - worry: None  . Food insecurity - inability: None  . Transportation needs - medical: None  . Transportation needs - non-medical: None  Occupational History  . None  Tobacco Use  . Smoking status: Never Smoker  . Smokeless tobacco: Never Used  Substance and Sexual Activity  . Alcohol use: Yes  . Drug use: Yes    Types: Cocaine, Marijuana    Comment: 10/13/2015 "I've tried alot; I don't have any habits"  . Sexual activity: None  Other Topics Concern  . None  Social History Narrative  . None   Additional Social History:                         Sleep: Poor  Appetite:  Fair  Current Medications: Current Facility-Administered Medications  Medication Dose Route Frequency Provider Last Rate Last Dose  . acetaminophen (TYLENOL) tablet 650 mg  650 mg Oral Q6H PRN Pucilowska, Jolanta B, MD   650 mg at 01/01/17 1228  . albuterol (PROVENTIL) (2.5 MG/3ML) 0.083% nebulizer solution 2.5 mg  2.5 mg Inhalation Q6H PRN Pucilowska, Jolanta B, MD      . alum & mag hydroxide-simeth (MAALOX/MYLANTA) 200-200-20 MG/5ML suspension 30 mL  30 mL Oral Q4H PRN Pucilowska, Jolanta B, MD      . ARIPiprazole (ABILIFY) tablet 10 mg  10 mg Oral Daily Pucilowska, Jolanta B, MD   10 mg at 12/29/16 1633  . ARIPiprazole ER SRER 400 mg  400 mg Intramuscular Q28 days Pucilowska, Jolanta  B, MD   400 mg at 12/29/16 1634  . aspirin chewable tablet 81 mg  81 mg Oral Daily Pucilowska, Jolanta B, MD   81 mg at 12/31/16 0826  . atorvastatin (LIPITOR) tablet 20 mg  20 mg Oral q1800 Pucilowska, Jolanta B, MD   20 mg at 12/29/16 1635  . carbamazepine (TEGRETOL) tablet 200 mg  200 mg Oral BID AC & HS Pucilowska, Jolanta B, MD   Stopped at 12/29/16 2122  . chlorproMAZINE (THORAZINE) tablet 50 mg  50 mg Oral QID PRN Pucilowska, Jolanta B, MD      . diclofenac sodium (VOLTAREN) 1 % transdermal gel 2 g  2 g Topical QID PRN Cindee LameIsbell, Kevante Lunt M, MD   2 g at 12/31/16 1706  . gabapentin (NEURONTIN) capsule 600 mg  600 mg Oral TID Pucilowska, Jolanta B, MD   600 mg at 12/31/16 0826  . magnesium hydroxide (MILK OF MAGNESIA) suspension 30 mL  30 mL Oral Daily PRN Pucilowska, Jolanta B, MD      . multivitamin with minerals tablet 1 tablet  1  tablet Oral Daily Pucilowska, Jolanta B, MD   1 tablet at 01/01/17 0817  . nicotine (NICODERM CQ - dosed in mg/24 hours) patch 21 mg  21 mg Transdermal Q0600 Pucilowska, Jolanta B, MD      . nitroGLYCERIN (NITROSTAT) SL tablet 0.4 mg  0.4 mg Sublingual Q5 min PRN Pucilowska, Jolanta B, MD      . terbinafine (LAMISIL) 1 % cream   Topical BID Clapacs, John T, MD      . traZODone (DESYREL) tablet 100 mg  100 mg Oral QHS Pucilowska, Jolanta B, MD   Stopped at 12/29/16 2123    Lab Results: No results found for this or any previous visit (from the past 48 hour(s)).  Blood Alcohol level:  Lab Results  Component Value Date   ETH <10 12/23/2016   ETH <5 09/19/2015    Metabolic Disorder Labs: Lab Results  Component Value Date   HGBA1C 5.6 10/12/2015   MPG 114 10/12/2015   No results found for: PROLACTIN Lab Results  Component Value Date   CHOL 165 12/28/2016   TRIG 58 12/28/2016   HDL 56 12/28/2016   CHOLHDL 2.9 12/28/2016   VLDL 12 12/28/2016   LDLCALC 97 12/28/2016   LDLCALC 96 10/13/2015    Physical Findings: AIMS: Facial and Oral Movements Muscles  of Facial Expression: None, normal Lips and Perioral Area: None, normal Jaw: None, normal Tongue: None, normal,Extremity Movements Upper (arms, wrists, hands, fingers): None, normal Lower (legs, knees, ankles, toes): None, normal, Trunk Movements Neck, shoulders, hips: None, normal, Overall Severity Severity of abnormal movements (highest score from questions above): None, normal Incapacitation due to abnormal movements: None, normal Patient's awareness of abnormal movements (rate only patient's report): No Awareness, Dental Status Current problems with teeth and/or dentures?: No Does patient usually wear dentures?: No  CIWA:  CIWA-Ar Total: 0 COWS:  COWS Total Score: 1  Musculoskeletal: Strength & Muscle Tone: within normal limits Gait & Station: normal Patient leans: N/A  Psychiatric Specialty Exam: Physical Exam  Nursing note and vitals reviewed. Psychiatric: His speech is normal. His affect is blunt. He is actively hallucinating. Thought content is paranoid and delusional. Cognition and memory are normal. He expresses impulsivity.    Review of Systems  Neurological: Negative.   Psychiatric/Behavioral: Positive for hallucinations and substance abuse.  All other systems reviewed and are negative.   Blood pressure 138/76, pulse 87, temperature 98.9 F (37.2 C), temperature source Oral, resp. rate 18, height 6\' 7"  (2.007 m), weight 95.3 kg (210 lb), SpO2 98 %.Body mass index is 23.66 kg/m.  General Appearance: Casual  Eye Contact:  Good  Speech:  Clear and Coherent  Volume:  Normal  Mood:  Anxious  Affect:  Appropriate  Thought Process:  Goal Directed and Descriptions of Associations: Intact  Orientation:  Full (Time, Place, and Person)  Thought Content:  Delusions, Hallucinations: Auditory and Paranoid Ideation  Suicidal Thoughts:  No  Homicidal Thoughts:  No  Memory:  Immediate;   Fair Recent;   Fair Remote;   Fair  Judgement:  Poor  Insight:  Lacking  Psychomotor  Activity:  Normal  Concentration:  Concentration: Fair and Attention Span: Fair  Recall:  [employer]  Fund of Knowledge:  Fair  Language:  Fair  Akathisia:  No  Handed:  Right  AIMS (if indicated):     Assets:  Communication Skills Desire for Improvement Physical Health Resilience Social Support  ADL's:  Intact  Cognition:  WNL  Sleep:  Number of Hours:  8     Treatment Plan Summary: Daily contact with patient to assess and evaluate symptoms and progress in treatment and Medication management   Mr. Trulson is a 46 year old male with a history of schizophrenia admitted for altered mental status in the context of medication noncompliance and substance abuse.  Refused po meds today but will reoffer  #Psychosis -continue Tegretol 200 mg BID -continue Abilify 10 mg daily -bilify maintena 400 mg every 28 days, first injection given11/30  #Insomnia -continue Trazodone  #Substance abuse -patient minimizes problems and declines treatment  #Back and leg pain -found voltaren 1% gel in his home meds and restarted, he voiced pain relief   #COPD -continue Albuterol  #CAD -continue ASA, Nitrostat, Lipitor  #Metabolic syndrome monitoring -Lipid panel, TSH and HgbA1C are normal -EKG, QTc 442  #Smoking -Nicotine patch is available  #Disposition -TBE    Cindee Lame, MD 01/01/2017, 3:28 PM

## 2017-01-01 NOTE — Progress Notes (Signed)
D- Patient alert and oriented. Patient presents in a pleasant mood with some slight agitation because he is ready to discharge. Patient states that "I can't do anything productive laying in bed and medicated all day". Patient states that he is "weak because of his Abilify shot". Patient complains of having a sore throat rating his pain level a "5/10" and "3/10" in his back that "intensifies when I lay down". Patient didn't request any pain medication from this writer at this time. Patient denies SI, HI, AVH, at this time.  A- Some of patient's scheduled medications administered per MD orders. Support and encouragement provided.  Routine safety checks conducted every 15 minutes.  Patient informed to notify staff with problems or concerns.  R- No adverse drug reactions noted. Patient contracts for safety at this time. Patient compliant with medications and treatment plan. Patient receptive, calm, and cooperative. Patient interacts well with others on the unit.  Patient remains safe at this time.

## 2017-01-01 NOTE — BHH Group Notes (Signed)
01/01/2017 1:15pm  Type of Therapy and Topic: Group Therapy: Feelings Around Returning Home & Establishing a Supportive Framework and Supporting Oneself When Supports Not Available  Participation Level: Active  Description of Group:  Patients first processed thoughts and feelings about upcoming discharge. These included fears of upcoming changes, lack of change, new living environments, judgements and expectations from others and overall stigma of mental health issues. The group then discussed the definition of a supportive framework, what that looks and feels like, and how do to discern it from an unhealthy non-supportive network. The group identified different types of supports as well as what to do when your family/friends are less than helpful or unavailable  Therapeutic Goals  1. Patient will identify one healthy supportive network that they can use at discharge. 2. Patient will identify one factor of a supportive framework and how to tell it from an unhealthy network. 3. Patient able to identify one coping skill to use when they do not have positive supports from others. 4. Patient will demonstrate ability to communicate their needs through discussion and/or role plays.  Summary of Patient Progress: Pt engaged actively during group session. As patients processed their anxiety about discharge and described healthy supports patient shared that he has to set priorities to stay healthy. He indicated that he has a support system of directors and pastors. Patients identified at least one self-care tool they were willing to use after discharge.   Therapeutic Modalities Cognitive Behavioral Therapy Motivational Interviewing   Medford Staheli  CUEBAS-COLON, LCSW 01/01/2017 12:09 PM

## 2017-01-02 MED ORDER — ARIPIPRAZOLE ER 400 MG IM SRER: 400.0000 mg | INTRAMUSCULAR | 1 refills | Status: DC

## 2017-01-02 NOTE — Plan of Care (Signed)
Pt. Able to verbalize understanding of general education this evening. Pt. Reports feeling, "better" this evening compared to yesterday. Pt. Reports eating, "good". Pt. Reports relief from generalized throat pain this evening. Pt. Able to remain safe while on the unit and verbally contracts for safety. Pt. Not progressing with educational however in regards to medication compliance.

## 2017-01-02 NOTE — Progress Notes (Signed)
D:Pt denies SI/HI/AVH. Pt is pleasant and cooperative upon interaction. Pt. Expressed vague throat soreness today that went away with warm fluids.   A: Q x 15 minute observation checks were completed for safety. Patient was provided with education. Patient was encourage to attend groups, participate in unit activities and continue with plan of care.   R:Pt. Able to remain safe while on the unit. Pt. Non-compliant with evening medications, but verbalizes education on teachings. Educational reinforcement needed for compliance with medications. Pt. Participates in unit activities and groups. Pt. Socializes with other patients in the milieu. Pt. Reports eating, "good" and sleeping, "good".Pt. Reports feeling better today then he did yesterday.              Patient slept for Estimated Hours of 6.15; Precautionary checks every 15 minutes for safety maintained, room free of safety hazards, patient sustains no injury or falls during this shift.

## 2017-01-02 NOTE — Discharge Summary (Signed)
Patient alert and oriented X4. Patient received discharged instructions,patient verbalized understanding without any concerns. Patient received prescription for medication. Medication information provided, patient verbalized understanding when and how to take medications. Patient denies SI, HI and AVH. Courtesy vehicle escorted patient to bus stop.
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
## 2017-01-02 NOTE — Progress Notes (Signed)
  [facility] :  Will you be returning to the same living situation after discharge:  No. Pt reports he will be staying with a friend.  Pt also reports that [employer] will give him a lobby pass if needed. At discharge, do you have transportation home?: No. Bus pass provided. Do you have the ability to pay for your medications: No. [facility].  Release of information consent forms completed and in the chart;  Patient's signature needed at discharge.  Patient to Follow up at: Follow-up Information    [facility]. Go on 01/19/2017.   Why:  Please attend your medication appointment on Thursday, 01/19/17 at 4:00pm.  Please bring a copy of your hospital discharge paperwork.  If you need services before then, walk in hours are Monday-Friday, 8am-3pm. Contact information: [address] [place] [address] [phone]        [facility] Go [facility]/2017.   Specialty:  Behavioral Health Why:  Please meet with Consuelo PandyMonique Reynolds to reinitiate veteran's housing services.  Gabriel RungMonique will be at the [facility], [address] [place], [place], on Mondays between 10am-12noon. Contact information: [address] [place] [id number] [phone]          Next level of care provider has access to [facility] Link:no  Safety Planning and Suicide Prevention discussed: Yes,  with friend     Has patient been referred to the Quitline?: Yes, faxed on 01/02/17  Patient has been referred for addiction treatment: Yes  Lorri FrederickWierda, Karey Stucki Jon, LCSW 01/02/2017, 11:37 AM

## 2017-01-02 NOTE — B[facility] Suicide Risk Assessment (Signed)
[facility]   Principal Problem: Schizoaffective disorder, bipolar type [facility](HCC) Discharge Diagnoses:  Patient Active Problem List   Diagnosis Date Noted  . Tobacco use disorder [F17.200] 12/29/2016  . COPD (chronic obstructive pulmonary disease) (HCC) [J44.9] 12/28/2016  . Dyslipidemia [E78.5] 12/28/2016  . 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 Drug abuse (HCC) [F19.10]   . Agitation [R45.1] 12/24/2016  . Aggression [R46.89] 12/24/2016  . Altered mental status [R41.82] 12/23/2016  . History of ED visit for Medical clearance for incarceration for alleged aggressive behavior and crack cocaine use [Z00.8] 09/05/2016  . Right knee pain [M25.561] 07/20/2016  . Essential hypertension [I10] 11/30/2015  . CAD -S/P PCI LAD/DES 10/13/15 [I25.10, Z98.61] 10/14/2015  . Wound infection- for I&D achilles wound 10/14/15 [T14.8XXA, L08.9] 10/14/2015  . Cocaine abuse-drug sceen positive, History of [F14.10] 10/14/2015  . Schizoaffective disorder, bipolar type (HCC) [F25.0] 10/14/2015  . Infection [B99.9] 10/14/2015  . NSTEMI (non-ST elevated myocardial infarction) (HCC) [I21.4]   . Chest pain [R07.9] 10/12/2015  . Achilles tendon rupture-surgery 09/19/15 [S86.019A] 09/19/2015    Total Time spent with patient: 30 minutes  Musculoskeletal: Strength & Muscle Tone: within normal limits Gait & Station: normal Patient leans: N/A  Psychiatric Specialty Exam: Review of Systems  Neurological: Negative.   Psychiatric/Behavioral: Negative.   All other systems reviewed and are negative.   Blood pressure 137/89, pulse 84, temperature 98.1 F (36.7 C), temperature source Oral, resp. rate 18, height 6\' 7"  (2.007 m), weight 95.3 kg (210 lb), SpO2 98 %.Body mass index is 23.66 kg/m.  General Appearance: Casual  Eye Contact::  Good  Speech:  Clear and Coherent409  Volume:  Normal  Mood:  Euthymic  Affect:  Appropriate  Thought Process:  Goal Directed and  Descriptions of Associations: Intact  Orientation:  Full (Time, Place, and Person)  Thought Content:  WDL  Suicidal Thoughts:  No  Homicidal Thoughts:  No  Memory:  Immediate;   Fair Recent;   Fair Remote;   Fair  Judgement:  Poor  Insight:  Shallow  Psychomotor Activity:  Normal  Concentration:  Fair  Recall:  [employer]  Fund of Knowledge:Fair  Language: Fair  Akathisia:  No  Handed:  Right  AIMS (if indicated):     Assets:  Communication Skills Desire for Improvement Housing Physical Health Resilience Social Support  Sleep:  Number of Hours: 8  Cognition: WNL  ADL's:  Intact   Mental Status Per Nursing Assessment::   On Admission:  NA(altered mental status)  Demographic Factors:  Male, Low socioeconomic status and Unemployed  Loss Factors: Financial problems/change in socioeconomic status  Historical Factors: Family history of mental illness or substance abuse and Impulsivity  Risk Reduction Factors:   Responsible for children under 47 years of age, Sense of responsibility to family, Living with another person, especially a relative and Positive social support  Continued Clinical Symptoms:  Alcohol/Substance Abuse/Dependencies Schizophrenia:   Depressive state  Cognitive Features That Contribute To Risk:  None    Suicide Risk:  Minimal: No identifiable suicidal ideation.  Patients presenting with no risk factors but with morbid ruminations; may be classified as minimal risk based on the severity of the depressive symptoms  Follow-up Information    [facility]. Go to.   Contact information: [address] [place] [address] [phone]           Plan Of Care/Follow-up recommendations:  Activity:  as tolerated Diet:  low sodium heart healthy Other:  keep  follow up appointments  Kristine LineaJolanta Pucilowska, MD 01/02/2017, 11:22 AM

## 2017-01-02 NOTE — Plan of Care (Signed)
Patient is alert and oriented X 4. Patient denies SI, HI and AVH. Patient refused morning medications except for multivitamin. Patient attends groups and is appropriate with staff and his peers on the unit. Patient complains of a sore throat. Safety checks Q 15 minutes will continue.

## 2017-01-02 NOTE — Progress Notes (Signed)
Recreation Therapy Notes  INPATIENT RECREATION TR PLAN  Patient Details Name: Brett Salinas MRN: [id number] DOB: [birth year] Today's Date: 01/02/2017  Rec Therapy Plan Is patient appropriate for Therapeutic Recreation?: Yes Treatment times per week: At least 3 Estimated Length of Stay: 5-7 days TR Treatment/Interventions: Group participation (Comment)(Appropriate participation in recreation therapy tx. )  Discharge Criteria Pt will be discharged from therapy if:: Discharged Treatment plan/goals/alternatives discussed and agreed upon by:: Patient/family  Discharge Summary Short term goals set: Patient will identify 3 healthy leisure activities that can be utilized post discharge x 5 days.  Short term goals met: Complete Reason goals not met: N/A Therapeutic equipment acquired: N/A Reason patient discharged from therapy: Discharge from hospital Pt/family agrees with progress & goals achieved: Yes Date patient discharged from therapy: 01/02/17   Devanshi Califf 01/02/2017, 12:52 PM

## 2017-01-02 NOTE — Discharge Summary (Signed)
Physician Discharge Summary Note  Patient:  Brett Salinas is an 46 y.o., male MRN:  [id number] DOB:  [birth year] Patient phone:  [phone] (home)  Patient address:   [place] [address],  Total Time spent with patient: 30 minutes  Date of Admission:  12/28/2016 Date of Discharge: 01/02/2017  Reason for Admission:  Psychotic break  History of Present Illness:   Identifying data. Brett Salinas is a 46 year old male with a history of schizoaffective disorder.  Chief complaint. "I lost my mind."  History of present illness. Information was obtained  From the patient and the chart. The patient was brought to [facility] ER utterly confused, apparently walking around for two days. He was positive for amphetamines and initially was thought to be in withdrawals. He was briefly admitted to medicine for altered mental status. He does have a history of schizophrenia and has not been compliant with medications of Zyprexa and Tegretol as they make his sleepy. In the ER, he was clearly psychotic, paranoid, delusional, hallucinating and agitated. He felt the government is after him to eliminate people with mental illness. He was unable to answer many questions. He has a history of substance use including cocaine.  Today, he is rather frustrated with me for asking the same questions. He does not remember circumstances of his admission but admits that he lost his mind, Liley in a manic episode. Not the first one. He denies suicidal or homicidal ideation but still paranoid and hallucinating.   Past psychiatric history. Long history of mental illness and substance abuse with multiple hospitalizations and medication trials. He could mane all antipsychotics and mood stabilizers. Does not like medicines that make him sleepy, especially Zyprexa. He is good to take Abilify including Maintena. He has never been on injectable. He attempted suicide multiple times, last time over 10 year ago by  overdose.  Family psychiatric history. Unknown.  Social history. He is an Investment banker, operational. I don't know if he is service connected. He has been homeless since July.  Principal Problem: Schizoaffective disorder, bipolar type [facility]) Discharge Diagnoses: Patient Active Problem List   Diagnosis Date Noted  . Tobacco use disorder [F17.200] 12/29/2016  . COPD (chronic obstructive pulmonary disease) (HCC) [J44.9] 12/28/2016  . Dyslipidemia [E78.5] 12/28/2016  . 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 Drug abuse (HCC) [F19.10]   . Agitation [R45.1] 12/24/2016  . Aggression [R46.89] 12/24/2016  . Altered mental status [R41.82] 12/23/2016  . History of ED visit for Medical clearance for incarceration for alleged aggressive behavior and crack cocaine use [Z00.8] 09/05/2016  . Right knee pain [M25.561] 07/20/2016  . Essential hypertension [I10] 11/30/2015  . CAD -S/P PCI LAD/DES 10/13/15 [I25.10, Z98.61] 10/14/2015  . Wound infection- for I&D achilles wound 10/14/15 [T14.8XXA, L08.9] 10/14/2015  . Cocaine abuse-drug sceen positive, History of [F14.10] 10/14/2015  . Schizoaffective disorder, bipolar type (HCC) [F25.0] 10/14/2015  . Infection [B99.9] 10/14/2015  . NSTEMI (non-ST elevated myocardial infarction) (HCC) [I21.4]   . Chest pain [R07.9] 10/12/2015  . Achilles tendon rupture-surgery 09/19/15 [S86.019A] 09/19/2015   Past Medical History:  Past Medical History:  Diagnosis Date  . Achilles tendon rupture-surgery 09/19/15 09/19/2015  . Altered mental status 12/23/2016  . Arthritis    "hips, knees, back" (10/13/2015)  . Bipolar disorder (HCC)   . CAD -S/P PCI LAD/DES 10/13/15 10/14/2015  . CHF (congestive heart failure) (HCC)   . Cocaine abuse-drug sceen positive, History of 10/14/2015  . Coronary artery disease   . Depression   .  DVT (deep venous thrombosis) (HCC) 09/2015   right  . Essential hypertension 11/30/2015  . Heart murmur   . High cholesterol   .  Hypertension   . Myocardial infarction (HCC) 10/12/2015 X 2-3  . NSTEMI (non-ST elevated myocardial infarction) (HCC)   . Rectal myiasis   . Schizoaffective disorder, bipolar type (HCC) 10/14/2015  . Schizoaffective disorder, bipolar type (HCC)   . Sciatica     Past Surgical History:  Procedure Laterality Date  . CARDIAC CATHETERIZATION N/A 10/13/2015   Procedure: Left Heart Cath and Coronary Angiography;  Surgeon: Corky CraftsJayadeep S Varanasi, MD;  [facility];  Service: Cardiovascular;  Laterality: N/A;  . CARDIAC CATHETERIZATION N/A 10/13/2015   Procedure: Coronary Stent Intervention;  Surgeon: Corky CraftsJayadeep S Varanasi, MD;  [facility];  Service: Cardiovascular;  Laterality: N/A;  . CARDIAC CATHETERIZATION N/A 10/13/2015   Procedure: Intravascular Ultrasound/IVUS;  Surgeon: Corky CraftsJayadeep S Varanasi, MD;  [facility];  Service: Cardiovascular;  Laterality: N/A;  . CORONARY ANGIOPLASTY WITH STENT PLACEMENT  10/13/2015  . I&D EXTREMITY Right 09/19/2015   Procedure: IRRIGATION AND DEBRIDEMENT ANKLE LACERATIONS POSSIBLE TENDON REPAIR;  Surgeon: Cammy CopaScott Gregory Dean, MD;  Location: [facility] OR;  Service: Orthopedics;  Laterality: Right;  . I&D EXTREMITY Right 10/14/2015   Procedure: IRRIGATION AND DEBRIDEMENT EXTREMITY/Right Ankle;  Surgeon: Cammy CopaScott Gregory Dean, MD;  Location: [facility] OR;  Service: Orthopedics;  Laterality: Right;   Family History: History reviewed. No pertinent family history.  Social History:  Social History   Substance and Sexual Activity  Alcohol Use Yes     Social History   Substance and Sexual Activity  Drug Use Yes  . Types: Cocaine, Marijuana   Comment: 10/13/2015 "I've tried alot; I don't have any habits"    Social History   Socioeconomic History  . Marital status: Single    Spouse name: None  . Number of children: None  . Years of education: None  . Highest education level: None  Social Needs  . Financial resource strain: None  . Food  insecurity - worry: None  . Food insecurity - inability: None  . Transportation needs - medical: None  . Transportation needs - non-medical: None  Occupational History  . None  Tobacco Use  . Smoking status: Never Smoker  . Smokeless tobacco: Never Used  Substance and Sexual Activity  . Alcohol use: Yes  . Drug use: Yes    Types: Cocaine, Marijuana    Comment: 10/13/2015 "I've tried alot; I don't have any habits"  . Sexual activity: None  Other Topics Concern  . None  Social History Narrative  . None   Hospital Course:    Brett Salinas is a 46 year old male with a history of schizophrenia admitted for altered mental status in the context of medication noncompliance and substance abuse. The patient agreed to Abilify maintane injection but consistently refused all oral psychotropics complaining that they "slow him down".   #Psychosis -Abilify maintena 400 mg every 28 days, next injection on 01/26/2017 -patient was offered oral Abilify and Tegretol but refused  #Substance abuse -patient minimizes problems and declines treatment  #Back and leg pain -continue voltaren 1% gel  #COPD -continue Albuterol  #CAD -continue ASA, Nitrostat, Lipitor  #Metabolic syndrome monitoring -Lipid panel, TSH and HgbA1C are normal -EKG, QTc 442  #Smoking -Nicotine patch was available  #Disposition -discharge to his baby-mama's place -[facility]  Physical Findings: AIMS: Facial and Oral Movements Muscles of Facial Expression: None,  normal Lips and Perioral Area: None, normal Jaw: None, normal Tongue: None, normal,Extremity Movements Upper (arms, wrists, hands, fingers): None, normal Lower (legs, knees, ankles, toes): None, normal, Trunk Movements Neck, shoulders, hips: None, normal, Overall Severity Severity of abnormal movements (highest score from questions above): None, normal Incapacitation due to abnormal movements: None, normal Patient's awareness of abnormal  movements (rate only patient's report): No Awareness, Dental Status Current problems with teeth and/or dentures?: No Does patient usually wear dentures?: No  CIWA:  CIWA-Ar Total: 0 COWS:  COWS Total Score: 1  Musculoskeletal: Strength & Muscle Tone: within normal limits Gait & Station: normal Patient leans: N/A  Psychiatric Specialty Exam: Physical Exam  Nursing note and vitals reviewed. Psychiatric: He has a normal mood and affect. His speech is normal and behavior is normal. Thought content normal. Cognition and memory are normal. He expresses impulsivity.    Review of Systems  Neurological: Negative.   Psychiatric/Behavioral: Positive for substance abuse.  All other systems reviewed and are negative.   Blood pressure 137/89, pulse 84, temperature 98.1 F (36.7 C), temperature source Oral, resp. rate 18, height 6\' 7"  (2.007 m), weight 95.3 kg (210 lb), SpO2 98 %.Body mass index is 23.66 kg/m.  General Appearance: Casual  Eye Contact:  Good  Speech:  Clear and Coherent  Volume:  Normal  Mood:  Euthymic  Affect:  Appropriate  Thought Process:  Goal Directed and Descriptions of Associations: Intact  Orientation:  Full (Time, Place, and Person)  Thought Content:  WDL  Suicidal Thoughts:  No  Homicidal Thoughts:  No  Memory:  Immediate;   Fair Recent;   Fair Remote;   Fair  Judgement:  Poor  Insight:  Lacking  Psychomotor Activity:  Normal  Concentration:  Concentration: Fair and Attention Span: Fair  Recall:  FiservFair  Fund of Knowledge:  Fair  Language:  Fair  Akathisia:  No  Handed:  Right  AIMS (if indicated):     Assets:  Communication Skills Desire for Improvement Housing Physical Health Resilience Social Support  ADL's:  Intact  Cognition:  WNL  Sleep:  Number of Hours: 8        Has this patient used any form of tobacco in the last 30 days? (Cigarettes, Smokeless Tobacco, Cigars, and/or Pipes) Yes, Yes, A prescription for an FDA-approved tobacco cessation  medication was offered at discharge and the patient refused  Blood Alcohol level:  Lab Results  Component Value Date   [facility] 12/23/2016   ETH <5 09/19/2015    Metabolic Disorder Labs:  Lab Results  Component Value Date   HGBA1C 5.6 10/12/2015   MPG 114 10/12/2015   No results found for: PROLACTIN Lab Results  Component Value Date   CHOL 165 12/28/2016   TRIG 58 12/28/2016   HDL 56 12/28/2016   CHOLHDL 2.9 12/28/2016   VLDL 12 12/28/2016   LDLCALC 97 12/28/2016   LDLCALC 96 10/13/2015    See Psychiatric Specialty Exam and Suicide Risk Assessment completed by Attending Physician prior to discharge.  Discharge destination:  Home  Is patient on multiple antipsychotic therapies at discharge:  No   Has Patient had three or more failed trials of antipsychotic monotherapy by history:  No  Recommended Plan for Multiple Antipsychotic Therapies: NA  Discharge Instructions    Diet - low sodium heart healthy   Complete by:  As directed    Increase activity slowly   Complete by:  As directed      Allergies as  of 01/02/2017      Reactions   Catfish [fish Allergy] Anaphylaxis   Heparin Itching   Full-body itching inc throat   Pork-derived Products Itching   Burning/itching      Medication List    STOP taking these medications   loratadine 10 MG tablet Commonly known as:  CLARITIN   ZYPREXA 10 MG tablet Generic drug:  OLANZapine     TAKE these medications     Indication  albuterol 108 (90 Base) MCG/ACT inhaler Commonly known as:  VENTOLIN HFA Inhale 2 puffs into the lungs every 6 (six) hours as needed for wheezing or shortness of breath.  Indication:  Asthma   ARIPiprazole ER 400 MG Srer Inject 400 mg into the muscle every 28 (twenty-eight) days. Next dose on 01/26/2017 Start taking on:  01/26/2017  Indication:  Mixed Bipolar Affective Disorder   aspirin EC 81 MG tablet Take 1 tablet (81 mg total) by mouth daily.  Indication:  Inflammation   atorvastatin  20 MG tablet Commonly known as:  LIPITOR Take 1 tablet (20 mg total) by mouth daily.  Indication:  High Amount of Fats in the Blood   carbamazepine 200 MG tablet Commonly known as:  TEGRETOL Take 200 mg by mouth at bedtime.  Indication:  Manic-Depression   fluticasone 50 MCG/ACT nasal spray Commonly known as:  FLONASE Place 2 sprays into both nostrils daily.  Indication:  Signs and Symptoms of Nose Diseases   gabapentin 600 MG tablet Commonly known as:  NEURONTIN Take 1 tablet (600 mg total) by mouth 3 (three) times daily.  Indication:  Neuropathic Pain   multivitamin with minerals Tabs tablet Take 1 tablet by mouth daily.  Indication:  general health   nitroGLYCERIN 0.4 MG SL tablet Commonly known as:  NITROSTAT Place 1 tablet (0.4 mg total) under the tongue every 5 (five) minutes x 3 doses as needed for chest pain.  Indication:  Acute Angina Pectoris   terbinafine 1 % cream Commonly known as:  LAMISIL Apply topically daily.  Indication:  Athlete's Foot      Follow-up Information    [facility]. Go on 01/19/2017.   Why:  Please attend your medication appointment on Thursday, 01/19/17 at 4:00pm.  Please bring a copy of your hospital discharge paperwork.  If you need services before then, walk in hours are Monday-Friday, 8am-3pm. Contact information: [address] [place] [address] [phone]        [facility]. Go on 01/09/2017.   Specialty:  Behavioral Health Why:  Please meet with Consuelo Pandy to reinitiate veteran's housing services.  Gabriel Rung will be at the [facility], [address], [place], on Mondays between 10am-12noon. Contact information: [address] [place] [address] [phone]          Follow-up recommendations:  Activity:  as tolerated Diet:  low sodium heart healthy Other:  keep follow up appointments  Comments:    Signed: Kristine Linea, MD 01/02/2017, 11:44 AM

## 2017-01-06 ENCOUNTER — Other Ambulatory Visit: Payer: Self-pay | Admitting: Cardiology

## 2017-01-06 ENCOUNTER — Other Ambulatory Visit: Payer: Self-pay | Admitting: Family Medicine

## 2017-01-06 MED ORDER — ADULT MULTIVITAMIN W/MINERALS CH: 1.0000 | ORAL_TABLET | Freq: Every day | ORAL | 0 refills | Status: DC

## 2017-01-06 MED ORDER — TERBINAFINE HCL 1 % EX CREA: TOPICAL_CREAM | Freq: Every day | CUTANEOUS | 0 refills | Status: DC

## 2017-01-11 ENCOUNTER — Other Ambulatory Visit: Payer: Self-pay | Admitting: Physical Medicine & Rehabilitation

## 2017-01-11 ENCOUNTER — Other Ambulatory Visit: Payer: Self-pay

## 2017-01-11 ENCOUNTER — Encounter: Payer: Self-pay | Admitting: Physical Medicine & Rehabilitation

## 2017-01-11 ENCOUNTER — Encounter: Payer: Self-pay | Attending: Physical Medicine & Rehabilitation | Admitting: Physical Medicine & Rehabilitation

## 2017-01-11 VITALS — BP 140/96 | HR 78 | Resp 14

## 2017-01-11 DIAGNOSIS — E7800 Pure hypercholesterolemia, unspecified: Secondary | ICD-10-CM | POA: Insufficient documentation

## 2017-01-11 DIAGNOSIS — Z9889 Other specified postprocedural states: Secondary | ICD-10-CM | POA: Insufficient documentation

## 2017-01-11 DIAGNOSIS — I252 Old myocardial infarction: Secondary | ICD-10-CM | POA: Insufficient documentation

## 2017-01-11 DIAGNOSIS — Z590 Homelessness: Secondary | ICD-10-CM | POA: Insufficient documentation

## 2017-01-11 DIAGNOSIS — I110 Hypertensive heart disease with heart failure: Secondary | ICD-10-CM | POA: Insufficient documentation

## 2017-01-11 DIAGNOSIS — F209 Schizophrenia, unspecified: Secondary | ICD-10-CM | POA: Insufficient documentation

## 2017-01-11 DIAGNOSIS — I509 Heart failure, unspecified: Secondary | ICD-10-CM | POA: Insufficient documentation

## 2017-01-11 DIAGNOSIS — M1990 Unspecified osteoarthritis, unspecified site: Secondary | ICD-10-CM | POA: Insufficient documentation

## 2017-01-11 DIAGNOSIS — F319 Bipolar disorder, unspecified: Secondary | ICD-10-CM | POA: Insufficient documentation

## 2017-01-11 DIAGNOSIS — M1711 Unilateral primary osteoarthritis, right knee: Secondary | ICD-10-CM

## 2017-01-11 DIAGNOSIS — I2510 Atherosclerotic heart disease of native coronary artery without angina pectoris: Secondary | ICD-10-CM | POA: Insufficient documentation

## 2017-01-11 DIAGNOSIS — M25571 Pain in right ankle and joints of right foot: Secondary | ICD-10-CM | POA: Insufficient documentation

## 2017-01-11 DIAGNOSIS — F1410 Cocaine abuse, uncomplicated: Secondary | ICD-10-CM | POA: Insufficient documentation

## 2017-01-11 DIAGNOSIS — M1712 Unilateral primary osteoarthritis, left knee: Secondary | ICD-10-CM

## 2017-01-11 DIAGNOSIS — R531 Weakness: Secondary | ICD-10-CM | POA: Insufficient documentation

## 2017-01-11 MED ORDER — ADULT MULTIVITAMIN W/MINERALS CH: 1.0000 | ORAL_TABLET | Freq: Every day | ORAL | 0 refills | Status: DC

## 2017-01-11 MED ORDER — ATORVASTATIN CALCIUM 20 MG PO TABS: 20.0000 mg | ORAL_TABLET | Freq: Every day | ORAL | 1 refills | Status: DC

## 2017-01-11 MED ORDER — ATORVASTATIN CALCIUM 20 MG PO TABS: 20.0000 mg | ORAL_TABLET | Freq: Every day | ORAL | 2 refills | Status: DC

## 2017-01-11 MED FILL — $VENTOLIN HFA 18G INHALER: 108 (90 BAS | 25 days supply | Qty: 18 | Fill #1

## 2017-01-11 MED FILL — ?ATORVASTATIN 20 MG TABLET: 20 | 30 days supply | Qty: 30 | Fill #0

## 2017-01-11 MED FILL — METOPROLOL SUCC ER 25 MG TA: 25 | 30 days supply | Qty: 15 | Fill #4

## 2017-01-11 MED FILL — FLUTICASONE PROP 50 MCG SPR: 50 | 30 days supply | Qty: 16 | Fill #6

## 2017-01-11 MED FILL — GABAPENTIN 600 MG TABLET: 600 | 30 days supply | Qty: 90 | Fill #0

## 2017-01-11 MED FILL — ?CLOPIDOGREL 75MG TAB: 75 | 30 days supply | Qty: 30 | Fill #6

## 2017-01-11 NOTE — Progress Notes (Signed)
Knee injection: Bilateral  Indication: Knee pain not relieved by medication management and other conservative care.  Informed consent was obtained after describing risks and benefits of the procedure with the patient, this includes bleeding, bruising, infection and medication side effects. The patient wishes to proceed and has given written consent. Patient was placed in a seated position. Bilateral knees were marked and prepped with betadine in the anteromedial area. Vapocoolant spray was applied. A 25-gauge 1.5 inch needle was inserted into the joint spaces. After negative draw back for blood, a solution containing 1 mL of 6 mg per ML celestone and 4 mL of 1% lidocaine was injected (x2). A band aid was applied. The patient tolerated the procedure well. Post procedure instructions were given.

## 2017-04-07 ENCOUNTER — Telehealth: Payer: Self-pay | Admitting: Physical Medicine & Rehabilitation

## 2017-04-07 ENCOUNTER — Encounter: Payer: No Typology Code available for payment source | Admitting: Physical Medicine & Rehabilitation

## 2017-04-07 NOTE — Telephone Encounter (Signed)
Pt phoned stating he would like another knee injection. He last had a bilateral knee injection in 12/2016. Please advise.

## 2017-04-07 NOTE — Telephone Encounter (Signed)
Next appointment made for 04-12-2017

## 2017-04-07 NOTE — Telephone Encounter (Signed)
He may have one after 04/11/17.  Thanks.

## 2017-04-12 ENCOUNTER — Encounter: Payer: Self-pay | Attending: Physical Medicine & Rehabilitation | Admitting: Physical Medicine & Rehabilitation

## 2017-04-12 DIAGNOSIS — I509 Heart failure, unspecified: Secondary | ICD-10-CM | POA: Insufficient documentation

## 2017-04-12 DIAGNOSIS — R531 Weakness: Secondary | ICD-10-CM | POA: Insufficient documentation

## 2017-04-12 DIAGNOSIS — F209 Schizophrenia, unspecified: Secondary | ICD-10-CM | POA: Insufficient documentation

## 2017-04-12 DIAGNOSIS — Z9889 Other specified postprocedural states: Secondary | ICD-10-CM | POA: Insufficient documentation

## 2017-04-12 DIAGNOSIS — Z590 Homelessness: Secondary | ICD-10-CM | POA: Insufficient documentation

## 2017-04-12 DIAGNOSIS — I2510 Atherosclerotic heart disease of native coronary artery without angina pectoris: Secondary | ICD-10-CM | POA: Insufficient documentation

## 2017-04-12 DIAGNOSIS — E7800 Pure hypercholesterolemia, unspecified: Secondary | ICD-10-CM | POA: Insufficient documentation

## 2017-04-12 DIAGNOSIS — F319 Bipolar disorder, unspecified: Secondary | ICD-10-CM | POA: Insufficient documentation

## 2017-04-12 DIAGNOSIS — M1990 Unspecified osteoarthritis, unspecified site: Secondary | ICD-10-CM | POA: Insufficient documentation

## 2017-04-12 DIAGNOSIS — M25571 Pain in right ankle and joints of right foot: Secondary | ICD-10-CM | POA: Insufficient documentation

## 2017-04-12 DIAGNOSIS — F1410 Cocaine abuse, uncomplicated: Secondary | ICD-10-CM | POA: Insufficient documentation

## 2017-04-12 DIAGNOSIS — I252 Old myocardial infarction: Secondary | ICD-10-CM | POA: Insufficient documentation

## 2017-04-12 DIAGNOSIS — I110 Hypertensive heart disease with heart failure: Secondary | ICD-10-CM | POA: Insufficient documentation

## 2017-05-03 ENCOUNTER — Encounter (HOSPITAL_COMMUNITY): Payer: Self-pay

## 2017-05-03 ENCOUNTER — Emergency Department (HOSPITAL_COMMUNITY): Payer: Self-pay

## 2017-05-03 ENCOUNTER — Emergency Department (HOSPITAL_COMMUNITY)
Admission: EM | Admit: 2017-05-03 | Discharge: 2017-05-07 | Disposition: A | Discharge status: Home / Self Care | Payer: Self-pay | Attending: Emergency Medicine | Admitting: Emergency Medicine

## 2017-05-03 DIAGNOSIS — Z008 Encounter for other general examination: Secondary | ICD-10-CM

## 2017-05-03 DIAGNOSIS — I509 Heart failure, unspecified: Secondary | ICD-10-CM | POA: Insufficient documentation

## 2017-05-03 DIAGNOSIS — M79672 Pain in left foot: Secondary | ICD-10-CM | POA: Insufficient documentation

## 2017-05-03 DIAGNOSIS — Z955 Presence of coronary angioplasty implant and graft: Secondary | ICD-10-CM | POA: Insufficient documentation

## 2017-05-03 DIAGNOSIS — I110 Hypertensive heart disease with heart failure: Secondary | ICD-10-CM | POA: Insufficient documentation

## 2017-05-03 DIAGNOSIS — Z9114 Patient's other noncompliance with medication regimen: Secondary | ICD-10-CM | POA: Insufficient documentation

## 2017-05-03 DIAGNOSIS — Z590 Homelessness: Secondary | ICD-10-CM | POA: Insufficient documentation

## 2017-05-03 DIAGNOSIS — Z046 Encounter for general psychiatric examination, requested by authority: Secondary | ICD-10-CM | POA: Insufficient documentation

## 2017-05-03 DIAGNOSIS — F250 Schizoaffective disorder, bipolar type: Secondary | ICD-10-CM | POA: Insufficient documentation

## 2017-05-03 DIAGNOSIS — J449 Chronic obstructive pulmonary disease, unspecified: Secondary | ICD-10-CM | POA: Insufficient documentation

## 2017-05-03 DIAGNOSIS — H1033 Unspecified acute conjunctivitis, bilateral: Secondary | ICD-10-CM | POA: Insufficient documentation

## 2017-05-03 DIAGNOSIS — I2510 Atherosclerotic heart disease of native coronary artery without angina pectoris: Secondary | ICD-10-CM | POA: Insufficient documentation

## 2017-05-03 DIAGNOSIS — Z79899 Other long term (current) drug therapy: Secondary | ICD-10-CM | POA: Insufficient documentation

## 2017-05-03 DIAGNOSIS — R440 Auditory hallucinations: Secondary | ICD-10-CM

## 2017-05-03 DIAGNOSIS — Z7982 Long term (current) use of aspirin: Secondary | ICD-10-CM | POA: Insufficient documentation

## 2017-05-03 DIAGNOSIS — R441 Visual hallucinations: Secondary | ICD-10-CM

## 2017-05-03 DIAGNOSIS — M79671 Pain in right foot: Secondary | ICD-10-CM | POA: Insufficient documentation

## 2017-05-03 HISTORY — DX: Unspecified osteoarthritis, unspecified site: M19.90

## 2017-05-03 HISTORY — DX: Unspecified conjunctivitis: H10.9

## 2017-05-03 LAB — COMPREHENSIVE METABOLIC PANEL
ALBUMIN: 3.6 g/dL (ref 3.5–5.0)
ALK PHOS: 61 U/L (ref 38–126)
ALT: 21 U/L (ref 17–63)
AST: 44 U/L — AB (ref 15–41)
Anion gap: 9 (ref 5–15)
BILIRUBIN TOTAL: 1.4 mg/dL — AB (ref 0.3–1.2)
BUN: 19 mg/dL (ref 6–20)
CALCIUM: 9 mg/dL (ref 8.9–10.3)
CO2: 25 mmol/L (ref 22–32)
CREATININE: 1.5 mg/dL — AB (ref 0.61–1.24)
Chloride: 104 mmol/L (ref 101–111)
GFR calc Af Amer: >60 mL/min (ref >60)
GFR calc non Af Amer: 54 mL/min — ABNORMAL LOW (ref >60)
GLUCOSE: 136 mg/dL — AB (ref 65–99)
POTASSIUM: 3.3 mmol/L — AB (ref 3.5–5.1)
Sodium: 138 mmol/L (ref 135–145)
TOTAL PROTEIN: 6.3 g/dL — AB (ref 6.5–8.1)

## 2017-05-03 LAB — CBC
HCT: 38.5 % — ABNORMAL LOW (ref 39.0–52.0)
Hemoglobin: 12.6 g/dL — ABNORMAL LOW (ref 13.0–17.0)
MCH: 29 pg (ref 26.0–34.0)
MCHC: 32.7 g/dL (ref 30.0–36.0)
MCV: 88.5 fL (ref 78.0–100.0)
PLATELETS: 189 10*3/uL (ref 150–400)
RBC: 4.35 MIL/uL (ref 4.22–5.81)
RDW: 13 % (ref 11.5–15.5)
WBC: 5.1 10*3/uL (ref 4.0–10.5)

## 2017-05-03 LAB — RAPID URINE DRUG SCREEN, HOSP PERFORMED
AMPHETAMINES: POSITIVE — AB
BARBITURATES: NOT DETECTED
BENZODIAZEPINES: NOT DETECTED
COCAINE: POSITIVE — AB
Opiates: NOT DETECTED
Tetrahydrocannabinol: NOT DETECTED

## 2017-05-03 LAB — ETHANOL

## 2017-05-03 LAB — ACETAMINOPHEN LEVEL

## 2017-05-03 LAB — SALICYLATE LEVEL: Salicylate Lvl: <7 mg/dL (ref 2.8–30.0)
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
## 2017-05-03 NOTE — ED Notes (Signed)
Pt. Return from XRAY via stretcher. 

## 2017-05-04 ENCOUNTER — Encounter (HOSPITAL_COMMUNITY): Payer: Self-pay | Admitting: Registered Nurse

## 2017-05-04 MED ORDER — CARBAMAZEPINE 200 MG PO TABS
200.0000 mg | ORAL_TABLET | Freq: Every day | ORAL | Status: DC
  Administered 2017-05-04: 200 mg via ORAL
  Filled 2017-05-04 (×3): qty 1

## 2017-05-04 MED ORDER — ATORVASTATIN CALCIUM 20 MG PO TABS
20.0000 mg | ORAL_TABLET | Freq: Every day | ORAL | Status: DC
  Administered 2017-05-04: 20 mg via ORAL
  Filled 2017-05-04: qty 2
  Filled 2017-05-04 (×4): qty 1

## 2017-05-04 MED ORDER — ALBUTEROL SULFATE HFA 108 (90 BASE) MCG/ACT IN AERS: 2.0000 | INHALATION_SPRAY | Freq: Four times a day (QID) | RESPIRATORY_TRACT | Status: DC | PRN

## 2017-05-04 MED ORDER — ARIPIPRAZOLE ER 400 MG IM SRER: 400.0000 mg | INTRAMUSCULAR | Status: DC

## 2017-05-04 MED ORDER — GABAPENTIN 600 MG PO TABS
600.0000 mg | ORAL_TABLET | Freq: Three times a day (TID) | ORAL | Status: DC
  Administered 2017-05-04 – 2017-05-07 (×6): 600 mg via ORAL
  Filled 2017-05-04 (×8): qty 1
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
## 2017-05-04 NOTE — ED Notes (Signed)
Pt states that he doesn't want to take the medications because "they don't help".

## 2017-05-04 NOTE — Progress Notes (Signed)
Per Assunta FoundShuvon Rankin, NP, pt continues to meet criteria for inpatient treatment. Referral information has been fax[facility] the following [facility]: [facility]  [facility]  [facility]  [facility]  [facility]  [facility]  CSW will continue to assist with placement needs.   Wells GuilesSarah Maritssa Haughton, LCSW, LCAS Disposition CSW [facility] [phone]

## 2017-05-04 NOTE — ED Notes (Signed)
Patient was given [employer]raham Crackers, Cookies, and Peanut Butter w/ Coke.

## 2017-05-04 NOTE — BH Assessment (Signed)
Called Pod C at 01:40 to initiate TTS assessment. Pt was located in the hallway. Pt is being moved to a pvt setting. Per ED staff can call TA equipment in about 10 mins to begin assessment.

## 2017-05-04 NOTE — ED Notes (Signed)
Meal trey at bedside. Pt keeps going in and out of sleeping

## 2017-05-04 NOTE — ED Notes (Signed)
Patient denies pain and is resting comfortably.  

## 2017-05-04 NOTE — ED Notes (Signed)
Regular Diet was Ordered for Lunch, and Patient was given  A [place] Sandwich, Cookies, and Sprite.
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
## 2017-05-04 NOTE — ED Notes (Signed)
PATIENT IN A13 HAVING HIS TELE-ASSESSMENT

## 2017-05-05 ENCOUNTER — Other Ambulatory Visit: Payer: Self-pay

## 2017-05-05 MED ORDER — FLUORESCEIN SODIUM 1 MG OP STRP
1.0000 | ORAL_STRIP | Freq: Once | OPHTHALMIC | Status: AC
  Administered 2017-05-05: 1 via OPHTHALMIC
  Filled 2017-05-05: qty 1

## 2017-05-05 MED ORDER — TETRACAINE HCL 0.5 % OP SOLN
1.0000 [drp] | Freq: Once | OPHTHALMIC | Status: AC
  Administered 2017-05-05: 1 [drp] via OPHTHALMIC
  Filled 2017-05-05: qty 4

## 2017-05-05 MED ORDER — POLYMYXIN B-TRIMETHOPRIM 10000-0.1 UNIT/ML-% OP SOLN
1.0000 [drp] | OPHTHALMIC | Status: DC
  Administered 2017-05-05 – 2017-05-07 (×8): 1 [drp] via OPHTHALMIC
  Filled 2017-05-05 (×2): qty 10

## 2017-05-05 NOTE — ED Notes (Signed)
Snack and drink provided. ?

## 2017-05-05 NOTE — ED Notes (Signed)
MD aware patient complains of "something in left eye". RN assess, Matting noted to the eye. MD advised PA will come see patient
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
## 2017-05-05 NOTE — ED Notes (Signed)
Breakfast tray ordered 

## 2017-05-06 ENCOUNTER — Encounter (HOSPITAL_COMMUNITY): Payer: Self-pay | Admitting: *Deleted

## 2017-05-06 NOTE — ED Notes (Signed)
Pt refused Lipitor - states "I don't have a cholesterol problem. Can't you look at my records?"

## 2017-05-06 NOTE — ED Notes (Signed)
2nd breakfast tray ordered d/t pt does not eat pork.

## 2017-05-06 NOTE — ED Notes (Signed)
Patient denies pain and is resting comfortably.  

## 2017-05-06 NOTE — ED Notes (Signed)
Re-TTS being performed.  

## 2017-05-06 NOTE — BHH Counselor (Signed)
Re-assessment:   Has not eaten breakfast report the 1st breakfast concerned pork. When asked are you suicidal patient stated, "I am not suicidal I just do not see a purpose to my life. I do not want to be here." Patient denies HI and AVH. Patient continues to present with psychotic behaviors. Patient report he feels poison is in his body.   Patient continues to meet  Inpatient treatment.

## 2017-05-07 MED ORDER — ARIPIPRAZOLE 2 MG PO TABS
2.0000 mg | ORAL_TABLET | Freq: Every day | ORAL | Status: DC
  Filled 2017-05-07: qty 7

## 2017-05-07 MED ORDER — POLYMYXIN B-TRIMETHOPRIM 10000-0.1 UNIT/ML-% OP SOLN: 1.0000 [drp] | OPHTHALMIC | 0 refills | Status: DC

## 2017-05-07 MED ORDER — ARIPIPRAZOLE ER 400 MG IM SRER
400.0000 mg | Freq: Once | INTRAMUSCULAR | Status: AC
  Administered 2017-05-07: 400 mg via INTRAMUSCULAR
  Filled 2017-05-07: qty 2

## 2017-05-07 MED ORDER — ARIPIPRAZOLE 2 MG PO TABS: 2.0000 mg | ORAL_TABLET | Freq: Every day | ORAL | 0 refills | Status: DC

## 2017-05-07 NOTE — ED Notes (Signed)
Breakfast tray ordered 

## 2017-05-07 NOTE — ED Notes (Signed)
Re-TTS being performed.  

## 2017-05-07 NOTE — Care Management (Signed)
ED CM consulted by Pod F RN, [facility], patient is homeless. CM obtained discharge home medication Abilify from inpatient pharmacy.   CM discussed with SW covering, Shelter options sort out, [facility] possible lobby space. Patient provided [facility] voucher to [facility] no further ED CM needs identified.

## 2017-05-07 NOTE — BH Assessment (Signed)
Re-assessed pt to assist in determining if he continues to meet criteria for inpatient hospitalization. Pt states he slept "on and off" all night, which he thought could be due to being checked on. He states he ate some breakfast this morning and that his appetite is high; he states he's always hungry. Pt denies SI, HI, and NSSIB at this time. Pt states the last time he experienced AVH was a couple of days ago; he states he hears his name and other things through the television and the radio, as well as people whispering. He states he can also see things behind him. Clinician inquired about when pt had his last injection of Abilify; pt states it was upon leaving the mental health portion of [place] program in [place]; clinician inquired if pt was referring [facility], and pt stated that this was correct.

## 2017-05-07 NOTE — ED Notes (Signed)
Pt upset d/t unable to locate cell phone - states "I have important information I need to get [employer]". Pt given home med - Abilify - that was filled by CM. Voiced understanding to take one daily and to follow up w/[facility] tomorrow. CM was able to arrange for [facility] to [facility] - may stay in lobby/hallway. ALL Belongings - 1 labeled belongings bag - NO Valuables noted - returned to pt. RN verified no cell phone for pt in safe or Lost and Found. RN left message for pt's pastor as pt requested so may ask if he took pt's phone - [phone].Taxi Voucher given to Security who assisted w/calling taxi for pt and escorting him to lobby.

## 2017-05-07 NOTE — ED Notes (Signed)
Pt made phone call from desk - left 2 voicemails.

## 2017-05-07 NOTE — Consult Note (Signed)
Tele Assessment    Brett Anthony KnippaHinds, 47 y.o., male patient reassessed via [facility] by TTS this provider; chart reviewed and consulted with Dr. Lucianne MussKumar on 05/07/17.  On evaluation [name] reports that he is feeling much better other than eye irritation, eye pain and drainage.  Patient denies suicidal/self-harm/homicidal ideation.  Patient states that he was on Abilify Maintena and last injection was some time in January of this year after he was discharged from [place].  During assessment patient sitting up in bed eating lunch.  Patient is alert/oriented x 4; and calm/cooperative.  Patient reported that he was homeless and had no where to stay since he [facility] no longer [facility] the [facility].  He is agreeable to restarting his Abilify stating it worked for him.  Patient psychiatrically cleared  Recommendation:  Restarting Abilify Maintena 400 mg monthly (Give first injection today) and give prescription for Abilify 2 mg daily for 10 days until patient can follow up with [facility].  Also give patient community resource information for housing/shelters.     Spoke with Kriste BasqueBecky, RN and Dr. Juleen ChinaKohut related to above assessment, psych clearance, and medication recommendations.    Shuvon B. Rankin, NP

## 2017-05-07 NOTE — ED Notes (Signed)
Pharmacist advised obtaining Abilify inj from [facility].

## 2017-05-07 NOTE — ED Notes (Signed)
Pt voiced understanding of tx plan - d/c w/homeless shelter referrals. Pt asking if staff have found him a place to go. Advised CM - who will come speak w/pt.

## 2017-05-07 NOTE — ED Notes (Addendum)
Pt c/o that both of his eyes are "Itchy" and "it has to be an infection" "I want an antibiotic for my eyes".

## 2017-05-07 NOTE — ED Notes (Signed)
Eating snack given. 

## 2017-05-09 ENCOUNTER — Encounter: Payer: Self-pay | Admitting: Pediatric Intensive Care

## 2017-05-09 ENCOUNTER — Telehealth: Payer: Self-pay | Admitting: Pediatric Intensive Care

## 2017-05-09 NOTE — Telephone Encounter (Signed)
Contacted [facility] nurse case manager regarding need for ED follow up appointment.

## 2017-05-09 NOTE — Congregational Nurse Program (Signed)
Congregational Nurse Program Note  Date of Encounter: 05/09/2017  Past Medical History: Past Medical History:  Diagnosis Date  . Achilles tendon rupture-surgery 09/19/15 09/19/2015  . Altered mental status 12/23/2016  . Arthritis    "hips, knees, back" (10/13/2015)  . Bacterial conjunctivitis of right eye 2018  . Bipolar disorder (HCC)   . CAD -S/P PCI LAD/DES 10/13/15 10/14/2015  . CHF (congestive heart failure) (HCC)   . Cocaine abuse-drug sceen positive, History of 10/14/2015  . Coronary artery disease   . Depression   . DVT (deep venous thrombosis) (HCC) 09/2015   right  . Essential hypertension 11/30/2015  . Heart murmur   . High cholesterol   . Hypertension   . Myocardial infarction (HCC) 10/12/2015 X 2-3  . NSTEMI (non-ST elevated myocardial infarction) (HCC)   . Osteoarthritis   . Rectal myiasis   . Schizoaffective disorder, bipolar type (HCC) 10/14/2015  . Schizoaffective disorder, bipolar type (HCC)   . Sciatica     Encounter Details: CNP Questionnaire - 05/09/17 1015      Questionnaire   Patient Status  Not Applicable    Race  Black or African American    Location Patient Served At  [employer]    Insurance  Not Applicable    Uninsured  Uninsured (NEW 1x/quarter)    Food  No food insecurities    Housing/Utilities  No permanent housing    Transportation  Yes, need transportation assistance;Provided transportation assistance (bus pass, taxi voucher, etc.)    Interpersonal Safety  Yes, feel physically and emotionally safe where you currently live    Medication  Yes, have medication insecurities    Medical Provider  Yes    Referrals  Area Agency;Primary Care Provider/Clinic;[facility]/Care Connects    ED Visit Averted  Not Applicable    Life-Saving Intervention Made  Not Applicable      Client states that he had been to the ED and they had giving him a prescription for eye drops for conjunctivitis. He states that he had been taking Abilify but has been off medication since  November. He states that he was given an Abilify shot in the ED and that [facility]. He requests bus passes for appointment. CN requests that he follow up in clinic on Friday with paperwork [facility]. Client agrees to plan.

## 2017-05-12 ENCOUNTER — Telehealth: Payer: Self-pay | Admitting: Family Medicine

## 2017-05-12 NOTE — Telephone Encounter (Signed)
Call placed to patient #[phone] regarding scheduling a hospital follow up appointment with provider for this coming Monday 4/15 . Call dropped after several rings. Couldn't reach patient.

## 2017-05-12 NOTE — Telephone Encounter (Signed)
Update message sent to [place]ia [place], Engineer, watercongregational nurse.

## 2017-05-30 ENCOUNTER — Telehealth: Payer: Self-pay

## 2017-05-30 ENCOUNTER — Encounter: Payer: Self-pay | Admitting: Pediatric Intensive Care

## 2017-05-30 MED FILL — FLUoxetine HCL 20 MG CAPS: 20 | 30 days supply | Qty: 30 | Fill #0

## 2017-05-30 NOTE — Telephone Encounter (Signed)
Call received from Victoria Hussey, Charity fundraiser .[place] EnerShann Salinas about an appointment for the patient at [facility].  An appointment has been scheduled for 06/02/17 @ 1010 and [place] noted that she attempt to get the information to him.

## 2017-06-02 ENCOUNTER — Ambulatory Visit: Payer: Self-pay | Attending: Family Medicine | Admitting: Family Medicine

## 2017-06-02 ENCOUNTER — Encounter: Payer: Self-pay | Admitting: Family Medicine

## 2017-06-02 VITALS — BP 143/84 | HR 101 | Temp 97.4°F | Wt 216.8 lb

## 2017-06-02 DIAGNOSIS — Z91018 Allergy to other foods: Secondary | ICD-10-CM | POA: Insufficient documentation

## 2017-06-02 DIAGNOSIS — Z888 Allergy status to other drugs, medicaments and biological substances status: Secondary | ICD-10-CM | POA: Insufficient documentation

## 2017-06-02 DIAGNOSIS — Z86718 Personal history of other venous thrombosis and embolism: Secondary | ICD-10-CM | POA: Insufficient documentation

## 2017-06-02 DIAGNOSIS — Z9861 Coronary angioplasty status: Secondary | ICD-10-CM

## 2017-06-02 DIAGNOSIS — F250 Schizoaffective disorder, bipolar type: Secondary | ICD-10-CM | POA: Insufficient documentation

## 2017-06-02 DIAGNOSIS — J302 Other seasonal allergic rhinitis: Secondary | ICD-10-CM | POA: Insufficient documentation

## 2017-06-02 DIAGNOSIS — I252 Old myocardial infarction: Secondary | ICD-10-CM | POA: Insufficient documentation

## 2017-06-02 DIAGNOSIS — E7800 Pure hypercholesterolemia, unspecified: Secondary | ICD-10-CM | POA: Insufficient documentation

## 2017-06-02 DIAGNOSIS — M1990 Unspecified osteoarthritis, unspecified site: Secondary | ICD-10-CM | POA: Insufficient documentation

## 2017-06-02 DIAGNOSIS — Z79899 Other long term (current) drug therapy: Secondary | ICD-10-CM | POA: Insufficient documentation

## 2017-06-02 DIAGNOSIS — Z91013 Allergy to seafood: Secondary | ICD-10-CM | POA: Insufficient documentation

## 2017-06-02 DIAGNOSIS — I110 Hypertensive heart disease with heart failure: Secondary | ICD-10-CM | POA: Insufficient documentation

## 2017-06-02 DIAGNOSIS — M170 Bilateral primary osteoarthritis of knee: Secondary | ICD-10-CM | POA: Insufficient documentation

## 2017-06-02 DIAGNOSIS — Z955 Presence of coronary angioplasty implant and graft: Secondary | ICD-10-CM | POA: Insufficient documentation

## 2017-06-02 DIAGNOSIS — J438 Other emphysema: Secondary | ICD-10-CM | POA: Insufficient documentation

## 2017-06-02 DIAGNOSIS — I2510 Atherosclerotic heart disease of native coronary artery without angina pectoris: Secondary | ICD-10-CM | POA: Insufficient documentation

## 2017-06-02 DIAGNOSIS — I10 Essential (primary) hypertension: Secondary | ICD-10-CM

## 2017-06-02 DIAGNOSIS — I509 Heart failure, unspecified: Secondary | ICD-10-CM | POA: Insufficient documentation

## 2017-06-02 MED ORDER — FLUTICASONE PROPIONATE 50 MCG/ACT NA SUSP: 2.0000 | Freq: Every day | NASAL | 6 refills | Status: DC

## 2017-06-02 MED ORDER — OLOPATADINE HCL 0.1 % OP SOLN: 1.0000 [drp] | Freq: Two times a day (BID) | OPHTHALMIC | 3 refills | Status: DC

## 2017-06-02 MED ORDER — DICLOFENAC SODIUM 1 % TD GEL: 4.0000 g | Freq: Four times a day (QID) | TRANSDERMAL | 3 refills | Status: DC

## 2017-06-02 MED ORDER — CETIRIZINE HCL 10 MG PO TABS: 10.0000 mg | ORAL_TABLET | Freq: Every day | ORAL | 3 refills | Status: DC

## 2017-06-02 MED ORDER — ALBUTEROL SULFATE HFA 108 (90 BASE) MCG/ACT IN AERS: 2.0000 | INHALATION_SPRAY | Freq: Four times a day (QID) | RESPIRATORY_TRACT | 3 refills | Status: DC | PRN

## 2017-06-02 MED FILL — FLUTICASONE PROP 50 MCG SPR: 50 | 30 days supply | Qty: 16 | Fill #0

## 2017-06-02 MED FILL — OLOPATADINE HCL 0.1% EYE DR: 0.1 | 37 days supply | Qty: 5 | Fill #0

## 2017-06-02 MED FILL — $VENTOLIN HFA 18G INHALER: 108 (90 BAS | 25 days supply | Qty: 18 | Fill #0

## 2017-06-02 MED FILL — DICLOFENAC SODIUM 1% GEL: 1 | 16 days supply | Qty: 100 | Fill #0

## 2017-06-02 NOTE — Patient Instructions (Signed)

## 2017-06-02 NOTE — Progress Notes (Signed)
Subjective:  Patient ID: Brett Salinas, male    DOB: [birth year]  Age: 47 y.o. MRN: [id number]  CC: Hypertension   HPI Brett Salinas is a 47 year old male with a history of schizoaffective disorder, coronary artery disease status post PCI with drug-eluting stent (completed dual antiplatelet therapy with Plavix and aspirin), osteoarthritis of the  knees who presents today for a follow up visit. He complains of bilateral knee pain left greater than right which is chronic and is status post cortisone shots with his last shot by Dr. Allena Katz in 12/2016.  He was placed on gabapentin by Dr Allena Katz which he states does not help with his symptoms.  Pain is worse with going up and down the stairs and is described as moderate. He complains of itchy eyes, tearing, foreign body sensation and informs me "allergies are messing with me"; he has had some rhinorrhea and sneezing but denies sinus pressure or ear pain.  He is out of all his medications and would need refills and when asked about his statin states I do need to take that.  Last seen by cardiology, Dr. Andreas Ohm on 05/2016 at which time he had agreed to take his Lipitor and metoprolol. He endorses intermittent visual and auditory hallucinations which he states are "spiritual" ;receives Risperdal shots at [facility] by his psychiatrist and is also undergoing therapy sessions.  He denies suicidal ideations or intent.  Past Medical History:  Diagnosis Date  . Achilles tendon rupture-surgery 09/19/15 09/19/2015  . Altered mental status 12/23/2016  . Arthritis    "hips, knees, back" (10/13/2015)  . Bacterial conjunctivitis of right eye 2018  . Bipolar disorder (HCC)   . CAD -S/P PCI LAD/DES 10/13/15 10/14/2015  . CHF (congestive heart failure) (HCC)   . Cocaine abuse-drug sceen positive, History of 10/14/2015  . Coronary artery disease   . Depression   . DVT (deep venous thrombosis) (HCC) 09/2015   right  . Essential hypertension 11/30/2015   . Heart murmur   . High cholesterol   . Hypertension   . Myocardial infarction (HCC) 10/12/2015 X 2-3  . NSTEMI (non-ST elevated myocardial infarction) (HCC)   . Osteoarthritis   . Rectal myiasis   . Schizoaffective disorder, bipolar type (HCC) 10/14/2015  . Schizoaffective disorder, bipolar type (HCC)   . Sciatica     Past Surgical History:  Procedure Laterality Date  . CARDIAC CATHETERIZATION N/A 10/13/2015   Procedure: Left Heart Cath and Coronary Angiography;  Surgeon: Corky Crafts, MD;  Location: [facility] INVASIVE CV LAB;  Service: Cardiovascular;  Laterality: N/A;  . CARDIAC CATHETERIZATION N/A 10/13/2015   Procedure: Coronary Stent Intervention;  Surgeon: Corky Crafts, MD;  [facility] CV LAB;  Service: Cardiovascular;  Laterality: N/A;  . CARDIAC CATHETERIZATION N/A 10/13/2015   Procedure: Intravascular Ultrasound/IVUS;  Surgeon: Corky Crafts, MD;  Location: [facility] INVASIVE CV LAB;  Service: Cardiovascular;  Laterality: N/A;  . CORONARY ANGIOPLASTY WITH STENT PLACEMENT  10/13/2015  . I&D EXTREMITY Right 09/19/2015   Procedure: IRRIGATION AND DEBRIDEMENT ANKLE LACERATIONS POSSIBLE TENDON REPAIR;  Surgeon: Cammy Copa, MD;  Location: [facility] OR;  Service: Orthopedics;  Laterality: Right;  . I&D EXTREMITY Right 10/14/2015   Procedure: IRRIGATION AND DEBRIDEMENT EXTREMITY/Right Ankle;  Surgeon: Cammy Copa, MD;  Location: [facility] OR;  Service: Orthopedics;  Laterality: Right;    Allergies  Allergen Reactions  . 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 Heparin Itching    Full-body itching inc throat  . Pork-Derived Products  Itching    Burning/itching     Outpatient Medications Prior to Visit  Medication Sig Dispense Refill  . ARIPiprazole (ABILIFY) 2 MG tablet Take 1 tablet (2 mg total) by mouth daily. (Patient not taking: Reported on 06/02/2017) 10 tablet 0  . ARIPiprazole ER 400 MG SRER Inject 400 mg into the muscle every 28 (twenty-eight) days. Next dose  on 01/26/2017 (Patient not taking: Reported on 06/02/2017) 1 each 1  . aspirin EC 81 MG tablet Take 1 tablet (81 mg total) by mouth daily. (Patient not taking: Reported on 06/02/2017) 90 tablet 3  . atorvastatin (LIPITOR) 20 MG tablet Take 1 tablet (20 mg total) by mouth daily. (Patient not taking: Reported on 06/02/2017) 90 tablet 1  . gabapentin (NEURONTIN) 600 MG tablet Take 1 tablet (600 mg total) by mouth 3 (three) times daily. (Patient not taking: Reported on 06/02/2017) 90 tablet 1  . gabapentin (NEURONTIN) 600 MG tablet TAKE 1 TABLET BY MOUTH 3 TIMES DAILY. (Patient not taking: Reported on 06/02/2017) 90 tablet 1  . Multiple Vitamin (MULTIVITAMIN WITH MINERALS) TABS tablet Take 1 tablet by mouth daily. (Patient not taking: Reported on 06/02/2017) 30 tablet 0  . Multiple Vitamin (MULTIVITAMIN WITH MINERALS) TABS tablet Take 1 tablet by mouth daily. (Patient not taking: Reported on 06/02/2017) 30 tablet 0  . nitroGLYCERIN (NITROSTAT) 0.4 MG SL tablet Place 1 tablet (0.4 mg total) under the tongue every 5 (five) minutes x 3 doses as needed for chest pain. (Patient not taking: Reported on 06/02/2017) 30 tablet 12  . terbinafine (LAMISIL) 1 % cream Apply topically daily. (Patient not taking: Reported on 06/02/2017) 30 g 0  . terbinafine (LAMISIL) 1 % cream Apply topically daily. (Patient not taking: Reported on 06/02/2017) 30 g 0  . trimethoprim-polymyxin b (POLYTRIM) ophthalmic solution Place 1 drop into both eyes every 4 (four) hours. Every 4 hours while awake for 5 days. (Patient not taking: Reported on 06/02/2017) 10 mL 0  . albuterol (VENTOLIN HFA) 108 (90 Base) MCG/ACT inhaler Inhale 2 puffs into the lungs every 6 (six) hours as needed for wheezing or shortness of breath. (Patient not taking: Reported on 06/02/2017) 54 g 3  . fluticasone (FLONASE) 50 MCG/ACT nasal spray Place 2 sprays into both nostrils daily. (Patient not taking: Reported on 06/02/2017) 16 g 6   No facility-administered medications prior to visit.      ROS Review of Systems  Constitutional: Negative for activity change and appetite change.  HENT: Positive for rhinorrhea. Negative for sinus pressure and sore throat.   Eyes: Positive for discharge and itching. Negative for visual disturbance.  Respiratory: Negative for cough, chest tightness and shortness of breath.   Cardiovascular: Negative for chest pain and leg swelling.  Gastrointestinal: Negative for abdominal distention, abdominal pain, constipation and diarrhea.  Endocrine: Negative.   Genitourinary: Negative for dysuria.  Musculoskeletal:       Knee pain  Skin: Negative for rash.  Allergic/Immunologic: Negative.   Neurological: Negative for weakness, light-headedness and numbness.  Psychiatric/Behavioral: Positive for hallucinations. Negative for dysphoric mood and suicidal ideas.    Objective:  BP (!) 143/84   Pulse (!) 101   Temp (!) 97.4 F (36.3 C) (Oral)   Wt 216 lb 12.8 oz (98.3 kg)   SpO2 97%   BMI 24.42 kg/m   BP/Weight 06/02/2017 05/07/2017 05/03/2017  Systolic BP 143 136 -  Diastolic BP 84 89 -  Wt. (Lbs) 216.8 - 210  BMI 24.42 - 23.66  Some encounter information is  confidential and restricted. Go to Review Flowsheets activity to see all data.     Physical Exam  Constitutional: He is oriented to person, place, and time. He appears well-developed and well-nourished.  HENT:  Right Ear: External ear normal.  Left Ear: External ear normal.  Mouth/Throat: Oropharynx is clear and moist.  Eyes: Conjunctivae and EOM are normal. Right eye exhibits no discharge. Left eye exhibits no discharge.  Cardiovascular: Normal rate, normal heart sounds and intact distal pulses.  No murmur heard. Pulmonary/Chest: Effort normal and breath sounds normal. He has no wheezes. He has no rales. He exhibits no tenderness.  Abdominal: Soft. Bowel sounds are normal. He exhibits no distension and no mass. There is no tenderness.  Musculoskeletal: Normal range of motion.  No  tenderness to palpation of lateral or medial joint lines Crepitus on range of motion with associated tenderness  Neurological: He is alert and oriented to person, place, and time.  Skin: Skin is warm and dry.  Psychiatric: He has a normal mood and affect.     Assessment & Plan:   1. Essential hypertension Slightly elevated He currently does not take any medications informs me his blood pressure is controlled and does not need one Counseled on blood pressure goal of less than 130/80, low-sodium, DASH diet, medication compliance, 150 minutes of moderate intensity exercise per week. Discussed medication compliance, adverse effects. Previously on metoprolol, will commence at next visit if still elevated - Basic Metabolic Panel  2. Schizoaffective disorder, bipolar type (HCC) Currently on Risperdal shots Follow-up with psychiatry at [facility]  3. CAD -S/P PCI LAD/DES 10/13/15 Asymptomatic Advised of the need to be compliant with statin which he declines - emphasized the need to be compliant with medications and implications of noncompliance. Risk factor modification Follow-up with cardiology  4. Other emphysema (HCC) Stable - albuterol (VENTOLIN HFA) 108 (90 Base) MCG/ACT inhaler; Inhale 2 puffs into the lungs every 6 (six) hours as needed for wheezing or shortness of breath.  Dispense: 54 g; Refill: 3  5. Primary osteoarthritis of both knees This was cortisone injections by Dr. Allena Katz Continue gabapentin We will add on diclofenac to regimen - diclofenac sodium (VOLTAREN) 1 % GEL; Apply 4 g topically 4 (four) times daily.  Dispense: 100 g; Refill: 3  6. Seasonal allergies - fluticasone (FLONASE) 50 MCG/ACT nasal spray; Place 2 sprays into both nostrils daily.  Dispense: 16 g; Refill: 6 - cetirizine (ZYRTEC) 10 MG tablet; Take 1 tablet (10 mg total) by mouth daily.  Dispense: 30 tablet; Refill: 3 - olopatadine (PATANOL) 0.1 % ophthalmic solution; Place 1 drop into both eyes 2 (two)  times daily.  Dispense: 5 mL; Refill: 3   Meds ordered this encounter  Medications  . albuterol (VENTOLIN HFA) 108 (90 Base) MCG/ACT inhaler    Sig: Inhale 2 puffs into the lungs every 6 (six) hours as needed for wheezing or shortness of breath.    Dispense:  54 g    Refill:  3  . fluticasone (FLONASE) 50 MCG/ACT nasal spray    Sig: Place 2 sprays into both nostrils daily.    Dispense:  16 g    Refill:  6  . cetirizine (ZYRTEC) 10 MG tablet    Sig: Take 1 tablet (10 mg total) by mouth daily.    Dispense:  30 tablet    Refill:  3  . olopatadine (PATANOL) 0.1 % ophthalmic solution    Sig: Place 1 drop into both eyes 2 (two) times daily.  Dispense:  5 mL    Refill:  3  . diclofenac sodium (VOLTAREN) 1 % GEL    Sig: Apply 4 g topically 4 (four) times daily.    Dispense:  100 g    Refill:  3    Follow-up: Return in about 3 months (around 09/02/2017) for follow up of chronic medical conditions.   Hoy Register MD

## 2017-06-03 LAB — BASIC METABOLIC PANEL
BUN / CREAT RATIO: 15 (ref 9–20)
BUN: 21 mg/dL (ref 6–24)
CALCIUM: 9.7 mg/dL (ref 8.7–10.2)
CHLORIDE: 104 mmol/L (ref 96–106)
CO2: 26 mmol/L (ref 20–29)
CREATININE: 1.44 mg/dL — AB (ref 0.76–1.27)
GFR calc non Af Amer: 57 mL/min/{1.73_m2} — ABNORMAL LOW (ref >59)
GFR, EST AFRICAN AMERICAN: 66 mL/min/{1.73_m2} (ref >59)
Glucose: 75 mg/dL (ref 65–99)
Potassium: 4 mmol/L (ref 3.5–5.2)
Sodium: 143 mmol/L (ref 134–144)

## 2017-06-08 NOTE — Congregational Nurse Program (Signed)
Congregational Nurse Program Note  Date of Encounter: 05/30/2017  Past Medical History: Past Medical History:  Diagnosis Date  . Achilles tendon rupture-surgery 09/19/15 09/19/2015  . Altered mental status 12/23/2016  . Arthritis    "hips, knees, back" (10/13/2015)  . Bacterial conjunctivitis of right eye 2018  . Bipolar disorder (HCC)   . CAD -S/P PCI LAD/DES 10/13/15 10/14/2015  . CHF (congestive heart failure) (HCC)   . Cocaine abuse-drug sceen positive, History of 10/14/2015  . Coronary artery disease   . Depression   . DVT (deep venous thrombosis) (HCC) 09/2015   right  . Essential hypertension 11/30/2015  . Heart murmur   . High cholesterol   . Hypertension   . Myocardial infarction (HCC) 10/12/2015 X 2-3  . NSTEMI (non-ST elevated myocardial infarction) (HCC)   . Osteoarthritis   . Rectal myiasis   . Schizoaffective disorder, bipolar type (HCC) 10/14/2015  . Schizoaffective disorder, bipolar type (HCC)   . Sciatica     Encounter Details: CNP Questionnaire - 05/30/17 0845      Questionnaire   Patient Status  Not Applicable    Race  Black or African American    Location Patient Served At  [employer]  Not Applicable    Uninsured  Uninsured (Subsequent visits/quarter)    Food  No food insecurities    Housing/Utilities  No permanent housing    Transportation  Yes, need transportation assistance;Provided transportation assistance (bus pass, taxi voucher, etc.)    Interpersonal Safety  Yes, feel physically and emotionally safe where you currently live    Medication  Yes, have medication insecurities    Medical Provider  Yes    Referrals  Primary Care Provider/Clinic;Area Agency    ED Visit Averted  Not Applicable    Life-Saving Intervention Made  Not Applicable      Clinical Intake - 06/02/17 1009      Pre-visit preparation   Pre-visit preparation completed  Yes      Pain   Pain   0-10    Pain Score  7     Pain Location  Knee      Nutrition Screen   Diabetes  No      Functional Status   Activities of Daily Living  Independent    Ambulation  Independent    Medication Administration  Independent    Home Management  Independent      Abuse/Neglect   Do you feel unsafe in your current relationship?  No    Do you feel physically threatened by others?  No    Anyone hurting you at home, work, or school?  No    Unable to ask?  No      Web designer Needed?  No     BP check- CN provided reading glasses and bus passes for appointment at [facility]. Client states he is going to [facility] for his [facility] medication management.

## 2017-06-13 ENCOUNTER — Encounter: Payer: Self-pay | Admitting: Pediatric Intensive Care

## 2017-06-13 NOTE — Congregational Nurse Program (Signed)
Congregational Nurse Program Note  Date of Encounter: 06/13/2017  Past Medical History: Past Medical History:  Diagnosis Date  . Achilles tendon rupture-surgery 09/19/15 09/19/2015  . Altered mental status 12/23/2016  . Arthritis    "hips, knees, back" (10/13/2015)  . Bacterial conjunctivitis of right eye 2018  . Bipolar disorder (HCC)   . CAD -S/P PCI LAD/DES 10/13/15 10/14/2015  . CHF (congestive heart failure) (HCC)   . Cocaine abuse-drug sceen positive, History of 10/14/2015  . Coronary artery disease   . Depression   . DVT (deep venous thrombosis) (HCC) 09/2015   right  . Essential hypertension 11/30/2015  . Heart murmur   . High cholesterol   . Hypertension   . Myocardial infarction (HCC) 10/12/2015 X 2-3  . NSTEMI (non-ST elevated myocardial infarction) (HCC)   . Osteoarthritis   . Rectal myiasis   . Schizoaffective disorder, bipolar type (HCC) 10/14/2015  . Schizoaffective disorder, bipolar type (HCC)   . Sciatica     Encounter Details: CNP Questionnaire - 06/13/17 0930      Questionnaire   Patient Status  Not Applicable    Race  Black or African American    Location Patient Served At  [employer]  Not Applicable    Uninsured  Uninsured (Subsequent visits/quarter)    Food  No food insecurities    Housing/Utilities  No permanent housing    Transportation  Yes, need transportation assistance    Interpersonal Safety  Yes, feel physically and emotionally safe where you currently live    Medication  No medication insecurities    Medical Provider  Yes    Referrals  Not Applicable    ED Visit Averted  Not Applicable    Life-Saving Intervention Made  Not Applicable      Clinical Intake - 06/02/17 1009      Pre-visit preparation   Pre-visit preparation completed  Yes      Pain   Pain   0-10    Pain Score  7     Pain Location  Knee      Nutrition Screen   Diabetes  No      Functional Status   Activities of Daily Living  Independent    Ambulation   Independent    Medication Administration  Independent    Home Management  Independent      Abuse/Neglect   Do you feel unsafe in your current relationship?  No    Do you feel physically threatened by others?  No    Anyone hurting you at home, work, or school?  No    Unable to ask?  No      Web designer Needed?  No     States he feels like allergies are acting up. Sclera remain somewhat red. Reading glasses given.

## 2017-06-19 ENCOUNTER — Telehealth: Payer: Self-pay

## 2017-06-19 NOTE — Telephone Encounter (Signed)
Call received from [facility] with [facility] noting the the fax has not been coming through, they have only received the cover sheet at 3 different times.   The document was re-faxed 3 mores times from 2 different and the fax report indicates the fax has failed each time.  Will need to attempt to re-fax at a later date.

## 2017-06-19 NOTE — Telephone Encounter (Signed)
Application for [facility] transportation faxed to [employer]  - fax # [phone]

## 2017-07-18 ENCOUNTER — Encounter: Payer: Self-pay | Admitting: Pediatric Intensive Care

## 2017-08-09 NOTE — Congregational Nurse Program (Signed)
Congregational Nurse Program Note  Date of Encounter: 07/18/2017  Past Medical History: Past Medical History:  Diagnosis Date  . Achilles tendon rupture-surgery 09/19/15 09/19/2015  . Altered mental status 12/23/2016  . Arthritis    "hips, knees, back" (10/13/2015)  . Bacterial conjunctivitis of right eye 2018  . Bipolar disorder (HCC)   . CAD -S/P PCI LAD/DES 10/13/15 10/14/2015  . CHF (congestive heart failure) (HCC)   . Cocaine abuse-drug sceen positive, History of 10/14/2015  . Coronary artery disease   . Depression   . DVT (deep venous thrombosis) (HCC) 09/2015   right  . Essential hypertension 11/30/2015  . Heart murmur   . High cholesterol   . Hypertension   . Myocardial infarction (HCC) 10/12/2015 X 2-3  . NSTEMI (non-ST elevated myocardial infarction) (HCC)   . Osteoarthritis   . Rectal myiasis   . Schizoaffective disorder, bipolar type (HCC) 10/14/2015  . Schizoaffective disorder, bipolar type (HCC)   . Sciatica     Encounter Details: CNP Questionnaire - 07/18/17 0830      Questionnaire   Patient Status  Not Applicable    Race  Black or African American    [facility]    Insurance  Not Applicable    Uninsured  Uninsured (Subsequent visits/quarter)    Food  No food insecurities    Housing/Utilities  No permanent housing    Transportation  No transportation needs    Interpersonal Safety  Yes, feel physically and emotionally safe where you currently live    Medication  No medication insecurities    Medical Provider  Yes    Referrals  Not Applicable    ED Visit Averted  Not Applicable    Life-Saving Intervention Made  Not Applicable     BP check.

## 2017-08-14 ENCOUNTER — Encounter: Payer: Self-pay | Admitting: Pediatric Intensive Care

## 2017-08-15 ENCOUNTER — Encounter: Payer: Self-pay | Admitting: Family Medicine

## 2017-08-15 ENCOUNTER — Ambulatory Visit: Payer: Self-pay | Attending: Family Medicine | Admitting: Family Medicine

## 2017-08-15 ENCOUNTER — Other Ambulatory Visit: Payer: Self-pay | Admitting: Family Medicine

## 2017-08-15 VITALS — BP 109/69 | HR 69 | Temp 97.9°F | Wt 208.8 lb

## 2017-08-15 DIAGNOSIS — R05 Cough: Secondary | ICD-10-CM | POA: Insufficient documentation

## 2017-08-15 DIAGNOSIS — Z7982 Long term (current) use of aspirin: Secondary | ICD-10-CM | POA: Insufficient documentation

## 2017-08-15 DIAGNOSIS — E7800 Pure hypercholesterolemia, unspecified: Secondary | ICD-10-CM | POA: Insufficient documentation

## 2017-08-15 DIAGNOSIS — I110 Hypertensive heart disease with heart failure: Secondary | ICD-10-CM | POA: Insufficient documentation

## 2017-08-15 DIAGNOSIS — I2510 Atherosclerotic heart disease of native coronary artery without angina pectoris: Secondary | ICD-10-CM | POA: Insufficient documentation

## 2017-08-15 DIAGNOSIS — I509 Heart failure, unspecified: Secondary | ICD-10-CM | POA: Insufficient documentation

## 2017-08-15 DIAGNOSIS — Z955 Presence of coronary angioplasty implant and graft: Secondary | ICD-10-CM | POA: Insufficient documentation

## 2017-08-15 DIAGNOSIS — F319 Bipolar disorder, unspecified: Secondary | ICD-10-CM | POA: Insufficient documentation

## 2017-08-15 DIAGNOSIS — Z7902 Long term (current) use of antithrombotics/antiplatelets: Secondary | ICD-10-CM | POA: Insufficient documentation

## 2017-08-15 DIAGNOSIS — Z888 Allergy status to other drugs, medicaments and biological substances status: Secondary | ICD-10-CM | POA: Insufficient documentation

## 2017-08-15 DIAGNOSIS — I252 Old myocardial infarction: Secondary | ICD-10-CM | POA: Insufficient documentation

## 2017-08-15 DIAGNOSIS — J302 Other seasonal allergic rhinitis: Secondary | ICD-10-CM | POA: Insufficient documentation

## 2017-08-15 DIAGNOSIS — F259 Schizoaffective disorder, unspecified: Secondary | ICD-10-CM | POA: Insufficient documentation

## 2017-08-15 DIAGNOSIS — Z79899 Other long term (current) drug therapy: Secondary | ICD-10-CM | POA: Insufficient documentation

## 2017-08-15 DIAGNOSIS — R059 Cough, unspecified: Secondary | ICD-10-CM

## 2017-08-15 MED ORDER — GUAIFENESIN-CODEINE 100-10 MG/5ML PO SYRP
5.0000 mL | ORAL_SOLUTION | Freq: Three times a day (TID) | ORAL | 0 refills | Status: DC | PRN
Start: 2017-08-15 — End: 2018-09-27

## 2017-08-15 MED ORDER — CETIRIZINE HCL 10 MG PO TABS: 10.0000 mg | ORAL_TABLET | Freq: Every day | ORAL | 1 refills | Status: DC

## 2017-08-15 MED FILL — OLOPATADINE HCL 0.1% EYE DR: 0.1 | 18 days supply | Qty: 5 | Fill #1

## 2017-08-15 MED FILL — GUAIFENESIN AC COUGH SYRUP: 100-10 | 8 days supply | Qty: 120 | Fill #0

## 2017-08-15 MED FILL — FLUTICASONE PROP 50 MCG SPR: 50 | 30 days supply | Qty: 16 | Fill #1

## 2017-08-15 MED FILL — $VENTOLIN HFA 18G INHALER: 108 (90 BAS | 25 days supply | Qty: 18 | Fill #1

## 2017-08-15 MED FILL — FLUoxetine HCL 20 MG CAPS: 20 | 30 days supply | Qty: 30 | Fill #1

## 2017-08-15 MED FILL — ?ATORVASTATIN 20 MG TABLET: 20 | 30 days supply | Qty: 30 | Fill #0

## 2017-08-15 MED FILL — DICLOFENAC SODIUM 1% GEL: 1 | 16 days supply | Qty: 100 | Fill #1

## 2017-08-15 MED FILL — ?CETIRIZINE HCL 10 MG TABLE: 10 | 30 days supply | Qty: 30 | Fill #0

## 2017-08-15 NOTE — Progress Notes (Signed)
Subjective:  Patient ID: Brett Salinas, male    DOB: [birth year]  Age: 47 y.o. MRN: [id number]  CC: Cough   HPI Brett Salinas s a 47 year old male with a history of schizoaffective disorder, coronary artery disease status post PCI with drug-eluting stent (completed dual antiplatelet therapy with Plavix and aspirin), osteoarthritis of the  knees who presents today complaining of a 2-week history of cough productive of sputum which was initially greenish and then progressed to yellow phlegm now is white in color with associated pressure in his head, postnasal drip.  He does have abdominal pains, chest pain and back pain with his cough and has noticed some night sweats.  He has also noticed breaking out the lesions on his upper lip on right nostril.  Denies fever. He has not used any OTC medications.  Past Medical History:  Diagnosis Date  . Achilles tendon rupture-surgery 09/19/15 09/19/2015  . Altered mental status 12/23/2016  . Arthritis    "hips, knees, back" (10/13/2015)  . Bacterial conjunctivitis of right eye 2018  . Bipolar disorder (HCC)   . CAD -S/P PCI LAD/DES 10/13/15 10/14/2015  . CHF (congestive heart failure) (HCC)   . Cocaine abuse-drug sceen positive, History of 10/14/2015  . Coronary artery disease   . Depression   . DVT (deep venous thrombosis) (HCC) 09/2015   right  . Essential hypertension 11/30/2015  . Heart murmur   . High cholesterol   . Hypertension   . Myocardial infarction (HCC) 10/12/2015 X 2-3  . NSTEMI (non-ST elevated myocardial infarction) (HCC)   . Osteoarthritis   . Rectal myiasis   . Schizoaffective disorder, bipolar type (HCC) 10/14/2015  . Schizoaffective disorder, bipolar type (HCC)   . Sciatica     Past Surgical History:  Procedure Laterality Date  . CARDIAC CATHETERIZATION N/A 10/13/2015   Procedure: Left Heart Cath and Coronary Angiography;  Surgeon: Corky Crafts, MD;  Location: [facility] INVASIVE CV LAB;  Service: Cardiovascular;   Laterality: N/A;  . CARDIAC CATHETERIZATION N/A 10/13/2015   Procedure: Coronary Stent Intervention;  Surgeon: Corky Crafts, MD;  Location: [facility] INVASIVE CV LAB;  Service: Cardiovascular;  Laterality: N/A;  . CARDIAC CATHETERIZATION N/A 10/13/2015   Procedure: Intravascular Ultrasound/IVUS;  Surgeon: Corky Crafts, MD;  Location: [facility] INVASIVE CV LAB;  Service: Cardiovascular;  Laterality: N/A;  . CORONARY ANGIOPLASTY WITH STENT PLACEMENT  10/13/2015  . I&D EXTREMITY Right 09/19/2015   Procedure: IRRIGATION AND DEBRIDEMENT ANKLE LACERATIONS POSSIBLE TENDON REPAIR;  Surgeon: Cammy Copa, MD;  Location: [facility] OR;  Service: Orthopedics;  Laterality: Right;  . I&D EXTREMITY Right 10/14/2015   Procedure: IRRIGATION AND DEBRIDEMENT EXTREMITY/Right Ankle;  Surgeon: Cammy Copa, MD;  Location: [facility] OR;  Service: Orthopedics;  Laterality: Right;    Allergies  Allergen Reactions  . 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 Heparin Itching    Full-body itching inc throat  . Pork-Derived Products Itching    Burning/itching     Outpatient Medications Prior to Visit  Medication Sig Dispense Refill  . albuterol (VENTOLIN HFA) 108 (90 Base) MCG/ACT inhaler Inhale 2 puffs into the lungs every 6 (six) hours as needed for wheezing or shortness of breath. 54 g 3  . ARIPiprazole (ABILIFY) 2 MG tablet Take 1 tablet (2 mg total) by mouth daily. (Patient not taking: Reported on 06/02/2017) 10 tablet 0  . ARIPiprazole ER 400 MG SRER Inject 400 mg into the muscle every 28 (twenty-eight) days. Next dose on 01/26/2017 (Patient not taking:  Reported on 06/02/2017) 1 each 1  . aspirin EC 81 MG tablet Take 1 tablet (81 mg total) by mouth daily. (Patient not taking: Reported on 06/02/2017) 90 tablet 3  . atorvastatin (LIPITOR) 20 MG tablet Take 1 tablet (20 mg total) by mouth daily. (Patient not taking: Reported on 06/02/2017) 90 tablet 1  . diclofenac sodium (VOLTAREN) 1 % GEL Apply 4 g topically 4 (four) times  daily. (Patient not taking: Reported on 08/15/2017) 100 g 3  . fluticasone (FLONASE) 50 MCG/ACT nasal spray Place 2 sprays into both nostrils daily. (Patient not taking: Reported on 08/15/2017) 16 g 6  . gabapentin (NEURONTIN) 600 MG tablet Take 1 tablet (600 mg total) by mouth 3 (three) times daily. (Patient not taking: Reported on 06/02/2017) 90 tablet 1  . gabapentin (NEURONTIN) 600 MG tablet TAKE 1 TABLET BY MOUTH 3 TIMES DAILY. (Patient not taking: Reported on 06/02/2017) 90 tablet 1  . Multiple Vitamin (MULTIVITAMIN WITH MINERALS) TABS tablet Take 1 tablet by mouth daily. (Patient not taking: Reported on 06/02/2017) 30 tablet 0  . Multiple Vitamin (MULTIVITAMIN WITH MINERALS) TABS tablet Take 1 tablet by mouth daily. (Patient not taking: Reported on 06/02/2017) 30 tablet 0  . nitroGLYCERIN (NITROSTAT) 0.4 MG SL tablet Place 1 tablet (0.4 mg total) under the tongue every 5 (five) minutes x 3 doses as needed for chest pain. (Patient not taking: Reported on 06/02/2017) 30 tablet 12  . olopatadine (PATANOL) 0.1 % ophthalmic solution Place 1 drop into both eyes 2 (two) times daily. (Patient not taking: Reported on 08/15/2017) 5 mL 3  . terbinafine (LAMISIL) 1 % cream Apply topically daily. (Patient not taking: Reported on 06/02/2017) 30 g 0  . terbinafine (LAMISIL) 1 % cream Apply topically daily. (Patient not taking: Reported on 06/02/2017) 30 g 0  . trimethoprim-polymyxin b (POLYTRIM) ophthalmic solution Place 1 drop into both eyes every 4 (four) hours. Every 4 hours while awake for 5 days. (Patient not taking: Reported on 06/02/2017) 10 mL 0  . cetirizine (ZYRTEC) 10 MG tablet Take 1 tablet (10 mg total) by mouth daily. (Patient not taking: Reported on 08/15/2017) 30 tablet 3   No facility-administered medications prior to visit.     ROS Review of Systems  Constitutional: Negative for activity change and appetite change.  HENT: Negative for sinus pressure and sore throat.   Eyes: Negative for visual disturbance.   Respiratory: Negative for cough, chest tightness and shortness of breath.   Cardiovascular: Negative for chest pain and leg swelling.  Gastrointestinal: Negative for abdominal distention, abdominal pain, constipation and diarrhea.  Endocrine: Negative.   Genitourinary: Negative for dysuria.  Musculoskeletal: Negative for joint swelling and myalgias.  Skin: Negative for rash.  Allergic/Immunologic: Negative.   Neurological: Negative for weakness, light-headedness and numbness.  Psychiatric/Behavioral: Negative for dysphoric mood and suicidal ideas.    Objective:  BP 109/69   Pulse 69   Temp 97.9 F (36.6 C) (Oral)   Wt 208 lb 12.8 oz (94.7 kg)   SpO2 98%   BMI 23.52 kg/m   BP/Weight 08/15/2017 07/18/2017 06/13/2017  Systolic BP 109 136 132  Diastolic BP 69 88 81  Wt. (Lbs) 208.8 - -  BMI 23.52 - -  Some encounter information is confidential and restricted. Go to Review Flowsheets activity to see all data.      Physical Exam  Constitutional: He is oriented to person, place, and time. He appears well-developed and well-nourished.  HENT:  Right Ear: External ear normal.  Left  Ear: External ear normal.  Mouth/Throat: Oropharynx is clear and moist.  Cardiovascular: Normal rate, normal heart sounds and intact distal pulses.  No murmur heard. Pulmonary/Chest: Effort normal and breath sounds normal. He has no wheezes. He has no rales. He exhibits no tenderness.  Abdominal: Soft. Bowel sounds are normal. He exhibits no distension and no mass. There is no tenderness.  Musculoskeletal: Normal range of motion.  Neurological: He is alert and oriented to person, place, and time.     Assessment & Plan:   1. Seasonal allergies - cetirizine (ZYRTEC) 10 MG tablet; Take 1 tablet (10 mg total) by mouth daily.  Dispense: 30 tablet; Refill: 1  2. Cough No indication for antibiotic at this time - guaiFENesin-codeine (CHERATUSSIN AC) 100-10 MG/5ML syrup; Take 5 mLs by mouth 3 (three)  times daily as needed for cough.  Dispense: 120 mL; Refill: 0   Meds ordered this encounter  Medications  . cetirizine (ZYRTEC) 10 MG tablet    Sig: Take 1 tablet (10 mg total) by mouth daily.    Dispense:  30 tablet    Refill:  1  . guaiFENesin-codeine (CHERATUSSIN AC) 100-10 MG/5ML syrup    Sig: Take 5 mLs by mouth 3 (three) times daily as needed for cough.    Dispense:  120 mL    Refill:  0    Follow-up: No follow-ups on file.   Hoy RegisterEnobong Chancelor Hardrick MD

## 2017-08-18 ENCOUNTER — Encounter: Payer: Self-pay | Admitting: Pediatric Intensive Care

## 2017-09-04 ENCOUNTER — Ambulatory Visit: Payer: Self-pay | Admitting: Family Medicine

## 2017-09-06 NOTE — Congregational Nurse Program (Signed)
Congregational Nurse Program Note  Date of Encounter: 08/14/2017  Past Medical History: Past Medical History:  Diagnosis Date  . Achilles tendon rupture-surgery 09/19/15 09/19/2015  . Altered mental status 12/23/2016  . Arthritis    "hips, knees, back" (10/13/2015)  . Bacterial conjunctivitis of right eye 2018  . Bipolar disorder (HCC)   . CAD -S/P PCI LAD/DES 10/13/15 10/14/2015  . CHF (congestive heart failure) (HCC)   . Cocaine abuse-drug sceen positive, History of 10/14/2015  . Coronary artery disease   . Depression   . DVT (deep venous thrombosis) (HCC) 09/2015   right  . Essential hypertension 11/30/2015  . Heart murmur   . High cholesterol   . Hypertension   . Myocardial infarction (HCC) 10/12/2015 X 2-3  . NSTEMI (non-ST elevated myocardial infarction) (HCC)   . Osteoarthritis   . Rectal myiasis   . Schizoaffective disorder, bipolar type (HCC) 10/14/2015  . Schizoaffective disorder, bipolar type (HCC)   . Sciatica     Encounter Details: CNP Questionnaire - 08/14/17 0900      Questionnaire   Patient Status  Not Applicable    Race  Black or African American    Location [facility] Served At  [facility]    Insurance  Not Applicable    Uninsured  Uninsured (Subsequent visits/quarter)    Food  No food insecurities    Housing/Utilities  No permanent housing    Transportation  No transportation needs    Interpersonal Safety  Yes, feel physically and emotionally safe where you currently live    Medication  No medication insecurities    Medical Provider  Yes    Referrals  Primary Care Provider/Clinic    ED Visit Averted  Yes    Life-Saving Intervention Made  Not Applicable      Clinical Intake - 08/15/17 0944      Pre-visit preparation   Pre-visit preparation completed  Yes      Pain   Pain   0-10    Pain Score  6     Pain Location  Back    Pain Orientation  Lower      Nutrition Screen   Diabetes  No      Functional Status   Activities of Daily Living  Independent    Ambulation  Independent    Medication Administration  Independent    Home Management  Independent      Abuse/Neglect   Do you feel unsafe in your current relationship?  No    Do you feel physically threatened by others?  No    Anyone hurting you at home, work, or school?  No    Unable to ask?  No      Web designerLanguage Assistant   Interpreter Needed?  No     BP check. Cleint states that he has had URI symptoms with productive cough for about a week. Thinks he's been running a fevr. BBS- rhonchi and expiratory wheezes bilaterally. CN advised checking in with PCP for evaluation. [facility] Client declined transportation assistance.

## 2017-09-14 ENCOUNTER — Emergency Department (HOSPITAL_COMMUNITY)
Admission: EM | Admit: 2017-09-14 | Discharge: 2017-09-15 | Disposition: A | Discharge status: Home / Self Care | Payer: Self-pay | Attending: Emergency Medicine | Admitting: Emergency Medicine

## 2017-09-14 ENCOUNTER — Emergency Department (HOSPITAL_COMMUNITY): Payer: Self-pay

## 2017-09-14 DIAGNOSIS — R45851 Suicidal ideations: Secondary | ICD-10-CM | POA: Insufficient documentation

## 2017-09-14 DIAGNOSIS — F1410 Cocaine abuse, uncomplicated: Secondary | ICD-10-CM

## 2017-09-14 DIAGNOSIS — F209 Schizophrenia, unspecified: Secondary | ICD-10-CM | POA: Insufficient documentation

## 2017-09-14 DIAGNOSIS — F1420 Cocaine dependence, uncomplicated: Secondary | ICD-10-CM | POA: Insufficient documentation

## 2017-09-14 MED ORDER — NALOXONE HCL 2 MG/2ML IJ SOSY
2.0000 mg | PREFILLED_SYRINGE | Freq: Once | INTRAMUSCULAR | Status: AC
  Administered 2017-09-14: 2 mg via INTRAVENOUS
  Filled 2017-09-14: qty 2

## 2017-09-14 NOTE — ED Notes (Signed)
No effect post Narcan.

## 2017-09-14 NOTE — ED Notes (Signed)
Unable to get information from patient. Will continue to reassess.

## 2017-09-14 NOTE — ED Triage Notes (Signed)
Patient found by EMS, diaphoretic and not responding appropriately. Patient answers questions at random moments. Uncooperative.

## 2017-09-15 ENCOUNTER — Encounter: Payer: Self-pay | Admitting: Family Medicine

## 2017-09-15 ENCOUNTER — Emergency Department (HOSPITAL_COMMUNITY): Payer: Self-pay

## 2017-09-15 ENCOUNTER — Encounter (HOSPITAL_COMMUNITY): Payer: Self-pay | Admitting: Emergency Medicine

## 2017-09-15 LAB — CBC WITH DIFFERENTIAL/PLATELET
Abs Immature Granulocytes: 0 10*3/uL (ref 0.0–0.1)
Basophils Absolute: 0 10*3/uL (ref 0.0–0.1)
Basophils Relative: 1 %
EOS PCT: 0 %
Eosinophils Absolute: 0 10*3/uL (ref 0.0–0.7)
HCT: 42.1 % (ref 39.0–52.0)
HEMOGLOBIN: 13.5 g/dL (ref 13.0–17.0)
Immature Granulocytes: 0 %
LYMPHS ABS: 1.6 10*3/uL (ref 0.7–4.0)
LYMPHS PCT: 28 %
MCH: 28.9 pg (ref 26.0–34.0)
MCHC: 32.1 g/dL (ref 30.0–36.0)
MCV: 90.1 fL (ref 78.0–100.0)
MONO ABS: 0.4 10*3/uL (ref 0.1–1.0)
Monocytes Relative: 7 %
NEUTROS ABS: 3.7 10*3/uL (ref 1.7–7.7)
Neutrophils Relative %: 64 %
Platelets: 195 10*3/uL (ref 150–400)
RBC: 4.67 MIL/uL (ref 4.22–5.81)
RDW: 13.8 % (ref 11.5–15.5)
WBC: 5.7 10*3/uL (ref 4.0–10.5)

## 2017-09-15 LAB — I-STAT CHEM 8, ED
BUN: 15 mg/dL (ref 6–20)
CALCIUM ION: 1.14 mmol/L — AB (ref 1.15–1.40)
CHLORIDE: 106 mmol/L (ref 98–111)
CREATININE: 1.5 mg/dL — AB (ref 0.61–1.24)
Glucose, Bld: 98 mg/dL (ref 70–99)
HCT: 41 % (ref 39.0–52.0)
Hemoglobin: 13.9 g/dL (ref 13.0–17.0)
Potassium: 3.3 mmol/L — ABNORMAL LOW (ref 3.5–5.1)
Sodium: 143 mmol/L (ref 135–145)
TCO2: 25 mmol/L (ref 22–32)

## 2017-09-15 LAB — RAPID URINE DRUG SCREEN, HOSP PERFORMED
Amphetamines: NOT DETECTED
BENZODIAZEPINES: NOT DETECTED
Barbiturates: NOT DETECTED
Cocaine: POSITIVE — AB
OPIATES: NOT DETECTED
TETRAHYDROCANNABINOL: NOT DETECTED

## 2017-09-15 LAB — I-STAT TROPONIN, ED: Troponin i, poc: 0.04 ng/mL (ref 0.00–0.08)

## 2017-09-15 LAB — ETHANOL

## 2017-09-15 MED ORDER — ONDANSETRON HCL 4 MG PO TABS: 4.0000 mg | ORAL_TABLET | Freq: Three times a day (TID) | ORAL | Status: DC | PRN

## 2017-09-15 MED ORDER — ACETAMINOPHEN 325 MG PO TABS: 650.0000 mg | ORAL_TABLET | ORAL | Status: DC | PRN

## 2017-09-15 MED ORDER — ALUM & MAG HYDROXIDE-SIMETH 200-200-20 MG/5ML PO SUSP: 30.0000 mL | Freq: Four times a day (QID) | ORAL | Status: DC | PRN

## 2017-09-15 NOTE — ED Notes (Signed)
Dr.Zacowski at bedside and talking to patient

## 2017-09-15 NOTE — ED Notes (Signed)
Given a meal.  

## 2017-09-15 NOTE — ED Notes (Signed)
Pt in room alert and resting with sitter @ bedside VS documented

## 2017-09-15 NOTE — Congregational Nurse Program (Signed)
Bp check 

## 2017-09-15 NOTE — ED Provider Notes (Signed)
[facility] EMERGENCY DEPARTMENT Provider Note   CSN: [id number] Arrival date & time: 09/14/17  2249     History   Chief Complaint Chief Complaint  Patient presents with  . Altered Mental Status    HPI Minor Iden is a 47 y.o. male.  The history is provided by the patient.  Altered Mental Status   This is a new problem. The current episode started 3 to 5 hours ago. The problem has not changed since onset.Associated symptoms include somnolence. Pertinent negatives include no weakness, no agitation and no hallucinations. Risk factors include illicit drug use. His past medical history does not include diabetes, seizures, COPD, head trauma or heart disease.  Now is awake and endorses SI "just wants to die".  No AH no VH   History reviewed. No pertinent past medical history.  There are no active problems to display for this patient.   History reviewed. No pertinent surgical history.      Home Medications    Prior to Admission medications   Not on File    Family History No family history on file.  Social History Social History   Tobacco Use  . Smoking status: Not on file  Substance Use Topics  . Alcohol use: Not on file  . Drug use: Not on file     Allergies   Patient has no known allergies.   Review of Systems Review of Systems  Constitutional: Negative for diaphoresis and fever.  HENT: Negative for drooling.   Eyes: Negative for photophobia.  Respiratory: Negative for shortness of breath.   Cardiovascular: Negative for chest pain, palpitations and leg swelling.  Gastrointestinal: Negative for abdominal pain, anal bleeding, blood in stool, constipation, diarrhea and nausea.  Genitourinary: Negative for frequency and penile swelling.  Neurological: Negative for speech difficulty, weakness, light-headedness, numbness and headaches.  Psychiatric/Behavioral: Negative for agitation and hallucinations.  All other systems reviewed  and are negative.    Physical Exam Updated Vital Signs BP (!) 140/98   Pulse 72   Temp (!) 97.3 F (36.3 C) (Axillary)   Resp 15   SpO2 99%   Physical Exam  Constitutional: He is oriented to person, place, and time. He appears well-developed and well-nourished. No distress.  HENT:  Head: Normocephalic and atraumatic.  Mouth/Throat: No oropharyngeal exudate.  Eyes: Pupils are equal, round, and reactive to light. Conjunctivae are normal.  Neck: Normal range of motion. Neck supple.  Cardiovascular: Normal rate, regular rhythm, normal heart sounds and intact distal pulses.  Pulmonary/Chest: Effort normal and breath sounds normal. No stridor. He has no wheezes. He has no rales.  Abdominal: Soft. Bowel sounds are normal. He exhibits no mass. There is no tenderness. There is no rebound and no guarding.  Musculoskeletal: Normal range of motion. He exhibits no edema.  Neurological: He is alert and oriented to person, place, and time. He displays normal reflexes. He exhibits normal muscle tone. Coordination normal.  Skin: Skin is warm and dry. Capillary refill takes less than 2 seconds.     ED Treatments / Results  Labs (all labs ordered are listed, but only abnormal results are displayed) Results for orders placed or performed during the hospital encounter of 09/14/17  CBC with Differential/Platelet  Result Value Ref Range   WBC 5.7 4.0 - 10.5 K/uL   RBC 4.67 4.22 - 5.81 MIL/uL   Hemoglobin 13.5 13.0 - 17.0 g/dL   HCT 40.9 81.1 - 91.4 %   MCV 90.1 78.0 - 100.0  fL   MCH 28.9 26.0 - 34.0 pg   MCHC 32.1 30.0 - 36.0 g/dL   RDW 08.613.8 57.811.5 - 46.915.5 %   Platelets 195 150 - 400 K/uL   Neutrophils Relative % 64 %   Neutro Abs 3.7 1.7 - 7.7 K/uL   Lymphocytes Relative 28 %   Lymphs Abs 1.6 0.7 - 4.0 K/uL   Monocytes Relative 7 %   Monocytes Absolute 0.4 0.1 - 1.0 K/uL   Eosinophils Relative 0 %   Eosinophils Absolute 0.0 0.0 - 0.7 K/uL   Basophils Relative 1 %   Basophils Absolute 0.0  0.0 - 0.1 K/uL   Immature Granulocytes 0 %   Abs Immature Granulocytes 0.0 0.0 - 0.1 K/uL  Rapid urine drug screen (hospital performed)  Result Value Ref Range   Opiates NONE DETECTED NONE DETECTED   Cocaine POSITIVE (A) NONE DETECTED   Benzodiazepines NONE DETECTED NONE DETECTED   Amphetamines NONE DETECTED NONE DETECTED   Tetrahydrocannabinol NONE DETECTED NONE DETECTED   Barbiturates NONE DETECTED NONE DETECTED  I-Stat Chem 8, ED  Result Value Ref Range   Sodium 143 135 - 145 mmol/L   Potassium 3.3 (L) 3.5 - 5.1 mmol/L   Chloride 106 98 - 111 mmol/L   BUN 15 6 - 20 mg/dL   Creatinine, Ser 6.291.50 (H) 0.61 - 1.24 mg/dL   Glucose, Bld 98 70 - 99 mg/dL   Calcium, Ion 5.281.14 (L) 1.15 - 1.40 mmol/L   TCO2 25 22 - 32 mmol/L   Hemoglobin 13.9 13.0 - 17.0 g/dL   HCT 41.341.0 24.439.0 - 01.052.0 %  I-Stat Troponin, ED (not [facility])  Result Value Ref Range   Troponin i, poc 0.04 0.00 - 0.08 ng/mL   Comment 3           Ct Head Wo Contrast  Result Date: 09/15/2017 CLINICAL DATA:  Initial evaluation for acute altered mental status. EXAM: CT HEAD WITHOUT CONTRAST TECHNIQUE: Contiguous axial images were obtained from the base of the skull through the vertex without intravenous contrast. COMPARISON:  None. FINDINGS: Brain: Cerebral volume within normal limits for patient age. No evidence for acute intracranial hemorrhage. No findings to suggest acute large vessel territory infarct. No mass lesion, midline shift, or mass effect. Ventricles are normal in size without evidence for hydrocephalus. No extra-axial fluid collection identified. Vascular: No hyperdense vessel identified. Skull: Scalp soft tissues demonstrate no acute abnormality. Calvarium intact. Sinuses/Orbits: Globes and orbital soft tissues within normal limits. Left maxillary sinus retention cyst. Visualized paranasal sinuses are otherwise clear. No mastoid effusion. IMPRESSION: Normal head CT.  No acute intracranial abnormality. Electronically Signed    By: Rise MuBenjamin  McClintock M.D.   On: 09/15/2017 00:21   Dg Chest Portable 1 View  Result Date: 09/14/2017 CLINICAL DATA:  Altered mental status EXAM: PORTABLE CHEST 1 VIEW COMPARISON:  None. FINDINGS: The heart size and mediastinal contours are within normal limits. Both lungs are clear. The visualized skeletal structures are unremarkable. IMPRESSION: No active disease. Electronically Signed   By: Jasmine PangKim  Fujinaga M.D.   On: 09/14/2017 23:45    EKG None  Radiology Ct Head Wo Contrast  Result Date: 09/15/2017 CLINICAL DATA:  Initial evaluation for acute altered mental status. EXAM: CT HEAD WITHOUT CONTRAST TECHNIQUE: Contiguous axial images were obtained from the base of the skull through the vertex without intravenous contrast. COMPARISON:  None. FINDINGS: Brain: Cerebral volume within normal limits for patient age. No evidence for acute intracranial hemorrhage. No findings to suggest  acute large vessel territory infarct. No mass lesion, midline shift, or mass effect. Ventricles are normal in size without evidence for hydrocephalus. No extra-axial fluid collection identified. Vascular: No hyperdense vessel identified. Skull: Scalp soft tissues demonstrate no acute abnormality. Calvarium intact. Sinuses/Orbits: Globes and orbital soft tissues within normal limits. Left maxillary sinus retention cyst. Visualized paranasal sinuses are otherwise clear. No mastoid effusion. IMPRESSION: Normal head CT.  No acute intracranial abnormality. Electronically Signed   By: Rise MuBenjamin  McClintock M.D.   On: 09/15/2017 00:21   Dg Chest Portable 1 View  Result Date: 09/14/2017 CLINICAL DATA:  Altered mental status EXAM: PORTABLE CHEST 1 VIEW COMPARISON:  None. FINDINGS: The heart size and mediastinal contours are within normal limits. Both lungs are clear. The visualized skeletal structures are unremarkable. IMPRESSION: No active disease. Electronically Signed   By: Jasmine PangKim  Fujinaga M.D.   On: 09/14/2017 23:45     Procedures Procedures (including critical care time)  Medications Ordered in ED Medications  acetaminophen (TYLENOL) tablet 650 mg (has no administration in time range)  ondansetron (ZOFRAN) tablet 4 mg (has no administration in time range)  alum & mag hydroxide-simeth (MAALOX/MYLANTA) 200-200-20 MG/5ML suspension 30 mL (has no administration in time range)  naloxone [facility](NARCAN) injection 2 mg (2 mg Intravenous Given 09/14/17 2341)      Final Clinical Impressions(s) / ED Diagnoses   Medically cleared by me for psychiatry dispo per that service holding orders placed    Demi Trieu, MD 09/15/17 [id number]

## 2017-09-15 NOTE — ED Notes (Signed)
IV removed, local dressing applied. PT given contents from locker and jeans and grey polo that were drying off on counter.

## 2017-09-15 NOTE — ED Notes (Signed)
Pt sleeping at this time , will get vital signs when he wakes back up

## 2017-09-15 NOTE — ED Provider Notes (Signed)
Patient originally assessed by behavioral health and was deemed for inpatient care.  Patient then later decided he wanted to go home.  We had behavioral health reassess him.  They have now cleared him for discharge home.  Patient was denying suicidal ideation.  Apparently he originally stated that he wanted to die but then he later denied that.  He has been cleared by behavioral health.     Vanetta MuldersZackowski, Wilsie Kern, MD 09/15/17 1214

## 2017-09-15 NOTE — ED Notes (Signed)
Pt expresses that he has chronic pain and would like to go home to "take care of some things." PT states, "I don't have time for this, take my vitals and let me leave."  [facility] made aware of wish to leave ED. PT is voluntary at this time, but NP recommended IP placement. EDP also made aware and will come talk to PT. PT is agreeable with the plan to [facility]. Agrees to stay at this time.

## 2017-09-15 NOTE — BH Assessment (Signed)
Tele Assessment Note   Patient Name: Brett Salinas MRN: [id number] Referring Physician: Dr. Cy Blamer Location of Patient: [facility] Location of Provider: Behavioral Health TTS Department  Brett Salinas is an 47 y.o. male.  Patient grunts and groans throughout assessment.  He complains of pain throughout.  He is a poor historian.  Pt cannot recall where he was when EMS picked him up and brought him to [facility].    Patient says he uses multiple drugs.  He is unclear about whether he is on any prescription drugs.  Likely he is not compliant with prescriptions.  Patient cannot say what his prescriptions are.  Patient says "I want to die, I am tired of living."  He does not state a specific plan.  He then talks about having died and come back from the dead.  He says he ha a being in his body named "Primo."  This being talks to him and tells him ugly things.  He says it shows him things but patient cannot say what these things are.  Patient denies any HI.  Patient does mention mental health court but is unclear about it.  He does say he has a court date on 09/21/17.  Patient says he was at [facility] in April, 2019.  -Clinician discussed patient care with Nira Conn, FNP.  He recommended inpatient psychiatric care.  TTS to seek placement.  Diagnosis: F20.9 Schizophrenia; F14.20 Cocaine use d/o severe  Past Medical History: No past medical history on file.    Family History: No family history on file.  Social History:  has no tobacco, alcohol, and drug history on file.  Additional Social History:  Alcohol / Drug Use Pain Medications: Unknown Prescriptions: Has prescriptions but does not take them. Over the Counter: None History of alcohol / drug use?: Yes Substance #1 Name of Substance 1: Cocaine 1 - Age of First Use: Unknown 1 - Amount (size/oz): Varies 1 - Frequency: Varies 1 - Duration: on-going 1 - Last Use / Amount: 08/16  CIWA: CIWA-Ar BP: (!) 140/98 Pulse Rate:  72 COWS:    Allergies: No Known Allergies  Home Medications:  (Not in a hospital admission)  OB/GYN Status:  No LMP for male patient.  General Assessment Data Location of Assessment: [facility] ED TTS Assessment: In system Is this a Tele or Face-to-Face Assessment?: Tele Assessment Is this an Initial Assessment or a Re-assessment for this encounter?: Initial Assessment Marital status: Single Is patient pregnant?: No Pregnancy Status: No Living Arrangements: Other (Comment)(Homeless) Can pt return to current living arrangement?: Yes Admission Status: Voluntary Is patient capable of signing voluntary admission?: Yes Referral Source: Other(Someone called EMS for patient.) Insurance type: self pay     Crisis Care Plan Living Arrangements: Other (Comment)(Homeless) Name of Psychiatrist: [facility] Mixer (can't recall when he was there last) Name of Therapist: [facility]  Education Status Is patient currently in school?: No Is the patient employed, unemployed or receiving disability?: Unemployed  Risk to self with the past 6 months Suicidal Ideation: Yes-Currently Present Has patient been a risk to self within the past 6 months prior to admission? : Yes Suicidal Intent: Yes-Currently Present Has patient had any suicidal intent within the past 6 months prior to admission? : Yes Is patient at risk for suicide?: Yes Suicidal Plan?: No Has patient had any suicidal plan within the past 6 months prior to admission? : Yes Access to Means: Yes Specify Access to Suicidal Means: Multiple means What has been your use of drugs/alcohol within  the last 12 months?: Polysubstance abuse Previous Attempts/Gestures: Yes How many times?: (Multiple ) Other Self Harm Risks: None Triggers for Past Attempts: Hallucinations Intentional Self Injurious Behavior: None Family Suicide History: No Recent stressful life event(s): Financial Problems, Legal Issues(Mental health court) Persecutory voices/beliefs?:  Yes Depression: Yes Depression Symptoms: Despondent, Tearfulness, Feeling angry/irritable, Feeling worthless/self pity Substance abuse history and/or treatment for substance abuse?: Yes Suicide prevention information given to non-admitted patients: Not applicable  Risk to Others within the past 6 months Homicidal Ideation: No Does patient have any lifetime risk of violence toward others beyond the six months prior to admission? : No Thoughts of Harm to Others: No Current Homicidal Intent: No Current Homicidal Plan: No Access to Homicidal Means: No Identified Victim: No one History of harm to others?: No Assessment of Violence: In distant past Violent Behavior Description: "I don't fight people" Does patient have access to weapons?: No Criminal Charges Pending?: Yes Describe Pending Criminal Charges: Drug possession Does patient have a court date: Yes Court Date: 09/21/17 Is patient on probation?: Yes  Psychosis Hallucinations: Auditory, Visual("Primo talks to me" Seeing things) Delusions: Persecutory(A being called Primo controls him.)  Mental Status Report Appearance/Hygiene: Body odor, Disheveled, In scrubs Eye Contact: Poor Motor Activity: Freedom of movement, Restlessness Speech: Incoherent Level of Consciousness: Restless Mood: Depressed, Anxious, Despair, Sad Affect: Anxious, Depressed Anxiety Level: Severe Thought Processes: Irrelevant Judgement: Impaired Orientation: Appropriate for developmental age Obsessive Compulsive Thoughts/Behaviors: Minimal  Cognitive Functioning Concentration: Decreased Memory: Recent Impaired, Remote Impaired Is patient IDD: No Is patient DD?: Yes Insight: Poor Impulse Control: Poor Appetite: Poor Have you had any weight changes? : No Change Sleep: Decreased Total Hours of Sleep: (<4H/D) Vegetative Symptoms: Decreased grooming, Not bathing  ADLScreening [facility] Assessment Services) Patient's cognitive ability adequate to safely  complete daily activities?: Yes Patient able to express need for assistance with ADLs?: Yes Independently performs ADLs?: Yes (appropriate for developmental age)  Prior Inpatient Therapy Prior Inpatient Therapy: Yes Prior Therapy Dates: April 2019 [facility] Reason for Treatment: SI, SA  Prior Outpatient Therapy Prior Outpatient Therapy: Yes Prior Therapy Dates: Currently Prior Therapy Facilty/Provider(s): [facility] Reason for Treatment: med management Does patient have an ACCT team?: No Does patient have Intensive In-House Services?  : No Does patient have [facility] services? : Yes Does patient have P4CC services?: No  ADL Screening (condition at time of admission) Patient's cognitive ability adequate to safely complete daily activities?: Yes Is the patient deaf or have difficulty hearing?: No Does the patient have difficulty seeing, even when wearing glasses/contacts?: No Does the patient have difficulty concentrating, remembering, or making decisions?: Yes Patient able to express need for assistance with ADLs?: Yes Does the patient have difficulty dressing or bathing?: No Independently performs ADLs?: Yes (appropriate for developmental age) Does the patient have difficulty walking or climbing stairs?: No Weakness of Legs: None Weakness of Arms/Hands: None       Abuse/Neglect Assessment (Assessment to be complete while patient is alone) Abuse/Neglect Assessment Can Be Completed: Yes Physical Abuse: Denies Verbal Abuse: Denies Sexual Abuse: Denies Exploitation of patient/patient's resources: Denies Self-Neglect: Denies     Merchant navy officerAdvance Directives (For Healthcare) Does Patient Have a Medical Advance Directive?: No Would patient like information on creating a medical advance directive?: No - Patient declined          Disposition:  Disposition Initial Assessment Completed for this Encounter: Yes Patient referred to: Other (Comment)(Pt to be  reviewed by FNP)  This service was provided via telemedicine  using a 2-way, interactive audio and Immunologistvideo technology.  Names of all persons participating in this telemedicine service and their role in this encounter. Name: Assunta Gambleserrick Hines Role: patient  Name: Brett Salinas, M.S. LCAS QP Role: clinician  Name:  Role:   Name:  Role:     Alexandria LodgeHarvey, Santiago Stenzel Ray 09/15/2017 5:34 AM

## 2017-09-15 NOTE — ED Notes (Signed)
Pt ready for discharge VS documented 

## 2017-09-15 NOTE — ED Notes (Signed)
[facility] made aware that PT is in a room and can be teleconferenced as needed

## 2017-09-15 NOTE — ED Notes (Signed)
Pt changed into paper scrubs, witnessed by this NT. Belongings placed into bags.

## 2017-09-15 NOTE — ED Notes (Signed)
Patient continues to answer the questions he chooses to answer. Remains uncooperative.

## 2017-09-15 NOTE — Progress Notes (Signed)
Patient is seen by me via tele-psych and I have consulted with Dr. Lucianne MussKumar.  Patient denies any SI/HI/AVH and contracts for safety.  Patient states that he uses illicit substances frequently and sometimes intentionally laces his drugs when he uses them.  He states that between the combination of the cocaine and his living situation he did make some comments about not wanting to live anymore.  Patient states that he does have a place to stay until Monday and then on Monday he is moving to another place that he will be happier at.  He states that he feels he is ready to go so that he can take care of his moving arrangements throughout the weekend.  Patient continues to deny any SI/HI/AVH and states that he is ready to be discharged.  Patient does not meet inpatient criteria and is psychiatrically cleared.  I have contacted Dr. Deretha EmoryZackowski and notified him of our recommendations.

## 2017-09-15 NOTE — Discharge Instructions (Addendum)
Cleared by behavioral health for discharge home.  Follow-up as per behavioral health. 

## 2017-10-09 ENCOUNTER — Other Ambulatory Visit: Payer: Self-pay | Admitting: Family Medicine

## 2017-10-09 DIAGNOSIS — J302 Other seasonal allergic rhinitis: Secondary | ICD-10-CM

## 2017-10-10 ENCOUNTER — Other Ambulatory Visit: Payer: Self-pay | Admitting: Family Medicine

## 2017-10-10 MED ORDER — ADULT MULTIVITAMIN W/MINERALS CH: 1.0000 | ORAL_TABLET | Freq: Every day | ORAL | 0 refills | Status: DC

## 2017-11-29 MED FILL — FLUTICASONE PROP 50 MCG SPR: 50 | 30 days supply | Qty: 16 | Fill #0

## 2017-11-29 MED FILL — OLOPATADINE HCL 0.1% EYE DR: 0.1 | 18 days supply | Qty: 5 | Fill #0

## 2017-11-30 MED FILL — DICLOFENAC SODIUM 1% GEL: 1 | 16 days supply | Qty: 100 | Fill #2

## 2017-11-30 MED FILL — ?ATORVASTATIN 20 MG TABLET: 20 | 30 days supply | Qty: 30 | Fill #1

## 2017-11-30 MED FILL — ?CETIRIZINE HCL 10 MG TABLE: 10 | 30 days supply | Qty: 30 | Fill #1

## 2017-11-30 MED FILL — FLUoxetine HCL 20 MG CAPS: 20 | 30 days supply | Qty: 30 | Fill #2

## 2017-12-08 MED FILL — $VENTOLIN HFA 18G INHALER: 108 (90 BAS | 75 days supply | Qty: 54 | Fill #2

## 2017-12-12 ENCOUNTER — Encounter: Payer: Self-pay | Admitting: Pediatric Intensive Care

## 2017-12-20 ENCOUNTER — Ambulatory Visit: Payer: Self-pay | Admitting: Nurse Practitioner

## 2017-12-25 ENCOUNTER — Encounter: Payer: Self-pay | Admitting: Pediatric Intensive Care

## 2018-01-09 NOTE — Congregational Nurse Program (Signed)
BP check. Client was a no-show for his last PCP appointment. CN discussed no-shows as potentially endangering client's ability to access care at clinic. Client states he understands this.

## 2018-01-09 NOTE — Congregational Nurse Program (Signed)
BP check. Client states he thinks he may have had STI exposure. Asking for resources for testing. CN advised THP or return to PCP. [facility]

## 2018-01-16 ENCOUNTER — Other Ambulatory Visit: Payer: Self-pay

## 2018-01-16 ENCOUNTER — Emergency Department (HOSPITAL_COMMUNITY)
Admission: EM | Admit: 2018-01-16 | Discharge: 2018-01-16 | Disposition: A | Discharge status: Home / Self Care | Payer: Self-pay | Attending: Emergency Medicine | Admitting: Emergency Medicine

## 2018-01-16 DIAGNOSIS — R05 Cough: Secondary | ICD-10-CM | POA: Insufficient documentation

## 2018-01-16 DIAGNOSIS — Z5321 Procedure and treatment not carried out due to patient leaving prior to being seen by health care provider: Secondary | ICD-10-CM | POA: Insufficient documentation

## 2018-01-16 NOTE — ED Triage Notes (Signed)
Pt called for triage, pacing in lobby stating "I am an empath, Im absorbing all the sickness from everyone" Pt also reports that "this is an elaborate set up".

## 2018-01-16 NOTE — ED Notes (Signed)
Pt left triage, refusing blood. Attempting to record staff, explained to patient that this was not allowed. Tried to get him to speak with ED MD and charge RN  but pt refused. [employer] and security at bedside. Pt states that he can not stay here and walked out of triage.

## 2018-03-31 IMAGING — CT CT ANGIO CHEST PE W OR WO CONTRAST
1 of 7 series · 1 of 36 positions shown · IV contrast (Iodine)
Comparison: Chest radiograph 10/12/2015

CLINICAL DATA: Recent orthopedic surgery. Leg pain, chest pain and
shortness of breath.

EXAM:
CT ANGIOGRAPHY CHEST WITH CONTRAST
TECHNIQUE: Multidetector CT imaging of the chest was performed using the
standard protocol during bolus administration of intravenous
contrast. Multiplanar CT image reconstructions and MIPs were
obtained to evaluate the vascular anatomy.
CONTRAST:  100 mL Isovue 370 IV

[Series 200: locator · axial · 0.49mm/px · 1 of 1 slices shown]
[im 1/1  lung]
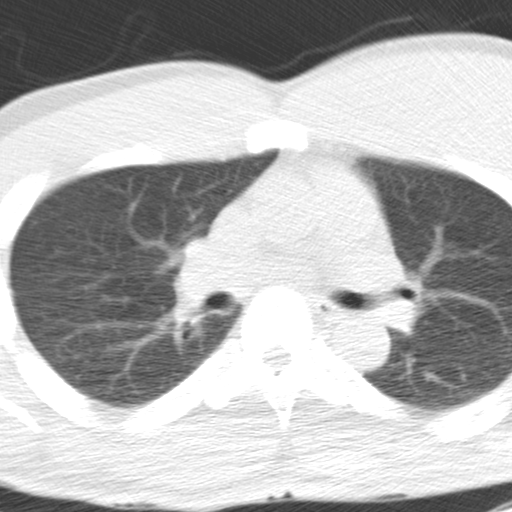

[1 of 36 positions shown; findings below may reference images not displayed]

FINDINGS: Vascular: No pulmonary embolus. The main pulmonary artery is within
normal limits for size. No CT evidence of acute right heart strain.
Visualized portion of the aorta is normal. Heart size is normal.

Mediastinum/Nodes: No mediastinal or axillary adenopathy.

Lungs: No pulmonary nodules or masses. No pleural effusion or focal
consolidation. Calcified granuloma in the left lower lobe.

Visualized abdomen: Contrast bolus timing is not optimized for
evaluation of the abdominal organs. There is a hypo attenuating
focus within the right hepatic lobe measuring 3.0 x 2.3 centimeters
(series 401, image 154). Visualized upper abdominal structures are
otherwise unremarkable.

Musculoskeletal: No lytic or blastic osseous lesions. The visualized
extrathoracic soft tissues are normal.
IMPRESSION: 1. No pulmonary embolus.
2. Clear lungs.
3. Hypoattenuating lesion within the right hepatic lobe,
incompletely evaluated on this study. This may be a benign lesion
such as a hemangioma. Abdominal MRI with and without contrast could
be performed for more complete characterization.

## 2018-09-25 ENCOUNTER — Inpatient Hospital Stay (HOSPITAL_COMMUNITY)
Admission: EM | Admit: 2018-09-25 | Discharge: 2018-09-27 | DRG: 917 | Disposition: A | Discharge status: Home / Self Care | Payer: Self-pay | Attending: Internal Medicine | Admitting: Internal Medicine

## 2018-09-25 ENCOUNTER — Emergency Department (HOSPITAL_COMMUNITY): Payer: Self-pay

## 2018-09-25 ENCOUNTER — Inpatient Hospital Stay (HOSPITAL_COMMUNITY): Payer: Self-pay

## 2018-09-25 DIAGNOSIS — F1910 Other psychoactive substance abuse, uncomplicated: Secondary | ICD-10-CM

## 2018-09-25 DIAGNOSIS — Z86718 Personal history of other venous thrombosis and embolism: Secondary | ICD-10-CM

## 2018-09-25 DIAGNOSIS — Z915 Personal history of self-harm: Secondary | ICD-10-CM

## 2018-09-25 DIAGNOSIS — Z79899 Other long term (current) drug therapy: Secondary | ICD-10-CM

## 2018-09-25 DIAGNOSIS — Z9114 Patient's other noncompliance with medication regimen: Secondary | ICD-10-CM

## 2018-09-25 DIAGNOSIS — I110 Hypertensive heart disease with heart failure: Secondary | ICD-10-CM | POA: Diagnosis present

## 2018-09-25 DIAGNOSIS — Z20828 Contact with and (suspected) exposure to other viral communicable diseases: Secondary | ICD-10-CM | POA: Diagnosis present

## 2018-09-25 DIAGNOSIS — I2510 Atherosclerotic heart disease of native coronary artery without angina pectoris: Secondary | ICD-10-CM | POA: Diagnosis present

## 2018-09-25 DIAGNOSIS — F1410 Cocaine abuse, uncomplicated: Secondary | ICD-10-CM | POA: Diagnosis present

## 2018-09-25 DIAGNOSIS — E860 Dehydration: Secondary | ICD-10-CM | POA: Diagnosis present

## 2018-09-25 DIAGNOSIS — T50904A Poisoning by unspecified drugs, medicaments and biological substances, undetermined, initial encounter: Principal | ICD-10-CM | POA: Diagnosis present

## 2018-09-25 DIAGNOSIS — N179 Acute kidney failure, unspecified: Secondary | ICD-10-CM | POA: Diagnosis present

## 2018-09-25 DIAGNOSIS — F250 Schizoaffective disorder, bipolar type: Secondary | ICD-10-CM | POA: Diagnosis present

## 2018-09-25 DIAGNOSIS — M1990 Unspecified osteoarthritis, unspecified site: Secondary | ICD-10-CM | POA: Diagnosis present

## 2018-09-25 DIAGNOSIS — Z7982 Long term (current) use of aspirin: Secondary | ICD-10-CM

## 2018-09-25 DIAGNOSIS — R778 Other specified abnormalities of plasma proteins: Secondary | ICD-10-CM

## 2018-09-25 DIAGNOSIS — G92 Toxic encephalopathy: Secondary | ICD-10-CM | POA: Diagnosis present

## 2018-09-25 DIAGNOSIS — I5032 Chronic diastolic (congestive) heart failure: Secondary | ICD-10-CM | POA: Diagnosis present

## 2018-09-25 DIAGNOSIS — I248 Other forms of acute ischemic heart disease: Secondary | ICD-10-CM | POA: Diagnosis present

## 2018-09-25 DIAGNOSIS — E785 Hyperlipidemia, unspecified: Secondary | ICD-10-CM | POA: Diagnosis present

## 2018-09-25 DIAGNOSIS — M6282 Rhabdomyolysis: Secondary | ICD-10-CM | POA: Diagnosis present

## 2018-09-25 DIAGNOSIS — I252 Old myocardial infarction: Secondary | ICD-10-CM

## 2018-09-25 DIAGNOSIS — E7800 Pure hypercholesterolemia, unspecified: Secondary | ICD-10-CM | POA: Diagnosis present

## 2018-09-25 DIAGNOSIS — Z955 Presence of coronary angioplasty implant and graft: Secondary | ICD-10-CM

## 2018-09-25 DIAGNOSIS — R45851 Suicidal ideations: Secondary | ICD-10-CM | POA: Diagnosis present

## 2018-09-25 LAB — CBC WITH DIFFERENTIAL/PLATELET
Abs Immature Granulocytes: 0.03 10*3/uL (ref 0.00–0.07)
Basophils Absolute: 0 10*3/uL (ref 0.0–0.1)
Basophils Relative: 0 %
Eosinophils Absolute: 0 10*3/uL (ref 0.0–0.5)
Eosinophils Relative: 0 %
HCT: 43.2 % (ref 39.0–52.0)
Hemoglobin: 14.2 g/dL (ref 13.0–17.0)
Immature Granulocytes: 0 %
Lymphocytes Relative: 13 %
Lymphs Abs: 1.3 10*3/uL (ref 0.7–4.0)
MCH: 29 pg (ref 26.0–34.0)
MCHC: 32.9 g/dL (ref 30.0–36.0)
MCV: 88.3 fL (ref 80.0–100.0)
Monocytes Absolute: 0.5 10*3/uL (ref 0.1–1.0)
Monocytes Relative: 5 %
Neutro Abs: 7.7 10*3/uL (ref 1.7–7.7)
Neutrophils Relative %: 82 %
Platelets: 230 10*3/uL (ref 150–400)
RBC: 4.89 MIL/uL (ref 4.22–5.81)
RDW: 15 % (ref 11.5–15.5)
WBC: 9.6 10*3/uL (ref 4.0–10.5)
nRBC: 0 % (ref 0.0–0.2)

## 2018-09-25 LAB — URINALYSIS, ROUTINE W REFLEX MICROSCOPIC
Bilirubin Urine: NEGATIVE
Glucose, UA: NEGATIVE mg/dL
Ketones, ur: 5 mg/dL — AB
Leukocytes,Ua: NEGATIVE
Nitrite: NEGATIVE
Protein, ur: 30 mg/dL — AB
Specific Gravity, Urine: 1.02 (ref 1.005–1.030)
pH: 5 (ref 5.0–8.0)

## 2018-09-25 LAB — POCT I-STAT EG7
Acid-base deficit: 5 mmol/L — ABNORMAL HIGH (ref 0.0–2.0)
Bicarbonate: 20.2 mmol/L (ref 20.0–28.0)
Calcium, Ion: 1.14 mmol/L — ABNORMAL LOW (ref 1.15–1.40)
HCT: 44 % (ref 39.0–52.0)
Hemoglobin: 15 g/dL (ref 13.0–17.0)
O2 Saturation: 99 %
Potassium: 4.6 mmol/L (ref 3.5–5.1)
Sodium: 141 mmol/L (ref 135–145)
TCO2: 21 mmol/L — ABNORMAL LOW (ref 22–32)
pCO2, Ven: 37.4 mmHg — ABNORMAL LOW (ref 44.0–60.0)
pH, Ven: 7.34 (ref 7.250–7.430)
pO2, Ven: 171 mmHg — ABNORMAL HIGH (ref 32.0–45.0)

## 2018-09-25 LAB — COMPREHENSIVE METABOLIC PANEL WITH GFR
ALT: 36 U/L (ref 0–44)
AST: 170 U/L — ABNORMAL HIGH (ref 15–41)
Albumin: 4.5 g/dL (ref 3.5–5.0)
Alkaline Phosphatase: 55 U/L (ref 38–126)
Anion gap: 16 — ABNORMAL HIGH (ref 5–15)
BUN: 45 mg/dL — ABNORMAL HIGH (ref 6–20)
CO2: 19 mmol/L — ABNORMAL LOW (ref 22–32)
Calcium: 9.4 mg/dL (ref 8.9–10.3)
Chloride: 106 mmol/L (ref 98–111)
Creatinine, Ser: 3.4 mg/dL — ABNORMAL HIGH (ref 0.61–1.24)
GFR calc Af Amer: 23 mL/min — ABNORMAL LOW
GFR calc non Af Amer: 20 mL/min — ABNORMAL LOW
Glucose, Bld: 126 mg/dL — ABNORMAL HIGH (ref 70–99)
Potassium: 4.6 mmol/L (ref 3.5–5.1)
Sodium: 141 mmol/L (ref 135–145)
Total Bilirubin: 1.4 mg/dL — ABNORMAL HIGH (ref 0.3–1.2)
Total Protein: 7.2 g/dL (ref 6.5–8.1)

## 2018-09-25 LAB — CBG MONITORING, ED: Glucose-Capillary: 121 mg/dL — ABNORMAL HIGH (ref 70–99)

## 2018-09-25 LAB — MAGNESIUM: Magnesium: 2.6 mg/dL — ABNORMAL HIGH (ref 1.7–2.4)

## 2018-09-25 LAB — RAPID URINE DRUG SCREEN, HOSP PERFORMED
Amphetamines: NOT DETECTED
Barbiturates: NOT DETECTED
Benzodiazepines: POSITIVE — AB
Cocaine: POSITIVE — AB
Opiates: NOT DETECTED
Tetrahydrocannabinol: NOT DETECTED

## 2018-09-25 LAB — TROPONIN I (HIGH SENSITIVITY)
Troponin I (High Sensitivity): 248 ng/L
Troponin I (High Sensitivity): 247 ng/L (ref <18)

## 2018-09-25 LAB — SALICYLATE LEVEL: Salicylate Lvl: <7 mg/dL (ref 2.8–30.0)

## 2018-09-25 LAB — CK: Total CK: 7827 U/L — ABNORMAL HIGH (ref 49–397)

## 2018-09-25 LAB — SARS CORONAVIRUS 2 (TAT 6-24 HRS): SARS Coronavirus 2: NEGATIVE

## 2018-09-25 LAB — ETHANOL: Alcohol, Ethyl (B): <10 mg/dL

## 2018-09-25 LAB — ACETAMINOPHEN LEVEL: Acetaminophen (Tylenol), Serum: <10 ug/mL — ABNORMAL LOW (ref 10–30)

## 2018-09-25 LAB — PHOSPHORUS: Phosphorus: 4.4 mg/dL (ref 2.5–4.6)

## 2018-09-25 MED ORDER — ASPIRIN EC 81 MG PO TBEC
81.0000 mg | DELAYED_RELEASE_TABLET | Freq: Every day | ORAL | Status: DC
  Administered 2018-09-26 – 2018-09-27 (×2): 81 mg via ORAL
  Filled 2018-09-25 (×2): qty 1

## 2018-09-25 MED ORDER — HEPARIN SODIUM (PORCINE) 5000 UNIT/ML IJ SOLN: 5000.0000 [IU] | Freq: Three times a day (TID) | INTRAMUSCULAR | Status: DC

## 2018-09-25 MED ORDER — THIAMINE HCL 100 MG/ML IJ SOLN: 100.0000 mg | Freq: Every day | INTRAMUSCULAR | Status: DC

## 2018-09-25 MED ORDER — ADULT MULTIVITAMIN W/MINERALS CH
1.0000 | ORAL_TABLET | Freq: Every day | ORAL | Status: DC
  Administered 2018-09-26 – 2018-09-27 (×2): 1 via ORAL
  Filled 2018-09-25 (×2): qty 1

## 2018-09-25 MED ORDER — FONDAPARINUX SODIUM 2.5 MG/0.5ML ~~LOC~~ SOLN
2.5000 mg | SUBCUTANEOUS | Status: DC
  Administered 2018-09-26 (×2): 2.5 mg via SUBCUTANEOUS
  Filled 2018-09-25 (×2): qty 0.5

## 2018-09-25 MED ORDER — FOLIC ACID 1 MG PO TABS
1.0000 mg | ORAL_TABLET | Freq: Every day | ORAL | Status: DC
  Administered 2018-09-26 – 2018-09-27 (×2): 1 mg via ORAL
  Filled 2018-09-25 (×2): qty 1

## 2018-09-25 MED ORDER — VITAMIN B-1 100 MG PO TABS
100.0000 mg | ORAL_TABLET | Freq: Every day | ORAL | Status: DC
  Administered 2018-09-26 – 2018-09-27 (×2): 100 mg via ORAL
  Filled 2018-09-25 (×2): qty 1

## 2018-09-25 MED ORDER — METOPROLOL SUCCINATE ER 25 MG PO TB24
12.5000 mg | ORAL_TABLET | Freq: Every day | ORAL | Status: DC
  Administered 2018-09-26: 12.5 mg via ORAL
  Filled 2018-09-25: qty 1

## 2018-09-25 MED ORDER — DEXTROSE 5 % IV SOLN
Freq: Once | INTRAVENOUS | Status: AC
  Administered 2018-09-25: 21:00:00 via INTRAVENOUS

## 2018-09-25 MED ORDER — LORAZEPAM 1 MG PO TABS: 1.0000 mg | ORAL_TABLET | Freq: Four times a day (QID) | ORAL | Status: DC | PRN

## 2018-09-25 MED ORDER — SODIUM CHLORIDE 0.9 % IV BOLUS
1000.0000 mL | Freq: Once | INTRAVENOUS | Status: AC
  Administered 2018-09-25: 1000 mL via INTRAVENOUS

## 2018-09-25 MED ORDER — SODIUM BICARBONATE-DEXTROSE 150-5 MEQ/L-% IV SOLN
150.0000 meq | INTRAVENOUS | Status: DC
  Administered 2018-09-26 (×2): 150 meq via INTRAVENOUS
  Filled 2018-09-25 (×4): qty 1000

## 2018-09-25 MED ORDER — OLOPATADINE HCL 0.1 % OP SOLN
1.0000 [drp] | Freq: Two times a day (BID) | OPHTHALMIC | Status: DC
  Administered 2018-09-26 (×2): 1 [drp] via OPHTHALMIC
  Filled 2018-09-25 (×2): qty 5

## 2018-09-25 MED ORDER — LORAZEPAM 2 MG/ML IJ SOLN: 1.0000 mg | Freq: Four times a day (QID) | INTRAMUSCULAR | Status: DC | PRN

## 2018-09-25 MED ORDER — SODIUM BICARBONATE 8.4 % IV SOLN
150.0000 meq | Freq: Once | INTRAVENOUS | Status: AC
  Administered 2018-09-25: 150 meq via INTRAVENOUS
  Filled 2018-09-25 (×2): qty 50

## 2018-09-25 NOTE — ED Notes (Signed)
Verbal order to d/c restraints because pt is sedated

## 2018-09-25 NOTE — ED Notes (Signed)
Attempted report x1. 

## 2018-09-25 NOTE — Plan of Care (Signed)

## 2018-09-25 NOTE — Progress Notes (Signed)
   09/25/18 2157  MEWS Score  Resp 17  Pulse Rate 79  BP 132/90  Temp 97.9 F (36.6 C)  SpO2 100 %  O2 Device Room Air  MEWS Score  MEWS RR 0  MEWS Pulse 0  MEWS Systolic 0  MEWS LOC 1  MEWS Temp 0  MEWS Score 1  MEWS Score Color Green  MEWS Assessment  Is this an acute change? No  MEWS Guidelines - (patients age 49 and over)  Red - At High Risk for Deterioration Yellow - At risk for Deterioration  1. Go to room and assess patient 2. Validate data. Is this patient's baseline? If data confirmed: 3. Is this an acute change? 4. Administer prn meds/treatments as ordered. 5. Note Sepsis score 6. Review goals of care 7. Sports coach, RRT nurse and Provider. 8. Ask Provider to come to bedside.  9. Document patient condition/interventions/response. 10. Increase frequency of vital signs and focused assessments to at least q15 minutes x 4, then q30 minutes x2. - If stable, then q1h x3, then q4h x3 and then q8h or dept. routine. - If unstable, contact Provider & RRT nurse. Prepare for possible transfer. 11. Add entry in progress notes using the smart phrase ".MEWS". 1. Go to room and assess patient 2. Validate data. Is this patient's baseline? If data confirmed: 3. Is this an acute change? 4. Administer prn meds/treatments as ordered? 5. Note Sepsis score 6. Review goals of care 7. Sports coach and Provider 8. Call RRT nurse as needed. 9. Document patient condition/interventions/response. 10. Increase frequency of vital signs and focused assessments to at least q2h x2. - If stable, then q4h x2 and then q8h or dept. routine. - If unstable, contact Provider & RRT nurse. Prepare for possible transfer. 11. Add entry in progress notes using the smart phrase ".MEWS".  Green - Likely stable Lavender - Comfort Care Only  1. Continue routine/ordered monitoring.  2. Review goals of care. 1. Continue routine/ordered monitoring. 2. Review goals of care.

## 2018-09-25 NOTE — ED Triage Notes (Addendum)
Pt found in a hotel, confused, uncooperative. Ems called, pt was combative, pt placed in restraints and given 5mg  of haldol and 5mg  of versed. Per EMS pt has taken heroin, cocaine, ecstasy, percocet's, and alcohol. Pt was in EMS hard restraints, PA at bedside and gave verbal order to d/c at this time

## 2018-09-25 NOTE — ED Notes (Signed)
ED TO INPATIENT HANDOFF REPORT  ED Nurse Name and Phone #: Jeannett SeniorStephen [phone]  [id number]  S Name/Age/Gender [facility] 48 y.[facility] male Room/Bed: [phone]  Code Status   Code Status: Prior  Home/SNF/Other Home Patient oriented to: self, place, time and situation Is this baseline? Yes   Triage Complete: Triage complete  Chief Complaint OD  Triage Note Pt found in a hotel, confused, uncooperative. Ems called, pt was combative, pt placed in restraints and given 5mg  of haldol and 5mg  of versed. Per EMS pt has taken heroin, cocaine, ecstasy, percocet's, and alcohol. Pt was in EMS hard restraints, PA at bedside and gave verbal order to d/c at this time   Allergies Allergies  Allergen Reactions  . 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 Heparin Itching    Full-body itching inc throat  . Pork-Derived Products Itching    Burning/itching    Level of Care/Admitting Diagnosis ED Disposition    ED Disposition Condition Comment   Admit  [facility] [[id number]]  Level of Care: Telemetry Medical [104]  Covid Evaluation: Asymptomatic Screening Protocol (No Symptoms)  Diagnosis: AKI (acute kidney injury) (HCC) [119147][[id number]]  Admitting Physician: Woody SellerGBATA, SYLVESTER I [[id number]]  Attending Physician: Berton MountGBATA, SYLVESTER I [[id number]]  Estimated length of stay: 3 - 4 days  Certification:: I certify this patient will need inpatient services for at least 2 midnights  PT Class (Do Not Modify): Inpatient [101]  PT Acc Code (Do Not Modify): Private [1]       B Medical/Surgery History Past Medical History:  Diagnosis Date  . Achilles tendon rupture-surgery 09/19/15 09/19/2015  . Altered mental status 12/23/2016  . Arthritis    "hips, knees, back" (10/13/2015)  . Bacterial conjunctivitis of right eye 2018  . Bipolar disorder (HCC)   . CAD -S/P PCI LAD/DES 10/13/15 10/14/2015  . CHF (congestive heart failure) (HCC)   . Cocaine abuse-drug sceen positive, History of 10/14/2015  .  Coronary artery disease   . Depression   . DVT (deep venous thrombosis) (HCC) 09/2015   right  . Essential hypertension 11/30/2015  . Heart murmur   . High cholesterol   . Hypertension   . Myocardial infarction (HCC) 10/12/2015 X 2-3  . NSTEMI (non-ST elevated myocardial infarction) (HCC)   . Osteoarthritis   . Rectal myiasis   . Schizoaffective disorder, bipolar type (HCC) 10/14/2015  . Schizoaffective disorder, bipolar type (HCC)   . Sciatica    Past Surgical History:  Procedure Laterality Date  . CARDIAC CATHETERIZATION N/A 10/13/2015   Procedure: Left Heart Cath and Coronary Angiography;  Surgeon: Corky CraftsJayadeep S Varanasi, MD;  [facility];  Service: Cardiovascular;  Laterality: N/A;  . CARDIAC CATHETERIZATION N/A 10/13/2015   Procedure: Coronary Stent Intervention;  Surgeon: Corky CraftsJayadeep S Varanasi, MD;  [facility];  Service: Cardiovascular;  Laterality: N/A;  . CARDIAC CATHETERIZATION N/A 10/13/2015   Procedure: Intravascular Ultrasound/IVUS;  Surgeon: Corky CraftsJayadeep S Varanasi, MD;  [facility];  Service: Cardiovascular;  Laterality: N/A;  . CORONARY ANGIOPLASTY WITH STENT PLACEMENT  10/13/2015  . I&D EXTREMITY Right 09/19/2015   Procedure: IRRIGATION AND DEBRIDEMENT ANKLE LACERATIONS POSSIBLE TENDON REPAIR;  Surgeon: Cammy CopaScott Gregory Dean, MD;  Location: [facility] OR;  Service: Orthopedics;  Laterality: Right;  . I&D EXTREMITY Right 10/14/2015   Procedure: IRRIGATION AND DEBRIDEMENT EXTREMITY/Right Ankle;  Surgeon: Cammy CopaScott Gregory Dean, MD;  Location: [facility] OR;  Service: Orthopedics;  Laterality: Right;     A IV Location/Drains/Wounds Patient Lines/Drains/Airways Status  Active Line/Drains/Airways    Name:   Placement date:   Placement time:   Site:   Days:   Peripheral IV 09/25/18 Right Antecubital   09/25/18    1610    Antecubital   less than 1          Intake/Output Last 24 hours  Intake/Output Summary (Last 24 hours) at 09/25/2018 2058 Last data  filed at 09/25/2018 1757 Gross per 24 hour  Intake 1500 ml  Output -  Net 1500 ml    Labs/Imaging Results for orders placed or performed during the hospital encounter of 09/25/18 (from the past 48 hour(s))  CBC with Differential     Status: None   Collection Time: 09/25/18  4:06 PM  Result Value Ref Range   WBC 9.6 4.0 - 10.5 K/uL   RBC 4.89 4.22 - 5.81 MIL/uL   Hemoglobin 14.2 13.0 - 17.0 g/dL   HCT 43.2 39.0 - 52.0 %   MCV 88.3 80.0 - 100.0 fL   MCH 29.0 26.0 - 34.0 pg   MCHC 32.9 30.0 - 36.0 g/dL   RDW 15.0 11.5 - 15.5 %   Platelets 230 150 - 400 K/uL   nRBC 0.0 0.0 - 0.2 %   Neutrophils Relative % 82 %   Neutro Abs 7.7 1.7 - 7.7 K/uL   Lymphocytes Relative 13 %   Lymphs Abs 1.3 0.7 - 4.0 K/uL   Monocytes Relative 5 %   Monocytes Absolute 0.5 0.1 - 1.0 K/uL   Eosinophils Relative 0 %   Eosinophils Absolute 0.0 0.0 - 0.5 K/uL   Basophils Relative 0 %   Basophils Absolute 0.0 0.0 - 0.1 K/uL   Immature Granulocytes 0 %   Abs Immature Granulocytes 0.03 0.00 - 0.07 K/uL    Comment: Performed at [facility], [address], [place], [place] [address]  Comprehensive metabolic panel     Status: Abnormal   Collection Time: 09/25/18  4:06 PM  Result Value Ref Range   Sodium 141 135 - 145 mmol/L   Potassium 4.6 3.5 - 5.1 mmol/L   Chloride 106 98 - 111 mmol/L   CO2 19 (L) 22 - 32 mmol/L   Glucose, Bld 126 (H) 70 - 99 mg/dL   BUN 45 (H) 6 - 20 mg/dL   Creatinine, Ser 3.40 (H) 0.61 - 1.24 mg/dL   Calcium 9.4 8.9 - 10.3 mg/dL   Total Protein 7.2 6.5 - 8.1 g/dL   Albumin 4.5 3.5 - 5.0 g/dL   AST 170 (H) 15 - 41 U/L   ALT 36 0 - 44 U/L   Alkaline Phosphatase 55 38 - 126 U/L   Total Bilirubin 1.4 (H) 0.3 - 1.2 mg/dL   GFR calc non Af Amer 20 (L) >60 mL/min   GFR calc Af Amer 23 (L) >60 mL/min   Anion gap 16 (H) 5 - 15    Comment: Performed at [facility], [place] [address], [place], [place] [address]  Ethanol     Status: None   Collection Time: 09/25/18  4:06 PM   Result Value Ref Range   Alcohol, Ethyl (B) <10 <10 mg/dL    Comment: (NOTE) Lowest detectable limit for serum alcohol is 10 mg/dL. For medical purposes only. Performed at [facility], [place] [address] [place], [place] [address]   Salicylate level     Status: None   Collection Time: 09/25/18  4:06 PM  Result Value Ref Range   Salicylate  Lvl <7.0 2.8 - 30.0 mg/dL    Comment: Performed at [facility], [address] [place], [place] [address]  Acetaminophen level     Status: Abnormal   Collection Time: 09/25/18  4:06 PM  Result Value Ref Range   Acetaminophen (Tylenol), Serum <10 (L) 10 - 30 ug/mL    Comment: (NOTE) Therapeutic concentrations vary significantly. A range of 10-30 ug/mL  may be an effective concentration for many patients. However, some  are best treated at concentrations outside of this range. Acetaminophen concentrations >150 ug/mL at 4 hours after ingestion  and >50 ug/mL at 12 hours after ingestion are often associated with  toxic reactions. Performed at [facility], [address] [place], [place] [address]   Troponin I (High Sensitivity)     Status: Abnormal   Collection Time: 09/25/18  4:06 PM  Result Value Ref Range   Troponin I (High Sensitivity) 248 (HH) <18 ng/L    Comment: CRITICAL RESULT CALLED TO, READ BACK BY AND VERIFIED WITH: H DOOLEY RN AT 1714 ON [id number] BY K FORSYTH (NOTE) Elevated high sensitivity troponin I (hsTnI) values and significant  changes across serial measurements may suggest ACS but many other  chronic and acute conditions are known to elevate hsTnI results.  Refer to the Links section for chest pain algorithms and additional  guidance. Performed at [facility], [address], [place], [place] [address]   CK     Status: Abnormal   Collection Time: 09/25/18  4:06 PM  Result Value Ref Range   Total CK 7,827 (H) 49 - 397 U/L    Comment: RESULTS CONFIRMED BY MANUAL DILUTION Performed at [facility], [address] [place], [place] [address]   POC CBG, ED     Status: Abnormal   Collection Time: 09/25/18  4:07 PM  Result Value Ref Range   Glucose-Capillary 121 (H) 70 - 99 mg/dL  POCT I-Stat EG7     Status: Abnormal   Collection Time: 09/25/18  4:22 PM  Result Value Ref Range   pH, Ven 7.340 7.250 - 7.430   pCO2, Ven 37.4 (L) 44.0 - 60.0 mmHg   pO2, Ven 171.0 (H) 32.0 - 45.0 mmHg   Bicarbonate 20.2 20.0 - 28.0 mmol/L   TCO2 21 (L) 22 - 32 mmol/L   O2 Saturation 99.0 %   Acid-base deficit 5.0 (H) 0.0 - 2.0 mmol/L   Sodium 141 135 - 145 mmol/L   Potassium 4.6 3.5 - 5.1 mmol/L   Calcium, Ion 1.14 (L) 1.15 - 1.40 mmol/L   HCT 44.0 39.0 - 52.0 %   Hemoglobin 15.0 13.0 - 17.0 g/dL   Patient temperature HIDE    Sample type VENOUS   Urinalysis, Routine w reflex microscopic     Status: Abnormal   Collection Time: 09/25/18  5:58 PM  Result Value Ref Range   Color, Urine YELLOW YELLOW   APPearance HAZY (A) CLEAR   Specific Gravity, Urine 1.020 1.005 - 1.030   pH 5.0 5.0 - 8.0   Glucose, UA NEGATIVE NEGATIVE mg/dL   Hgb urine dipstick LARGE (A) NEGATIVE   Bilirubin Urine NEGATIVE NEGATIVE   Ketones, ur 5 (A) NEGATIVE mg/dL   Protein, ur 30 (A) NEGATIVE mg/dL   Nitrite NEGATIVE NEGATIVE   Leukocytes,Ua NEGATIVE NEGATIVE   RBC / HPF 0-5 0 - 5 RBC/hpf   WBC, UA 0-5 0 - 5 WBC/hpf   Bacteria, UA RARE (A) NONE SEEN  Squamous Epithelial / LPF 0-5 0 - 5   Mucus PRESENT    Hyaline Casts, UA PRESENT     Comment: Performed at [facility], [address] [place], [place] [address]  Rapid urine drug screen (hospital performed)     Status: Abnormal   Collection Time: 09/25/18  5:58 PM  Result Value Ref Range   Opiates NONE DETECTED NONE DETECTED   Cocaine POSITIVE (A) NONE DETECTED   Benzodiazepines POSITIVE (A) NONE DETECTED   Amphetamines NONE DETECTED NONE DETECTED   Tetrahydrocannabinol NONE DETECTED NONE DETECTED   Barbiturates NONE DETECTED NONE DETECTED     Comment: (NOTE) DRUG SCREEN FOR MEDICAL PURPOSES ONLY.  IF CONFIRMATION IS NEEDED FOR ANY PURPOSE, NOTIFY LAB WITHIN 5 DAYS. LOWEST DETECTABLE LIMITS FOR URINE DRUG SCREEN Drug Class                     Cutoff (ng/mL) Amphetamine and metabolites    1000 Barbiturate and metabolites    200 Benzodiazepine                 200 Tricyclics and metabolites     300 Opiates and metabolites        300 Cocaine and metabolites        300 THC                            50 Performed at [facility], [address] [place], [place] [address]   Troponin I (High Sensitivity)     Status: Abnormal   Collection Time: 09/25/18  6:12 PM  Result Value Ref Range   Troponin I (High Sensitivity) 247 (HH) <18 ng/L    Comment: CRITICAL VALUE NOTED.  VALUE IS CONSISTENT WITH PREVIOUSLY REPORTED AND CALLED VALUE. (NOTE) Elevated high sensitivity troponin I (hsTnI) values and significant  changes across serial measurements may suggest ACS but many other  chronic and acute conditions are known to elevate hsTnI results.  Refer to the Links section for chest pain algorithms and additional  guidance. Performed at [facility], [address] [place], [place] [address]    Dg Chest Portable 1 View  Result Date: 09/25/2018 CLINICAL DATA:  Elevated troponin level. EXAM: PORTABLE CHEST 1 VIEW COMPARISON:  Radiographs of September 14, 2017. FINDINGS: The heart size and mediastinal contours are within normal limits. Both lungs are clear. The visualized skeletal structures are unremarkable. IMPRESSION: No active disease. Electronically Signed   By: Lupita RaiderJames  Green Jr M.D.   On: 09/25/2018 18:16    Pending Labs Unresulted Labs (From admission, onward)    Start     Ordered   09/25/18 1735  SARS CORONAVIRUS 2 (TAT 6-12 HRS) Nasal Swab Aptima Multi Swab  (Asymptomatic/Tier 2 Patients Labs)  Once,   STAT    Question Answer Comment  Is this test for diagnosis or screening Screening   Symptomatic for COVID-19 as  defined by CDC No   Hospitalized for COVID-19 No   Admitted to ICU for COVID-19 No   Previously tested for COVID-19 No   Resident in a congregate (group) care setting No   Employed in healthcare setting No      09/25/18 1734          Vitals/Pain Today's Vitals   09/25/18 1952 09/25/18 2015 09/25/18 2030 09/25/18 2045  BP:  129/85 129/82 (!) 124/98  Pulse:  84 83   Resp:    (!)  8  Temp:      TempSrc:      SpO2:  100% 99%   PainSc: Asleep       Isolation Precautions No active isolations  Medications Medications  sodium chloride 0.9 % bolus 1,000 mL (0 mLs Intravenous Stopped 09/25/18 1757)  dextrose 5 % solution ( Intravenous New Bag/Given 09/25/18 2048)  sodium bicarbonate injection 150 mEq (150 mEq Intravenous Given 09/25/18 2039)    Mobility walks High fall risk   Focused Assessments Cardiac Assessment Handoff:  Cardiac Rhythm: Normal sinus rhythm Lab Results  Component Value Date   CKTOTAL 7,827 (H) 09/25/2018   TROPONINI 0.13 (HH) 10/13/2015   No results found for: DDIMER Does the Patient currently have chest pain? No     R Recommendations: See Admitting Provider Note  Report given to:   Additional Notes:

## 2018-09-25 NOTE — H&P (Signed)
History and Physical  Brett Salinas GLO:[id number] DOB: 13-Nov-1970 DOA: 09/25/2018  Referring physician: ER provider PCP: Patient, No Pcp Per  Outpatient Specialists:    Patient coming from: Found at a hotel  Chief Complaint: Agitation and combative behavior  HPI:  Patient is a 48 year old African-American male past medical history significant for bipolar disorder, schizoaffective disorder, and STEMI, hypertension, hyperlipidemia, DVT, depression, cocaine abuse and CHF.  Patient was found by EMS at the hospital and was noted to be agitated and combative.  Patient was noted to be using illicit substances.  Patient was given antipsychotics and brought to the emergency room.  Urine drug screen was positive for cocaine.  Patient is currently sedated and, therefore, cannot give any history.  Patient was noted to be significantly tachycardic on presentation, but none improved.  Lab work done revealed new AKI with serum creatinine of 3.4 (baseline serum creatinine is 1.5).  Troponin has been stable at 248.  CPKs 7827.  UA revealed hemoglobinuria with minimal RBC.  Patient be admitted for further assessment and management.  ED Course: On presentation to the hospital, patient has been afebrile.  Patient was significantly tachycardic.  Respiratory rate is 17 and blood pressure is 132/90 mmHg.  Patient is currently sedated.  Patient was also committed composure by the ER provider to ensure patient's intention was not to harm himself.  Pertinent labs as documented above.  Hospitalist team will admit patient for further assessment and management.  Pertinent labs: As above.  EKG: Independently reviewed.   Imaging: independently reviewed.   Review of Systems:  Unobtainable.  Past Medical History:  Diagnosis Date  . Achilles tendon rupture-surgery 09/19/15 09/19/2015  . Altered mental status 12/23/2016  . Arthritis    "hips, knees, back" (10/13/2015)  . Bacterial conjunctivitis of right eye 2018   . Bipolar disorder ([place])   . CAD -S/P PCI LAD/DES 10/13/15 10/14/2015  . CHF (congestive heart failure) ([place])   . Cocaine abuse-drug sceen positive, History of 10/14/2015  . Coronary artery disease   . Depression   . DVT (deep venous thrombosis) ([place]) 09/2015   right  . Essential hypertension 11/30/2015  . Heart murmur   . High cholesterol   . Hypertension   . Myocardial infarction ([place]) 10/12/2015 X 2-3  . NSTEMI (non-ST elevated myocardial infarction) ([place])   . Osteoarthritis   . Rectal myiasis   . Schizoaffective disorder, bipolar type ([place]) 10/14/2015  . Schizoaffective disorder, bipolar type ([place])   . Sciatica     Past Surgical History:  Procedure Laterality Date  . CARDIAC CATHETERIZATION N/A 10/13/2015   Procedure: Left Heart Cath and Coronary Angiography;  Surgeon: Jettie Booze, MD;  Location: [facility];  Service: Cardiovascular;  Laterality: N/A;  . CARDIAC CATHETERIZATION N/A 10/13/2015   Procedure: Coronary Stent Intervention;  Surgeon: Jettie Booze, MD;  Location: [facility];  Service: Cardiovascular;  Laterality: N/A;  . CARDIAC CATHETERIZATION N/A 10/13/2015   Procedure: Intravascular Ultrasound/IVUS;  Surgeon: Jettie Booze, MD;  Location: [facility];  Service: Cardiovascular;  Laterality: N/A;  . CORONARY ANGIOPLASTY WITH STENT PLACEMENT  10/13/2015  . I&D EXTREMITY Right 09/19/2015   Procedure: IRRIGATION AND DEBRIDEMENT ANKLE LACERATIONS POSSIBLE TENDON REPAIR;  Surgeon: Meredith Pel, MD;  Location: [facility];  Service: Orthopedics;  Laterality: Right;  . I&D EXTREMITY Right 10/14/2015   Procedure: IRRIGATION AND DEBRIDEMENT EXTREMITY/Right Ankle;  Surgeon: Meredith Pel, MD;  Location: [facility];  Service: Orthopedics;  Laterality: Right;     reports that he has never smoked. He does not have any smokeless tobacco history on file. He reports current alcohol use. He reports current drug use. Drugs: Cocaine and Marijuana.   Allergies  Allergen Reactions  . 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 Heparin Itching    Full-body itching inc throat  . Pork-Derived Products Itching    Burning/itching    No family history on file.   Prior to Admission medications   Medication Sig Start Date End Date Taking? Authorizing Provider  albuterol (VENTOLIN HFA) 108 (90 Base) MCG/ACT inhaler Inhale 2 puffs into the lungs every 6 (six) hours as needed for wheezing or shortness of breath. 06/02/17   Charlott Rakes, MD  ARIPiprazole (ABILIFY) 2 MG tablet Take 1 tablet (2 mg total) by mouth daily. Patient not taking: Reported on 06/02/2017 05/07/17   Virgel Manifold, MD  ARIPiprazole ER 400 MG SRER Inject 400 mg into the muscle every 28 (twenty-eight) days. Next dose on 01/26/2017 Patient not taking: Reported on 06/02/2017 01/26/17   Orson Slick B, MD  aspirin EC 81 MG tablet Take 1 tablet (81 mg total) by mouth daily. Patient not taking: Reported on 06/02/2017 04/18/16   Lottie Mussel T, MD  atorvastatin (LIPITOR) 20 MG tablet Take 1 tablet (20 mg total) by mouth daily. Patient not taking: Reported on 06/02/2017 01/11/17   Dorothy Spark, MD  cetirizine (ZYRTEC) 10 MG tablet Take 1 tablet (10 mg total) by mouth daily. 08/15/17   Charlott Rakes, MD  cetirizine (ZYRTEC) 10 MG tablet Take 10 mg by mouth daily. 08/15/17   [provider]  diclofenac sodium (VOLTAREN) 1 % GEL Apply 4 g topically 4 (four) times daily. Patient not taking: Reported on 08/15/2017 06/02/17   Charlott Rakes, MD  diclofenac sodium (VOLTAREN) 1 % GEL Apply 4 g topically 4 (four) times daily. 08/15/17   [provider]  FLUoxetine (PROZAC) 20 MG capsule Take 20 mg by mouth daily. 08/15/17   [provider]  fluticasone (FLONASE) 50 MCG/ACT nasal spray PLACE 2 SPRAYS INTO BOTH NOSTRILS DAILY. 10/09/17   Charlott Rakes, MD  gabapentin (NEURONTIN) 600 MG tablet Take 1 tablet (600 mg total) by mouth 3 (three) times daily. Patient not  taking: Reported on 06/02/2017 11/09/16   Jamse Arn, MD  gabapentin (NEURONTIN) 600 MG tablet TAKE 1 TABLET BY MOUTH 3 TIMES DAILY. Patient not taking: Reported on 06/02/2017 01/11/17   Jamse Arn, MD  guaiFENesin-codeine [facility]) 100-10 MG/5ML syrup Take 5 mLs by mouth 3 (three) times daily as needed for cough. 08/15/17   Charlott Rakes, MD  metoprolol succinate (TOPROL-XL) 25 MG 24 hr tablet Take 12.5 mg by mouth daily. 07/19/17   [provider]  Multiple Vitamin (MULTIVITAMIN WITH MINERALS) TABS tablet Take 1 tablet by mouth daily. Patient not taking: Reported on 06/02/2017 01/11/17   Charlott Rakes, MD  Multiple Vitamin (MULTIVITAMIN WITH MINERALS) TABS tablet Take 1 tablet by mouth daily. 10/10/17   Ladell Pier, MD  nitroGLYCERIN (NITROSTAT) 0.4 MG SL tablet Place 1 tablet (0.4 mg total) under the tongue every 5 (five) minutes x 3 doses as needed for chest pain. Patient not taking: Reported on 06/02/2017 10/15/15   Meredith Pel, MD  olopatadine (PATANOL) 0.1 % ophthalmic solution PLACE 1 DROP INTO BOTH EYES 2 TIMES DAILY. 10/09/17   Charlott Rakes, MD  terbinafine (LAMISIL) 1 % cream Apply topically daily. Patient not taking: Reported on 06/02/2017 01/06/17  Charlott Rakes, MD  terbinafine (LAMISIL) 1 % cream Apply topically daily. Patient not taking: Reported on 06/02/2017 01/06/17   Charlott Rakes, MD  trimethoprim-polymyxin b (POLYTRIM) ophthalmic solution Place 1 drop into both eyes every 4 (four) hours. Every 4 hours while awake for 5 days. Patient not taking: Reported on 06/02/2017 05/07/17   Virgel Manifold, MD    Physical Exam: Vitals:   09/25/18 2030 09/25/18 2045 09/25/18 2100 09/25/18 2115  BP: 129/82 (!) 124/98 (!) 130/101 (!) 127/96  Pulse: 83     Resp:  (!) 8 14 (!) 23  Temp:      TempSrc:      SpO2: 99%        Constitutional:  Sedated  eyes:  . No pallor. No jaundice.  ENMT:  . external ears, nose appear normal Neck:  . Neck is  supple. No JVD Respiratory:  . CTA bilaterally, no w/r/r.  . Respiratory effort normal. No retractions or accessory muscle use Cardiovascular:  . S1S2 . No LE extremity edema   Abdomen:  . Abdomen is soft and non tender. Organs are difficult to assess. Neurologic:  . Awake and alert. . Moves all limbs.  Wt Readings from Last 3 Encounters:  01/16/18 99.8 kg  08/15/17 94.7 kg  06/02/17 98.3 kg    I have personally reviewed following labs and imaging studies  Labs on Admission:  CBC: Recent Labs  Lab 09/25/18 1606 09/25/18 1622  WBC 9.6  --   NEUTROABS 7.7  --   HGB 14.2 15.0  HCT 43.2 44.0  MCV 88.3  --   PLT 230  --    Basic Metabolic Panel: Recent Labs  Lab 09/25/18 1606 09/25/18 1622  NA 141 141  K 4.6 4.6  CL 106  --   CO2 19*  --   GLUCOSE 126*  --   BUN 45*  --   CREATININE 3.40*  --   CALCIUM 9.4  --    Liver Function Tests: Recent Labs  Lab 09/25/18 1606  AST 170*  ALT 36  ALKPHOS 55  BILITOT 1.4*  PROT 7.2  ALBUMIN 4.5   No results for input(s): LIPASE, AMYLASE in the last 168 hours. No results for input(s): AMMONIA in the last 168 hours. Coagulation Profile: No results for input(s): INR, PROTIME in the last 168 hours. Cardiac Enzymes: Recent Labs  Lab 09/25/18 1606  CKTOTAL 7,827*   BNP (last 3 results) No results for input(s): PROBNP in the last 8760 hours. HbA1C: No results for input(s): HGBA1C in the last 72 hours. CBG: Recent Labs  Lab 09/25/18 1607  GLUCAP 121*   Lipid Profile: No results for input(s): CHOL, HDL, LDLCALC, TRIG, CHOLHDL, LDLDIRECT in the last 72 hours. Thyroid Function Tests: No results for input(s): TSH, T4TOTAL, FREET4, T3FREE, THYROIDAB in the last 72 hours. Anemia Panel: No results for input(s): VITAMINB12, FOLATE, FERRITIN, TIBC, IRON, RETICCTPCT in the last 72 hours. Urine analysis:    Component Value Date/Time   COLORURINE YELLOW 09/25/2018 1758   APPEARANCEUR HAZY (A) 09/25/2018 1758    LABSPEC 1.020 09/25/2018 1758   PHURINE 5.0 09/25/2018 1758   GLUCOSEU NEGATIVE 09/25/2018 1758   HGBUR LARGE (A) 09/25/2018 1758   BILIRUBINUR NEGATIVE 09/25/2018 1758   KETONESUR 5 (A) 09/25/2018 1758   PROTEINUR 30 (A) 09/25/2018 1758   NITRITE NEGATIVE 09/25/2018 1758   LEUKOCYTESUR NEGATIVE 09/25/2018 1758   Sepsis Labs: '@LABRCNTIP'$ (procalcitonin:4,lacticidven:4) )No results found for this or any previous visit (from the past 240  hour(s)).    Radiological Exams on Admission: Dg Chest Portable 1 View  Result Date: 09/25/2018 CLINICAL DATA:  Elevated troponin level. EXAM: PORTABLE CHEST 1 VIEW COMPARISON:  Radiographs of September 14, 2017. FINDINGS: The heart size and mediastinal contours are within normal limits. Both lungs are clear. The visualized skeletal structures are unremarkable. IMPRESSION: No active disease. Electronically Signed   By: Marijo Conception M.D.   On: 09/25/2018 18:16    EKG: Independently reviewed.   Active Problems:   AKI (acute kidney injury) ([place])   Assessment/Plan Acute kidney injury: Likely secondary to rhabdomyolysis following cocaine use Aggressive hydration Monitor renal function and electrolytes Avoid nephrotoxic medications Dose all medications assuming EGFR of 10 mils per minute Further management depend on hospital course.  Rhabdomyolysis: Likely secondary to cocaine use Aggressive hydration Monitor and correct electrolytes  Cocaine use/abuse: Continue supportive care Benzodiazepine for agitation  Patient is currently committed involuntarily to rule out suicidal ideation/behavior  Coronary artery disease status post PCI as per history: Troponin is elevated but flat in setting of acute kidney injury Continue to monitor closely.  Hypertension: Currently controlled.  Tachycardia: Resolving. Nausea adequate hydration Benzodiazepines as needed Management depend on hospital course  DVT prophylaxis: Subcutaneous heparin Code  Status: Full code Family Communication: Wife Disposition Plan: This will depend on hospital course Consults called: Have a low threshold to consult psychiatry and nephrology Admission status: Inpatient  Time spent: 65 minutes  Dana Allan, MD  [facility] Pager #: [phone] 7PM-7AM contact night coverage as above  09/25/2018, 9:34 PM

## 2018-09-25 NOTE — ED Provider Notes (Signed)
[facility] EMERGENCY DEPARTMENT Provider Note   CSN: [id number] Arrival date & time: 09/25/18  1556     History   Chief Complaint Chief Complaint  Patient presents with  . Drug Overdose    HPI Brett Salinas is a 48 y.o. male presenting for agitation.   Level V caveat, pt altered.   Per EMS, pt was agiaatted and aggressive, prompting [employer] to be called to the hotel room he was at. Per EMS, pt took alcohol, cocaine, percocet, heroin, and ecstacy. Pt was found to be tachycartic to the 200s and diaphoretic. Give versed and 2 doses of haldol and 1 L fluids.   Additional history obtained from chart review. Pt with h/o NSTEMI in 2017. Follows with Dr. Delton See, last visin in 2018.  "Pt w/ hx of htn, cad, nstemi 10/12/15 w/ + troponins.   Cath done 10/13/15 revealed a 90% proximal LAD and no other significant CAD. He underwent successful PCI with DES. Echo revealed normal left ventricular EF at 60-65%. Mild MR was also noted. He wasplaced on dual antiplatelet therapy with aspirin and Brilinta as well as carvedilol and atorvastatin."  Per chart review, ? If pt is taking heart medications.  Additional history of polysubstance abuse and HTN.      HPI  Past Medical History:  Diagnosis Date  . Achilles tendon rupture-surgery 09/19/15 09/19/2015  . Altered mental status 12/23/2016  . Arthritis    "hips, knees, back" (10/13/2015)  . Bacterial conjunctivitis of right eye 2018  . Bipolar disorder (HCC)   . CAD -S/P PCI LAD/DES 10/13/15 10/14/2015  . CHF (congestive heart failure) (HCC)   . Cocaine abuse-drug sceen positive, History of 10/14/2015  . Coronary artery disease   . Depression   . DVT (deep venous thrombosis) (HCC) 09/2015   right  . Essential hypertension 11/30/2015  . Heart murmur   . High cholesterol   . Hypertension   . Myocardial infarction (HCC) 10/12/2015 X 2-3  . NSTEMI (non-ST elevated myocardial infarction) (HCC)   . Osteoarthritis   . Rectal  myiasis   . Schizoaffective disorder, bipolar type (HCC) 10/14/2015  . Schizoaffective disorder, bipolar type (HCC)   . Sciatica     Patient Active Problem List   Diagnosis Date Noted  . AKI (acute kidney injury) (HCC) 09/25/2018  . Tobacco use disorder 12/29/2016  . COPD (chronic obstructive pulmonary disease) (HCC) 12/28/2016  . Dyslipidemia 12/28/2016  . 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 Drug abuse (HCC)   . Agitation 12/24/2016  . Aggression 12/24/2016  . Altered mental status 12/23/2016  . History of ED visit for Medical clearance for incarceration for alleged aggressive behavior and crack cocaine use 09/05/2016  . Osteoarthritis 07/20/2016  . Essential hypertension 11/30/2015  . CAD -S/P PCI LAD/DES 10/13/15 10/14/2015  . Wound infection- for I&D achilles wound 10/14/15 10/14/2015  . Cocaine abuse-drug sceen positive, History of 10/14/2015  . Schizoaffective disorder, bipolar type (HCC) 10/14/2015  . Infection 10/14/2015  . NSTEMI (non-ST elevated myocardial infarction) (HCC)   . Chest pain 10/12/2015  . Achilles tendon rupture-surgery 09/19/15 09/19/2015    Past Surgical History:  Procedure Laterality Date  . CARDIAC CATHETERIZATION N/A 10/13/2015   Procedure: Left Heart Cath and Coronary Angiography;  Surgeon: Corky Crafts, MD;  Location: [facility] INVASIVE CV LAB;  Service: Cardiovascular;  Laterality: N/A;  . CARDIAC CATHETERIZATION N/A 10/13/2015   Procedure: Coronary Stent Intervention;  Surgeon: Corky Crafts, MD;  Location: [facility] INVASIVE CV  LAB;  Service: Cardiovascular;  Laterality: N/A;  . CARDIAC CATHETERIZATION N/A 10/13/2015   Procedure: Intravascular Ultrasound/IVUS;  Surgeon: Corky Crafts, MD;  Location: [facility] INVASIVE CV LAB;  Service: Cardiovascular;  Laterality: N/A;  . CORONARY ANGIOPLASTY WITH STENT PLACEMENT  10/13/2015  . I&D EXTREMITY Right 09/19/2015   Procedure: IRRIGATION AND DEBRIDEMENT ANKLE LACERATIONS POSSIBLE TENDON  REPAIR;  Surgeon: Cammy Copa, MD;  Location: [facility] OR;  Service: Orthopedics;  Laterality: Right;  . I&D EXTREMITY Right 10/14/2015   Procedure: IRRIGATION AND DEBRIDEMENT EXTREMITY/Right Ankle;  Surgeon: Cammy Copa, MD;  Location: [facility] OR;  Service: Orthopedics;  Laterality: Right;        Home Medications    Prior to Admission medications   Medication Sig Start Date End Date Taking? Authorizing Provider  albuterol (VENTOLIN HFA) 108 (90 Base) MCG/ACT inhaler Inhale 2 puffs into the lungs every 6 (six) hours as needed for wheezing or shortness of breath. 06/02/17   Hoy Register, MD  ARIPiprazole (ABILIFY) 2 MG tablet Take 1 tablet (2 mg total) by mouth daily. Patient not taking: Reported on 06/02/2017 05/07/17   Raeford Razor, MD  ARIPiprazole ER 400 MG SRER Inject 400 mg into the muscle every 28 (twenty-eight) days. Next dose on 01/26/2017 Patient not taking: Reported on 06/02/2017 01/26/17   Kristine Linea B, MD  aspirin EC 81 MG tablet Take 1 tablet (81 mg total) by mouth daily. Patient not taking: Reported on 06/02/2017 04/18/16   Dierdre Searles T, MD  atorvastatin (LIPITOR) 20 MG tablet Take 1 tablet (20 mg total) by mouth daily. Patient not taking: Reported on 06/02/2017 01/11/17   Lars Masson, MD  cetirizine (ZYRTEC) 10 MG tablet Take 1 tablet (10 mg total) by mouth daily. 08/15/17   Hoy Register, MD  cetirizine (ZYRTEC) 10 MG tablet Take 10 mg by mouth daily. 08/15/17   [provider]  diclofenac sodium (VOLTAREN) 1 % GEL Apply 4 g topically 4 (four) times daily. Patient not taking: Reported on 08/15/2017 06/02/17   Hoy Register, MD  diclofenac sodium (VOLTAREN) 1 % GEL Apply 4 g topically 4 (four) times daily. 08/15/17   [provider]  FLUoxetine (PROZAC) 20 MG capsule Take 20 mg by mouth daily. 08/15/17   [provider]  fluticasone (FLONASE) 50 MCG/ACT nasal spray PLACE 2 SPRAYS INTO BOTH NOSTRILS DAILY. 10/09/17   Hoy Register, MD   gabapentin (NEURONTIN) 600 MG tablet Take 1 tablet (600 mg total) by mouth 3 (three) times daily. Patient not taking: Reported on 06/02/2017 11/09/16   Marcello Fennel, MD  gabapentin (NEURONTIN) 600 MG tablet TAKE 1 TABLET BY MOUTH 3 TIMES DAILY. Patient not taking: Reported on 06/02/2017 01/11/17   Marcello Fennel, MD  guaiFENesin-codeine [facility]) 100-10 MG/5ML syrup Take 5 mLs by mouth 3 (three) times daily as needed for cough. 08/15/17   Hoy Register, MD  metoprolol succinate (TOPROL-XL) 25 MG 24 hr tablet Take 12.5 mg by mouth daily. 07/19/17   [provider]  Multiple Vitamin (MULTIVITAMIN WITH MINERALS) TABS tablet Take 1 tablet by mouth daily. Patient not taking: Reported on 06/02/2017 01/11/17   Hoy Register, MD  Multiple Vitamin (MULTIVITAMIN WITH MINERALS) TABS tablet Take 1 tablet by mouth daily. 10/10/17   Marcine Matar, MD  nitroGLYCERIN (NITROSTAT) 0.4 MG SL tablet Place 1 tablet (0.4 mg total) under the tongue every 5 (five) minutes x 3 doses as needed for chest pain. Patient not taking: Reported on 06/02/2017 10/15/15  Cammy Copaean, Gregory Scott, MD  olopatadine (PATANOL) 0.1 % ophthalmic solution PLACE 1 DROP INTO BOTH EYES 2 TIMES DAILY. 10/09/17   Hoy RegisterNewlin, Enobong, MD  terbinafine (LAMISIL) 1 % cream Apply topically daily. Patient not taking: Reported on 06/02/2017 01/06/17   Hoy RegisterNewlin, Enobong, MD  terbinafine (LAMISIL) 1 % cream Apply topically daily. Patient not taking: Reported on 06/02/2017 01/06/17   Hoy RegisterNewlin, Enobong, MD  trimethoprim-polymyxin b (POLYTRIM) ophthalmic solution Place 1 drop into both eyes every 4 (four) hours. Every 4 hours while awake for 5 days. Patient not taking: Reported on 06/02/2017 05/07/17   Raeford RazorKohut, Stephen, MD    Family History No family history on file.  Social History Social History   Tobacco Use  . Smoking status: Never Smoker  Substance Use Topics  . Alcohol use: Yes  . Drug use: Yes    Types: Cocaine, Marijuana    Comment:  10/13/2015 "I've tried alot; I don't have any habits"     Allergies   Catfish [fish allergy], Heparin, and Pork-derived products   Review of Systems Review of Systems  Unable to perform ROS: Mental status change     Physical Exam Updated Vital Signs BP 132/90 (BP Location: Left Arm)   Pulse 79   Temp 97.9 F (36.6 C) (Oral)   Resp 17   Ht 6\' 2"  (1.88 m)   Wt 94.1 kg   SpO2 100%   BMI 26.64 kg/m   Physical Exam Vitals signs and nursing note reviewed.  Constitutional:      Appearance: He is well-developed. He is diaphoretic.     Comments: Will open eyes to name. Will follow basic commands.  diaphoretic  HENT:     Head: Normocephalic.  Eyes:     Comments: Pupils equal  Neck:     Musculoskeletal: Normal range of motion and neck supple.  Cardiovascular:     Rate and Rhythm: Normal rate and regular rhythm.     Pulses: Normal pulses.  Pulmonary:     Effort: Pulmonary effort is normal. No respiratory distress.     Breath sounds: Normal breath sounds. No wheezing.  Abdominal:     General: There is no distension.     Palpations: Abdomen is soft. There is no mass.     Tenderness: There is no abdominal tenderness. There is no guarding or rebound.  Musculoskeletal: Normal range of motion.  Skin:    General: Skin is warm.     Capillary Refill: Capillary refill takes less than 2 seconds.  Neurological:     GCS: GCS eye subscore is 3. GCS verbal subscore is 1. GCS motor subscore is 6.      ED Treatments / Results  Labs (all labs ordered are listed, but only abnormal results are displayed) Labs Reviewed  COMPREHENSIVE METABOLIC PANEL - Abnormal; Notable for the following components:      Result Value   CO2 19 (*)    Glucose, Bld 126 (*)    BUN 45 (*)    Creatinine, Ser 3.40 (*)    AST 170 (*)    Total Bilirubin 1.4 (*)    GFR calc non Af Amer 20 (*)    GFR calc Af Amer 23 (*)    Anion gap 16 (*)    All other components within normal limits  URINALYSIS, ROUTINE  W REFLEX MICROSCOPIC - Abnormal; Notable for the following components:   APPearance HAZY (*)    Hgb urine dipstick LARGE (*)    Ketones, ur 5 (*)  Protein, ur 30 (*)    Bacteria, UA RARE (*)    All other components within normal limits  RAPID URINE DRUG SCREEN, HOSP PERFORMED - Abnormal; Notable for the following components:   Cocaine POSITIVE (*)    Benzodiazepines POSITIVE (*)    All other components within normal limits  ACETAMINOPHEN LEVEL - Abnormal; Notable for the following components:   Acetaminophen (Tylenol), Serum <10 (*)    All other components within normal limits  CK - Abnormal; Notable for the following components:   Total CK 7,827 (*)    All other components within normal limits  MAGNESIUM - Abnormal; Notable for the following components:   Magnesium 2.6 (*)    All other components within normal limits  CBG MONITORING, ED - Abnormal; Notable for the following components:   Glucose-Capillary 121 (*)    All other components within normal limits  POCT I-STAT EG7 - Abnormal; Notable for the following components:   pCO2, Ven 37.4 (*)    pO2, Ven 171.0 (*)    TCO2 21 (*)    Acid-base deficit 5.0 (*)    Calcium, Ion 1.14 (*)    All other components within normal limits  TROPONIN I (HIGH SENSITIVITY) - Abnormal; Notable for the following components:   Troponin I (High Sensitivity) 248 (*)    All other components within normal limits  TROPONIN I (HIGH SENSITIVITY) - Abnormal; Notable for the following components:   Troponin I (High Sensitivity) 247 (*)    All other components within normal limits  SARS CORONAVIRUS 2 (TAT 6-12 HRS)  CBC WITH DIFFERENTIAL/PLATELET  ETHANOL  SALICYLATE LEVEL  PHOSPHORUS  HIV ANTIBODY (ROUTINE TESTING W REFLEX)  COMPREHENSIVE METABOLIC PANEL  I-STAT VENOUS BLOOD GAS, ED    EKG EKG Interpretation  Date/Time:  Tuesday September 25 2018 16:02:59 EDT Ventricular Rate:  101 PR Interval:    QRS Duration: 84 QT Interval:  353 QTC  Calculation: 458 R Axis:   55 Text Interpretation:  Sinus tachycardia Prominent P waves, nondiagnostic Consider left ventricular hypertrophy ST elev, probable normal early repol pattern tachycardia new from previous Confirmed by Theotis Burrow [phone]) on 09/25/2018 4:14:30 PM   Radiology Dg Chest Portable 1 View  Result Date: 09/25/2018 CLINICAL DATA:  Elevated troponin level. EXAM: PORTABLE CHEST 1 VIEW COMPARISON:  Radiographs of September 14, 2017. FINDINGS: The heart size and mediastinal contours are within normal limits. Both lungs are clear. The visualized skeletal structures are unremarkable. IMPRESSION: No active disease. Electronically Signed   By: Marijo Conception M.D.   On: 09/25/2018 18:16    Procedures .Critical Care Performed by: Franchot Heidelberg, PA-C Authorized by: Franchot Heidelberg, PA-C   Critical care provider statement:    Critical care time (minutes):  45   Critical care time was exclusive of:  Separately billable procedures and treating other patients and teaching time   Critical care was necessary to treat or prevent imminent or life-threatening deterioration of the following conditions:  Renal failure   Critical care was time spent personally by me on the following activities:  Blood draw for specimens, development of treatment plan with patient or surrogate, evaluation of patient's response to treatment, examination of patient, obtaining history from patient or surrogate, ordering and performing treatments and interventions, ordering and review of laboratory studies, ordering and review of radiographic studies, pulse oximetry, re-evaluation of patient's condition and review of old charts   I assumed direction of critical care for this patient from another provider in my specialty:  no   Comments:     Pt with AKI 2/2 rhabdo. Given fluids and admitted to the hospital.    (including critical care time)  Medications Ordered in ED Medications  aspirin EC tablet 81 mg (has  no administration in time range)  metoprolol succinate (TOPROL-XL) 24 hr tablet 12.5 mg (has no administration in time range)  olopatadine (PATANOL) 0.1 % ophthalmic solution 1 drop (has no administration in time range)  sodium bicarbonate 150 mEq in dextrose 5% 1000 mL infusion (has no administration in time range)  LORazepam (ATIVAN) tablet 1 mg (has no administration in time range)    Or  LORazepam (ATIVAN) injection 1 mg (has no administration in time range)  thiamine (VITAMIN B-1) tablet 100 mg (has no administration in time range)    Or  thiamine (B-1) injection 100 mg (has no administration in time range)  folic acid (FOLVITE) tablet 1 mg (has no administration in time range)  multivitamin with minerals tablet 1 tablet (has no administration in time range)  fondaparinux (ARIXTRA) injection 2.5 mg (has no administration in time range)  sodium chloride 0.9 % bolus 1,000 mL (0 mLs Intravenous Stopped 09/25/18 1757)  dextrose 5 % solution ( Intravenous Transfusing/Transfer 09/25/18 2134)  sodium bicarbonate injection 150 mEq (150 mEq Intravenous Given 09/25/18 2039)     Initial Impression / Assessment and Plan / ED Course  I have reviewed the triage vital signs and the nursing notes.  Pertinent labs & imaging results that were available during my care of the patient were reviewed by me and considered in my medical decision making (see chart for details).        Patient presenting altered after presumed polysubstance abuse and medications given with EMS for agitation.  Considering patient's history of cardiac issues, as well as his tachycardia and diaphoresis, will obtain labs including troponin.  Labs show AKI with a creatinine of 3.4.  Trop elevated at 248. Will consult with cards. Case discussed with attending, Dr. Clarene DukeLittle evaluated the pt.   Discussed with cardiology who recommended serial trop trending and re consult to cards as needed. Will admit to hospitalist service.   Pt  more awake/aware. Alert to person and place, not event. Denies CP.   Pt wanting to leave. Discussed with pt that his heart and kidneys are showing signs of damange. Pt states he is willing to die. When asked if his OD was a sucide attempt, pt declined to answer. As I cannot assure he is in his right mind (due to drugs) and ?SI, will start IVC process.   [facility]. Requesting CK, D5 at rate of 150, and 3 amps of bicarb.     Final Clinical Impressions(s) / ED Diagnoses   Final diagnoses:  AKI (acute kidney injury) (HCC)  Elevated troponin  Polysubstance abuse [facility](HCC)  Non-traumatic rhabdomyolysis    ED Discharge Orders    None       Alveria ApleyCaccavale, Maye Parkinson, PA-C 09/25/18 2313    Little, Ambrose Finlandachel Morgan, MD 09/27/18 1501

## 2018-09-25 NOTE — ED Notes (Signed)
Pt wanded by security. 

## 2018-09-25 NOTE — Progress Notes (Signed)
Tarl A. Latona was admitted to 5w31 from the ED via stretcher.  The patient was transferred from stretcher to bed without incident.  The patient is alert, but not interactive, and only responds minimally to voice.  Because of this, ability to complete admission charting was limited.  There is a sitter present due to the patient's IVC.  Bed is in the lowest position, call bell and telephone are within reach.  Patient denies any needs at this time.

## 2018-09-26 DIAGNOSIS — F1910 Other psychoactive substance abuse, uncomplicated: Secondary | ICD-10-CM

## 2018-09-26 DIAGNOSIS — G92 Toxic encephalopathy: Secondary | ICD-10-CM

## 2018-09-26 DIAGNOSIS — R7989 Other specified abnormal findings of blood chemistry: Secondary | ICD-10-CM

## 2018-09-26 LAB — COMPREHENSIVE METABOLIC PANEL
ALT: 43 U/L (ref 0–44)
AST: 207 U/L — ABNORMAL HIGH (ref 15–41)
Albumin: 3.4 g/dL — ABNORMAL LOW (ref 3.5–5.0)
Alkaline Phosphatase: 48 U/L (ref 38–126)
Anion gap: 8 (ref 5–15)
BUN: 27 mg/dL — ABNORMAL HIGH (ref 6–20)
CO2: 31 mmol/L (ref 22–32)
Calcium: 8.4 mg/dL — ABNORMAL LOW (ref 8.9–10.3)
Chloride: 102 mmol/L (ref 98–111)
Creatinine, Ser: 1.52 mg/dL — ABNORMAL HIGH (ref 0.61–1.24)
GFR calc Af Amer: >60 mL/min (ref >60)
GFR calc non Af Amer: 53 mL/min — ABNORMAL LOW (ref >60)
Glucose, Bld: 108 mg/dL — ABNORMAL HIGH (ref 70–99)
Potassium: 3.8 mmol/L (ref 3.5–5.1)
Sodium: 141 mmol/L (ref 135–145)
Total Bilirubin: 1.9 mg/dL — ABNORMAL HIGH (ref 0.3–1.2)
Total Protein: 6 g/dL — ABNORMAL LOW (ref 6.5–8.1)

## 2018-09-26 LAB — CK: Total CK: 10935 U/L — ABNORMAL HIGH (ref 49–397)

## 2018-09-26 LAB — HIV ANTIBODY (ROUTINE TESTING W REFLEX): HIV Screen 4th Generation wRfx: NONREACTIVE

## 2018-09-26 LAB — TROPONIN I (HIGH SENSITIVITY): Troponin I (High Sensitivity): 173 ng/L (ref <18)

## 2018-09-26 MED ORDER — SODIUM BICARBONATE-DEXTROSE 150-5 MEQ/L-% IV SOLN
150.0000 meq | INTRAVENOUS | Status: AC
  Administered 2018-09-26 (×2): 150 meq via INTRAVENOUS
  Filled 2018-09-26 (×4): qty 1000

## 2018-09-26 MED ORDER — TRAMADOL HCL 50 MG PO TABS
50.0000 mg | ORAL_TABLET | Freq: Once | ORAL | Status: AC
  Administered 2018-09-26: 50 mg via ORAL
  Filled 2018-09-26 (×2): qty 1

## 2018-09-26 NOTE — Consult Note (Signed)
Telepsych Consultation   Reason for Consult:  "Admitted due to polysubstance abuse including cocaine, acute toxic metabolic encephalopathy, acute kidney injury and rhabdomyolysis. Per EDP: "Pt states he is willing to die. When asked if his OD was a sucide attempt, pt declined to answer" ? Suicidal" Referring Physician:  Dr. Marcellus Scott Location of Patient: [facility] Location of Provider: [facility]  Patient Identification: Brett Salinas MRN:  [id number] Principal Diagnosis: Polysubstance abuse [facility]) Diagnosis:  Active Problems:   AKI (acute kidney injury) (HCC)   Total Time spent with patient: 1 hour  Subjective:   Brett Salinas is a 48 y.o. male patient admitted with AKI.  HPI:   Per chart review, patient was admitted with AKI likely secondary to polysubstance abuse (Percocet, heroin, ecstasy and alcohol) and associated with rhabdomyolysis and dehydration. UDS was positive for benzodiazepines and cocaine and BAL was negative on admission. He received Versed and Haldol due to agitation. He was IVC'd by the EDP. He endorsed SI so psychiatry was consulted.   Of note, patient was last seen by telemedicine on 8/16 for SI. He denied SI, HI or AVH. He admitted to making suicidal comments due to frustration about his living situation. He was psychiatrically cleared. It appears that he received his LAI of Abilify 400 mg on 4/7 when he was seen by telemedicine.   On interview, [name] reports denies SI, HI or AVH. He does not remember making suicidal statements. He reports that his mood is fair. He is followed by [facility] [place]. He takes Jordan, Remeron and Risperdal. He reports medication compliance. He reports a good appetite. He reports fair sleep with Remeron.   Past Psychiatric History: Schizoaffective disorder, bipolar type and polysubstance abuse. History of multiple suicide attempts.   Risk to Self:  None. Denies SI.  Risk to Others:  None.  Denies HI. Prior Inpatient Therapy:  He was admitted to [facility] in 12/2016 for psychosis.   Prior Outpatient Therapy:   He is followed by [facility] [place].   Past Medical History:  Past Medical History:  Diagnosis Date  . Achilles tendon rupture-surgery 09/19/15 09/19/2015  . Altered mental status 12/23/2016  . Arthritis    "hips, knees, back" (10/13/2015)  . Bacterial conjunctivitis of right eye 2018  . Bipolar disorder (HCC)   . CAD -S/P PCI LAD/DES 10/13/15 10/14/2015  . CHF (congestive heart failure) (HCC)   . Cocaine abuse-drug sceen positive, History of 10/14/2015  . Coronary artery disease   . Depression   . DVT (deep venous thrombosis) (HCC) 09/2015   right  . Essential hypertension 11/30/2015  . Heart murmur   . High cholesterol   . Hypertension   . Myocardial infarction (HCC) 10/12/2015 X 2-3  . NSTEMI (non-ST elevated myocardial infarction) (HCC)   . Osteoarthritis   . Rectal myiasis   . Schizoaffective disorder, bipolar type (HCC) 10/14/2015  . Schizoaffective disorder, bipolar type (HCC)   . Sciatica     Past Surgical History:  Procedure Laterality Date  . CARDIAC CATHETERIZATION N/A 10/13/2015   Procedure: Left Heart Cath and Coronary Angiography;  Surgeon: Corky Crafts, MD;  Location: [facility] INVASIVE CV LAB;  Service: Cardiovascular;  Laterality: N/A;  . CARDIAC CATHETERIZATION N/A 10/13/2015   Procedure: Coronary Stent Intervention;  Surgeon: Corky Crafts, MD;  Location: [facility] INVASIVE CV LAB;  Service: Cardiovascular;  Laterality: N/A;  . CARDIAC CATHETERIZATION N/A 10/13/2015   Procedure: Intravascular Ultrasound/IVUS;  Surgeon: Donnie Coffin  Eldridge DaceVaranasi, MD;  [facility] CV LAB;  Service: Cardiovascular;  Laterality: N/A;  . CORONARY ANGIOPLASTY WITH STENT PLACEMENT  10/13/2015  . I&D EXTREMITY Right 09/19/2015   Procedure: IRRIGATION AND DEBRIDEMENT ANKLE LACERATIONS POSSIBLE TENDON REPAIR;  Surgeon: Cammy CopaScott Gregory Dean, MD;  Location: [facility] OR;   Service: Orthopedics;  Laterality: Right;  . I&D EXTREMITY Right 10/14/2015   Procedure: IRRIGATION AND DEBRIDEMENT EXTREMITY/Right Ankle;  Surgeon: Cammy CopaScott Gregory Dean, MD;  Location: [facility] OR;  Service: Orthopedics;  Laterality: Right;   Family History: No family history on file. Family Psychiatric  History: Denies  Social History:  Social History   Substance and Sexual Activity  Alcohol Use Yes     Social History   Substance and Sexual Activity  Drug Use Yes  . Types: Cocaine, Marijuana   Comment: 10/13/2015 "I've tried alot; I don't have any habits"    Social History   Socioeconomic History  . Marital status: Single    Spouse name: Not on file  . Number of children: Not on file  . Years of education: Not on file  . Highest education level: Not on file  Occupational History  . Not on file  Social Needs  . Financial resource strain: Not on file  . Food insecurity    Worry: Not on file    Inability: Not on file  . Transportation needs    Medical: Not on file    Non-medical: Not on file  Tobacco Use  . Smoking status: Never Smoker  Substance and Sexual Activity  . Alcohol use: Yes  . Drug use: Yes    Types: Cocaine, Marijuana    Comment: 10/13/2015 "I've tried alot; I don't have any habits"  . Sexual activity: Not on file  Lifestyle  . Physical activity    Days per week: Not on file    Minutes per session: Not on file  . Stress: Not on file  Relationships  . Social Musicianconnections    Talks on phone: Not on file    Gets together: Not on file    Attends religious service: Not on file    Active member of club or organization: Not on file    Attends meetings of clubs or organizations: Not on file    Relationship status: Not on file  Other Topics Concern  . Not on file  Social History Narrative   ** Merged History Encounter **       Additional Social History: He is married. He has 11 children. He is a Cytogeneticistveteran. He served in [employer]. He reports daily alcohol use and  illicit substance use 1-2 times a week.    Allergies:   Allergies  Allergen Reactions  . 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 Heparin Itching    Full-body itching inc throat  . Pork-Derived Products Itching    Burning/itching    Labs:  Results for orders placed or performed during the hospital encounter of 09/25/18 (from the past 48 hour(s))  CBC with Differential     Status: None   Collection Time: 09/25/18  4:06 PM  Result Value Ref Range   WBC 9.6 4.0 - 10.5 K/uL   RBC 4.89 4.22 - 5.81 MIL/uL   Hemoglobin 14.2 13.0 - 17.0 g/dL   HCT 16.143.2 09.639.0 - 04.552.0 %   MCV 88.3 80.0 - 100.0 fL   MCH 29.0 26.0 - 34.0 pg   MCHC 32.9 30.0 - 36.0 g/dL   RDW 40.915.0 81.111.5 - 91.415.5 %  Platelets 230 150 - 400 K/uL   nRBC 0.0 0.0 - 0.2 %   Neutrophils Relative % 82 %   Neutro Abs 7.7 1.7 - 7.7 K/uL   Lymphocytes Relative 13 %   Lymphs Abs 1.3 0.7 - 4.0 K/uL   Monocytes Relative 5 %   Monocytes Absolute 0.5 0.1 - 1.0 K/uL   Eosinophils Relative 0 %   Eosinophils Absolute 0.0 0.0 - 0.5 K/uL   Basophils Relative 0 %   Basophils Absolute 0.0 0.0 - 0.1 K/uL   Immature Granulocytes 0 %   Abs Immature Granulocytes 0.03 0.00 - 0.07 K/uL    Comment: Performed at [facility] Lab, [address] [place], [place] [address]  Comprehensive metabolic panel     Status: Abnormal   Collection Time: 09/25/18  4:06 PM  Result Value Ref Range   Sodium 141 135 - 145 mmol/L   Potassium 4.6 3.5 - 5.1 mmol/L   Chloride 106 98 - 111 mmol/L   CO2 19 (L) 22 - 32 mmol/L   Glucose, Bld 126 (H) 70 - 99 mg/dL   BUN 45 (H) 6 - 20 mg/dL   Creatinine, Ser 1.91 (H) 0.61 - 1.24 mg/dL   Calcium 9.4 8.9 - 47.8 mg/dL   Total Protein 7.2 6.5 - 8.1 g/dL   Albumin 4.5 3.5 - 5.0 g/dL   AST 295 (H) 15 - 41 U/L   ALT 36 0 - 44 U/L   Alkaline Phosphatase 55 38 - 126 U/L   Total Bilirubin 1.4 (H) 0.3 - 1.2 mg/dL   GFR calc non Af Amer 20 (L) >60 mL/min   GFR calc Af Amer 23 (L) >60 mL/min   Anion gap 16 (H) 5 - 15     Comment: Performed at [facility], [address], [place], [place] [address]  Ethanol     Status: None   Collection Time: 09/25/18  4:06 PM  Result Value Ref Range   Alcohol, Ethyl (B) <10 <10 mg/dL    Comment: (NOTE) Lowest detectable limit for serum alcohol is 10 mg/dL. For medical purposes only. Performed at [facility], [address], [place], [place] [address]   Salicylate level     Status: None   Collection Time: 09/25/18  4:06 PM  Result Value Ref Range   Salicylate Lvl <7.0 2.8 - 30.0 mg/dL    Comment: Performed at [facility] Lab, [address], [place], [place] [address]  Acetaminophen level     Status: Abnormal   Collection Time: 09/25/18  4:06 PM  Result Value Ref Range   Acetaminophen (Tylenol), Serum <10 (L) 10 - 30 ug/mL    Comment: (NOTE) Therapeutic concentrations vary significantly. A range of 10-30 ug/mL  may be an effective concentration for many patients. However, some  are best treated at concentrations outside of this range. Acetaminophen concentrations >150 ug/mL at 4 hours after ingestion  and >50 ug/mL at 12 hours after ingestion are often associated with  toxic reactions. Performed at [facility] Lab, [address] [place], [place] [address]   Troponin I (High Sensitivity)     Status: Abnormal   Collection Time: 09/25/18  4:06 PM  Result Value Ref Range   Troponin I (High Sensitivity) 248 (HH) <18 ng/L    Comment: CRITICAL RESULT CALLED TO, READ BACK BY AND VERIFIED WITH: H DOOLEY RN AT 1714 ON [id number] BY K FORSYTH (NOTE) Elevated high sensitivity troponin I (hsTnI) values and  significant  changes across serial measurements may suggest ACS but many other  chronic and acute conditions are known to elevate hsTnI results.  Refer to the Links section for chest pain algorithms and additional  guidance. Performed at [facility] Lab, [address] [place], [place] [address]   CK     Status: Abnormal   Collection Time:  09/25/18  4:06 PM  Result Value Ref Range   Total CK 7,827 (H) 49 - 397 U/L    Comment: RESULTS CONFIRMED BY MANUAL DILUTION Performed at [facility], [address] [place], [place] [address]   POC CBG, ED     Status: Abnormal   Collection Time: 09/25/18  4:07 PM  Result Value Ref Range   Glucose-Capillary 121 (H) 70 - 99 mg/dL  POCT I-Stat EG7     Status: Abnormal   Collection Time: 09/25/18  4:22 PM  Result Value Ref Range   pH, Ven 7.340 7.250 - 7.430   pCO2, Ven 37.4 (L) 44.0 - 60.0 mmHg   pO2, Ven 171.0 (H) 32.0 - 45.0 mmHg   Bicarbonate 20.2 20.0 - 28.0 mmol/L   TCO2 21 (L) 22 - 32 mmol/L   O2 Saturation 99.0 %   Acid-base deficit 5.0 (H) 0.0 - 2.0 mmol/L   Sodium 141 135 - 145 mmol/L   Potassium 4.6 3.5 - 5.1 mmol/L   Calcium, Ion 1.14 (L) 1.15 - 1.40 mmol/L   HCT 44.0 39.0 - 52.0 %   Hemoglobin 15.0 13.0 - 17.0 g/dL   Patient temperature HIDE    Sample type VENOUS   Urinalysis, Routine w reflex microscopic     Status: Abnormal   Collection Time: 09/25/18  5:58 PM  Result Value Ref Range   Color, Urine YELLOW YELLOW   APPearance HAZY (A) CLEAR   Specific Gravity, Urine 1.020 1.005 - 1.030   pH 5.0 5.0 - 8.0   Glucose, UA NEGATIVE NEGATIVE mg/dL   Hgb urine dipstick LARGE (A) NEGATIVE   Bilirubin Urine NEGATIVE NEGATIVE   Ketones, ur 5 (A) NEGATIVE mg/dL   Protein, ur 30 (A) NEGATIVE mg/dL   Nitrite NEGATIVE NEGATIVE   Leukocytes,Ua NEGATIVE NEGATIVE   RBC / HPF 0-5 0 - 5 RBC/hpf   WBC, UA 0-5 0 - 5 WBC/hpf   Bacteria, UA RARE (A) NONE SEEN   Squamous Epithelial / LPF 0-5 0 - 5   Mucus PRESENT    Hyaline Casts, UA PRESENT     Comment: Performed at [facility] [place] [facility] Lab, [address] [place], [place] [address]  Rapid urine drug screen (hospital performed)     Status: Abnormal   Collection Time: 09/25/18  5:58 PM  Result Value Ref Range   Opiates NONE DETECTED NONE DETECTED   Cocaine POSITIVE (A) NONE DETECTED   Benzodiazepines POSITIVE (A) NONE  DETECTED   Amphetamines NONE DETECTED NONE DETECTED   Tetrahydrocannabinol NONE DETECTED NONE DETECTED   Barbiturates NONE DETECTED NONE DETECTED    Comment: (NOTE) DRUG SCREEN FOR MEDICAL PURPOSES ONLY.  IF CONFIRMATION IS NEEDED FOR ANY PURPOSE, NOTIFY LAB WITHIN 5 DAYS. LOWEST DETECTABLE LIMITS FOR URINE DRUG SCREEN Drug Class                     Cutoff (ng/mL) Amphetamine and metabolites    1000 Barbiturate and metabolites    200 Benzodiazepine                 200 Tricyclics and  metabolites     300 Opiates and metabolites        300 Cocaine and metabolites        300 THC                            50 Performed at [facility] Lab, [address], [place], [place] [address]   SARS CORONAVIRUS 2 (TAT 6-12 HRS) Nasal Swab Aptima Multi Swab     Status: None   Collection Time: 09/25/18  5:58 PM   Specimen: Aptima Multi Swab; Nasal Swab  Result Value Ref Range   SARS Coronavirus 2 NEGATIVE NEGATIVE    Comment: (NOTE) SARS-CoV-2 target nucleic acids are NOT DETECTED. The SARS-CoV-2 RNA is generally detectable in upper and lower respiratory specimens during the acute phase of infection. Negative results do not preclude SARS-CoV-2 infection, do not rule out co-infections with other pathogens, and should not be used as the sole basis for treatment or other patient management decisions. Negative results must be combined with clinical observations, patient history, and epidemiological information. The expected result is Negative. Fact Sheet for Patients: HairSlick.no Fact Sheet for Healthcare Providers: quierodirigir.com This test is not yet approved or cleared by the [place] FDA and  has been authorized for detection and/or diagnosis of SARS-CoV-2 by FDA under an Emergency Use Authorization (EUA). This EUA will remain  in effect (meaning this test can be used) for the duration of the COVID-19 declaration under Section  56 4(b)(1) of the Act, 21 U.S.C. section 360bbb-3(b)(1), unless the authorization is terminated or revoked sooner. Performed at [facility], [address] [place], [place] [address]   Troponin I (High Sensitivity)     Status: Abnormal   Collection Time: 09/25/18  6:12 PM  Result Value Ref Range   Troponin I (High Sensitivity) 247 (HH) <18 ng/L    Comment: CRITICAL VALUE NOTED.  VALUE IS CONSISTENT WITH PREVIOUSLY REPORTED AND CALLED VALUE. (NOTE) Elevated high sensitivity troponin I (hsTnI) values and significant  changes across serial measurements may suggest ACS but many other  chronic and acute conditions are known to elevate hsTnI results.  Refer to the Links section for chest pain algorithms and additional  guidance. Performed at [facility] Lab, [address], [place], [place] [address]   Magnesium     Status: Abnormal   Collection Time: 09/25/18 10:22 PM  Result Value Ref Range   Magnesium 2.6 (H) 1.7 - 2.4 mg/dL    Comment: Performed at [facility] Lab, [address], [place], [place] [address]  Phosphorus     Status: None   Collection Time: 09/25/18 10:22 PM  Result Value Ref Range   Phosphorus 4.4 2.5 - 4.6 mg/dL    Comment: Performed at [facility], [address], [place], [place] [address]  Comprehensive metabolic panel     Status: Abnormal   Collection Time: 09/26/18  6:02 AM  Result Value Ref Range   Sodium 141 135 - 145 mmol/L   Potassium 3.8 3.5 - 5.1 mmol/L    Comment: NO VISIBLE HEMOLYSIS   Chloride 102 98 - 111 mmol/L   CO2 31 22 - 32 mmol/L   Glucose, Bld 108 (H) 70 - 99 mg/dL   BUN 27 (H) 6 - 20 mg/dL   Creatinine, Ser 9.62 (H) 0.61 - 1.24 mg/dL    Comment: DELTA CHECK NOTED   Calcium 8.4 (L) 8.9 -  10.3 mg/dL   Total Protein 6.0 (L) 6.5 - 8.1 g/dL   Albumin 3.4 (L) 3.5 - 5.0 g/dL   AST 207 (H) 15 - 41 U/L   ALT 43 0 - 44 U/L   Alkaline Phosphatase 48 38 - 126 U/L   Total Bilirubin 1.9 (H) 0.3 - 1.2 mg/dL   GFR calc non Af  Amer 53 (L) >60 mL/min   GFR calc Af Amer >60 >60 mL/min   Anion gap 8 5 - 15    Comment: Performed at [place] [address], [place], [place] [address]  CK     Status: Abnormal   Collection Time: 09/26/18  8:29 AM  Result Value Ref Range   Total CK 10,935 (H) 49 - 397 U/L    Comment: RESULTS CONFIRMED BY MANUAL DILUTION Performed at [facility], [address], [place], [place] [address]   Troponin I (High Sensitivity)     Status: Abnormal   Collection Time: 09/26/18  8:29 AM  Result Value Ref Range   Troponin I (High Sensitivity) 173 (HH) <18 ng/L    Comment: CRITICAL VALUE NOTED.  VALUE IS CONSISTENT WITH PREVIOUSLY REPORTED AND CALLED VALUE. (NOTE) Elevated high sensitivity troponin I (hsTnI) values and significant  changes across serial measurements may suggest ACS but many other  chronic and acute conditions are known to elevate hsTnI results.  Refer to the Links section for chest pain algorithms and additional  guidance. Performed at [facility] Lab, [place] [address], [place], [place] [address]     Medications:  Current Facility-Administered Medications  Medication Dose Route Frequency Provider Last Rate Last Dose  . aspirin EC tablet 81 mg  81 mg Oral Daily Dana Allan I, MD   81 mg at 09/26/18 0942  . folic acid (FOLVITE) tablet 1 mg  1 mg Oral Daily Dana Allan I, MD   1 mg at 09/26/18 0943  . fondaparinux (ARIXTRA) injection 2.5 mg  2.5 mg Subcutaneous Q24H Dana Allan I, MD   2.5 mg at 09/26/18 0057  . LORazepam (ATIVAN) tablet 1 mg  1 mg Oral Q6H PRN Bonnell Public, MD       Or  . LORazepam (ATIVAN) injection 1 mg  1 mg Intravenous Q6H PRN Dana Allan I, MD      . multivitamin with minerals tablet 1 tablet  1 tablet Oral Daily Dana Allan I, MD   1 tablet at 09/26/18 [phone]  . olopatadine (PATANOL) 0.1 % ophthalmic solution 1 drop  1 drop Both Eyes BID Dana Allan I, MD   1 drop at 09/26/18 0057  . sodium  bicarbonate 150 mEq in dextrose 5% 1000 mL infusion  150 mEq Intravenous Continuous Hongalgi, Anand D, MD      . thiamine (VITAMIN B-1) tablet 100 mg  100 mg Oral Daily Dana Allan I, MD   100 mg at 09/26/18 8657   Or  . thiamine (B-1) injection 100 mg  100 mg Intravenous Daily Bonnell Public, MD        Musculoskeletal: Strength & Muscle Tone: No atrophy noted. Gait & Station: UTA since patient is lying in bed. Patient leans: N/A  Psychiatric Specialty Exam: Physical Exam  Nursing note and vitals reviewed. Constitutional: He is oriented to person, place, and time. He appears well-developed and well-nourished.  HENT:  Head: Normocephalic and atraumatic.  Neck: Normal range of motion.  Respiratory: Effort normal.  Musculoskeletal: Normal range of motion.  Neurological:  He is alert and oriented to person, place, and time.  Psychiatric: He has a normal mood and affect. His speech is normal and behavior is normal. Judgment and thought content normal. Cognition and memory are normal.    Review of Systems  Gastrointestinal: Negative for constipation, diarrhea, nausea and vomiting.  Musculoskeletal: Positive for back pain.       Left leg pain  Psychiatric/Behavioral: Positive for substance abuse. Negative for depression, hallucinations and suicidal ideas.  All other systems reviewed and are negative.   Blood pressure 114/71, pulse 63, temperature 98.2 F (36.8 C), temperature source Oral, resp. rate 19, height 6\' 2"  (1.88 m), weight 94.1 kg, SpO2 99 %.Body mass index is 26.64 kg/m.  General Appearance: Fairly Groomed, middle aged, African American male who is lying in bed. NAD.   Eye Contact:  Good  Speech:  Clear and Coherent and Normal Rate  Volume:  Normal  Mood:  Euthymic  Affect:  Appropriate and Congruent  Thought Process:  Goal Directed, Linear and Descriptions of Associations: Intact  Orientation:  Full (Time, Place, and Person)  Thought Content:  Logical   Suicidal Thoughts:  No  Homicidal Thoughts:  No  Memory:  Immediate;   Good Recent;   Good Remote;   Good  Judgement:  Good  Insight:  Good  Psychomotor Activity:  Normal  Concentration:  Concentration: Good and Attention Span: Good  Recall:  Good  Fund of Knowledge:  Good  Language:  Good  Akathisia:  No  Handed:  Right  AIMS (if indicated):   N/A  Assets:  Communication Skills Housing Intimacy Resilience Social Support  ADL's:  Intact  Cognition:  WNL  Sleep:   Okay   Assessment:  Brett Salinas is 48 y.o. male who was admitted with AKI likely secondary to polysubstance abuse (Percocet, heroin, ecstasy and alcohol) and associated with rhabdomyolysis and dehydration. Patient denies SI and does not remember making suicidal statements. He denies HI or AVH. He does not appear to be responding to internal stimuli. Wife is at bedside and does not voice safety concerns. He has a history of problematic substance abuse but minimizes his use and declines substance abuse treatment resources. He does not warrant inpatient psychiatric hospitalization at this time.   Treatment Plan Summary: -Continue psychotropic medications as prescribed.  -EKG reviewed and QTc 458 on 8/25. Please closely monitor when starting or increasing QTc prolonging agents.  -Patient should continue to follow up with [facility] for medication management.  -Psychiatry will sign off on patient at this time. Please consult psychiatry again as needed.    Disposition: No evidence of imminent risk to self or others at present.   Patient does not meet criteria for psychiatric inpatient admission.  This service was provided via telemedicine using a 2-way, interactive audio and video technology.  Names of all persons participating in this telemedicine service and their role in this encounter. Name: Juanetta BeetsJacqueline Chanya Chrisley, DO Role: Psychiatrist   Name: [facility] Schryver Role: Patient     Cherly BeachJacqueline J  Kanye Depree, DO 09/26/2018 12:40 PM

## 2018-09-26 NOTE — Progress Notes (Signed)
PROGRESS NOTE   Brett Salinas  UUE:[id number]    DOB: [birth year]    DOA: 09/25/2018  PCP: Patient, No Pcp Per   I have briefly reviewed patients previous medical records in [facility].  Chief Complaint  Patient presents with  . Drug Overdose    Brief Narrative:  48 year old male with PMH of schizoaffective/bipolar disorder, NSTEMI, CAD status post PCI, chronic diastolic CHF, HTN, HLD, polysubstance abuse including cocaine, [employer] was called to patient's hotel room due to agitation and aggression after reportedly consuming alcohol, cocaine, Percocet, heroin and ecstasy, was given Versed and Haldol onsite and admitted for acute toxic metabolic encephalopathy, acute renal failure, rhabdomyolysis, elevated troponin.  Some concern regarding suicidal ideations and hence was IVC by EDP.  Placed psychiatric consultation 8/26.  Improving.   Assessment & Plan:   Active Problems:   AKI (acute kidney injury) (HCC)   Acute renal failure Likely secondary to polysubstance abuse and associated rhabdomyolysis and dehydration. Creatinine on admission: 3.4. Treating with IV sodium bicarbonate drip.  Improving and creatinine down to 1.52. Renal ultrasound without hydronephrosis.  Avoid nephrotoxins. Follow BMP in a.m.  Rhabdomyolysis Secondary to cocaine abuse. CK on admission: 7827, increased today to 10, 935. No hyperkalemia or metabolic acidosis. Continue IV bicarbonate drip and follow CK in a.m.  Elevated high-sensitivity troponin No anginal symptoms 248 > 247 > 173 Likely related to cocaine abuse and rhabdomyolysis and associated demand ischemia.  Mild LFT abnormality AST 170 > 207.  Due to rhabdomyolysis.  Follow CMP.  Acute toxic metabolic encephalopathy Secondary to polysubstance abuse/overdose. Improved and may have even resolved.  Closely monitor.  Polysubstance abuse (cocaine) UDS positive for benzodiazepines and cocaine.  BAL and salicylate level negative. History  of polysubstance abuse as noted above. Cessation counseling to be done. Tachycardia and hypertension on admission were likely related to his substance abuse, improved or resolved now  Bipolar disorder/schizoaffective disorder/cannot rule out suicidal ideations on admission Patient not forthcoming and evasive to questions regarding suicidal ideations.  Denies homicidal ideations or audiovisual hallucinations. Continue 1: 1 safety sitter and suicide precautions. Psychiatry consulted. Patient reportedly follows with family services of the [place]. Unsure if patient compliant with his home meds.  Requested pharmacy to reconcile his meds.  CAD status post PCI No anginal symptoms. Continue aspirin.  Discontinued Toprol-XL due to ongoing cocaine abuse and risks associated with it.  Essential hypertension Controlled.   DVT prophylaxis: Fondaparinux Code Status: Full Family Communication: None at bedside Disposition: DC home pending clinical improvement, may take another 1 to 2 days.   Consultants:  Psychiatry-pending  Procedures:  None  Antimicrobials:  None   Subjective: Patient sleepy but easily arousable and seems oriented.  Reports low back pain and left thigh pain.  No chest pain or dyspnea.  Evasive regarding questions about suicidal ideations, "want to be left alone".  Denies homicidal ideations or audiovisual hallucinations.  As per RN, no acute issues noted.  Objective:  Vitals:   09/25/18 2100 09/25/18 2115 09/25/18 2157 09/26/18 0613  BP: (!) 130/101 (!) 127/96 132/90 104/68  Pulse:   79 77  Resp: 14 (!) 23 17 19   Temp:   97.9 F (36.6 C) 97.7 F (36.5 C)  TempSrc:   Oral   SpO2:   100% 99%  Weight:   94.1 kg   Height:   6\' 2"  (1.88 m)     Examination:  General exam: Pleasant young male, well built and nourished lying comfortably supine in bed.  Oral mucosa dry. Respiratory system: Clear to auscultation. Respiratory effort normal. Cardiovascular system:  S1 & S2 heard, RRR. No JVD, murmurs, rubs, gallops or clicks. No pedal edema.  Telemetry personally reviewed: Sinus rhythm. Gastrointestinal system: Abdomen is nondistended, soft and nontender. No organomegaly or masses felt. Normal bowel sounds heard. Central nervous system: Alert and oriented. No focal neurological deficits. Extremities: Symmetric 5 x 5 power. Skin: No rashes, lesions or ulcers Psychiatry: Judgement and insight appear normal. Mood & affect flat.     Data Reviewed: I have personally reviewed following labs and imaging studies  CBC: Recent Labs  Lab 09/25/18 1606 09/25/18 1622  WBC 9.6  --   NEUTROABS 7.7  --   HGB 14.2 15.0  HCT 43.2 44.0  MCV 88.3  --   PLT 230  --    Basic Metabolic Panel: Recent Labs  Lab 09/25/18 1606 09/25/18 1622 09/25/18 2222 09/26/18 0602  NA 141 141  --  141  K 4.6 4.6  --  3.8  CL 106  --   --  102  CO2 19*  --   --  31  GLUCOSE 126*  --   --  108*  BUN 45*  --   --  27*  CREATININE 3.40*  --   --  1.52*  CALCIUM 9.4  --   --  8.4*  MG  --   --  2.6*  --   PHOS  --   --  4.4  --    Liver Function Tests: Recent Labs  Lab 09/25/18 1606 09/26/18 0602  AST 170* 207*  ALT 36 43  ALKPHOS 55 48  BILITOT 1.4* 1.9*  PROT 7.2 6.0*  ALBUMIN 4.5 3.4*    Cardiac Enzymes: Recent Labs  Lab 09/25/18 1606 09/26/18 0829  CKTOTAL 7,827* 10,935*    CBG: Recent Labs  Lab 09/25/18 1607  GLUCAP 121*    Recent Results (from the past 240 hour(s))  SARS CORONAVIRUS 2 (TAT 6-12 HRS) Nasal Swab Aptima Multi Swab     Status: None   Collection Time: 09/25/18  5:58 PM   Specimen: Aptima Multi Swab; Nasal Swab  Result Value Ref Range Status   SARS Coronavirus 2 NEGATIVE NEGATIVE Final    Comment: (NOTE) SARS-CoV-2 target nucleic acids are NOT DETECTED. The SARS-CoV-2 RNA is generally detectable in upper and lower respiratory specimens during the acute phase of infection. Negative results do not preclude SARS-CoV-2 infection,  do not rule out co-infections with other pathogens, and should not be used as the sole basis for treatment or other patient management decisions. Negative results must be combined with clinical observations, patient history, and epidemiological information. The expected result is Negative. Fact Sheet for Patients: HairSlick.nohttps://www.fda.gov/media/138098/download Fact Sheet for Healthcare Providers: quierodirigir.comhttps://www.fda.gov/media/138095/download This test is not yet approved or cleared by the [place] FDA and  has been authorized for detection and/or diagnosis of SARS-CoV-2 by FDA under an Emergency Use Authorization (EUA). This EUA will remain  in effect (meaning this test can be used) for the duration of the COVID-19 declaration under Section 56 4(b)(1) of the Act, 21 U.S.C. section 360bbb-3(b)(1), unless the authorization is terminated or revoked sooner. Performed at [facility], [address] [place], [place] [address]          Radiology Studies: [place] Renal  Result Date: 09/26/2018 CLINICAL DATA:  Acute kidney injury EXAM: RENAL / URINARY TRACT ULTRASOUND COMPLETE COMPARISON:  None. FINDINGS: Right Kidney: Renal measurements: 9.9 x 4.4 x  5.9 cm = volume: 135.9 mL . Echogenicity within normal limits. No mass or hydronephrosis visualized. Left Kidney: Renal measurements: 11 x 6.5 x 4.2 cm = volume: 158.4 mL. Echogenicity within normal limits. No mass or hydronephrosis visualized. Bladder: Appears normal for degree of bladder distention. IMPRESSION: Negative renal ultrasound Electronically Signed   By: Donavan Foil M.D.   On: 09/26/2018 00:00   Dg Chest Portable 1 View  Result Date: 09/25/2018 CLINICAL DATA:  Elevated troponin level. EXAM: PORTABLE CHEST 1 VIEW COMPARISON:  Radiographs of September 14, 2017. FINDINGS: The heart size and mediastinal contours are within normal limits. Both lungs are clear. The visualized skeletal structures are unremarkable. IMPRESSION: No active  disease. Electronically Signed   By: Marijo Conception M.D.   On: 09/25/2018 18:16        Scheduled Meds: . aspirin EC  81 mg Oral Daily  . folic acid  1 mg Oral Daily  . fondaparinux (ARIXTRA) injection  2.5 mg Subcutaneous Q24H  . multivitamin with minerals  1 tablet Oral Daily  . olopatadine  1 drop Both Eyes BID  . thiamine  100 mg Oral Daily   Or  . thiamine  100 mg Intravenous Daily   Continuous Infusions: . sodium bicarbonate 150 mEq in dextrose 5% 1000 mL 150 mEq (09/26/18 0812)     LOS: 1 day     Vernell Leep, MD, FACP, [facility]. [facility]  To contact the attending provider between 7A-7P or the covering provider during after hours 7P-7A, please log into the web site [email or web address] and access using universal [place] password for that web site. If you do not have the password, please call the hospital operator.  09/26/2018, 11:09 AM

## 2018-09-26 NOTE — Progress Notes (Signed)
Pt states he does not need detox or psychological services, "I don't do drugs when I am not in [place]." when he leasves here, he said he may go to jail, but he is unsure.  Pt states he wants his wife to visit and get his truck keys (which are in his belong bag in [employer].

## 2018-09-27 DIAGNOSIS — F1910 Other psychoactive substance abuse, uncomplicated: Secondary | ICD-10-CM

## 2018-09-27 LAB — COMPREHENSIVE METABOLIC PANEL
ALT: 41 U/L (ref 0–44)
AST: 155 U/L — ABNORMAL HIGH (ref 15–41)
Albumin: 3.2 g/dL — ABNORMAL LOW (ref 3.5–5.0)
Alkaline Phosphatase: 43 U/L (ref 38–126)
Anion gap: 8 (ref 5–15)
BUN: 14 mg/dL (ref 6–20)
CO2: 32 mmol/L (ref 22–32)
Calcium: 8.6 mg/dL — ABNORMAL LOW (ref 8.9–10.3)
Chloride: 97 mmol/L — ABNORMAL LOW (ref 98–111)
Creatinine, Ser: 1.03 mg/dL (ref 0.61–1.24)
GFR calc Af Amer: >60 mL/min (ref >60)
GFR calc non Af Amer: >60 mL/min (ref >60)
Glucose, Bld: 104 mg/dL — ABNORMAL HIGH (ref 70–99)
Potassium: 3.9 mmol/L (ref 3.5–5.1)
Sodium: 137 mmol/L (ref 135–145)
Total Bilirubin: 1.9 mg/dL — ABNORMAL HIGH (ref 0.3–1.2)
Total Protein: 5.7 g/dL — ABNORMAL LOW (ref 6.5–8.1)

## 2018-09-27 LAB — CK: Total CK: 5894 U/L — ABNORMAL HIGH (ref 49–397)

## 2018-09-27 MED ORDER — THIAMINE HCL 100 MG PO TABS: 100.0000 mg | ORAL_TABLET | Freq: Every day | ORAL | 0 refills | Status: DC

## 2018-09-27 MED ORDER — ASPIRIN EC 81 MG PO TBEC: 81.0000 mg | DELAYED_RELEASE_TABLET | Freq: Every day | ORAL | 0 refills | Status: DC

## 2018-09-27 MED ORDER — FOLIC ACID 1 MG PO TABS: 1.0000 mg | ORAL_TABLET | Freq: Every day | ORAL | 0 refills | Status: DC

## 2018-09-27 MED ORDER — ADULT MULTIVITAMIN W/MINERALS CH: 1.0000 | ORAL_TABLET | Freq: Every day | ORAL | Status: DC

## 2018-09-27 MED FILL — FOLIC ACID 1 MG TABS: 1 | 30 days supply | Qty: 30 | Fill #0

## 2018-09-27 NOTE — Progress Notes (Signed)
Nsg Discharge Note  Admit Date:  09/25/2018 Discharge date: 09/27/2018   [facility] to be D/C'd Home per MD order.  AVS completed.  Copy for chart, and copy for patient signed, and dated. Patient/caregiver able to verbalize understanding.  Discharge Medication: Allergies as of 09/27/2018      Reactions   Catfish [fish Allergy] Anaphylaxis   Heparin Itching   Full-body itching inc throat   Pork-derived Products Itching   Burning/itching      Medication List    STOP taking these medications   albuterol 108 (90 Base) MCG/ACT inhaler Commonly known as: Ventolin HFA   ARIPiprazole 2 MG tablet Commonly known as: Abilify   ARIPiprazole ER 400 MG Srer injection Commonly known as: ABILIFY MAINTENA   atorvastatin 20 MG tablet Commonly known as: LIPITOR   cetirizine 10 MG tablet Commonly known as: ZYRTEC   diclofenac sodium 1 % Gel Commonly known as: Voltaren   FLUoxetine 20 MG capsule Commonly known as: PROZAC   fluticasone 50 MCG/ACT nasal spray Commonly known as: FLONASE   gabapentin 600 MG tablet Commonly known as: NEURONTIN   guaiFENesin-codeine 100-10 MG/5ML syrup Commonly known as: Cheratussin AC   nitroGLYCERIN 0.4 MG SL tablet Commonly known as: NITROSTAT   terbinafine 1 % cream Commonly known as: LAMISIL   trimethoprim-polymyxin b ophthalmic solution Commonly known as: Polytrim     TAKE these medications   aspirin EC 81 MG tablet Take 1 tablet (81 mg total) by mouth daily.   folic acid 1 MG tablet Commonly known as: FOLVITE Take 1 tablet (1 mg total) by mouth daily. Start taking on: September 28, 2018   mirtazapine 15 MG tablet Commonly known as: REMERON Take 30 mg by mouth daily as needed (sleep).   multivitamin with minerals Tabs tablet Take 1 tablet by mouth daily. Start taking on: September 28, 2018 What changed: Another medication with the same name was removed. Continue taking this medication, and follow the directions you see  here.   OSTEO BI-FLEX JOINT SHIELD PO Take 1 tablet by mouth daily.   thiamine 100 MG tablet Take 1 tablet (100 mg total) by mouth daily. Start taking on: September 28, 2018       Discharge Assessment: Vitals:   09/27/18 0049 09/27/18 0621  BP:  91/66  Pulse:  62  Resp: 14 18  Temp:  98.4 F (36.9 C)  SpO2:  99%   Skin clean, dry and intact without evidence of skin break down, no evidence of skin tears noted. IV catheter discontinued intact. Site without signs and symptoms of complications - no redness or edema noted at insertion site, patient denies c/o pain - only slight tenderness at site.  Dressing with slight pressure applied.  D/c Instructions-Education: Discharge instructions given to patient/family with verbalized understanding. D/c education completed with patient/family including follow up instructions, medication list, d/c activities limitations if indicated, with other d/c instructions as indicated by MD - patient able to verbalize understanding, all questions fully answered. Patient instructed to return to ED, call 911, or call MD for any changes in condition.  Patient escorted via [place], and D/C home via private auto.  Erasmo Leventhal, RN 09/27/2018 1:31 PM

## 2018-09-27 NOTE — Discharge Instructions (Signed)

## 2018-09-27 NOTE — Discharge Summary (Signed)
Physician Discharge Summary  Brett Salinas [facility] ZOX:[id number] DOB: 06/09/1970  PCP: Patient, No Pcp Per  Admitted from: Home Discharged to: Home  Admit date: 09/25/2018 Discharge date: 09/27/2018  Recommendations for Outpatient Follow-up:   Follow-up Information    [facility]. Go on 10/02/2018.   Why: 1:50 pm with Georgian CoAngela McClung.  To be seen with repeat labs (CBC, CMP & CK). Contact information: [address] [place] [phone]       [facility] Schedule an appointment as soon as possible for a visit.   Specialty: Pension scheme managerrofessional Counselor Contact information: [facility] [address] [place] [address] [phone]        Lars MassonNelson, Katarina H, MD. Schedule an appointment as soon as possible for a visit.   Specialty: Cardiology Contact information: [address] [place] [phone]            Home Health: None Equipment/Devices: None  Discharge Condition: Improved and stable CODE STATUS: Full Diet recommendation: Heart healthy diet  Discharge Diagnoses:  Principal Problem:   Polysubstance abuse (HCC) Active Problems:   AKI (acute kidney injury) (HCC)   Brief Summary: 48 year old male with PMH of schizoaffective/bipolar disorder, NSTEMI, CAD status post PCI, chronic diastolic CHF, HTN, HLD, polysubstance abuse including cocaine and medication noncompliance, [employer] was called to patient's hotel room due to agitation and aggression after reportedly consuming alcohol, cocaine, Percocet, heroin and ecstasy, was given Versed and Haldol onsite and admitted for acute toxic metabolic encephalopathy, acute renal failure, rhabdomyolysis, elevated troponin.  Some concern regarding suicidal ideations and hence was IVC by EDP.  Psychiatry consulted   Assessment & Plan:  Acute renal failure  Likely secondary to  polysubstance abuse and associated rhabdomyolysis and dehydration.  Creatinine on admission: 3.4.  Treated with IV sodium bicarbonate drip.  Renal ultrasound without hydronephrosis.  Avoid nephrotoxins.  Resolved.  Creatinine 1.03 on date of discharge.  Rhabdomyolysis  Secondary to cocaine abuse.  CK on admission: 7827, increased to 10, 935 on 8/26.  No hyperkalemia or metabolic acidosis.  Treated with IV bicarbonate infusion.  CK down to 5894 on day of discharge.  Strongly advised additional day of continued IV fluids due to ongoing mild to moderate left thigh pain so asked to treat rhabdomyolysis further and follow-up CK tomorrow.  However patient insists on being discharged home today.  Recommended adequate oral hydration and avoid stressful activity and he verbalized understanding.  Follow-up next week with repeat CK with new PCP.  Elevated high-sensitivity troponin  No anginal symptoms  248 > 247 > 173  Likely related to cocaine abuse and rhabdomyolysis and associated demand ischemia.  Mild LFT abnormality  AST 170 > 207 >155.  Due to rhabdomyolysis.  Improving. Follow CMP as outpatient.  Acute toxic metabolic encephalopathy  Secondary to polysubstance abuse/overdose.  Resolved.  Polysubstance abuse (cocaine)  UDS positive for benzodiazepines and cocaine.  BAL and salicylate level negative.  History of polysubstance abuse as noted above.  Cessation counseling done.  Tachycardia and hypertension on admission were likely related to his substance abuse, resolved.  Outpatient behavioral health follow-up.  As per psychiatric consultation yesterday, patient declined substance abuse treatment resources.  Bipolar disorder/schizoaffective disorder  Initially, patient was not forthcoming and evasive to questions regarding suicidal ideations.  Denies homicidal ideations or audiovisual hallucinations.  Briefly placed on 1: 1 safety sitter and suicide  precautions.  Psychiatry consulted.  As per psychiatry input, he denied SI and did not remember making suicidal statements.  He denied HI or AVH.  He does not seem to be responding to internal stimuli.  Spouse was at bedside during psychiatry evaluation and did not voice safety concerns.  There was no evidence of imminent risk to self or others and he did not warrant inpatient psychiatric hospitalization at this time.  [facility] reportedly [facility] with [facility].  Patient verbalizes that he only takes some psychiatric medications as needed such as Remeron,?  Risperdal and Latuda.  He indicates that he is usually stable and only gets auditory/visual hallucinations at times of drug abuse.  He is advised to follow-up with outpatient behavioral health for further management.  CAD status post PCI  No anginal symptoms.  Continue aspirin.  Discontinued Toprol-XL due to ongoing cocaine abuse and risks associated with it.  Patient has not been on beta-blockers as outpatient and noncompliant with statins and other prescription medications.  Essential hypertension  Controlled off of medications.  Consultants:  Psychiatry  Procedures:  None   Discharge Instructions  Discharge Instructions    Activity as tolerated - No restrictions   Complete by: As directed    Call MD for:  difficulty breathing, headache or visual disturbances   Complete by: As directed    Call MD for:  extreme fatigue   Complete by: As directed    Call MD for:  persistant dizziness or light-headedness   Complete by: As directed    Call MD for:  persistant nausea and vomiting   Complete by: As directed    Call MD for:  severe uncontrolled pain   Complete by: As directed    Call MD for:  temperature >100.4   Complete by: As directed    Diet - low sodium heart healthy   Complete by: As directed        Medication List    STOP taking these medications   albuterol 108 (90 Base) MCG/ACT  inhaler Commonly known as: Ventolin HFA   ARIPiprazole 2 MG tablet Commonly known as: Abilify   ARIPiprazole ER 400 MG Srer injection Commonly known as: ABILIFY MAINTENA   atorvastatin 20 MG tablet Commonly known as: LIPITOR   cetirizine 10 MG tablet Commonly known as: ZYRTEC   diclofenac sodium 1 % Gel Commonly known as: Voltaren   FLUoxetine 20 MG capsule Commonly known as: PROZAC   fluticasone 50 MCG/ACT nasal spray Commonly known as: FLONASE   gabapentin 600 MG tablet Commonly known as: NEURONTIN   guaiFENesin-codeine 100-10 MG/5ML syrup Commonly known as: Cheratussin AC   nitroGLYCERIN 0.4 MG SL tablet Commonly known as: NITROSTAT   terbinafine 1 % cream Commonly known as: LAMISIL   trimethoprim-polymyxin b ophthalmic solution Commonly known as: Polytrim     TAKE these medications   aspirin EC 81 MG tablet Take 1 tablet (81 mg total) by mouth daily.   folic acid 1 MG tablet Commonly known as: FOLVITE Take 1 tablet (1 mg total) by mouth daily. Start taking on: September 28, 2018   mirtazapine 15 MG tablet Commonly known as: REMERON Take 30 mg by mouth daily as needed (sleep).   multivitamin with minerals Tabs tablet Take 1 tablet by mouth daily. Start taking on: September 28, 2018 What changed: Another medication with the same name was removed. Continue taking this medication, and follow the directions you see here.   OSTEO BI-FLEX JOINT SHIELD PO Take 1 tablet by mouth daily.  thiamine 100 MG tablet Take 1 tablet (100 mg total) by mouth daily. Start taking on: September 28, 2018      Allergies  Allergen Reactions  . 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 Heparin Itching    Full-body itching inc throat  . Pork-Derived Products Itching    Burning/itching      Procedures/Studies: US Renal  Result Date: 09/26/2018 CLINICAL DATA:  Acute kidney injury EXAM: RENAL / URINARY TRACT ULTRASOUND COMPLETE COMPARISON:  None. FINDINGS: Right Kidney: Renal  measurements: 9.9 x 4.4 x 5.9 cm = volume: 135.9 mL . Echogenicity within normal limits. No mass or hydronephrosis visualized. Left Kidney: Renal measurements: 11 x 6.5 x 4.2 cm = volume: 158.4 mL. Echogenicity within normal limits. No mass or hydronephrosis visualized. Bladder: Appears normal for degree of bladder distention. IMPRESSION: Negative renal ultrasound Electronically Signed   By: Jasmine Pang M.D.   On: 09/26/2018 00:00   Dg Chest Portable 1 View  Result Date: 09/25/2018 CLINICAL DATA:  Elevated troponin level. EXAM: PORTABLE CHEST 1 VIEW COMPARISON:  Radiographs of September 14, 2017. FINDINGS: The heart size and mediastinal contours are within normal limits. Both lungs are clear. The visualized skeletal structures are unremarkable. IMPRESSION: No active disease. Electronically Signed   By: Lupita Raider M.D.   On: 09/25/2018 18:16      Subjective: Mild left anterior thigh pain, improving.  He feels that this may be due to altercation with the police on the day of admission and he may have strained something in his thigh.  No swelling.  No chest pain or dyspnea.  No SI, HI or AVH.  Patient interviewed and examined in the presence of safety sitter at bedside.  Discharge Exam:  Vitals:   09/26/18 2349 09/27/18 0000 09/27/18 0049 09/27/18 0621  BP:  103/74  91/66  Pulse:  (!) 57  62  Resp: 15 18 14 18   Temp:  98.4 F (36.9 C)  98.4 F (36.9 C)  TempSrc:  Oral  Oral  SpO2:  99%  99%  Weight:      Height:        General exam: Pleasant young male, well built and nourished lying comfortably supine in bed.  Oral mucosa moist Respiratory system: Clear to auscultation. Respiratory effort normal. Cardiovascular system: S1 & S2 heard, RRR. No JVD, murmurs, rubs, gallops or clicks. No pedal edema.  Telemetry personally reviewed: Sinus rhythm. Gastrointestinal system: Abdomen is nondistended, soft and nontender. No organomegaly or masses felt. Normal bowel sounds heard. Central nervous  system: Alert and oriented. No focal neurological deficits. Extremities: Symmetric 5 x 5 power. Skin: No rashes, lesions or ulcers Psychiatry: Judgement and insight appear normal. Mood & affect more interactive and appropriate today.     The results of significant diagnostics from this hospitalization (including imaging, microbiology, ancillary and laboratory) are listed below for reference.     Microbiology: Recent Results (from the past 240 hour(s))  SARS CORONAVIRUS 2 (TAT 6-12 HRS) Nasal Swab Aptima Multi Swab     Status: None   Collection Time: 09/25/18  5:58 PM   Specimen: Aptima Multi Swab; Nasal Swab  Result Value Ref Range Status   SARS Coronavirus 2 NEGATIVE NEGATIVE Final    Comment: (NOTE) SARS-CoV-2 target nucleic acids are NOT DETECTED. The SARS-CoV-2 RNA is generally detectable in upper and lower respiratory specimens during the acute phase of infection. Negative results do not preclude SARS-CoV-2 infection, do not rule out co-infections with other pathogens, and should not  be used as the sole basis for treatment or other patient management decisions. Negative results must be combined with clinical observations, patient history, and epidemiological information. The expected result is Negative. Fact Sheet for Patients: HairSlick.nohttps://www.fda.gov/media/138098/download Fact Sheet for Healthcare Providers: quierodirigir.comhttps://www.fda.gov/media/138095/download This test is not yet approved or cleared by the [place] FDA and  has been authorized for detection and/or diagnosis of SARS-CoV-2 by FDA under an Emergency Use Authorization (EUA). This EUA will remain  in effect (meaning this test can be used) for the duration of the COVID-19 declaration under Section 56 4(b)(1) of the Act, 21 U.S.C. section 360bbb-3(b)(1), unless the authorization is terminated or revoked sooner. Performed at [facility], [address] [place], [place] [address]      Labs: CBC: Recent Labs   Lab 09/25/18 1606 09/25/18 1622  WBC 9.6  --   NEUTROABS 7.7  --   HGB 14.2 15.0  HCT 43.2 44.0  MCV 88.3  --   PLT 230  --    Basic Metabolic Panel: Recent Labs  Lab 09/25/18 1606 09/25/18 1622 09/25/18 2222 09/26/18 0602 09/27/18 0317  NA 141 141  --  141 137  K 4.6 4.6  --  3.8 3.9  CL 106  --   --  102 97*  CO2 19*  --   --  31 32  GLUCOSE 126*  --   --  108* 104*  BUN 45*  --   --  27* 14  CREATININE 3.40*  --   --  1.52* 1.03  CALCIUM 9.4  --   --  8.4* 8.6*  MG  --   --  2.6*  --   --   PHOS  --   --  4.4  --   --    Liver Function Tests: Recent Labs  Lab 09/25/18 1606 09/26/18 0602 09/27/18 0317  AST 170* 207* 155*  ALT 36 43 41  ALKPHOS 55 48 43  BILITOT 1.4* 1.9* 1.9*  PROT 7.2 6.0* 5.7*  ALBUMIN 4.5 3.4* 3.2*   Cardiac Enzymes: Recent Labs  Lab 09/25/18 1606 09/26/18 0829 09/27/18 0317  CKTOTAL 7,827* 10,935* 5,894*   CBG: Recent Labs  Lab 09/25/18 1607  GLUCAP 121*   Urinalysis    Component Value Date/Time   COLORURINE YELLOW 09/25/2018 1758   APPEARANCEUR HAZY (A) 09/25/2018 1758   LABSPEC 1.020 09/25/2018 1758   PHURINE 5.0 09/25/2018 1758   GLUCOSEU NEGATIVE 09/25/2018 1758   HGBUR LARGE (A) 09/25/2018 1758   BILIRUBINUR NEGATIVE 09/25/2018 1758   KETONESUR 5 (A) 09/25/2018 1758   PROTEINUR 30 (A) 09/25/2018 1758   NITRITE NEGATIVE 09/25/2018 1758   LEUKOCYTESUR NEGATIVE 09/25/2018 1758      Time coordinating discharge: 25 minutes  SIGNED:  Marcellus ScottAnand Hongalgi, MD, FACP, [facility]. [facility]  To contact the attending provider between 7A-7P or the covering provider during after hours 7P-7A, please log into the web site [email or web address] and access using universal [place] password for that web site. If you do not have the password, please call the hospital operator.

## 2018-09-28 NOTE — Progress Notes (Deleted)
Patient ID: Brett Salinas, male   DOB: 1970-05-04, 48 y.o.   MRN: [id number]  After hospitalization 8/25-8/27    Brief Summary: 48 year old male with PMH of schizoaffective/bipolar disorder,NSTEMI,CAD status post PCI, chronic diastolic CHF, HTN, HLD, polysubstance abuse including cocaine and medication noncompliance, [employer] was called to patient's hotel room due to agitation and aggression after reportedly consuming alcohol, cocaine, Percocet, heroin and ecstasy, was given Versed and Haldol onsite and admitted for acute toxic metabolic encephalopathy, acute renal failure, rhabdomyolysis, elevated troponin. Some concern regarding suicidal ideations and hence was IVC by EDP.  Psychiatry consulted   Assessment & Plan:  Acute renal failure  Likely secondary to polysubstance abuse and associated rhabdomyolysis and dehydration.  Creatinine on admission: 3.4.  Treated with IV sodium bicarbonate drip.  Renal ultrasound without hydronephrosis. Avoid nephrotoxins.  Resolved.  Creatinine 1.03 on date of discharge.  Rhabdomyolysis  Secondary to cocaine abuse.  CK on admission: 7827, increased to 10, 935 on 8/26.  No hyperkalemia or metabolic acidosis.  Treated with IV bicarbonate infusion.  CK down to 5894 on day of discharge.  Strongly advised additional day of continued IV fluids due to ongoing mild to moderate left thigh pain so asked to treat rhabdomyolysis further and follow-up CK tomorrow.  However patient insists on being discharged home today.  Recommended adequate oral hydration and avoid stressful activity and he verbalized understanding.  Follow-up next week with repeat CK with new PCP.  Elevated high-sensitivity troponin  No anginal symptoms  248 > 247 > 173  Likely related to cocaine abuse and rhabdomyolysis and associated demand ischemia.  Mild LFT abnormality  AST 170 >207 >155. Due to rhabdomyolysis.  Improving.Follow CMP as outpatient.  Acute toxic  metabolic encephalopathy  Secondary to polysubstance abuse/overdose.  Resolved.  Polysubstance abuse (cocaine)  UDS positive for benzodiazepines and cocaine. BAL and salicylate level negative.  History of polysubstance abuse as noted above.  Cessation counseling done.  Tachycardia and hypertension on admission were likely related to his substance abuse, resolved.  Outpatient behavioral health follow-up.  As per psychiatric consultation yesterday, patient declined substance abuse treatment resources.  Bipolar disorder/schizoaffective disorder  Initially, patient was not forthcoming and evasive to questions regarding suicidal ideations. Denies homicidal ideations or audiovisual hallucinations.  Briefly placed on 1: 1 safety sitter and suicide precautions.  Psychiatry consulted.  As per psychiatry input, he denied SI and did not remember making suicidal statements.  He denied HI or AVH.  He does not seem to be responding to internal stimuli.  Spouse was at bedside during psychiatry evaluation and did not voice safety concerns.  There was no evidence of imminent risk to self or others and he did not warrant inpatient psychiatric hospitalization at this time.  [facility] reportedly [facility] with [facility].  Patient verbalizes that he only takes some psychiatric medications as needed such as Remeron,?  Risperdal and Latuda.  He indicates that he is usually stable and only gets auditory/visual hallucinations at times of drug abuse.  He is advised to follow-up with outpatient behavioral health for further management.  CAD status post PCI  No anginal symptoms.  Continue aspirin. Discontinued Toprol-XL due to ongoing cocaine abuse and risks associated with it.  Patient has not been on beta-blockers as outpatient and noncompliant with statins and other prescription medications.  Essential hypertension  Controlled off of medications.

## 2018-10-02 ENCOUNTER — Inpatient Hospital Stay: Payer: Self-pay

## 2018-11-20 IMAGING — DX DG KNEE 1-2V*R*
2 series · 2 of 2 positions shown · non-contrast
Comparison: None.

CLINICAL DATA: 45 y/o  M; anterior and medial knee pain.

EXAM:
RIGHT KNEE - 1-2 VIEW

[knee ap]
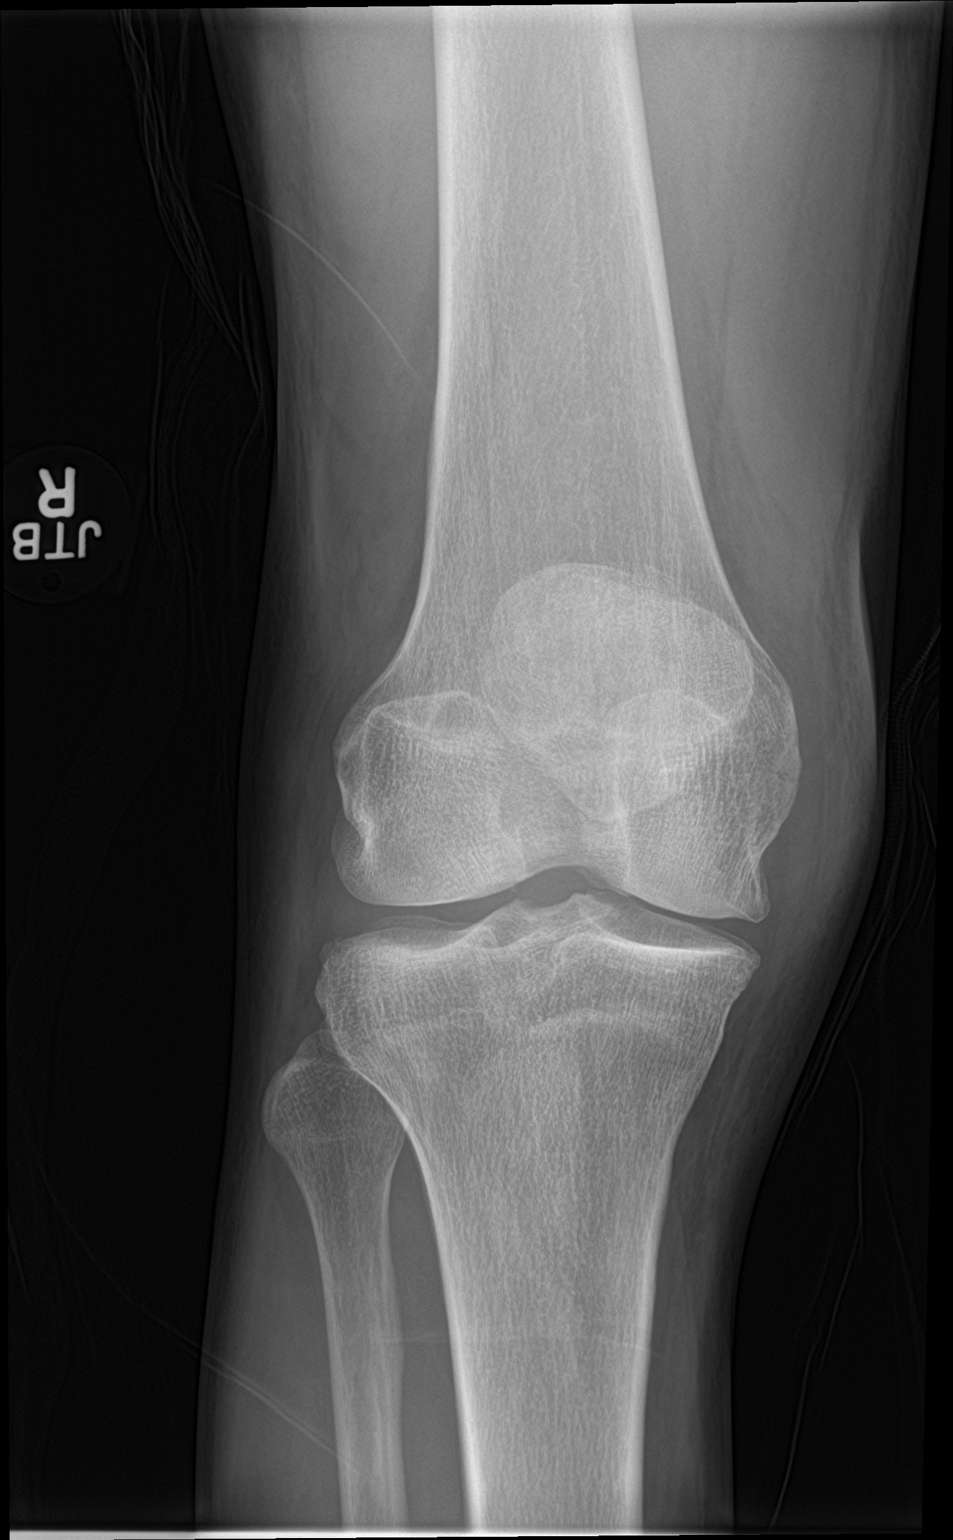

[knee lat]
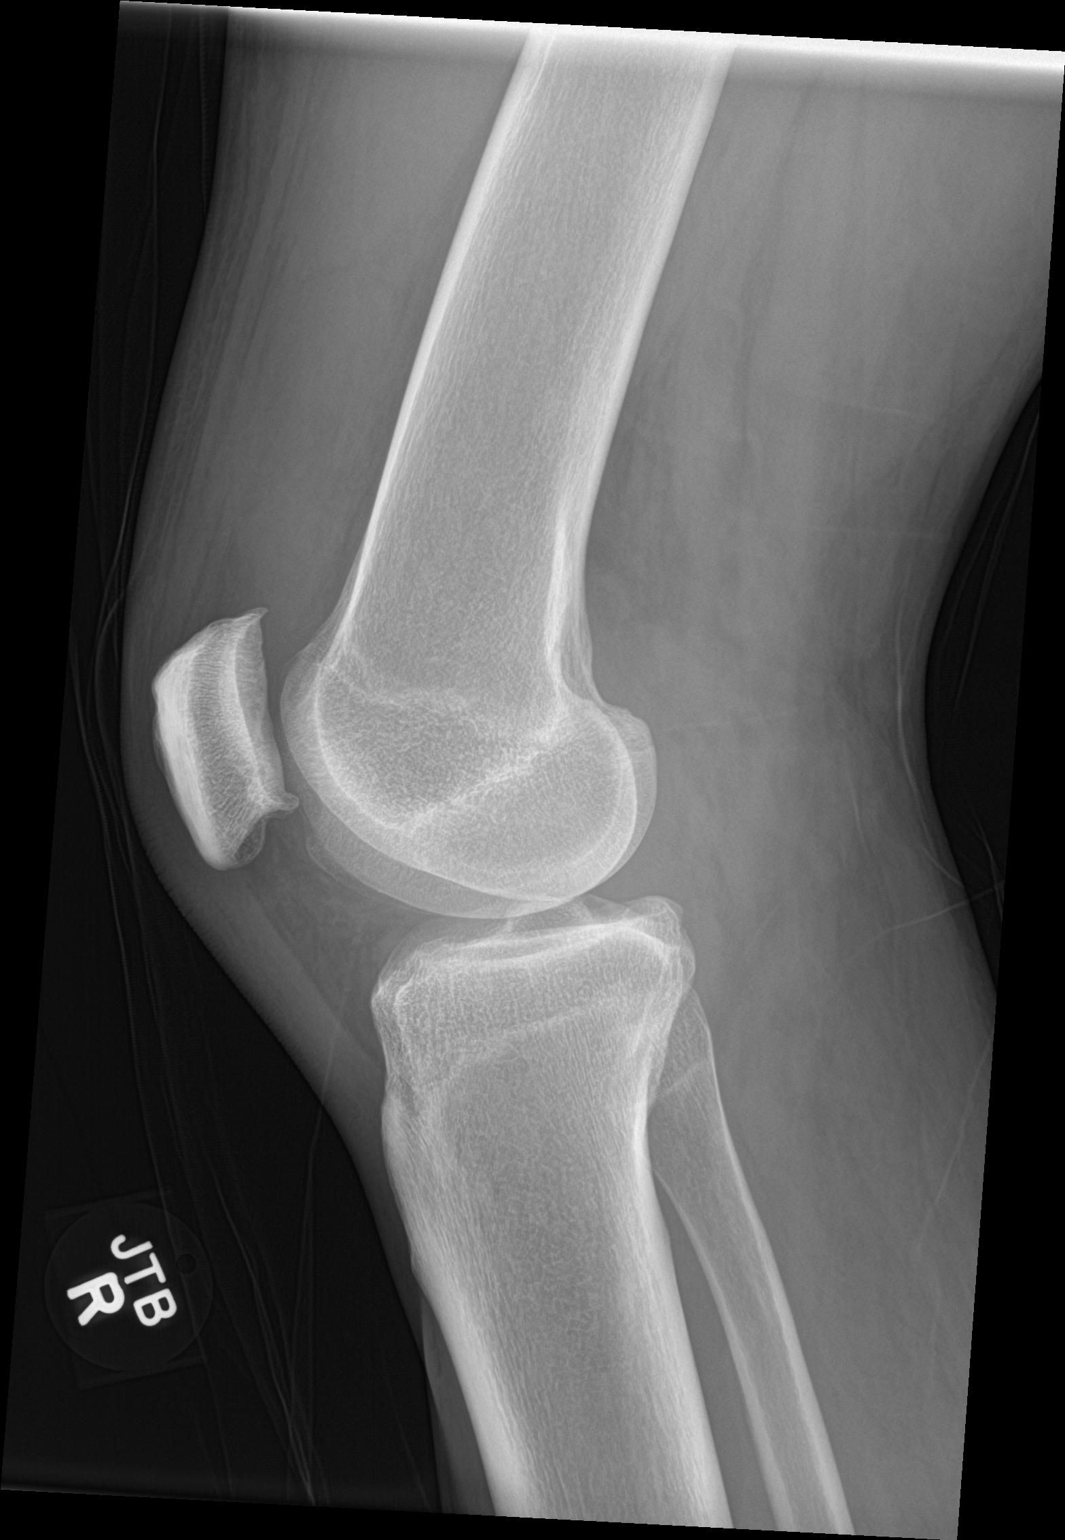

[2 of 2 positions shown; findings below may reference images not displayed]

FINDINGS: Small joint effusion. Mild narrowing of the medial femorotibial
compartment. Small periarticular osteophytes of the patellofemoral
compartment. No acute fracture or dislocation identified.
IMPRESSION: 1. Small knee joint effusion.
2. Mild osteoarthrosis of the knee in medial femorotibial and
patellofemoral compartments.
3. No acute fracture or dislocation identified.

By: Tislit Dupont M.D.

## 2019-03-10 NOTE — Progress Notes (Signed)
Cardiology Office Note:    Date:  03/11/2019   ID:  Brett Salinas, DOB 05-Jul-1970, MRN [id number]  PCP:  Patient, No Pcp Per  Cardiologist:  No primary care provider on file.  Electrophysiologist:  None   Referring MD: No ref. provider found   Reason for visit: 3 years follow up  History of Present Illness:    Brett Salinas is a 49 y.o. male with a hx of schizophrenia, bipolar AA male, previously incarcerated. He was living in a hotel when a porcelain sink fell on his right heel, severing his Rt achilles tendon. He underwent I&D and tendon repair 09/19/15. He was discharged 10/01/15. Post op he did not fill his Xarelto Rx but took ASA. He presented to the ED 10/12/15 with exertional chest pain and SOB that had been going on for two days. His drug screen was positive for cocaine. His Troponin came back positive- pk 0.18. Cath done 10/13/15 revealed a 90% proximal LAD and no other significant CAD. He underwent successful PCI with DES. Echo revealed normal left ventricular EF at 60-65%. Mild MR was also noted. He wasplaced on dual antiplatelet therapy with aspirin and Brilinta as well as carvedilol and atorvastatin. I evaluated him for post hospital f/u on 10/29/15. He was doing well from a cardiac standpoint.  03/11/2019 - he was last see in 2018 and is coming after 3 years. He was admitted to [facility] in August 2020 with agitation and aggression after reportedly consuming alcohol, cocaine, Percocet, heroin and ecstasy, was given Versed and Haldol onsite and admitted for acute toxic metabolic encephalopathy, acute renal failure, rhabdomyolysis, elevated troponin. Some concern regarding suicidal ideations and hence was IVC by EDP.  Psychiatry consulted He was diagnosed with ARF sec to polysubstance abuse and associated rhabdomyolysis and dehydration. Creatinine on admission: 3.4, treated with IV sodium bicarbonate drip. Resolved.  Creatinine 1.03 on date of discharge.  The patient  states that he is not very active but with activities of daily living he has no symptoms of chest pain or shortness of breath.  He has been doing really well, he states he has retired and now lives on [address].  He admits to not taking his medications and when asked about cocaine use he responds: " always".   Past Medical History:  Diagnosis Date  . Achilles tendon rupture-surgery 09/19/15 09/19/2015  . Altered mental status 12/23/2016  . Arthritis    "hips, knees, back" (10/13/2015)  . Bacterial conjunctivitis of right eye 2018  . Bipolar disorder ([place])   . CAD -S/P PCI LAD/DES 10/13/15 10/14/2015  . CHF (congestive heart failure) ([place])   . Cocaine abuse-drug sceen positive, History of 10/14/2015  . Coronary artery disease   . Depression   . DVT (deep venous thrombosis) ([place]) 09/2015   right  . Essential hypertension 11/30/2015  . Heart murmur   . High cholesterol   . Hypertension   . Myocardial infarction ([place]) 10/12/2015 X 2-3  . NSTEMI (non-ST elevated myocardial infarction) ([place])   . Osteoarthritis   . Rectal myiasis   . Schizoaffective disorder, bipolar type ([place]) 10/14/2015  . Schizoaffective disorder, bipolar type ([place])   . Sciatica     Past Surgical History:  Procedure Laterality Date  . CARDIAC CATHETERIZATION N/A 10/13/2015   Procedure: Left Heart Cath and Coronary Angiography;  Surgeon: Jettie Booze, MD;  Location: [place] [facility];  Service: Cardiovascular;  Laterality: N/A;  . CARDIAC CATHETERIZATION N/A 10/13/2015   Procedure: Coronary  Stent Intervention;  Surgeon: Jettie Booze, MD;  Location: [place] [facility];  Service: Cardiovascular;  Laterality: N/A;  . CARDIAC CATHETERIZATION N/A 10/13/2015   Procedure: Intravascular Ultrasound/IVUS;  Surgeon: Jettie Booze, MD;  Location: [place] [facility];  Service: Cardiovascular;  Laterality: N/A;  . CORONARY ANGIOPLASTY WITH STENT PLACEMENT  10/13/2015  . I & D EXTREMITY Right 09/19/2015   Procedure:  IRRIGATION AND DEBRIDEMENT ANKLE LACERATIONS POSSIBLE TENDON REPAIR;  Surgeon: Meredith Pel, MD;  Location: [place];  Service: Orthopedics;  Laterality: Right;  . I & D EXTREMITY Right 10/14/2015   Procedure: IRRIGATION AND DEBRIDEMENT EXTREMITY/Right Ankle;  Surgeon: Meredith Pel, MD;  Location: [place];  Service: Orthopedics;  Laterality: Right;    Current Medications: Current Meds  Medication Sig  . aspirin EC 81 MG tablet Take 1 tablet (81 mg total) by mouth daily.     Allergies:   Catfish [fish allergy], Heparin, and Pork-derived products   Social History   Socioeconomic History  . Marital status: Single    Spouse name: Not on file  . Number of children: Not on file  . Years of education: Not on file  . Highest education level: Not on file  Occupational History  . Not on file  Tobacco Use  . Smoking status: Never Smoker  . Smokeless tobacco: Never Used  Substance and Sexual Activity  . Alcohol use: Yes  . Drug use: Yes    Types: Cocaine, Marijuana    Comment: 10/13/2015 "I've tried alot; I don't have any habits"  . Sexual activity: Not on file  Other Topics Concern  . Not on file  Social History Narrative   ** Merged History Encounter **       Social Determinants of Health   Financial Resource Strain:   . Difficulty of Paying Living Expenses: Not on file  Food Insecurity:   . Worried About Charity fundraiser in the Last Year: Not on file  . Ran Out of Food in the Last Year: Not on file  Transportation Needs:   . Lack of Transportation (Medical): Not on file  . Lack of Transportation (Non-Medical): Not on file  Physical Activity:   . Days of Exercise per Week: Not on file  . Minutes of Exercise per Session: Not on file  Stress:   . Feeling of Stress : Not on file  Social Connections:   . Frequency of Communication with Friends and Family: Not on file  . Frequency of Social Gatherings with Friends and Family: Not on file  . Attends Religious Services:  Not on file  . Active Member of Clubs or Organizations: Not on file  . Attends Archivist Meetings: Not on file  . Marital Status: Not on file     Family History: The patient's family history is not on file.  ROS:   Please see the history of present illness.    All other systems reviewed and are negative.  EKGs/Labs/Other Studies Reviewed:    The following studies were reviewed today:  EKG:  EKG is not ordered today.  The ekg ordered August 2020 demonstrates normal sinus rhythm normal EKG, this was personally reviewed.  Recent Labs: 09/25/2018: Hemoglobin 15.0; Magnesium 2.6; Platelets 230 09/27/2018: ALT 41; BUN 14; Creatinine, Ser 1.03; Potassium 3.9; Sodium 137   Recent Lipid Panel    Component Value Date/Time   CHOL 165 12/28/2016 0517   TRIG 58 12/28/2016 0517   HDL 56 12/28/2016 0517  CHOLHDL 2.9 12/28/2016 0517   VLDL 12 12/28/2016 0517   LDLCALC 97 12/28/2016 0517    Physical Exam:    VS:  BP (!) 148/96   Pulse 70   Ht '6\' 7"'$  (2.007 m)   Wt 204 lb 12.8 oz (92.9 kg)   SpO2 96%   BMI 23.07 kg/m     Wt Readings from Last 3 Encounters:  03/11/19 204 lb 12.8 oz (92.9 kg)  09/25/18 207 lb 7.3 oz (94.1 kg)  01/16/18 220 lb (99.8 kg)     GEN:  Well nourished, well developed in no acute distress HEENT: Normal NECK: No JVD; No carotid bruits LYMPHATICS: No lymphadenopathy CARDIAC: RRR, no murmurs, rubs, gallops RESPIRATORY:  Clear to auscultation without rales, wheezing or rhonchi  ABDOMEN: Soft, non-tender, non-distended MUSCULOSKELETAL:  No edema; No deformity  SKIN: Warm and dry NEUROLOGIC:  Alert and oriented x 3 PSYCHIATRIC:  Normal affect   ASSESSMENT:    1. Coronary artery disease involving native coronary artery of native heart without angina pectoris   2. Essential hypertension   3. Mixed hyperlipidemia   4. Cocaine use    PLAN:    In order of problems listed above:  1. CAD: s/p NSTEMI with 90% LAD stenosis, S/p PCI + DES on  10/2015.  He was previously noncompliant however currently taking metoprolol aspirin and Plavix. He is now asymptomatic. I will restart losartan 25 mg daily, atorvastatin 40 mg daily and aspirin 81 mg daily.  2. HTN: Uncontrolled, I am restarting losartan 25 mg daily.  Given his cocaine use I would stay away from beta-blockers.  3. HLD: He agrees to start taking atorvastatin 40 mg po daily.  4.  Cocaine use -advised about harmful effects to his heart.  Medication Adjustments/Labs and Tests Ordered: Current medicines are reviewed at length with the patient today.  Concerns regarding medicines are outlined above.  Orders Placed This Encounter  Procedures  . Comp Met (CMET)  . CBC  . TSH  . Lipid Profile   Meds ordered this encounter  Medications  . losartan (COZAAR) 25 MG tablet    Sig: Take 1 tablet (25 mg total) by mouth daily.    Dispense:  90 tablet    Refill:  3  . atorvastatin (LIPITOR) 40 MG tablet    Sig: Take 1 tablet (40 mg total) by mouth daily.    Dispense:  90 tablet    Refill:  3    Patient Instructions  Medication Instructions:   START TAKING ASPIRIN 81 MG BY MOUTH DAILY  START TAKING ATORVASTATIN 40 MG BY MOUTH DAILY  START TAKING LOSARTAN 25 MG BY MOUTH DAILY  *If you need a refill on your cardiac medications before your next appointment, please call your pharmacy*   Lab Work:  TODAY-CMET, CBC, TSH, AND LIPIDS  If you have labs (blood work) drawn today and your tests are completely normal, you will receive your results only by: Marland Kitchen MyChart Message (if you have MyChart) OR . A paper copy in the mail If you have any lab test that is abnormal or we need to change your treatment, we will call you to review the results.    Follow-Up: At [facility], you and your health needs are our priority.  As part of our continuing mission to provide you with exceptional heart care, we have created designated Provider Care Teams.  These Care Teams include your  primary Cardiologist (physician) and Advanced Practice Providers (APPs -  Physician Assistants  and Nurse Practitioners) who all work together to provide you with the care you need, when you need it.  Your next appointment:   12 month(s)  The format for your next appointment:   In Person  Provider:   Ena Dawley, MD       Signed, Ena Dawley, MD  03/11/2019 9:59 AM    [place]

## 2019-03-11 ENCOUNTER — Other Ambulatory Visit: Payer: Self-pay

## 2019-03-11 ENCOUNTER — Ambulatory Visit (INDEPENDENT_AMBULATORY_CARE_PROVIDER_SITE_OTHER): Payer: Self-pay | Admitting: Cardiology

## 2019-03-11 ENCOUNTER — Encounter: Payer: Self-pay | Admitting: Cardiology

## 2019-03-11 VITALS — BP 148/96 | HR 70 | Ht 79.0 in | Wt 204.8 lb

## 2019-03-11 DIAGNOSIS — I10 Essential (primary) hypertension: Secondary | ICD-10-CM

## 2019-03-11 DIAGNOSIS — E782 Mixed hyperlipidemia: Secondary | ICD-10-CM

## 2019-03-11 DIAGNOSIS — I2510 Atherosclerotic heart disease of native coronary artery without angina pectoris: Secondary | ICD-10-CM

## 2019-03-11 DIAGNOSIS — F1490 Cocaine use, unspecified, uncomplicated: Secondary | ICD-10-CM

## 2019-03-11 LAB — COMPREHENSIVE METABOLIC PANEL
ALT: 10 IU/L (ref 0–44)
AST: 15 IU/L (ref 0–40)
Albumin/Globulin Ratio: 1.6 (ref 1.2–2.2)
Albumin: 4.1 g/dL (ref 4.0–5.0)
Alkaline Phosphatase: 77 IU/L (ref 39–117)
BUN/Creatinine Ratio: 14 (ref 9–20)
BUN: 14 mg/dL (ref 6–24)
Bilirubin Total: 0.4 mg/dL (ref 0.0–1.2)
CO2: 25 mmol/L (ref 20–29)
Calcium: 9.4 mg/dL (ref 8.7–10.2)
Chloride: 103 mmol/L (ref 96–106)
Creatinine, Ser: 1.01 mg/dL (ref 0.76–1.27)
GFR calc Af Amer: 100 mL/min/{1.73_m2} (ref >59)
GFR calc non Af Amer: 87 mL/min/{1.73_m2} (ref >59)
Globulin, Total: 2.6 g/dL (ref 1.5–4.5)
Glucose: 91 mg/dL (ref 65–99)
Potassium: 4.3 mmol/L (ref 3.5–5.2)
Sodium: 141 mmol/L (ref 134–144)
Total Protein: 6.7 g/dL (ref 6.0–8.5)

## 2019-03-11 LAB — LIPID PANEL
Chol/HDL Ratio: 2.3 ratio (ref 0.0–5.0)
Cholesterol, Total: 195 mg/dL (ref 100–199)
HDL: 84 mg/dL (ref >39)
LDL Chol Calc (NIH): 104 mg/dL — ABNORMAL HIGH (ref 0–99)
Triglycerides: 32 mg/dL (ref 0–149)
VLDL Cholesterol Cal: 7 mg/dL (ref 5–40)

## 2019-03-11 LAB — CBC
Hematocrit: 40.5 % (ref 37.5–51.0)
Hemoglobin: 13.3 g/dL (ref 13.0–17.7)
MCH: 29.2 pg (ref 26.6–33.0)
MCHC: 32.8 g/dL (ref 31.5–35.7)
MCV: 89 fL (ref 79–97)
Platelets: 224 10*3/uL (ref 150–450)
RBC: 4.56 x10E6/uL (ref 4.14–5.80)
RDW: 14 % (ref 11.6–15.4)
WBC: 5.2 10*3/uL (ref 3.4–10.8)

## 2019-03-11 LAB — TSH: TSH: 1.41 u[IU]/mL (ref 0.450–4.500)

## 2019-03-11 MED ORDER — LOSARTAN POTASSIUM 25 MG PO TABS: 25.0000 mg | ORAL_TABLET | Freq: Every day | ORAL | 3 refills | Status: DC

## 2019-03-11 MED ORDER — ATORVASTATIN CALCIUM 40 MG PO TABS: 40.0000 mg | ORAL_TABLET | Freq: Every day | ORAL | 3 refills | Status: AC

## 2019-03-11 MED FILL — LOSARTAN POTASSIUM 25 MG TA: 25 | 30 days supply | Qty: 30 | Fill #0

## 2019-03-11 MED FILL — ATORVASTATIN CALCIUM 40 MG: 40 | 30 days supply | Qty: 30 | Fill #0

## 2019-03-11 NOTE — Patient Instructions (Signed)
Medication Instructions:   START TAKING ASPIRIN 81 MG BY MOUTH DAILY  START TAKING ATORVASTATIN 40 MG BY MOUTH DAILY  START TAKING LOSARTAN 25 MG BY MOUTH DAILY  *If you need a refill on your cardiac medications before your next appointment, please call your pharmacy*   Lab Work:  TODAY-CMET, CBC, TSH, AND LIPIDS  If you have labs (blood work) drawn today and your tests are completely normal, you will receive your results only by: Marland Kitchen MyChart Message (if you have MyChart) OR . A paper copy in the mail If you have any lab test that is abnormal or we need to change your treatment, we will call you to review the results.    Follow-Up: At [facility], you and your health needs are our priority.  As part of our continuing mission to provide you with exceptional heart care, we have created designated Provider Care Teams.  These Care Teams include your primary Cardiologist (physician) and Advanced Practice Providers (APPs -  Physician Assistants and Nurse Practitioners) who all work together to provide you with the care you need, when you need it.  Your next appointment:   12 month(s)  The format for your next appointment:   In Person  Provider:   Tobias Alexander, MD

## 2019-03-17 ENCOUNTER — Encounter (HOSPITAL_COMMUNITY): Payer: Self-pay

## 2019-03-17 ENCOUNTER — Emergency Department (HOSPITAL_COMMUNITY)
Admission: EM | Admit: 2019-03-17 | Discharge: 2019-03-17 | Disposition: A | Discharge status: Home / Self Care | Payer: Self-pay | Attending: Emergency Medicine | Admitting: Emergency Medicine

## 2019-03-17 ENCOUNTER — Emergency Department (HOSPITAL_COMMUNITY): Payer: Self-pay

## 2019-03-17 ENCOUNTER — Other Ambulatory Visit: Payer: Self-pay

## 2019-03-17 DIAGNOSIS — J449 Chronic obstructive pulmonary disease, unspecified: Secondary | ICD-10-CM | POA: Insufficient documentation

## 2019-03-17 DIAGNOSIS — Z79899 Other long term (current) drug therapy: Secondary | ICD-10-CM | POA: Insufficient documentation

## 2019-03-17 DIAGNOSIS — I2510 Atherosclerotic heart disease of native coronary artery without angina pectoris: Secondary | ICD-10-CM | POA: Insufficient documentation

## 2019-03-17 DIAGNOSIS — I509 Heart failure, unspecified: Secondary | ICD-10-CM | POA: Insufficient documentation

## 2019-03-17 DIAGNOSIS — S60511A Abrasion of right hand, initial encounter: Secondary | ICD-10-CM | POA: Insufficient documentation

## 2019-03-17 DIAGNOSIS — Y929 Unspecified place or not applicable: Secondary | ICD-10-CM | POA: Insufficient documentation

## 2019-03-17 DIAGNOSIS — R52 Pain, unspecified: Secondary | ICD-10-CM

## 2019-03-17 DIAGNOSIS — Z23 Encounter for immunization: Secondary | ICD-10-CM | POA: Insufficient documentation

## 2019-03-17 DIAGNOSIS — M542 Cervicalgia: Secondary | ICD-10-CM | POA: Insufficient documentation

## 2019-03-17 DIAGNOSIS — S60512A Abrasion of left hand, initial encounter: Secondary | ICD-10-CM | POA: Insufficient documentation

## 2019-03-17 DIAGNOSIS — R1084 Generalized abdominal pain: Secondary | ICD-10-CM | POA: Insufficient documentation

## 2019-03-17 DIAGNOSIS — S50811A Abrasion of right forearm, initial encounter: Secondary | ICD-10-CM | POA: Insufficient documentation

## 2019-03-17 DIAGNOSIS — I252 Old myocardial infarction: Secondary | ICD-10-CM | POA: Insufficient documentation

## 2019-03-17 DIAGNOSIS — S50812A Abrasion of left forearm, initial encounter: Secondary | ICD-10-CM | POA: Insufficient documentation

## 2019-03-17 DIAGNOSIS — Z7982 Long term (current) use of aspirin: Secondary | ICD-10-CM | POA: Insufficient documentation

## 2019-03-17 DIAGNOSIS — R519 Headache, unspecified: Secondary | ICD-10-CM | POA: Insufficient documentation

## 2019-03-17 DIAGNOSIS — S0083XA Contusion of other part of head, initial encounter: Secondary | ICD-10-CM | POA: Insufficient documentation

## 2019-03-17 DIAGNOSIS — Z955 Presence of coronary angioplasty implant and graft: Secondary | ICD-10-CM | POA: Insufficient documentation

## 2019-03-17 DIAGNOSIS — Y999 Unspecified external cause status: Secondary | ICD-10-CM | POA: Insufficient documentation

## 2019-03-17 DIAGNOSIS — I110 Hypertensive heart disease with heart failure: Secondary | ICD-10-CM | POA: Insufficient documentation

## 2019-03-17 DIAGNOSIS — Y939 Activity, unspecified: Secondary | ICD-10-CM | POA: Insufficient documentation

## 2019-03-17 LAB — CBC WITH DIFFERENTIAL/PLATELET
Abs Immature Granulocytes: 0.02 10*3/uL (ref 0.00–0.07)
Basophils Absolute: 0.1 10*3/uL (ref 0.0–0.1)
Basophils Relative: 1 %
Eosinophils Absolute: 0.1 10*3/uL (ref 0.0–0.5)
Eosinophils Relative: 2 %
HCT: 42.8 % (ref 39.0–52.0)
Hemoglobin: 13.4 g/dL (ref 13.0–17.0)
Immature Granulocytes: 0 %
Lymphocytes Relative: 24 %
Lymphs Abs: 1.7 10*3/uL (ref 0.7–4.0)
MCH: 29.7 pg (ref 26.0–34.0)
MCHC: 31.3 g/dL (ref 30.0–36.0)
MCV: 94.9 fL (ref 80.0–100.0)
Monocytes Absolute: 0.5 10*3/uL (ref 0.1–1.0)
Monocytes Relative: 6 %
Neutro Abs: 4.7 10*3/uL (ref 1.7–7.7)
Neutrophils Relative %: 67 %
Platelets: 194 10*3/uL (ref 150–400)
RBC: 4.51 MIL/uL (ref 4.22–5.81)
RDW: 14.1 % (ref 11.5–15.5)
WBC: 7.1 10*3/uL (ref 4.0–10.5)
nRBC: 0.7 % — ABNORMAL HIGH (ref 0.0–0.2)

## 2019-03-17 LAB — COMPREHENSIVE METABOLIC PANEL
ALT: 17 U/L (ref 0–44)
AST: 36 U/L (ref 15–41)
Albumin: 3.7 g/dL (ref 3.5–5.0)
Alkaline Phosphatase: 65 U/L (ref 38–126)
Anion gap: 11 (ref 5–15)
BUN: 14 mg/dL (ref 6–20)
CO2: 23 mmol/L (ref 22–32)
Calcium: 9.3 mg/dL (ref 8.9–10.3)
Chloride: 107 mmol/L (ref 98–111)
Creatinine, Ser: 1.31 mg/dL — ABNORMAL HIGH (ref 0.61–1.24)
GFR calc Af Amer: >60 mL/min (ref >60)
GFR calc non Af Amer: >60 mL/min (ref >60)
Glucose, Bld: 90 mg/dL (ref 70–99)
Potassium: 4 mmol/L (ref 3.5–5.1)
Sodium: 141 mmol/L (ref 135–145)
Total Bilirubin: 2.4 mg/dL — ABNORMAL HIGH (ref 0.3–1.2)
Total Protein: 6.9 g/dL (ref 6.5–8.1)

## 2019-03-17 MED ORDER — CYCLOBENZAPRINE HCL 10 MG PO TABS
10.0000 mg | ORAL_TABLET | Freq: Once | ORAL | Status: AC
  Administered 2019-03-17: 10 mg via ORAL
  Filled 2019-03-17: qty 1

## 2019-03-17 MED ORDER — DICLOFENAC SODIUM 1 % EX GEL: 4.0000 g | Freq: Four times a day (QID) | CUTANEOUS | 0 refills | Status: DC

## 2019-03-17 MED ORDER — HYDROCODONE-ACETAMINOPHEN 5-325 MG PO TABS
1.0000 | ORAL_TABLET | Freq: Once | ORAL | Status: AC
  Administered 2019-03-17: 1 via ORAL
  Filled 2019-03-17: qty 1

## 2019-03-17 MED ORDER — CYCLOBENZAPRINE HCL 10 MG PO TABS: 10.0000 mg | ORAL_TABLET | Freq: Two times a day (BID) | ORAL | 0 refills | Status: DC | PRN

## 2019-03-17 MED ORDER — TETANUS-DIPHTH-ACELL PERTUSSIS 5-2.5-18.5 LF-MCG/0.5 IM SUSP
0.5000 mL | Freq: Once | INTRAMUSCULAR | Status: AC
  Administered 2019-03-17: 0.5 mL via INTRAMUSCULAR
  Filled 2019-03-17: qty 0.5

## 2019-03-17 MED ORDER — IOHEXOL 300 MG/ML  SOLN
100.0000 mL | Freq: Once | INTRAMUSCULAR | Status: AC | PRN
  Administered 2019-03-17: 100 mL via INTRAVENOUS

## 2019-03-17 MED ORDER — DICLOFENAC SODIUM 1 % EX GEL
4.0000 g | Freq: Four times a day (QID) | CUTANEOUS | Status: DC
  Administered 2019-03-17: 4 g via TOPICAL
  Filled 2019-03-17: qty 100

## 2019-03-17 NOTE — ED Notes (Signed)
This RN offered patient [place] sandwich and pt became angry stating that his lips are swollen and how is he supposed to eat that? RN offered pt something to drink and patient repeats that he wants something to eat and that we should have had something ordered for him to eat since he has been here since 3. Pt informed this RN unable to order him tray from cafeteria at 3 since he had labs and imaging in process at that time. Pt continues to ask this RN for something to eat and patient again informed that we only have [place] sandwich bags, cheese and crackers in the ED. Pt provided with sprite, water and sandwich bag at this time.

## 2019-03-17 NOTE — ED Notes (Signed)
Patient Alert and oriented to baseline. Stable and ambulatory to baseline. Patient verbalized understanding of the discharge instructions.  Patient belongings were taken by the patient.   

## 2019-03-17 NOTE — ED Notes (Signed)
EDP at the bedside updating patient.  
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
## 2019-03-17 NOTE — ED Notes (Signed)
Pt transported to CT at this time.
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
## 2019-03-17 NOTE — Discharge Instructions (Signed)
All your x-rays today are normal.  There is no sign of broken bones or internal bleeding.  You do have a lot of bruising which is causing your muscle and skin pain.  You can use the diclofenac gel for pain in your feet arms or legs.  You can also take extra strength Tylenol every 6 hours and the muscle relaxer.  If things are not improved within the next week you should follow-up with your doctor.

## 2019-03-18 MED FILL — CYCLOBENZAPRINE 10 MG TAB: 10 | 10 days supply | Qty: 20 | Fill #0

## 2019-03-18 MED FILL — DICLOFENAC SODIUM 1 % GEL: 1 | 6 days supply | Qty: 100 | Fill #0

## 2019-05-01 ENCOUNTER — Emergency Department (HOSPITAL_COMMUNITY)
Admission: EM | Admit: 2019-05-01 | Discharge: 2019-05-03 | Disposition: A | Discharge status: Other Acute Inpatient Hospital | Payer: Self-pay | Attending: Psychiatry | Admitting: Psychiatry

## 2019-05-01 DIAGNOSIS — F250 Schizoaffective disorder, bipolar type: Secondary | ICD-10-CM | POA: Diagnosis present

## 2019-05-01 DIAGNOSIS — Z79899 Other long term (current) drug therapy: Secondary | ICD-10-CM | POA: Insufficient documentation

## 2019-05-01 DIAGNOSIS — R4689 Other symptoms and signs involving appearance and behavior: Secondary | ICD-10-CM

## 2019-05-01 DIAGNOSIS — I509 Heart failure, unspecified: Secondary | ICD-10-CM | POA: Insufficient documentation

## 2019-05-01 DIAGNOSIS — J449 Chronic obstructive pulmonary disease, unspecified: Secondary | ICD-10-CM | POA: Insufficient documentation

## 2019-05-01 DIAGNOSIS — I110 Hypertensive heart disease with heart failure: Secondary | ICD-10-CM | POA: Insufficient documentation

## 2019-05-01 DIAGNOSIS — Z20822 Contact with and (suspected) exposure to covid-19: Secondary | ICD-10-CM | POA: Insufficient documentation

## 2019-05-01 DIAGNOSIS — Z7982 Long term (current) use of aspirin: Secondary | ICD-10-CM | POA: Insufficient documentation

## 2019-05-01 LAB — CBC WITH DIFFERENTIAL/PLATELET
Abs Immature Granulocytes: 0.02 10*3/uL (ref 0.00–0.07)
Basophils Absolute: 0 10*3/uL (ref 0.0–0.1)
Basophils Relative: 1 %
Eosinophils Absolute: 0.2 10*3/uL (ref 0.0–0.5)
Eosinophils Relative: 3 %
HCT: 41.6 % (ref 39.0–52.0)
Hemoglobin: 13.4 g/dL (ref 13.0–17.0)
Immature Granulocytes: 0 %
Lymphocytes Relative: 36 %
Lymphs Abs: 2.5 10*3/uL (ref 0.7–4.0)
MCH: 29.6 pg (ref 26.0–34.0)
MCHC: 32.2 g/dL (ref 30.0–36.0)
MCV: 91.8 fL (ref 80.0–100.0)
Monocytes Absolute: 0.3 10*3/uL (ref 0.1–1.0)
Monocytes Relative: 5 %
Neutro Abs: 3.8 10*3/uL (ref 1.7–7.7)
Neutrophils Relative %: 55 %
Platelets: 240 10*3/uL (ref 150–400)
RBC: 4.53 MIL/uL (ref 4.22–5.81)
RDW: 13.2 % (ref 11.5–15.5)
WBC: 6.9 10*3/uL (ref 4.0–10.5)
nRBC: 0 % (ref 0.0–0.2)

## 2019-05-01 NOTE — ED Provider Notes (Signed)
[facility] EMERGENCY DEPARTMENT Provider Note   CSN: [id number] Arrival date & time: 05/01/19  2140     History Chief Complaint  Patient presents with  . Fatigue    Brett Salinas is a 49 y.o. male presenting for foot pain.  Level 5 caveat due to psychiatric disorder.  Patient states he was released from jail today.  He is here because his feet hurt, and he may need to be decontaminated.  He is also concerned about internal bleeding.  No trauma.  He is not taking any medications.  He denies alcohol or drug use today.   Additional history obtained from chart review.  Patient with a history of bipolar, CAD, CHF, cocaine use, depression, previous DVT, hypertension, hyperlipidemia, NSTEMI, schizoaffective disorder, polysubstance abuse, rhabdo.  HPI     Past Medical History:  Diagnosis Date  . Achilles tendon rupture-surgery 09/19/15 09/19/2015  . Altered mental status 12/23/2016  . Arthritis    "hips, knees, back" (10/13/2015)  . Bacterial conjunctivitis of right eye 2018  . Bipolar disorder (HCC)   . CAD -S/P PCI LAD/DES 10/13/15 10/14/2015  . CHF (congestive heart failure) (HCC)   . Cocaine abuse-drug sceen positive, History of 10/14/2015  . Coronary artery disease   . Depression   . DVT (deep venous thrombosis) (HCC) 09/2015   right  . Essential hypertension 11/30/2015  . Heart murmur   . High cholesterol   . Hypertension   . Myocardial infarction (HCC) 10/12/2015 X 2-3  . NSTEMI (non-ST elevated myocardial infarction) (HCC)   . Osteoarthritis   . Rectal myiasis   . Schizoaffective disorder, bipolar type (HCC) 10/14/2015  . Schizoaffective disorder, bipolar type (HCC)   . Sciatica     Patient Active Problem List   Diagnosis Date Noted  . Polysubstance abuse (HCC)   . AKI (acute kidney injury) (HCC) 09/25/2018  . Tobacco use disorder 12/29/2016  . COPD (chronic obstructive pulmonary disease) (HCC) 12/28/2016  . Dyslipidemia 12/28/2016  .  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 Drug abuse (HCC)   . Agitation 12/24/2016  . Aggression 12/24/2016  . Altered mental status 12/23/2016  . History of ED visit for Medical clearance for incarceration for alleged aggressive behavior and crack cocaine use 09/05/2016  . Osteoarthritis 07/20/2016  . Essential hypertension 11/30/2015  . CAD -S/P PCI LAD/DES 10/13/15 10/14/2015  . Wound infection- for I&D achilles wound 10/14/15 10/14/2015  . Cocaine abuse-drug sceen positive, History of 10/14/2015  . Schizoaffective disorder, bipolar type (HCC) 10/14/2015  . Infection 10/14/2015  . NSTEMI (non-ST elevated myocardial infarction) (HCC)   . Chest pain 10/12/2015  . Achilles tendon rupture-surgery 09/19/15 09/19/2015    Past Surgical History:  Procedure Laterality Date  . CARDIAC CATHETERIZATION N/A 10/13/2015   Procedure: Left Heart Cath and Coronary Angiography;  Surgeon: Corky Crafts, MD;  Location: [facility] INVASIVE CV LAB;  Service: Cardiovascular;  Laterality: N/A;  . CARDIAC CATHETERIZATION N/A 10/13/2015   Procedure: Coronary Stent Intervention;  Surgeon: Corky Crafts, MD;  Location: [facility] INVASIVE CV LAB;  Service: Cardiovascular;  Laterality: N/A;  . CARDIAC CATHETERIZATION N/A 10/13/2015   Procedure: Intravascular Ultrasound/IVUS;  Surgeon: Corky Crafts, MD;  Location: [facility] INVASIVE CV LAB;  Service: Cardiovascular;  Laterality: N/A;  . CORONARY ANGIOPLASTY WITH STENT PLACEMENT  10/13/2015  . I & D EXTREMITY Right 09/19/2015   Procedure: IRRIGATION AND DEBRIDEMENT ANKLE LACERATIONS POSSIBLE TENDON REPAIR;  Surgeon: Cammy Copa, MD;  Location: [facility] OR;  Service: Orthopedics;  Laterality: Right;  . I & D EXTREMITY Right 10/14/2015   Procedure: IRRIGATION AND DEBRIDEMENT EXTREMITY/Right Ankle;  Surgeon: Cammy Copa, MD;  Location: [facility] OR;  Service: Orthopedics;  Laterality: Right;       No family history on file.  Social History   Tobacco Use  .  Smoking status: Never Smoker  . Smokeless tobacco: Never Used  Substance Use Topics  . Alcohol use: Yes  . Drug use: Yes    Types: Cocaine, Marijuana    Comment: 10/13/2015 "I've tried alot; I don't have any habits"    Home Medications Prior to Admission medications   Medication Sig Start Date End Date Taking? Authorizing Provider  aspirin EC 81 MG tablet Take 1 tablet (81 mg total) by mouth daily. 09/27/18   Hongalgi, Maximino Greenland, MD  atorvastatin (LIPITOR) 40 MG tablet Take 1 tablet (40 mg total) by mouth daily. 03/11/19   Lars Masson, MD  cyclobenzaprine (FLEXERIL) 10 MG tablet Take 1 tablet (10 mg total) by mouth 2 (two) times daily as needed for muscle spasms. 03/17/19   Gwyneth Sprout, MD  diclofenac Sodium (VOLTAREN) 1 % GEL Apply 4 g topically 4 (four) times daily. 03/17/19   Gwyneth Sprout, MD  losartan (COZAAR) 25 MG tablet Take 1 tablet (25 mg total) by mouth daily. 03/11/19   Lars Masson, MD    Allergies    Catfish [fish allergy], Heparin, and Pork-derived products  Review of Systems   Review of Systems  Unable to perform ROS: Psychiatric disorder    Physical Exam Updated Vital Signs Ht 6' (1.829 m)   Wt 104.3 kg   BMI 31.19 kg/m   Physical Exam Constitutional:      Comments: Nontoxic  HENT:     Head: Normocephalic and atraumatic.  Eyes:     Extraocular Movements: Extraocular movements intact.     Conjunctiva/sclera: Conjunctivae normal.     Pupils: Pupils are equal, round, and reactive to light.  Cardiovascular:     Rate and Rhythm: Normal rate and regular rhythm.     Pulses: Normal pulses.  Pulmonary:     Effort: Pulmonary effort is normal.     Breath sounds: Normal breath sounds.  Abdominal:     General: There is no distension.     Tenderness: There is no abdominal tenderness.  Musculoskeletal:        General: Normal range of motion.     Cervical back: Normal range of motion.     Right lower leg: No edema.     Left lower leg: No edema.    Skin:    General: Skin is warm.     Capillary Refill: Capillary refill takes less than 2 seconds.     Findings: Erythema present.     Comments: Skin of dorsal toes of bilateral feet moist, warm, and tender.  Consistent with athlete's foot.  Psychiatric:        Speech: Speech is rapid and pressured and tangential.        Behavior: Behavior is hyperactive.        Thought Content: Thought content is delusional.     Comments: Extremely abnormal affect.  Rapid and pressured speech with tangential thoughts.  Hyperactive movements.  He is able to be redirected for a short period of time.  He is convinced his feet are contaminated, initially rubbing bleach wipes all over his feet.     ED Results / Procedures / Treatments   Labs (  all labs ordered are listed, but only abnormal results are displayed) Labs Reviewed  CBC WITH DIFFERENTIAL/PLATELET  COMPREHENSIVE METABOLIC PANEL  CK  RAPID URINE DRUG SCREEN, HOSP PERFORMED  URINALYSIS, ROUTINE W REFLEX MICROSCOPIC  ETHANOL  SALICYLATE LEVEL  ACETAMINOPHEN LEVEL    EKG None  Radiology No results found.  Procedures Procedures (including critical care time)  Medications Ordered in ED Medications - No data to display  ED Course  I have reviewed the triage vital signs and the nursing notes.  Pertinent labs & imaging results that were available during my care of the patient were reviewed by me and considered in my medical decision making (see chart for details).    MDM Rules/Calculators/A&P                      Patient presenting with concerns for contamination of his feet.  However, on evaluation, patient appears acutely psychotic.  He is unable to provide a reliable history, he has rapid and pressured speech.  Tangential thoughts.  Hyperactive movement.  He is not on any psychiatric medication.  Per chart review, he has a history of schizoaffective disorder, bipolar, and polysubstance abuse.  Will obtain medical clearance screening  labs.  If reassuring, TTS will need to be consulted, as I believe patient would benefit from psychiatric stabilization.  Pt signed out to Andris Baumann, PA-C for f/u on labs. If normal, pt would likely benefit from TTS consult.   Final Clinical Impression(s) / ED Diagnoses Final diagnoses:  None    Rx / DC Orders ED Discharge Orders    None       Franchot Heidelberg, PA-C 05/01/19 2357    Lucrezia Starch, MD 05/06/19 [phone]

## 2019-05-01 NOTE — ED Triage Notes (Signed)
Pt arrived [facility]. Pt recently released from [facility] this morning. Pt feels weak and said that germs make him bleed. Pt staes that  He si weak because he hasn't eating anyth8ng in a while. Pt ate pacK of crackers and chips and refused to stop eating for EMS. Pt. Is swatting face, picking skin and  rubbing motion over arms as if wiping something off of him.  Pt alert and oriented x 3.

## 2019-05-01 NOTE — ED Triage Notes (Signed)
Pt continuyesx to say he needs to be decontaminated and his feet cleaned with bleach.

## 2019-05-01 NOTE — ED Notes (Addendum)
Pt refusing to hav e blood drawn. Pt stated that he didn't want any contaminates in his body.

## 2019-05-02 ENCOUNTER — Other Ambulatory Visit: Payer: Self-pay

## 2019-05-02 ENCOUNTER — Encounter (HOSPITAL_COMMUNITY): Payer: Self-pay | Admitting: Clinical

## 2019-05-02 LAB — RAPID URINE DRUG SCREEN, HOSP PERFORMED
Amphetamines: POSITIVE — AB
Barbiturates: NOT DETECTED
Benzodiazepines: NOT DETECTED
Cocaine: POSITIVE — AB
Opiates: NOT DETECTED
Tetrahydrocannabinol: NOT DETECTED

## 2019-05-02 LAB — COMPREHENSIVE METABOLIC PANEL
ALT: 20 U/L (ref 0–44)
AST: 45 U/L — ABNORMAL HIGH (ref 15–41)
Albumin: 3.7 g/dL (ref 3.5–5.0)
Alkaline Phosphatase: 58 U/L (ref 38–126)
Anion gap: 8 (ref 5–15)
BUN: 18 mg/dL (ref 6–20)
CO2: 29 mmol/L (ref 22–32)
Calcium: 9.1 mg/dL (ref 8.9–10.3)
Chloride: 105 mmol/L (ref 98–111)
Creatinine, Ser: 1.54 mg/dL — ABNORMAL HIGH (ref 0.61–1.24)
GFR calc Af Amer: >60 mL/min (ref >60)
GFR calc non Af Amer: 52 mL/min — ABNORMAL LOW (ref >60)
Glucose, Bld: 109 mg/dL — ABNORMAL HIGH (ref 70–99)
Potassium: 4.2 mmol/L (ref 3.5–5.1)
Sodium: 142 mmol/L (ref 135–145)
Total Bilirubin: 1.1 mg/dL (ref 0.3–1.2)
Total Protein: 6.3 g/dL — ABNORMAL LOW (ref 6.5–8.1)

## 2019-05-02 LAB — CK
Total CK: 1252 U/L — ABNORMAL HIGH (ref 49–397)
Total CK: 1015 U/L — ABNORMAL HIGH (ref 49–397)

## 2019-05-02 LAB — URINALYSIS, ROUTINE W REFLEX MICROSCOPIC
Bilirubin Urine: NEGATIVE
Glucose, UA: NEGATIVE mg/dL
Hgb urine dipstick: NEGATIVE
Ketones, ur: NEGATIVE mg/dL
Leukocytes,Ua: NEGATIVE
Nitrite: NEGATIVE
Protein, ur: NEGATIVE mg/dL
Specific Gravity, Urine: 1.029 (ref 1.005–1.030)
pH: 5 (ref 5.0–8.0)

## 2019-05-02 LAB — SALICYLATE LEVEL: Salicylate Lvl: <7 mg/dL — ABNORMAL LOW (ref 7.0–30.0)

## 2019-05-02 LAB — RESPIRATORY PANEL BY RT PCR (FLU A&B, COVID)
Influenza A by PCR: NEGATIVE
Influenza B by PCR: NEGATIVE
SARS Coronavirus 2 by RT PCR: NEGATIVE

## 2019-05-02 LAB — ACETAMINOPHEN LEVEL: Acetaminophen (Tylenol), Serum: <10 ug/mL — ABNORMAL LOW (ref 10–30)

## 2019-05-02 LAB — ETHANOL: Alcohol, Ethyl (B): <10 mg/dL (ref <10)

## 2019-05-02 MED ORDER — ZIPRASIDONE MESYLATE 20 MG IM SOLR
20.0000 mg | Freq: Once | INTRAMUSCULAR | Status: AC
  Administered 2019-05-02: 20 mg via INTRAMUSCULAR
  Filled 2019-05-02: qty 20

## 2019-05-02 MED ORDER — LORAZEPAM 1 MG PO TABS
2.0000 mg | ORAL_TABLET | Freq: Four times a day (QID) | ORAL | Status: DC | PRN
  Administered 2019-05-02: 2 mg via ORAL
  Filled 2019-05-02: qty 2

## 2019-05-02 MED ORDER — LORAZEPAM 2 MG/ML IJ SOLN
1.0000 mg | Freq: Once | INTRAMUSCULAR | Status: AC
  Administered 2019-05-02: 1 mg via INTRAMUSCULAR
  Filled 2019-05-02: qty 1

## 2019-05-02 MED ORDER — SODIUM CHLORIDE 0.9 % IV BOLUS
1000.0000 mL | Freq: Once | INTRAVENOUS | Status: AC
  Administered 2019-05-02: 1000 mL via INTRAVENOUS

## 2019-05-02 MED ORDER — LORAZEPAM 2 MG/ML IJ SOLN: 2.0000 mg | INTRAMUSCULAR | Status: DC | PRN

## 2019-05-02 MED ORDER — OLANZAPINE 5 MG PO TBDP
15.0000 mg | ORAL_TABLET | Freq: Two times a day (BID) | ORAL | Status: DC
  Administered 2019-05-02 – 2019-05-03 (×2): 15 mg via ORAL
  Filled 2019-05-02 (×3): qty 3

## 2019-05-02 MED ORDER — SODIUM CHLORIDE 0.9 % IV BOLUS: 1000.0000 mL | Freq: Once | INTRAVENOUS | Status: DC

## 2019-05-02 NOTE — ED Notes (Addendum)
Sitter arrived to bedside - urine specimen cup given for pt. Pt noted to be sleeping. Respirations even, unlabored. Will notify TTS when pt awakes.

## 2019-05-02 NOTE — ED Notes (Addendum)
Pt asking if he has to stay - "Am I on a psychiatric hold?" Advised pt tx plan - Inpt. Voiced understanding then is asking for more food - [place] sandwich given by [employer]. Pt states he has not eaten since Sunday.

## 2019-05-02 NOTE — BH Assessment (Signed)
Checked in at 1059 to see if patient is awake and available to completed his TTS consult. Patient is still sleep, per his nurse. Will follow up at a later time.

## 2019-05-02 NOTE — ED Notes (Signed)
Pt's spouse, Jimmie Molly - [phone] - called and advised she and pt live at [facility] and that pt came here to [facility] and has "lost the truck". States pt called her last pm and told her he was in Rm 25 and that it was "contaminated room". States she has no way of coming to get pt d/t pt drove their only vehicle. States they do have a friend, though, who can come get him later. States pt has been off his meds as he refuses to take them. States she does not know what they are d/t pt throws rx's away. Spouse noted to be supportive of pt and is willing for him to return home but would like for him to locate their truck first.

## 2019-05-02 NOTE — ED Notes (Signed)
Pt is resting quietly at this time.

## 2019-05-02 NOTE — ED Notes (Signed)
Pt awake & eating lunch

## 2019-05-02 NOTE — Plan of Care (Signed)
Case reviewed with assessment team member:  Patient presented with delusional believes, drug screen pending, is known to have a psychotic disorder, and elevated CK, at 1015 today-  Due to extreme agitation we will order sublingual olanzapine mindful that his CK is already elevated so we would have to look for other signs of an MS if it occurs We will order as needed Ativan too/ follow with you

## 2019-05-02 NOTE — BH Assessment (Signed)
Patient unable to be assessed as of 0719. Patient given Geodon. Patient's nurse will call TTS when patient is able to be assessed.

## 2019-05-02 NOTE — ED Notes (Signed)
Pt arrived to Rm 51 via w/c - pt ambulated to bed - sleeping at this time. Breakfast on bedside table. No Sitter available per Staffing Office - Jamie, Consulting civil engineer, aware.

## 2019-05-02 NOTE — ED Notes (Signed)
TTS being performed.  

## 2019-05-02 NOTE — ED Notes (Signed)
Security at bedside with this RN to COVID test the pt.

## 2019-05-02 NOTE — ED Notes (Signed)
Pt pulled out IV and stated that his blood was all over the room. Pt stated over and over we were trying to poison him and get him. Security called and emergency oderrs began.

## 2019-05-02 NOTE — ED Notes (Signed)
Copy of IVC papers faxed to [facility] [phone] as requested and re-faxed a copy to [facility].

## 2019-05-02 NOTE — BH Assessment (Signed)
Patient unable to be assessed as of 0719. Patient given Geodon. Patient's nurse will call TTS when patient is able to be assessed.  

## 2019-05-02 NOTE — ED Notes (Signed)
Per Sharyl Nimrod, SW, pt meets Inpt Criteria - Dr [name] to make med recommendations.

## 2019-05-02 NOTE — Progress Notes (Signed)
CSW received a phone call from [facility]. They are reviewing the patient for possible placement. They requested MD progress notes, RN notes, and urine drug screen.   CSW called and asked RN to fax over IVC when she had a moment.   CSW will continue to follow and assist with disposition planning.   Drucilla Schmidt, MSW, LCSW-A Clinical Disposition Social Worker [employer] [facility]/TTS [phone]

## 2019-05-02 NOTE — ED Notes (Signed)
IVC papers - Copy faxed to [facility] - Copy sent to Medical Records - Original placed in folder for Magistrate - ALL 3 sets on clipboard.  

## 2019-05-02 NOTE — ED Notes (Signed)
Pt eating dinner - also given sandwich and Sprite. Advised pt he may another snack at 2100. Pt states "If somebody does not give me candy, I am going to check myself out!"

## 2019-05-02 NOTE — BH Assessment (Addendum)
Tele Assessment Note   Patient Name: Brett Salinas MRN: [id number] Referring Physician: Junious Silk Location of Patient: [facility] ED Location of Provider: [place] is an 49 y.o. male presenting voluntarily to [facility] ED after being released from jail requesting he be "decontaminated." Patient has since been placed under IVC as he required chemical restraints due to agitation.  Patient is calm and cooperative during assessment process, however is a poor historian due to [place]. His speech is pressured and he rambles incoherently at times in assessment. Patient appears to be delusional. He states last night 2 people pretending to be cops arrested him on "trumped up charges." He states that they were following him in a car. He states the officers were white but wore black masks with dreadlocks. Patient concerned that the handcuffs they used had chemicals in them and they were trying to kill him. Patient also paranoid that hospital staff are trying to contaminate his blood. He states "I have royal blue blood. They can see the aura coming off my body." Patient states he was in prison from 2002-2017 for crimes he did not commit. He denies SI/HI. Patient endorses AVH but cannot provide specifics, other than he "take pictures of these things." Patient endorses cocaine use but is unable to provide any reliable information about his use. UDS pending at time of assessment.   Patient is alert and oriented x 2. His speech is pressured, eye contact is fair, and thoughts are disorganized. His mood is anxious and his affect is congruent. He has poor insight, judgement, and impulse control. Patient appears to be delusional.   Diagnosis: Schizoaffective disorder, bipolar type  Past Medical History:  Past Medical History:  Diagnosis Date  . Achilles tendon rupture-surgery 09/19/15 09/19/2015  . Altered mental status 12/23/2016  . Arthritis    "hips, knees, back" (10/13/2015)   . Bacterial conjunctivitis of right eye 2018  . Bipolar disorder ([place])   . CAD -S/P PCI LAD/DES 10/13/15 10/14/2015  . CHF (congestive heart failure) ([place])   . Cocaine abuse-drug sceen positive, History of 10/14/2015  . Coronary artery disease   . Depression   . DVT (deep venous thrombosis) ([place]) 09/2015   right  . Essential hypertension 11/30/2015  . Heart murmur   . High cholesterol   . Hypertension   . Myocardial infarction ([place]) 10/12/2015 X 2-3  . NSTEMI (non-ST elevated myocardial infarction) ([place])   . Osteoarthritis   . Rectal myiasis   . Schizoaffective disorder, bipolar type ([place]) 10/14/2015  . Schizoaffective disorder, bipolar type ([place])   . Sciatica     Past Surgical History:  Procedure Laterality Date  . CARDIAC CATHETERIZATION N/A 10/13/2015   Procedure: Left Heart Cath and Coronary Angiography;  Surgeon: Jettie Booze, MD;  Location: [place] CV LAB;  Service: Cardiovascular;  Laterality: N/A;  . CARDIAC CATHETERIZATION N/A 10/13/2015   Procedure: Coronary Stent Intervention;  Surgeon: Jettie Booze, MD;  Location: [place] CV LAB;  Service: Cardiovascular;  Laterality: N/A;  . CARDIAC CATHETERIZATION N/A 10/13/2015   Procedure: Intravascular Ultrasound/IVUS;  Surgeon: Jettie Booze, MD;  Location: [place] CV LAB;  Service: Cardiovascular;  Laterality: N/A;  . CORONARY ANGIOPLASTY WITH STENT PLACEMENT  10/13/2015  . I & D EXTREMITY Right 09/19/2015   Procedure: IRRIGATION AND DEBRIDEMENT ANKLE LACERATIONS POSSIBLE TENDON REPAIR;  Surgeon:  Pel, MD;  Location: [place];  Service: Orthopedics;  Laterality: Right;  . I & D EXTREMITY Right 10/14/2015  Procedure: IRRIGATION AND DEBRIDEMENT EXTREMITY/Right Ankle;  Surgeon: Cammy Copa, MD;  Location: [facility] OR;  Service: Orthopedics;  Laterality: Right;    Family History: History reviewed. No pertinent family history.  Social History:  reports that he has never smoked. He has never  used smokeless tobacco. He reports current alcohol use. He reports current drug use. Drugs: Cocaine and Marijuana.  Additional Social History:  Alcohol / Drug Use Pain Medications: see MAR Prescriptions: see MAR Over the Counter: see MAR History of alcohol / drug use?: No history of alcohol / drug abuse  CIWA: CIWA-Ar BP: (!) 132/94 Pulse Rate: 62 COWS:    Allergies:  Allergies  Allergen Reactions  . 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 Heparin Itching    Full-body itching inc throat  . Pork-Derived Products Itching    Burning/itching    Home Medications: (Not in a hospital admission)   OB/GYN Status:  No LMP for male patient.  General Assessment Data Assessment unable to be completed: Yes Location of Assessment: [facility] ED TTS Assessment: In system Is this a Tele or Face-to-Face Assessment?: Tele Assessment Is this an Initial Assessment or a Re-assessment for this encounter?: Initial Assessment Patient Accompanied by:: N/A Language Other than English: No Living Arrangements: Homeless/Shelter What gender do you identify as?: Male Marital status: Long term relationship Maiden name: Magill Pregnancy Status: No Living Arrangements: Alone Can pt return to current living arrangement?: Yes Admission Status: Involuntary Petitioner: ED Attending Is patient capable of signing voluntary admission?: No Referral Source: Self/Family/Friend Insurance type: None     Crisis Care Plan Living Arrangements: Alone Legal Guardian: (self) Name of Psychiatrist: none Name of Therapist: none  Education Status Is patient currently in school?: No Is the patient employed, unemployed or receiving disability?: Unemployed  Risk to self with the past 6 months Suicidal Ideation: No Has patient been a risk to self within the past 6 months prior to admission? : No Suicidal Intent: No Has patient had any suicidal intent within the past 6 months prior to admission? : No Is patient at risk for  suicide?: No Suicidal Plan?: No Has patient had any suicidal plan within the past 6 months prior to admission? : No Access to Means: No What has been your use of drugs/alcohol within the last 12 months?: denies Previous Attempts/Gestures: No How many times?: 0 Other Self Harm Risks: none Triggers for Past Attempts: None known Intentional Self Injurious Behavior: None Family Suicide History: No Recent stressful life event(s): Legal Issues Persecutory voices/beliefs?: No Depression: No Depression Symptoms: Feeling angry/irritable Substance abuse history and/or treatment for substance abuse?: No Suicide prevention information given to non-admitted patients: Not applicable  Risk to Others within the past 6 months Homicidal Ideation: No Does patient have any lifetime risk of violence toward others beyond the six months prior to admission? : (denies) Thoughts of Harm to Others: No Current Homicidal Intent: No Current Homicidal Plan: No Access to Homicidal Means: No Identified Victim: none History of harm to others?: No Assessment of Violence: None Noted Violent Behavior Description: none Does patient have access to weapons?: No Criminal Charges Pending?: No Does patient have a court date: No Is patient on probation?: No  Psychosis Hallucinations: Auditory, Visual Delusions: Persecutory, Somatic  Mental Status Report Appearance/Hygiene: In scrubs Eye Contact: Good Motor Activity: Freedom of movement Speech: Tangential Level of Consciousness: Alert Mood: Anxious Affect: Anxious Anxiety Level: Moderate Thought Processes: Flight of Ideas, Tangential Judgement: Impaired Orientation: Person, Place, Time, Situation Obsessive Compulsive Thoughts/Behaviors: None  Cognitive Functioning Concentration: Poor Memory: Unable to Assess Is patient IDD: No Insight: Poor Impulse Control: Poor Appetite: Good Have you had any weight changes? : No Change Sleep: No Change Total Hours  of Sleep: 8 Vegetative Symptoms: None  ADLScreening [facility] Assessment Services) Patient's cognitive ability adequate to safely complete daily activities?: Yes Patient able to express need for assistance with ADLs?: Yes Independently performs ADLs?: Yes (appropriate for developmental age)  Prior Inpatient Therapy Prior Inpatient Therapy: Yes Prior Therapy Dates: 2019 Prior Therapy Facilty/Provider(s): [facility] Reason for Treatment: schizoaffective  Prior Outpatient Therapy Prior Outpatient Therapy: No Does patient have an ACCT team?: No Does patient have Intensive In-House Services?  : No Does patient have [facility] services? : No Does patient have P4CC services?: No  ADL Screening (condition at time of admission) Patient's cognitive ability adequate to safely complete daily activities?: Yes Is the patient deaf or have difficulty hearing?: No Does the patient have difficulty seeing, even when wearing glasses/contacts?: No Does the patient have difficulty concentrating, remembering, or making decisions?: No Patient able to express need for assistance with ADLs?: Yes Does the patient have difficulty dressing or bathing?: No Independently performs ADLs?: Yes (appropriate for developmental age) Does the patient have difficulty walking or climbing stairs?: No Weakness of Legs: None Weakness of Arms/Hands: None  Home Assistive Devices/Equipment Home Assistive Devices/Equipment: None  Therapy Consults (therapy consults require a physician order) PT Evaluation Needed: No OT Evalulation Needed: No SLP Evaluation Needed: No Abuse/Neglect Assessment (Assessment to be complete while patient is alone) Abuse/Neglect Assessment Can Be Completed: Unable to assess, patient is non-responsive or altered mental status     Advance Directives (For Healthcare) Does Patient Have a Medical Advance Directive?: Unable to assess, patient is non-responsive or altered mental status Would patient like  information on creating a medical advance directive?: No - Patient declined          Disposition: Per Dr. [name] patient meets in patient criteria.  Disposition Initial Assessment Completed for this Encounter: Yes  This service was provided via telemedicine using a 2-way, interactive audio and video technology.  Names of all persons participating in this telemedicine service and their role in this encounter. Name: Oren Binet Role: patient  Name: Celedonio Miyamoto, LCSW Role: TTS  Name:  Role:   Name:  Role:     Celedonio Miyamoto 05/02/2019 2:25 PM

## 2019-05-02 NOTE — ED Notes (Addendum)
Pt given Sprite - advised pt he may have another one w/dinner as he has eaten lunch, [place] sandwiches x 2 and multiple graham crackers w/multiple Sprites. Pt states he is weak and needs to get his energy back and the food we are giving him is not helping. Pt noted to be delusional and paranoid. Pt took Ativan after stating "there had not better be any narcotics in this to make me sleepy. I am going in and out of consciousness already". Door to room closed as requested - blinds are open - Sitter w/pt.

## 2019-05-02 NOTE — Progress Notes (Signed)
Patient meets inpatient criteria per Dr. [name]. Patient has been faxed out to the following facilities for review:   [facility]  [facility]  [facility]  [facility]  [facility]  CSW will continue to follow and assist with finding bed placement.   Drucilla Schmidt, MSW, LCSW-A Clinical Disposition Social Worker [facility]/TTS [phone]

## 2019-05-02 NOTE — ED Notes (Signed)
3 bags of pt belongings placed in locker 6 in purple zone

## 2019-05-02 NOTE — ED Notes (Signed)
Pt finally allowed another Nurse to place a n IV and get vitals. Pt had refused since the beginning but has greed to cooperate at this time

## 2019-05-02 NOTE — ED Provider Notes (Signed)
Signed out to me.  Will need TTS after labs.  CK is elevated.  Has had rhabdo before.  Will repeat CK in 6 hours to ensure it is not uptrending.  If stable, plan for TTS evaluation.   6:34 AM CK is down trending.    Feel that patient is medically clear for TTS evaluation.   Roxy Horseman, PA-C 05/02/19 [id number]    Marily Memos, MD 05/02/19 [phone]

## 2019-05-02 NOTE — ED Notes (Signed)
Pt noted to be delusional - states he came to [facility] d/t "I just needed to be checked out and the staff always help me". Then states he became upset d/t the room he was in was dirty and it wasn't at [facility] staff caring for him. States "I am a replicate of myself and my blood is usually oily and greasy but it wasn't". Pt given Sprite x 2 as requested. TTS aware pt is awake.

## 2019-05-02 NOTE — ED Notes (Signed)
Pt talking w/spouse on phone at nurses' desk.  

## 2019-05-03 ENCOUNTER — Encounter (HOSPITAL_COMMUNITY): Payer: Self-pay | Admitting: Psychiatry

## 2019-05-03 ENCOUNTER — Other Ambulatory Visit: Payer: Self-pay

## 2019-05-03 ENCOUNTER — Inpatient Hospital Stay (HOSPITAL_COMMUNITY)
Admission: AD | Admit: 2019-05-03 | Discharge: 2019-05-06 | DRG: 885 | Disposition: A | Discharge status: Home / Self Care | Payer: Federal, State, Local not specified - Other | Source: Intra-hospital | Attending: Psychiatry | Admitting: Psychiatry

## 2019-05-03 DIAGNOSIS — E7800 Pure hypercholesterolemia, unspecified: Secondary | ICD-10-CM | POA: Diagnosis present

## 2019-05-03 DIAGNOSIS — Z86718 Personal history of other venous thrombosis and embolism: Secondary | ICD-10-CM | POA: Diagnosis not present

## 2019-05-03 DIAGNOSIS — Z7982 Long term (current) use of aspirin: Secondary | ICD-10-CM | POA: Diagnosis not present

## 2019-05-03 DIAGNOSIS — Z955 Presence of coronary angioplasty implant and graft: Secondary | ICD-10-CM

## 2019-05-03 DIAGNOSIS — F14159 Cocaine abuse with cocaine-induced psychotic disorder, unspecified: Secondary | ICD-10-CM | POA: Diagnosis present

## 2019-05-03 DIAGNOSIS — Z9119 Patient's noncompliance with other medical treatment and regimen: Secondary | ICD-10-CM

## 2019-05-03 DIAGNOSIS — N179 Acute kidney failure, unspecified: Secondary | ICD-10-CM | POA: Diagnosis present

## 2019-05-03 DIAGNOSIS — Z91013 Allergy to seafood: Secondary | ICD-10-CM | POA: Diagnosis not present

## 2019-05-03 DIAGNOSIS — Z9114 Patient's other noncompliance with medication regimen: Secondary | ICD-10-CM | POA: Diagnosis not present

## 2019-05-03 DIAGNOSIS — I5032 Chronic diastolic (congestive) heart failure: Secondary | ICD-10-CM | POA: Diagnosis present

## 2019-05-03 DIAGNOSIS — M6282 Rhabdomyolysis: Secondary | ICD-10-CM | POA: Diagnosis present

## 2019-05-03 DIAGNOSIS — G4700 Insomnia, unspecified: Secondary | ICD-10-CM | POA: Diagnosis present

## 2019-05-03 DIAGNOSIS — F250 Schizoaffective disorder, bipolar type: Principal | ICD-10-CM | POA: Diagnosis present

## 2019-05-03 DIAGNOSIS — M1990 Unspecified osteoarthritis, unspecified site: Secondary | ICD-10-CM | POA: Diagnosis present

## 2019-05-03 DIAGNOSIS — Z79899 Other long term (current) drug therapy: Secondary | ICD-10-CM | POA: Diagnosis not present

## 2019-05-03 DIAGNOSIS — E785 Hyperlipidemia, unspecified: Secondary | ICD-10-CM | POA: Diagnosis present

## 2019-05-03 DIAGNOSIS — I2510 Atherosclerotic heart disease of native coronary artery without angina pectoris: Secondary | ICD-10-CM | POA: Diagnosis present

## 2019-05-03 DIAGNOSIS — Z91018 Allergy to other foods: Secondary | ICD-10-CM

## 2019-05-03 DIAGNOSIS — G92 Toxic encephalopathy: Secondary | ICD-10-CM | POA: Diagnosis present

## 2019-05-03 DIAGNOSIS — Z888 Allergy status to other drugs, medicaments and biological substances status: Secondary | ICD-10-CM

## 2019-05-03 DIAGNOSIS — I110 Hypertensive heart disease with heart failure: Secondary | ICD-10-CM | POA: Diagnosis present

## 2019-05-03 DIAGNOSIS — R45851 Suicidal ideations: Secondary | ICD-10-CM | POA: Diagnosis present

## 2019-05-03 DIAGNOSIS — I252 Old myocardial infarction: Secondary | ICD-10-CM | POA: Diagnosis not present

## 2019-05-03 DIAGNOSIS — Z20822 Contact with and (suspected) exposure to covid-19: Secondary | ICD-10-CM | POA: Diagnosis present

## 2019-05-03 MED ORDER — ZIPRASIDONE MESYLATE 20 MG IM SOLR: 20.0000 mg | Freq: Four times a day (QID) | INTRAMUSCULAR | Status: DC | PRN

## 2019-05-03 MED ORDER — GABAPENTIN 300 MG PO CAPS
300.0000 mg | ORAL_CAPSULE | Freq: Three times a day (TID) | ORAL | Status: DC
  Administered 2019-05-04 – 2019-05-06 (×4): 300 mg via ORAL
  Filled 2019-05-03 (×3): qty 21
  Filled 2019-05-03 (×3): qty 1
  Filled 2019-05-03: qty 21
  Filled 2019-05-03 (×4): qty 1
  Filled 2019-05-03: qty 21
  Filled 2019-05-03 (×4): qty 1
  Filled 2019-05-03: qty 21

## 2019-05-03 MED ORDER — ALUM & MAG HYDROXIDE-SIMETH 200-200-20 MG/5ML PO SUSP: 30.0000 mL | ORAL | Status: DC | PRN

## 2019-05-03 MED ORDER — TEMAZEPAM 30 MG PO CAPS
30.0000 mg | ORAL_CAPSULE | Freq: Every day | ORAL | Status: DC
  Administered 2019-05-05: 30 mg via ORAL
  Filled 2019-05-03: qty 1

## 2019-05-03 MED ORDER — DIPHENHYDRAMINE HCL 25 MG PO CAPS: 50.0000 mg | ORAL_CAPSULE | Freq: Four times a day (QID) | ORAL | Status: DC | PRN

## 2019-05-03 MED ORDER — OLANZAPINE 5 MG PO TBDP
15.0000 mg | ORAL_TABLET | Freq: Two times a day (BID) | ORAL | Status: DC
  Filled 2019-05-03 (×8): qty 1

## 2019-05-03 MED ORDER — ACETAMINOPHEN 325 MG PO TABS: 650.0000 mg | ORAL_TABLET | Freq: Four times a day (QID) | ORAL | Status: DC | PRN

## 2019-05-03 MED ORDER — HYDROXYZINE HCL 50 MG PO TABS: 50.0000 mg | ORAL_TABLET | ORAL | Status: DC | PRN

## 2019-05-03 MED ORDER — MAGNESIUM HYDROXIDE 400 MG/5ML PO SUSP: 30.0000 mL | Freq: Every day | ORAL | Status: DC | PRN

## 2019-05-03 NOTE — Progress Notes (Signed)
   05/03/19 2225  Psych Admission Type (Psych Patients Only)  Admission Status Involuntary  Psychosocial Assessment  Patient Complaints Other (Comment) (pt doesn't want to be here, pressured speech)  Eye Contact Avoids  Facial Expression Anxious  Affect Anxious;Preoccupied  Speech Tangential;Pressured;Rapid  Interaction Minimal  Motor Activity Restless  Appearance/Hygiene In scrubs;Poor hygiene  Behavior Characteristics Anxious  Mood Anxious;Preoccupied  Thought Process  Coherency Circumstantial;Disorganized;Tangential;Loose associations  Content Blaming others;Paranoia;Preoccupation  Delusions Paranoid  Perception Depersonalization  Hallucination Auditory (talking to himself )  Judgment Limited  Confusion Mild

## 2019-05-03 NOTE — Progress Notes (Signed)
Pt accepted to [facility]; bed 505-1  [name], NP is the accepting provider.    Dr. [name] is the attending provider.    Call report to [phone]    April @ [facility] ED notified.     Pt is involuntary and will be transported by law enforcement.    Pt is scheduled to arrive at [facility] at 2pm.   [name], LCSW, LCAS Disposition CSW [facility]/TTS [phone]

## 2019-05-03 NOTE — ED Notes (Signed)
[facility] contacted for transport to [facility]

## 2019-05-03 NOTE — ED Notes (Signed)
Breakfast tray given to pt. Currently alert and eating. 

## 2019-05-03 NOTE — Progress Notes (Signed)
Admission Note: Patient is a 49 year old male admitted to the unit for symptoms of psychosis due to substance abuse.  UDS positive for amphetamines and Cocaine.  Patient presents with irritable affect and mood.  Speech is pressured and incoherent.  Appears disorganized with tangential thoughts process.  Refused to sign consent form and unable to participate in admission process.  Patient transferred to the unit in a wheel chair due to sedation and unsteady gait.  Routine safety checks initiated.  Patient is in his room sleeping.

## 2019-05-03 NOTE — H&P (Signed)
Psychiatric Admission Assessment Adult  Patient Identification: Brett Salinas MRN:  [id number] Date of Evaluation:  05/03/2019 Chief Complaint:  Schizoaffective disorder, bipolar type Principal Diagnosis: Exacerbation of psychotic disorder/presumed noncompliance/history of extensive substance abuse Diagnosis:  Active Problems:   Schizoaffective disorder, bipolar type ([place])  History of Present Illness:   This is the latest of multiple admissions and healthcare system encounters for Brett Salinas-he is a 49 year old patient with a known schizoaffective type condition, complicated by cardiac disease, polysubstance abuse, and chronic noncompliance.  He has had multiple hospital and emergency room presentations related to psychosis/substance-induced psychosis, and he presented on 3/31 after he had been released from a recent incarceration, he was delusional, had a very high CK, and was treated in the emergency department for medical stabilization.  4/1 he was refusing aspects of care, pulled out his IV, required chemical restraint.  He was felt to be psychotic secondary to substance use in combination with lack of treatment for his psychotic disorder, was described as "hallucinating not redirectable hostile toward staff by the morning hours of 4/1. By the evening he was on lorazepam as needed and Zyprexa Zydis.  Patient spouse spoke to ER nurses on 4/1 informing that the patient lives on [facility], he came to [place] but "lost his truck" and phoned her with delusional believes that he was staying in a "contaminated room" she elaborated he had been off of his medications, refusing to take them and believes he throws them away.  The patient himself cannot state why he came to [place], he rambles, he states "they are giving me drugs I do not know his Thorazine," then he rambles about the police and its rather incoherent states "what are you clicking" and I ask him if he is hearing things,  the interview is very difficult because sometimes he simply refuses to answer questions.  He is irritable he refused to have his belongings searched and so forth but is generally redirectable. UDS positive for cocaine - methamphetamines - 05/02/2019  Follow-up w wife for collateral- she elaborates that he was homeless when he moved in w/ her- met at church- she is a Equities trader citizen-she confirms that he throws away his medication and abuses drugs.    Associated Signs/Symptoms: Depression Symptoms:  insomnia, psychomotor agitation, difficulty concentrating, (Hypo) Manic Symptoms:  Delusions, Distractibility, Anxiety Symptoms:  n/a Psychotic Symptoms:  Delusions, PTSD Symptoms: NA Total Time spent with patient: 45 minutes  Past Psychiatric History:  Numerous similar presentations again has a chronic psychotic disorder complicated by substance abuse and medical comorbidities.  For example, From admission note of August/4178 49 year old male with PMH of schizoaffective/bipolar disorder,NSTEMI,CAD status post PCI, chronic diastolic CHF, HTN, HLD, polysubstance abuse including cocaine and medication noncompliance, [facility] was called to patient's hotel room due to agitation and aggression after reportedly consuming alcohol, cocaine, Percocet, heroin and ecstasy, was given Versed and Haldol onsite and admitted for acute toxic metabolic encephalopathy, acute renal failure, rhabdomyolysis, elevated troponin. Some concern regarding suicidal ideations and hence was IVC by EDP.  Psychiatry consulted Admission note of 12/2016 patient was brought to [facility] ER utterly confused, apparently walking around for two days. He was positive for amphetamines and initially was thought to be in withdrawals. He was briefly admitted to medicine for altered mental status.He does have a history of schizophrenia and has not been compliant with medications of Zyprexa andTegretol astheymake his sleepy. In the ER, he was  clearly psychotic, paranoid, delusional, hallucinating and agitated. He felt the government  is after him to eliminate people with mental illness. He was unable to answer many questions. He has a history of substance use including cocaine. Is the patient at risk to self? Yes.    Has the patient been a risk to self in the past 6 months? Yes.    Has the patient been a risk to self within the distant past? Yes.    Is the patient a risk to others? Yes.    Has the patient been a risk to others in the past 6 months? No.  Has the patient been a risk to others within the distant past? Yes.     Prior Inpatient Therapy:   See old chart there are several similar presentations past treatments included olanzapine and long-acting injectable aripiprazole Prior Outpatient Therapy:  Patient denies reports there have been appointments made Alcohol Screening:   Substance Abuse History in the last 12 months:  Yes.   Consequences of Substance Abuse: Negative Previous Psychotropic Medications: Yes  Psychological Evaluations: No  Past Medical History:  Past Medical History:  Diagnosis Date  . Achilles tendon rupture-surgery 09/19/15 09/19/2015  . Altered mental status 12/23/2016  . Arthritis    "hips, knees, back" (10/13/2015)  . Bacterial conjunctivitis of right eye 2018  . Bipolar disorder ([place])   . CAD -S/P PCI LAD/DES 10/13/15 10/14/2015  . CHF (congestive heart failure) ([place])   . Cocaine abuse-drug sceen positive, History of 10/14/2015  . Coronary artery disease   . Depression   . DVT (deep venous thrombosis) ([place]) 09/2015   right  . Essential hypertension 11/30/2015  . Heart murmur   . High cholesterol   . Hypertension   . Myocardial infarction ([place]) 10/12/2015 X 2-3  . NSTEMI (non-ST elevated myocardial infarction) ([place])   . Osteoarthritis   . Rectal myiasis   . Schizoaffective disorder, bipolar type ([place]) 10/14/2015  . Schizoaffective disorder, bipolar type ([place])   . Sciatica     Past Surgical  History:  Procedure Laterality Date  . CARDIAC CATHETERIZATION N/A 10/13/2015   Procedure: Left Heart Cath and Coronary Angiography;  Surgeon: Jettie Booze, MD;  Location: [facility];  Service: Cardiovascular;  Laterality: N/A;  . CARDIAC CATHETERIZATION N/A 10/13/2015   Procedure: Coronary Stent Intervention;  Surgeon: Jettie Booze, MD;  Location: [facility];  Service: Cardiovascular;  Laterality: N/A;  . CARDIAC CATHETERIZATION N/A 10/13/2015   Procedure: Intravascular Ultrasound/IVUS;  Surgeon: Jettie Booze, MD;  Location: [facility];  Service: Cardiovascular;  Laterality: N/A;  . CORONARY ANGIOPLASTY WITH STENT PLACEMENT  10/13/2015  . I & D EXTREMITY Right 09/19/2015   Procedure: IRRIGATION AND DEBRIDEMENT ANKLE LACERATIONS POSSIBLE TENDON REPAIR;  Surgeon: Meredith Pel, MD;  Location: [facility];  Service: Orthopedics;  Laterality: Right;  . I & D EXTREMITY Right 10/14/2015   Procedure: IRRIGATION AND DEBRIDEMENT EXTREMITY/Right Ankle;  Surgeon: Meredith Pel, MD;  Location: [place] [facility];  Service: Orthopedics;  Laterality: Right;   Family History: No family history on file. Family Psychiatric  History: ukn Tobacco Screening:   Social History:  Social History   Substance and Sexual Activity  Alcohol Use Yes     Social History   Substance and Sexual Activity  Drug Use Yes  . Types: Cocaine, Marijuana   Comment: 10/13/2015 "I've tried alot; I don't have any habits"    Additional Social History:  Allergies:   Allergies  Allergen Reactions  . 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 Heparin Itching    Full-body itching inc throat  . Pork-Derived Products Itching    Burning/itching   Lab Results:  Results for orders placed or performed during the hospital encounter of 05/01/19 (from the past 48 hour(s))  CBC with Differential     Status: None   Collection Time: 05/01/19 10:11 PM  Result Value Ref  Range   WBC 6.9 4.0 - 10.5 K/uL   RBC 4.53 4.22 - 5.81 MIL/uL   Hemoglobin 13.4 13.0 - 17.0 g/dL   HCT 41.6 39.0 - 52.0 %   MCV 91.8 80.0 - 100.0 fL   MCH 29.6 26.0 - 34.0 pg   MCHC 32.2 30.0 - 36.0 g/dL   RDW 13.2 11.5 - 15.5 %   Platelets 240 150 - 400 K/uL   nRBC 0.0 0.0 - 0.2 %   Neutrophils Relative % 55 %   Neutro Abs 3.8 1.7 - 7.7 K/uL   Lymphocytes Relative 36 %   Lymphs Abs 2.5 0.7 - 4.0 K/uL   Monocytes Relative 5 %   Monocytes Absolute 0.3 0.1 - 1.0 K/uL   Eosinophils Relative 3 %   Eosinophils Absolute 0.2 0.0 - 0.5 K/uL   Basophils Relative 1 %   Basophils Absolute 0.0 0.0 - 0.1 K/uL   Immature Granulocytes 0 %   Abs Immature Granulocytes 0.02 0.00 - 0.07 K/uL    Comment: Performed at [facility], [address], [place], [place] [address]  Comprehensive metabolic panel     Status: Abnormal   Collection Time: 05/01/19 10:11 PM  Result Value Ref Range   Sodium 142 135 - 145 mmol/L   Potassium 4.2 3.5 - 5.1 mmol/L   Chloride 105 98 - 111 mmol/L   CO2 29 22 - 32 mmol/L   Glucose, Bld 109 (H) 70 - 99 mg/dL    Comment: Glucose reference range applies only to samples taken after fasting for at least 8 hours.   BUN 18 6 - 20 mg/dL   Creatinine, Ser 1.54 (H) 0.61 - 1.24 mg/dL   Calcium 9.1 8.9 - 10.3 mg/dL   Total Protein 6.3 (L) 6.5 - 8.1 g/dL   Albumin 3.7 3.5 - 5.0 g/dL   AST 45 (H) 15 - 41 U/L   ALT 20 0 - 44 U/L   Alkaline Phosphatase 58 38 - 126 U/L   Total Bilirubin 1.1 0.3 - 1.2 mg/dL   GFR calc non Af Amer 52 (L) >60 mL/min   GFR calc Af Amer >60 >60 mL/min   Anion gap 8 5 - 15    Comment: Performed at [facility] [address], [place], [place] [address]  CK     Status: Abnormal   Collection Time: 05/01/19 10:11 PM  Result Value Ref Range   Total CK 1,252 (H) 49 - 397 U/L    Comment: Performed at [facility], [place] [address] [place], [place] [address]  Ethanol     Status: None   Collection Time: 05/01/19 10:14 PM  Result Value  Ref Range   Alcohol, Ethyl (B) <10 <10 mg/dL    Comment: (NOTE) Lowest detectable limit for serum alcohol is 10 mg/dL. For medical purposes only. Performed at [facility], [place] [address] [place], [place] [address]   Salicylate level     Status: Abnormal   Collection Time: 05/01/19 10:14 PM  Result  Value Ref Range   Salicylate Lvl <9.3 (L) 7.0 - 30.0 mg/dL    Comment: Performed at [facility] [address] [place], [place] [address]  Acetaminophen level     Status: Abnormal   Collection Time: 05/01/19 10:14 PM  Result Value Ref Range   Acetaminophen (Tylenol), Serum <10 (L) 10 - 30 ug/mL    Comment: (NOTE) Therapeutic concentrations vary significantly. A range of 10-30 ug/mL  may be an effective concentration for many patients. However, some  are best treated at concentrations outside of this range. Acetaminophen concentrations >150 ug/mL at 4 hours after ingestion  and >50 ug/mL at 12 hours after ingestion are often associated with  toxic reactions. Performed at [facility], [place] [address], [place], [place] [address]   CK     Status: Abnormal   Collection Time: 05/02/19  4:00 AM  Result Value Ref Range   Total CK 1,015 (H) 49 - 397 U/L    Comment: Performed at [facility], [place] [address] [place], [place] [address]  Respiratory Panel by RT PCR (Flu A&B, Covid) - Nasopharyngeal Swab     Status: None   Collection Time: 05/02/19  7:18 AM   Specimen: Nasopharyngeal Swab  Result Value Ref Range   SARS Coronavirus 2 by RT PCR NEGATIVE NEGATIVE    Comment: (NOTE) SARS-CoV-2 target nucleic acids are NOT DETECTED. The SARS-CoV-2 RNA is generally detectable in upper respiratoy specimens during the acute phase of infection. The lowest concentration of SARS-CoV-2 viral copies this assay can detect is 131 copies/mL. A negative result does not preclude SARS-Cov-2 infection and should not be used as the sole basis for treatment or other patient management  decisions. A negative result may occur with  improper specimen collection/handling, submission of specimen other than nasopharyngeal swab, presence of viral mutation(s) within the areas targeted by this assay, and inadequate number of viral copies (<131 copies/mL). A negative result must be combined with clinical observations, patient history, and epidemiological information. The expected result is Negative. Fact Sheet for Patients:  PinkCheek.be Fact Sheet for Healthcare Providers:  GravelBags.it This test is not yet ap proved or cleared by the [place] FDA and  has been authorized for detection and/or diagnosis of SARS-CoV-2 by FDA under an Emergency Use Authorization (EUA). This EUA will remain  in effect (meaning this test can be used) for the duration of the COVID-19 declaration under Section 564(b)(1) of the Act, 21 U.S.C. section 360bbb-3(b)(1), unless the authorization is terminated or revoked sooner.    Influenza A by PCR NEGATIVE NEGATIVE   Influenza B by PCR NEGATIVE NEGATIVE    Comment: (NOTE) The Xpert Xpress SARS-CoV-2/FLU/RSV assay is intended as an aid in  the diagnosis of influenza from Nasopharyngeal swab specimens and  should not be used as a sole basis for treatment. Nasal washings and  aspirates are unacceptable for Xpert Xpress SARS-CoV-2/FLU/RSV  testing. Fact Sheet for Patients: PinkCheek.be Fact Sheet for Healthcare Providers: GravelBags.it This test is not yet approved or cleared by the [place] FDA and  has been authorized for detection and/or diagnosis of SARS-CoV-2 by  FDA under an Emergency Use Authorization (EUA). This EUA will remain  in effect (meaning this test can be used) for the duration of the  Covid-19 declaration under Section 564(b)(1) of the Act, 21  U.S.C. section 360bbb-3(b)(1), unless the authorization is   terminated or revoked. Performed at [facility], [place] [address] [place], [place]  [address]   Rapid urine drug screen (hospital performed)     Status: Abnormal   Collection Time: 05/02/19  4:00 PM  Result Value Ref Range   Opiates NONE DETECTED NONE DETECTED   Cocaine POSITIVE (A) NONE DETECTED   Benzodiazepines NONE DETECTED NONE DETECTED   Amphetamines POSITIVE (A) NONE DETECTED   Tetrahydrocannabinol NONE DETECTED NONE DETECTED   Barbiturates NONE DETECTED NONE DETECTED    Comment: (NOTE) DRUG SCREEN FOR MEDICAL PURPOSES ONLY.  IF CONFIRMATION IS NEEDED FOR ANY PURPOSE, NOTIFY LAB WITHIN 5 DAYS. LOWEST DETECTABLE LIMITS FOR URINE DRUG SCREEN Drug Class                     Cutoff (ng/mL) Amphetamine and metabolites    1000 Barbiturate and metabolites    200 Benzodiazepine                 191 Tricyclics and metabolites     300 Opiates and metabolites        300 Cocaine and metabolites        300 THC                            50 Performed at [facility], [place] [address], [place], [place] [address]   Urinalysis, Routine w reflex microscopic     Status: None   Collection Time: 05/02/19  4:00 PM  Result Value Ref Range   Color, Urine YELLOW YELLOW   APPearance CLEAR CLEAR   Specific Gravity, Urine 1.029 1.005 - 1.030   pH 5.0 5.0 - 8.0   Glucose, UA NEGATIVE NEGATIVE mg/dL   Hgb urine dipstick NEGATIVE NEGATIVE   Bilirubin Urine NEGATIVE NEGATIVE   Ketones, ur NEGATIVE NEGATIVE mg/dL   Protein, ur NEGATIVE NEGATIVE mg/dL   Nitrite NEGATIVE NEGATIVE   Leukocytes,Ua NEGATIVE NEGATIVE    Comment: Performed at [facility] [address], [place], [place] [address]    Blood Alcohol level:  Lab Results  Component Value Date   ETH <10 05/01/2019   ETH <10 08/65/7846    Metabolic Disorder Labs:  Lab Results  Component Value Date   HGBA1C 5.6 10/12/2015   MPG 114 10/12/2015   No results found for: PROLACTIN Lab Results  Component Value Date    CHOL 195 03/11/2019   TRIG 32 03/11/2019   HDL 84 03/11/2019   CHOLHDL 2.3 03/11/2019   VLDL 12 12/28/2016   LDLCALC 104 (H) 03/11/2019   LDLCALC 97 12/28/2016    Current Medications: Current Facility-Administered Medications  Medication Dose Route Frequency Provider Last Rate Last Admin  . acetaminophen (TYLENOL) tablet 650 mg  650 mg Oral Q6H PRN Johnn Hai, MD      . alum & mag hydroxide-simeth (MAALOX/MYLANTA) 200-200-20 MG/5ML suspension 30 mL  30 mL Oral Q4H PRN Johnn Hai, MD      . diphenhydrAMINE (BENADRYL) capsule 50 mg  50 mg Oral Q6H PRN Johnn Hai, MD      . gabapentin (NEURONTIN) capsule 300 mg  300 mg Oral TID Johnn Hai, MD      . hydrOXYzine (ATARAX/VISTARIL) tablet 50 mg  50 mg Oral Q4H PRN Johnn Hai, MD      . magnesium hydroxide (MILK OF MAGNESIA) suspension 30 mL  30 mL Oral Daily PRN Johnn Hai, MD      . OLANZapine zydis (ZYPREXA) disintegrating tablet 15 mg  15 mg Oral BID Johnn Hai,  MD      . temazepam (RESTORIL) capsule 30 mg  30 mg Oral QHS Johnn Hai, MD      . ziprasidone (GEODON) injection 20 mg  20 mg Intramuscular Q6H PRN Johnn Hai, MD       PTA Medications: Medications Prior to Admission  Medication Sig Dispense Refill Last Dose  . aspirin EC 81 MG tablet Take 1 tablet (81 mg total) by mouth daily. (Patient not taking: Reported on 05/02/2019) 30 tablet 0   . atorvastatin (LIPITOR) 40 MG tablet Take 1 tablet (40 mg total) by mouth daily. (Patient not taking: Reported on 05/02/2019) 90 tablet 3   . cyclobenzaprine (FLEXERIL) 10 MG tablet Take 1 tablet (10 mg total) by mouth 2 (two) times daily as needed for muscle spasms. (Patient not taking: Reported on 05/02/2019) 20 tablet 0   . diclofenac Sodium (VOLTAREN) 1 % GEL Apply 4 g topically 4 (four) times daily. (Patient not taking: Reported on 05/02/2019) 100 g 0   . losartan (COZAAR) 25 MG tablet Take 1 tablet (25 mg total) by mouth daily. (Patient not taking: Reported on 05/02/2019) 90 tablet  3     Musculoskeletal: Strength & Muscle Tone: within normal limits Gait & Station: normal Patient leans: N/A  Psychiatric Specialty Exam: Physical Exam  Nursing note and vitals reviewed. Constitutional: He appears well-developed and well-nourished.  Cardiovascular: Normal rate and regular rhythm.    Review of Systems  Constitutional: Negative.   Eyes: Negative.   Respiratory: Negative.   Cardiovascular: Negative.   Gastrointestinal: Negative.   Endocrine: Negative.   Genitourinary: Negative.   Musculoskeletal: Negative.   Neurological: Negative.   high CK  There were no vitals taken for this visit.There is no height or weight on file to calculate BMI.  General Appearance: Disheveled  Eye Contact:  Poor  Speech:  Pressured and Slurred  Volume:  Increased  Mood:  Irritable  Affect:  Blunt  Thought Process:  Disorganized  Orientation:  Other:  Person place and general situation  Thought Content:  Illogical and Delusions  Suicidal Thoughts:  No  Homicidal Thoughts:  No  Memory:  Immediate;   Poor Recent;   Poor Remote;   Fair  Judgement:  Impaired  Insight:  Shallow  Psychomotor Activity:  Decreased  Concentration:  Concentration: Poor and Attention Span: Poor  Recall:  Poor  Fund of Knowledge:  Poor  Language:  Fair  Akathisia:  Negative  Handed:  Right  AIMS (if indicated):     Assets:  Communication Skills Desire for Improvement  ADL's:  Intact  Cognition:  WNL  Sleep:       Treatment Plan Summary: Daily contact with patient to assess and evaluate symptoms and progress in treatment and Medication management  Observation Level/Precautions:  15 minute checks  Laboratory:  UDS  Psychotherapy:  Reality based  Medications:  Cont- zyprexa  Consultations:  n/a  Discharge Concerns:  LAI needed  Estimated LOS: 7 day  Other:     Physician Treatment Plan for Primary Diagnosis:   I. SCHIZOAFFECTIVE D/O BP type polysubst abuse / meth cocaine II defer III  CAD  Resume olanzapine   Long Term Goal(s): Improvement in symptoms so as ready for discharge  Short Term Goals: Ability to identify changes in lifestyle to reduce recurrence of condition will improve, Ability to verbalize feelings will improve, Ability to disclose and discuss suicidal ideas, Ability to demonstrate self-control will improve, Ability to identify and develop effective coping behaviors will improve  and Ability to maintain clinical measurements within normal limits will improve  Physician Treatment Plan for Secondary Diagnosis: Active Problems:   Schizoaffective disorder, bipolar type ([place])  Long Term Goal(s): Improvement in symptoms so as ready for discharge  Short Term Goals: Ability to identify changes in lifestyle to reduce recurrence of condition will improve, Ability to verbalize feelings will improve, Ability to disclose and discuss suicidal ideas, Ability to demonstrate self-control will improve and Ability to identify and develop effective coping behaviors will improve  I certify that inpatient services furnished can reasonably be expected to improve the patient's condition.    Johnn Hai, MD 4/2/20213:31 PM

## 2019-05-03 NOTE — [facility] Suicide Risk Assessment (Signed)
[facility] Admission Suicide Risk Assessment   Total Time spent with patient: 45 minutes Principal Problem: <principal problem not specified> Diagnosis:  Active Problems:   Schizoaffective disorder, bipolar type (H[facility])  Subjective Data: Admitted for meth and cocaine induced psychosis driving erratically incarcerated all in the context of a schizoaffective condition  Continued Clinical Symptoms:    The "Alcohol Use Disorders Identification Test", Guidelines for Use in Primary Care, Second Edition.  World Science writer [facility]). Score between 0-7:  no or low risk or alcohol related problems. Score between 8-15:  moderate risk of alcohol related problems. Score between 16-19:  high risk of alcohol related problems. Score 20 or above:  warrants further diagnostic evaluation for alcohol dependence and treatment.   CLINICAL FACTORS:   Previous Psychiatric Diagnoses and Treatments Musculoskeletal: Strength & Muscle Tone: within normal limits Gait & Station: normal Patient leans: N/A  Psychiatric Specialty Exam: Physical Exam  Nursing note and vitals reviewed. Constitutional: He appears well-developed and well-nourished.  Cardiovascular: Normal rate and regular rhythm.    Review of Systems  Constitutional: Negative.   Eyes: Negative.   Respiratory: Negative.   Cardiovascular: Negative.   Gastrointestinal: Negative.   Endocrine: Negative.   Genitourinary: Negative.   Musculoskeletal: Negative.   Neurological: Negative.   high CK  There were no vitals taken for this visit.There is no height or weight on file to calculate BMI.  General Appearance: Disheveled  Eye Contact:  Poor  Speech:  Pressured and Slurred  Volume:  Increased  Mood:  Irritable  Affect:  Blunt  Thought Process:  Disorganized  Orientation:  Other:  Person place and general situation  Thought Content:  Illogical and Delusions  Suicidal Thoughts:  No  Homicidal Thoughts:  No  Memory:  Immediate;   Poor Recent;    Poor Remote;   Fair  Judgement:  Impaired  Insight:  Shallow  Psychomotor Activity:  Decreased  Concentration:  Concentration: Poor and Attention Span: Poor  Recall:  Poor  Fund of Knowledge:  Poor  Language:  Fair  Akathisia:  Negative  Handed:  Right  AIMS (if indicated):     Assets:  Communication Skills Desire for Improvement  ADL's:  Intact  Cognition:  WNL  Sleep:         COGNITIVE FEATURES THAT CONTRIBUTE TO RISK:  Loss of executive function    SUICIDE RISK:   Mild:  Suicidal ideation of limited frequency, intensity, duration, and specificity.  There are no identifiable plans, no associated intent, mild dysphoria and related symptoms, good self-control (both objective and subjective assessment), few other risk factors, and identifiable protective factors, including available and accessible social support.  PLAN OF CARE: admit  I certify that inpatient services furnished can reasonably be expected to improve the patient's condition.   Malvin Johns, MD 05/03/2019, 3:50 PM

## 2019-05-03 NOTE — Progress Notes (Signed)
   05/03/19 2225  COVID-19 Daily Checkoff  Have you had a fever (temp > 37.80C/100F)  in the past 24 hours?  No  If you have had runny nose, nasal congestion, sneezing in the past 24 hours, has it worsened? No  COVID-19 EXPOSURE  Have you traveled outside the state in the past 14 days? No  Have you been in contact with someone with a confirmed diagnosis of COVID-19 or PUI in the past 14 days without wearing appropriate PPE? No  Have you been living in the same home as a person with confirmed diagnosis of COVID-19 or a PUI (household contact)? No  Have you been diagnosed with COVID-19? No

## 2019-05-03 NOTE — Progress Notes (Signed)
DID NOT ATTEND     SPIRITUALITY GROUP NOTE  Pt attended spirituality group facilitated by Chaplain Jerald Villalona, MDIv, BCC.  Group Description:  Group focused on topic of hope.  Patients participated in facilitated discussion around topic, connecting with one another around experiences and definitions for hope.  Group members engaged with visual explorer photos, reflecting on what hope looks like for them today.  Group engaged in discussion around how their definitions of hope are present today in hospital.   Modalities: Psycho-social ed, Adlerian, Narrative, MI Patient Progress:   

## 2019-05-03 NOTE — ED Notes (Signed)
ORDERED BREAKFAST--Brett Salinas  

## 2019-05-03 NOTE — Plan of Care (Signed)
This Clinical research associate spoke with Pt's nurse April to check on his presenting behaviors and progress. [facility] nursing staff reported that she'd had minimal interactions with the pt as he'd been resting since the start of the shift. Nursing explained that the pt did however get up briefly and ate breakfast. During this time it was reported that the pt presented as calm and cooperative .  Additionally, nursing reported that pt is fully continent and capable of independently completing his ADLs. Pt's nursing reported that the pt has no known documented intellectual disabilities. Pt is accepted to [facility] Room 505-01. Expected time of arrival 11am.   Rosalita Chessman Bed Placement/Flow Manager [facility] [phone]

## 2019-05-04 NOTE — Progress Notes (Signed)
   05/04/19 2115  Psych Admission Type (Psych Patients Only)  Admission Status Involuntary  Psychosocial Assessment  Patient Complaints Suspiciousness;Worrying;Anxiety  Eye Contact Avoids  Facial Expression Anxious  Affect Inconsistent with thought content;Preoccupied  Speech Tangential;Pressured;Rapid  Interaction Minimal  Motor Activity Restless  Appearance/Hygiene In scrubs;Poor hygiene  Behavior Characteristics Restless;Guarded;Fidgety;Anxious  Mood Preoccupied;Anxious  Thought Process  Coherency Circumstantial;Disorganized;Tangential;Loose associations  Content Blaming others;Paranoia;Preoccupation  Delusions Paranoid;Somatic  Perception Derealization  Hallucination None reported or observed  Judgment Limited  Confusion Mild  Danger to Self  Current suicidal ideation? Denies  Danger to Others  Danger to Others None reported or observed

## 2019-05-04 NOTE — Progress Notes (Signed)
   05/04/19 2115  COVID-19 Daily Checkoff  Have you had a fever (temp > 37.80C/100F)  in the past 24 hours?  No  If you have had runny nose, nasal congestion, sneezing in the past 24 hours, has it worsened? No  COVID-19 EXPOSURE  Have you traveled outside the state in the past 14 days? No  Have you been in contact with someone with a confirmed diagnosis of COVID-19 or PUI in the past 14 days without wearing appropriate PPE? No  Have you been living in the same home as a person with confirmed diagnosis of COVID-19 or a PUI (household contact)? No  Have you been diagnosed with COVID-19? No

## 2019-05-04 NOTE — Progress Notes (Signed)
Adult Psychoeducational Group Note  Date:  05/04/2019 Time:  1:15 AM  Group Topic/Focus:  Wrap-Up Group:   The focus of this group is to help patients review their daily goal of treatment and discuss progress on daily workbooks.  Participation Level:  Did Not Attend  Participation Quality:  Did Not Attend  Affect:  Did Not Attend  Cognitive:  Did Not Attend  Insight: None  Engagement in Group:  Did Not Attend  Modes of Intervention:  Did Not Attend  Additional Comments:  Pt did not attend evening wrap up group tonight.  Felipa Furnace 05/04/2019, 1:15 AM
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
## 2019-05-04 NOTE — Progress Notes (Signed)
[facility] MD Progress Note  05/04/2019 10:40 AM Brett Salinas  MRN:  [id number] Subjective:  "I'm well."  Brett Salinas found resting in his room. He is irritable, guarded, and poorly cooperative on assessment, only answering questions intermittently. He has been isolating to his room. He denies SI/HI/AVH. He shows no signs of responding to internal stimuli. He has been refusing medications. He states he will not take medications because they make him sleepy. He refuses to answer further questions. Per nursing report he has appeared to be responding to internal stimuli at times.  From admission H&P: This is the latest of multiple admissions and healthcare system encounters for Brett Salinas-he is a 49 year old patient with a known schizoaffective type condition, complicated by cardiac disease, polysubstance abuse, and chronic noncompliance.  He has had multiple hospital and emergency room presentations related to psychosis/substance-induced psychosis. Patient spouse spoke to ER nurses on 4/1 informing that the patient lives on [facility] [address] [facility] [address], he came to [place] but "lost his truck" and phoned her with delusional believes that he was staying in a "contaminated room"  Principal Problem: <principal problem not specified> Diagnosis: Active Problems:   Schizoaffective disorder, bipolar type (HCC)  Total Time spent with patient: 15 minutes  Past Psychiatric History: See admission H&P  Past Medical History:  Past Medical History:  Diagnosis Date  . Achilles tendon rupture-surgery 09/19/15 09/19/2015  . Altered mental status 12/23/2016  . Arthritis    "hips, knees, back" (10/13/2015)  . Bacterial conjunctivitis of right eye 2018  . Bipolar disorder (HCC)   . CAD -S/P PCI LAD/DES 10/13/15 10/14/2015  . CHF (congestive heart failure) (HCC)   . Cocaine abuse-drug sceen positive, History of 10/14/2015  . Coronary artery disease   . Depression   . DVT (deep venous thrombosis) (HCC) 09/2015   right  .  Essential hypertension 11/30/2015  . Heart murmur   . High cholesterol   . Hypertension   . Myocardial infarction (HCC) 10/12/2015 X 2-3  . NSTEMI (non-ST elevated myocardial infarction) (HCC)   . Osteoarthritis   . Rectal myiasis   . Schizoaffective disorder, bipolar type (HCC) 10/14/2015  . Schizoaffective disorder, bipolar type (HCC)   . Sciatica     Past Surgical History:  Procedure Laterality Date  . CARDIAC CATHETERIZATION N/A 10/13/2015   Procedure: Left Heart Cath and Coronary Angiography;  Surgeon: Corky Crafts, MD;  Location: [facility] INVASIVE CV LAB;  Service: Cardiovascular;  Laterality: N/A;  . CARDIAC CATHETERIZATION N/A 10/13/2015   Procedure: Coronary Stent Intervention;  Surgeon: Corky Crafts, MD;  Location: [facility] INVASIVE CV LAB;  Service: Cardiovascular;  Laterality: N/A;  . CARDIAC CATHETERIZATION N/A 10/13/2015   Procedure: Intravascular Ultrasound/IVUS;  Surgeon: Corky Crafts, MD;  Location: [facility] INVASIVE CV LAB;  Service: Cardiovascular;  Laterality: N/A;  . CORONARY ANGIOPLASTY WITH STENT PLACEMENT  10/13/2015  . I & D EXTREMITY Right 09/19/2015   Procedure: IRRIGATION AND DEBRIDEMENT ANKLE LACERATIONS POSSIBLE TENDON REPAIR;  Surgeon: Cammy Copa, MD;  Location: [facility] OR;  Service: Orthopedics;  Laterality: Right;  . I & D EXTREMITY Right 10/14/2015   Procedure: IRRIGATION AND DEBRIDEMENT EXTREMITY/Right Ankle;  Surgeon: Cammy Copa, MD;  Location: [facility] OR;  Service: Orthopedics;  Laterality: Right;   Family History: History reviewed. No pertinent family history. Family Psychiatric  History: See admission H&P Social History:  Social History   Substance and Sexual Activity  Alcohol Use Yes     Social History   Substance and Sexual Activity  Drug Use Yes  . Types: Cocaine, Marijuana   Comment: 10/13/2015 "I've tried alot; I don't have any habits"    Social History   Socioeconomic History  . Marital status: Single    Spouse name: Not on  file  . Number of children: Not on file  . Years of education: Not on file  . Highest education level: Not on file  Occupational History  . Not on file  Tobacco Use  . Smoking status: Never Smoker  . Smokeless tobacco: Never Used  Substance and Sexual Activity  . Alcohol use: Yes  . Drug use: Yes    Types: Cocaine, Marijuana    Comment: 10/13/2015 "I've tried alot; I don't have any habits"  . Sexual activity: Not on file  Other Topics Concern  . Not on file  Social History Narrative   ** Merged History Encounter **       Social Determinants of Health   Financial Resource Strain:   . Difficulty of Paying Living Expenses:   Food Insecurity:   . Worried About Charity fundraiser in the Last Year:   . Arboriculturist in the Last Year:   Transportation Needs:   . Film/video editor (Medical):   Marland Kitchen Lack of Transportation (Non-Medical):   Physical Activity:   . Days of Exercise per Week:   . Minutes of Exercise per Session:   Stress:   . Feeling of Stress :   Social Connections:   . Frequency of Communication with Friends and Family:   . Frequency of Social Gatherings with Friends and Family:   . Attends Religious Services:   . Active Member of Clubs or Organizations:   . Attends Archivist Meetings:   Marland Kitchen Marital Status:    Additional Social History:                         Sleep: Fair  Appetite:  Fair  Current Medications: Current Facility-Administered Medications  Medication Dose Route Frequency Provider Last Rate Last Admin  . acetaminophen (TYLENOL) tablet 650 mg  650 mg Oral Q6H PRN Johnn Hai, MD      . alum & mag hydroxide-simeth (MAALOX/MYLANTA) 200-200-20 MG/5ML suspension 30 mL  30 mL Oral Q4H PRN Johnn Hai, MD      . diphenhydrAMINE (BENADRYL) capsule 50 mg  50 mg Oral Q6H PRN Johnn Hai, MD      . gabapentin (NEURONTIN) capsule 300 mg  300 mg Oral TID Johnn Hai, MD      . hydrOXYzine (ATARAX/VISTARIL) tablet 50 mg  50 mg  Oral Q4H PRN Johnn Hai, MD      . magnesium hydroxide (MILK OF MAGNESIA) suspension 30 mL  30 mL Oral Daily PRN Johnn Hai, MD      . OLANZapine zydis (ZYPREXA) disintegrating tablet 15 mg  15 mg Oral BID Johnn Hai, MD      . temazepam (RESTORIL) capsule 30 mg  30 mg Oral QHS Johnn Hai, MD      . ziprasidone (GEODON) injection 20 mg  20 mg Intramuscular Q6H PRN Johnn Hai, MD        Lab Results:  Results for orders placed or performed during the hospital encounter of 05/01/19 (from the past 48 hour(s))  Rapid urine drug screen (hospital performed)     Status: Abnormal   Collection Time: 05/02/19  4:00 PM  Result Value Ref Range   Opiates NONE DETECTED NONE DETECTED  Cocaine POSITIVE (A) NONE DETECTED   Benzodiazepines NONE DETECTED NONE DETECTED   Amphetamines POSITIVE (A) NONE DETECTED   Tetrahydrocannabinol NONE DETECTED NONE DETECTED   Barbiturates NONE DETECTED NONE DETECTED    Comment: (NOTE) DRUG SCREEN FOR MEDICAL PURPOSES ONLY.  IF CONFIRMATION IS NEEDED FOR ANY PURPOSE, NOTIFY LAB WITHIN 5 DAYS. LOWEST DETECTABLE LIMITS FOR URINE DRUG SCREEN Drug Class                     Cutoff (ng/mL) Amphetamine and metabolites    1000 Barbiturate and metabolites    200 Benzodiazepine                 200 Tricyclics and metabolites     300 Opiates and metabolites        300 Cocaine and metabolites        300 THC                            50 Performed at [facility], [address], [place], [place] [address]   Urinalysis, Routine w reflex microscopic     Status: None   Collection Time: 05/02/19  4:00 PM  Result Value Ref Range   Color, Urine YELLOW YELLOW   APPearance CLEAR CLEAR   Specific Gravity, Urine 1.029 1.005 - 1.030   pH 5.0 5.0 - 8.0   Glucose, UA NEGATIVE NEGATIVE mg/dL   Hgb urine dipstick NEGATIVE NEGATIVE   Bilirubin Urine NEGATIVE NEGATIVE   Ketones, ur NEGATIVE NEGATIVE mg/dL   Protein, ur NEGATIVE NEGATIVE mg/dL   Nitrite NEGATIVE  NEGATIVE   Leukocytes,Ua NEGATIVE NEGATIVE    Comment: Performed at [facility], [address], [place], [place] [address]    Blood Alcohol level:  Lab Results  Component Value Date   [facility] <10 05/01/2019   ETH <10 09/25/2018    Metabolic Disorder Labs: Lab Results  Component Value Date   HGBA1C 5.6 10/12/2015   MPG 114 10/12/2015   No results found for: PROLACTIN Lab Results  Component Value Date   CHOL 195 03/11/2019   TRIG 32 03/11/2019   HDL 84 03/11/2019   CHOLHDL 2.3 03/11/2019   VLDL 12 12/28/2016   LDLCALC 104 (H) 03/11/2019   LDLCALC 97 12/28/2016    Physical Findings: AIMS:  , ,  ,  ,    CIWA:    COWS:     Musculoskeletal: Strength & Muscle Tone: within normal limits Gait & Station: normal Patient leans: N/A  Psychiatric Specialty Exam: Physical Exam  Nursing note and vitals reviewed. Constitutional: He is oriented to person, place, and time. He appears well-developed and well-nourished.  Cardiovascular: Normal rate.  Respiratory: Effort normal.  Neurological: He is alert and oriented to person, place, and time.    Review of Systems  Constitutional: Negative.   Respiratory: Negative for cough and shortness of breath.   Psychiatric/Behavioral: Positive for dysphoric mood. Negative for agitation, behavioral problems, hallucinations, self-injury, sleep disturbance and suicidal ideas. The patient is not nervous/anxious and is not hyperactive.     Blood pressure 135/72, pulse 73, temperature (!) 97.4 F (36.3 C), temperature source Oral, resp. rate 18, height 6\' 7"  (2.007 m), SpO2 97 %.Body mass index is 25.91 kg/m.  General Appearance: Casual  Eye Contact:  Minimal  Speech:  Slow  Volume:  Decreased  Mood:  Irritable  Affect:  Congruent  Thought Process:  Descriptions of Associations:  Tangential  Orientation:  Full (Time, Place, and Person)  Thought Content:  Poverty of content  Suicidal Thoughts:  No  Homicidal Thoughts:  No  Memory:   Immediate;   Fair Recent;   Fair  Judgement:  Poor  Insight:  Lacking  Psychomotor Activity:  Decreased  Concentration:  Concentration: Fair and Attention Span: Fair  Recall:  [employer] of Knowledge:  Fair  Language:  Fair  Akathisia:  No  Handed:  Right  AIMS (if indicated):     Assets:  Communication Skills Resilience  ADL's:  Intact  Cognition:  WNL  Sleep:        Treatment Plan Summary: Daily contact with patient to assess and evaluate symptoms and progress in treatment and Medication management   Continue inpatient hospitalization. Continue to encourage medication compliance.  Continue Zyprexa 15 mg PO BID for psychosis Continue gabapentin 300 mg PO TID for anxiety Continue Vistaril 50 mg PO Q4HR PRN anxiety Continue Restoril 30 mg PO QHS for insomnia Continue Geodon 20 mg IM Q6HR PRN agitation  Patient will participate in the therapeutic group milieu.  Discharge disposition in progress.   Aldean Baker, NP 05/04/2019, 10:40 AM

## 2019-05-04 NOTE — Progress Notes (Signed)
Pt denies SI/HI. Pt denies AVH. Pt stated he doesn't need to be here in the hospital because there is nothing wrong with him mentally. He reported going to the hospital to be assessed for internal bleeding, but instead, he was sent here. He is refusing to take medications due to feeling weak and tired from previous IM injections. No behavioral issues noted.  V/s reviewed. Orders reviewed. Verbal support provided. Pt encouraged to attend groups. 15 minute checks performed for safety.  Pt resistant to tx.

## 2019-05-05 NOTE — Progress Notes (Signed)
Pt refuses to take Zyprexa. Encouragement and education provided.     05/05/19 0900  Psych Admission Type (Psych Patients Only)  Admission Status Involuntary  Psychosocial Assessment  Patient Complaints Suspiciousness;Worrying  Eye Contact Avoids  Facial Expression Flat  Affect Inconsistent with thought content;Irritable;Preoccupied  Speech Tangential;Pressured;Rapid  Interaction Minimal  Motor Activity Restless  Appearance/Hygiene In scrubs;Poor hygiene  Behavior Characteristics Irritable;Agitated;Unwilling to participate  Mood Preoccupied  Thought Process  Coherency Circumstantial;Disorganized;Tangential;Loose associations  Content Blaming others;Paranoia;Preoccupation  Delusions Paranoid;Somatic  Perception Derealization  Hallucination None reported or observed  Judgment Limited  Confusion Mild  Danger to Self  Current suicidal ideation? Denies  Danger to Others  Danger to Others None reported or observed

## 2019-05-05 NOTE — Progress Notes (Signed)
[facility] MD Progress Note  05/05/2019 10:51 AM Brett Salinas  MRN:  [id number] Subjective:  "What do you need?"  [name] found lying in bed. He remains irritable and guarded. He remains tangential and pressured. He has been refusing medications and refuses to discuss, stating "I just need to get out of here. I'm being held against my will, and that's called kidnapping." He continues to isolate to his room. He repeatedly requests discharge and refuses to answer further questions. No agitated or disruptive behaviors. He denies SI/HI/AVH.  From admission H&P: This is the latest of multiple admissions and healthcare system encounters for [name]-he is a 49 year old patient with a known schizoaffective type condition, complicated by cardiac disease, polysubstance abuse, and chronic noncompliance. He has had multiple hospital and emergency room presentations related to psychosis/substance-induced psychosis. Patient spouse spoke to ER nurses on 4/1informing that the patient lives on [address] [facility] [address], he came to [place] but "lost his truck" and phoned her with delusional believes that he was staying in a "contaminated room"  Principal Problem: <principal problem not specified> Diagnosis: Active Problems:   Schizoaffective disorder, bipolar type (HCC)  Total Time spent with patient: 15 minutes  Past Psychiatric History: See admission H&P  Past Medical History:  Past Medical History:  Diagnosis Date  . Achilles tendon rupture-surgery 09/19/15 09/19/2015  . Altered mental status 12/23/2016  . Arthritis    "hips, knees, back" (10/13/2015)  . Bacterial conjunctivitis of right eye 2018  . Bipolar disorder (HCC)   . CAD -S/P PCI LAD/DES 10/13/15 10/14/2015  . CHF (congestive heart failure) (HCC)   . Cocaine abuse-drug sceen positive, History of 10/14/2015  . Coronary artery disease   . Depression   . DVT (deep venous thrombosis) (HCC) 09/2015   right  . Essential hypertension 11/30/2015  .  Heart murmur   . High cholesterol   . Hypertension   . Myocardial infarction (HCC) 10/12/2015 X 2-3  . NSTEMI (non-ST elevated myocardial infarction) (HCC)   . Osteoarthritis   . Rectal myiasis   . Schizoaffective disorder, bipolar type (HCC) 10/14/2015  . Schizoaffective disorder, bipolar type (HCC)   . Sciatica     Past Surgical History:  Procedure Laterality Date  . CARDIAC CATHETERIZATION N/A 10/13/2015   Procedure: Left Heart Cath and Coronary Angiography;  Surgeon: Corky Crafts, MD;  Location: [facility] INVASIVE CV LAB;  Service: Cardiovascular;  Laterality: N/A;  . CARDIAC CATHETERIZATION N/A 10/13/2015   Procedure: Coronary Stent Intervention;  Surgeon: Corky Crafts, MD;  Location: [facility] INVASIVE CV LAB;  Service: Cardiovascular;  Laterality: N/A;  . CARDIAC CATHETERIZATION N/A 10/13/2015   Procedure: Intravascular Ultrasound/IVUS;  Surgeon: Corky Crafts, MD;  Location: [facility] INVASIVE CV LAB;  Service: Cardiovascular;  Laterality: N/A;  . CORONARY ANGIOPLASTY WITH STENT PLACEMENT  10/13/2015  . I & D EXTREMITY Right 09/19/2015   Procedure: IRRIGATION AND DEBRIDEMENT ANKLE LACERATIONS POSSIBLE TENDON REPAIR;  Surgeon: Cammy Copa, MD;  Location: [facility] OR;  Service: Orthopedics;  Laterality: Right;  . I & D EXTREMITY Right 10/14/2015   Procedure: IRRIGATION AND DEBRIDEMENT EXTREMITY/Right Ankle;  Surgeon: Cammy Copa, MD;  Location: [facility] OR;  Service: Orthopedics;  Laterality: Right;   Family History: History reviewed. No pertinent family history. Family Psychiatric  History: See admission H&P Social History:  Social History   Substance and Sexual Activity  Alcohol Use Yes     Social History   Substance and Sexual Activity  Drug Use Yes  . Types: Cocaine,  Marijuana   Comment: 10/13/2015 "I've tried alot; I don't have any habits"    Social History   Socioeconomic History  . Marital status: Single    Spouse name: Not on file  . Number of children: Not on file   . Years of education: Not on file  . Highest education level: Not on file  Occupational History  . Not on file  Tobacco Use  . Smoking status: Never Smoker  . Smokeless tobacco: Never Used  Substance and Sexual Activity  . Alcohol use: Yes  . Drug use: Yes    Types: Cocaine, Marijuana    Comment: 10/13/2015 "I've tried alot; I don't have any habits"  . Sexual activity: Not on file  Other Topics Concern  . Not on file  Social History Narrative   ** Merged History Encounter **       Social Determinants of Health   Financial Resource Strain:   . Difficulty of Paying Living Expenses:   Food Insecurity:   . Worried About Programme researcher, broadcasting/film/video in the Last Year:   . Barista in the Last Year:   Transportation Needs:   . Freight forwarder (Medical):   Marland Kitchen Lack of Transportation (Non-Medical):   Physical Activity:   . Days of Exercise per Week:   . Minutes of Exercise per Session:   Stress:   . Feeling of Stress :   Social Connections:   . Frequency of Communication with Friends and Family:   . Frequency of Social Gatherings with Friends and Family:   . Attends Religious Services:   . Active Member of Clubs or Organizations:   . Attends Banker Meetings:   Marland Kitchen Marital Status:    Additional Social History:                         Sleep: Fair  Appetite:  Fair  Current Medications: Current Facility-Administered Medications  Medication Dose Route Frequency Provider Last Rate Last Admin  . acetaminophen (TYLENOL) tablet 650 mg  650 mg Oral Q6H PRN Malvin Johns, MD      . alum & mag hydroxide-simeth (MAALOX/MYLANTA) 200-200-20 MG/5ML suspension 30 mL  30 mL Oral Q4H PRN Malvin Johns, MD      . diphenhydrAMINE (BENADRYL) capsule 50 mg  50 mg Oral Q6H PRN Malvin Johns, MD      . gabapentin (NEURONTIN) capsule 300 mg  300 mg Oral TID Malvin Johns, MD   300 mg at 05/05/19 0801  . hydrOXYzine (ATARAX/VISTARIL) tablet 50 mg  50 mg Oral Q4H PRN Malvin Johns, MD      . magnesium hydroxide (MILK OF MAGNESIA) suspension 30 mL  30 mL Oral Daily PRN Malvin Johns, MD      . OLANZapine zydis (ZYPREXA) disintegrating tablet 15 mg  15 mg Oral BID Malvin Johns, MD      . temazepam (RESTORIL) capsule 30 mg  30 mg Oral QHS Malvin Johns, MD      . ziprasidone (GEODON) injection 20 mg  20 mg Intramuscular Q6H PRN Malvin Johns, MD        Lab Results: No results found for this or any previous visit (from the past 48 hour(s)).  Blood Alcohol level:  Lab Results  Component Value Date   [facility] <10 05/01/2019   ETH <10 09/25/2018    Metabolic Disorder Labs: Lab Results  Component Value Date   HGBA1C 5.6 10/12/2015   MPG 114  10/12/2015   No results found for: PROLACTIN Lab Results  Component Value Date   CHOL 195 03/11/2019   TRIG 32 03/11/2019   HDL 84 03/11/2019   CHOLHDL 2.3 03/11/2019   VLDL 12 12/28/2016   LDLCALC 104 (H) 03/11/2019   LDLCALC 97 12/28/2016    Physical Findings: AIMS:  , ,  ,  ,    CIWA:    COWS:     Musculoskeletal: Strength & Muscle Tone: within normal limits Gait & Station: normal Patient leans: N/A  Psychiatric Specialty Exam: Physical Exam  Nursing note and vitals reviewed. Constitutional: He is oriented to person, place, and time. He appears well-developed and well-nourished.  Cardiovascular: Normal rate.  Respiratory: Effort normal.  Neurological: He is alert and oriented to person, place, and time.    Review of Systems  Constitutional: Negative.   Respiratory: Negative for cough and shortness of breath.   Psychiatric/Behavioral: Positive for agitation and dysphoric mood. Negative for behavioral problems, confusion, hallucinations, self-injury, sleep disturbance and suicidal ideas. The patient is not nervous/anxious and is not hyperactive.     Blood pressure (!) 133/107, pulse 100, temperature 98.7 F (37.1 C), temperature source Oral, resp. rate 18, height 6\' 7"  (2.007 m), SpO2 97 %.Body mass index is  25.91 kg/m.  General Appearance: Disheveled  Eye Contact:  Minimal  Speech:  Slow  Volume:  Increased  Mood:  Irritable  Affect:  Congruent  Thought Process:  Coherent  Orientation:  Full (Time, Place, and Person)  Thought Content:  Tangential  Suicidal Thoughts:  No  Homicidal Thoughts:  No  Memory:  Immediate;   Fair Recent;   Fair  Judgement:  Fair  Insight:  Lacking  Psychomotor Activity:  Normal  Concentration:  Concentration: Fair and Attention Span: Fair  Recall:  [employer] of Knowledge:  Fair  Language:  Fair  Akathisia:  No  Handed:  Right  AIMS (if indicated):     Assets:  Communication Skills Housing Social Support  ADL's:  Intact  Cognition:  WNL  Sleep:  Number of Hours: 3     Treatment Plan Summary: Daily contact with patient to assess and evaluate symptoms and progress in treatment and Medication management   Continue inpatient hospitalization. Continue to encourage medication compliance.  Continue Zyprexa 15 mg PO BID for psychosis Continue gabapentin 300 mg PO TID for anxiety Continue Vistaril 50 mg PO Q4HR PRN anxiety Continue Restoril 30 mg PO QHS for insomnia Continue Geodon 20 mg IM Q6HR PRN agitation  Patient will participate in the therapeutic group milieu.  Discharge disposition in progress.   Connye Burkitt, NP 05/05/2019, 10:51 AM

## 2019-05-05 NOTE — [facility] Suicide Risk Assessment (Signed)
[facility] INPATIENT:  Family/Significant Other Suicide Prevention Education  Suicide Prevention Education:  Education Completed; significant other Gladis Riffle [phone],  (name of family member/significant other) has been identified by the patient as the family member/significant other with whom the patient will be residing, and identified as the person(s) who will aid the patient in the event of a mental health crisis (suicidal ideations/suicide attempt).  With written consent from the patient, the family member/significant other has been provided the following suicide prevention education, prior to the and/or following the discharge of the patient.  Significant other of 15 months reports that patient always thinks people are trying to poison him and cannot tolerate people being behind him.  He called her this morning and he reported to her that he has not seen a psychiatrist since arriving at the hospital.  She did some calling around and found that he has seen the doctor.  He will go days without sleep or with only 3-4 hours, feeling that he does not need sleep.  She knows that the patient uses some substances but does not know what although she does know he does not inject anything.  She only suspects drug use because he will become very sweaty.  He drinks a small amount of beer.  The patient has a history of 5 heart attacks about 5 years ago and has a cardiologist in the [place].  He takes his heart healthcare seriously.  He has a lot of problems with his feet as well and has special padded socks that he needs to wear.  Patient was incarcerated February-May 2020 after missing mental health appointments and "something else, but can't remember."  He is not currently in any mental health treatment and as his significant other talking to him on the phone, she states he acts like his normal self and she is okay with him declining mental health services at discharge.  Significant other and patient  first met when he was homeless and she took him in.  [place]  They then fell in love.  She is 49yo and states she takes care of him almost like a child but also romantically.  She gets him to eat well and drink fluids.  She stated she is very frugal and patient likes to spend money.  She misses him greatly and asks for him to be discharged ASAP.  "If he is stubborn that is his nature."  Their truck is their only transportation, and it has been lost here in [place].  Patient's best friend is going to pick him up from the hospital and take him to his truck which they believe is parked on [address].   Father lives in [place] and they are close.   Patient was not suicidal at admission.  The suicide prevention education provided includes the following:  Suicide risk factors  Suicide prevention and interventions  National Suicide Hotline telephone number  [facility] assessment telephone number  [facility] Emergency Assistance [place] and/or Residential Mobile Crisis Unit telephone number  Request made of family/significant other to:  Remove weapons (e.g., guns, rifles, knives), all items previously/currently identified as safety concern.    Remove drugs/medications (over-the-counter, prescriptions, illicit drugs), all items previously/currently identified as a safety concern.  The family member/significant other verbalizes understanding of the suicide prevention education information provided.  The family member/significant other agrees to remove the items of safety concern listed above.  Hortense Ramal  J Grossman-Orr 05/05/2019, 1:05 PM

## 2019-05-06 MED ORDER — OLANZAPINE 20 MG PO TBDP: 20.0000 mg | ORAL_TABLET | Freq: Every day | ORAL | 2 refills | Status: DC

## 2019-05-06 MED ORDER — ARIPIPRAZOLE ER 400 MG IM SRER: 400.0000 mg | INTRAMUSCULAR | 11 refills | Status: AC

## 2019-05-06 MED ORDER — GABAPENTIN 300 MG PO CAPS: 300.0000 mg | ORAL_CAPSULE | Freq: Three times a day (TID) | ORAL | 2 refills | Status: DC

## 2019-05-06 MED ORDER — OLANZAPINE 10 MG PO TBDP: 20.0000 mg | ORAL_TABLET | Freq: Every day | ORAL | Status: DC

## 2019-05-06 MED ORDER — OLANZAPINE 5 MG PO TBDP
5.0000 mg | ORAL_TABLET | Freq: Two times a day (BID) | ORAL | Status: DC
  Filled 2019-05-06 (×5): qty 1

## 2019-05-06 MED ORDER — ARIPIPRAZOLE ER 400 MG IM SRER
400.0000 mg | INTRAMUSCULAR | Status: DC
  Administered 2019-05-06: 400 mg via INTRAMUSCULAR

## 2019-05-06 NOTE — Progress Notes (Signed)
Recreation Therapy Notes  Date: 4.5.21 Time: 1000 Location: 500 Hall Dayroom  Group Topic: Coping Skills  Goal Area(s) Addresses:  Patient will identify positive coping strategies. Patient will identify benefit of using coping strategies post d/c.  Behavioral Response: Minimal  Intervention: Worksheet  Activity: Healthy vs. Unhealthy Coping Strategies.  Patients were to identify a current problem they are currently dealing with.  Patients also had to identify unhealthy coping strategies and consequences of these coping strategies.  Lastly, patients identified healthy coping strategies, expected outcomes and barriers to using these coping strategies.  Education: Pharmacologist, Building control surveyor.   Education Outcome: Acknowledges understanding/In group clarification offered/Needs additional education.   Clinical Observations/Feedback: Patient participated in going over the scenarios presented in group.  However, pt did not complete worksheet.    Caroll Rancher, LRT/CTRS         Caroll Rancher A 05/06/2019 11:51 AM

## 2019-05-06 NOTE — [facility] Counselor (Signed)
Patient continues to decline all outpatient follow up and referrals. He states he does not like taking medications.  Enid Cutter, MSW, LCSW-A Clinical Social Worker [facility]

## 2019-05-06 NOTE — Discharge Summary (Signed)
Physician Discharge Summary Note  Patient:  Brett Salinas is an 49 y.o., male MRN:  [id number] DOB:  [birth year] Patient phone:  [phone] (home)  Patient address:   [address] [place] [address],  Total Time spent with patient: 15 minutes  Date of Admission:  05/03/2019 Date of Discharge: 05/06/19  Reason for Admission:  psychosis  Principal Problem: <principal problem not specified> Discharge Diagnoses: Active Problems:   Schizoaffective disorder, bipolar type [facility])   Past Psychiatric History: Numerous similar presentations again has a chronic psychotic disorder complicated by substance abuse and medical comorbidities.  For example, From admission note of August/4739 49 year old male with PMH of schizoaffective/bipolar disorder,NSTEMI,CAD status post PCI, chronic diastolic CHF, HTN, HLD, polysubstance abuse including cocaineand medication noncompliance, [facility] was called to patient's hotel room due to agitation and aggression after reportedly consuming alcohol, cocaine, Percocet, heroin and ecstasy, was given Versed and Haldol onsite and admitted for acute toxic metabolic encephalopathy, acute renal failure, rhabdomyolysis, elevated troponin. Some concern regarding suicidal ideations and hence was IVC by EDP.Psychiatry consulted Admission note of 12/2016 patient was brought to [facility] ER utterly confused, apparently walking around for two days. He was positive for amphetamines and initially was thought to be in withdrawals. He was briefly admitted to medicine for altered mental status.He does have a history of schizophrenia and has not been compliant with medications of Zyprexa andTegretol astheymake his sleepy. In the ER, he was clearly psychotic, paranoid, delusional, hallucinating and agitated. He felt the government is after him to eliminate people with mental illness. He was unable to answer many questions. He has a history of substance use including  cocaine.  Past Medical History:  Past Medical History:  Diagnosis Date  . Achilles tendon rupture-surgery 09/19/15 09/19/2015  . Altered mental status 12/23/2016  . Arthritis    "hips, knees, back" (10/13/2015)  . Bacterial conjunctivitis of right eye 2018  . Bipolar disorder ([place])   . CAD -S/P PCI LAD/DES 10/13/15 10/14/2015  . CHF (congestive heart failure) ([place])   . Cocaine abuse-drug sceen positive, History of 10/14/2015  . Coronary artery disease   . Depression   . DVT (deep venous thrombosis) ([place]) 09/2015   right  . Essential hypertension 11/30/2015  . Heart murmur   . High cholesterol   . Hypertension   . Myocardial infarction ([place]) 10/12/2015 X 2-3  . NSTEMI (non-ST elevated myocardial infarction) ([place])   . Osteoarthritis   . Rectal myiasis   . Schizoaffective disorder, bipolar type ([place]) 10/14/2015  . Schizoaffective disorder, bipolar type ([place])   . Sciatica     Past Surgical History:  Procedure Laterality Date  . CARDIAC CATHETERIZATION N/A 10/13/2015   Procedure: Left Heart Cath and Coronary Angiography;  Surgeon: Jettie Booze, MD;  Location: [place] CV LAB;  Service: Cardiovascular;  Laterality: N/A;  . CARDIAC CATHETERIZATION N/A 10/13/2015   Procedure: Coronary Stent Intervention;  Surgeon: Jettie Booze, MD;  Location: [place] CV LAB;  Service: Cardiovascular;  Laterality: N/A;  . CARDIAC CATHETERIZATION N/A 10/13/2015   Procedure: Intravascular Ultrasound/IVUS;  Surgeon: Jettie Booze, MD;  Location: [place] CV LAB;  Service: Cardiovascular;  Laterality: N/A;  . CORONARY ANGIOPLASTY WITH STENT PLACEMENT  10/13/2015  . I & D EXTREMITY Right 09/19/2015   Procedure: IRRIGATION AND DEBRIDEMENT ANKLE LACERATIONS POSSIBLE TENDON REPAIR;  Surgeon: Meredith Pel, MD;  Location: [place];  Service: Orthopedics;  Laterality: Right;  . I & D EXTREMITY Right 10/14/2015   Procedure:  IRRIGATION AND DEBRIDEMENT EXTREMITY/Right Ankle;  Surgeon: Meredith Pel, MD;  Location: [place];  Service: Orthopedics;  Laterality: Right;   Family History: History reviewed. No pertinent family history. Family Psychiatric  History: Denies Social History:  Social History   Substance and Sexual Activity  Alcohol Use Yes     Social History   Substance and Sexual Activity  Drug Use Yes  . Types: Cocaine, Marijuana   Comment: 10/13/2015 "I've tried alot; I don't have any habits"    Social History   Socioeconomic History  . Marital status: Single    Spouse name: Not on file  . Number of children: Not on file  . Years of education: Not on file  . Highest education level: Not on file  Occupational History  . Not on file  Tobacco Use  . Smoking status: Never Smoker  . Smokeless tobacco: Never Used  Substance and Sexual Activity  . Alcohol use: Yes  . Drug use: Yes    Types: Cocaine, Marijuana    Comment: 10/13/2015 "I've tried alot; I don't have any habits"  . Sexual activity: Not on file  Other Topics Concern  . Not on file  Social History Narrative   ** Merged History Encounter **       Social Determinants of Health   Financial Resource Strain:   . Difficulty of Paying Living Expenses:   Food Insecurity:   . Worried About Charity fundraiser in the Last Year:   . Arboriculturist in the Last Year:   Transportation Needs:   . Film/video editor (Medical):   Marland Kitchen Lack of Transportation (Non-Medical):   Physical Activity:   . Days of Exercise per Week:   . Minutes of Exercise per Session:   Stress:   . Feeling of Stress :   Social Connections:   . Frequency of Communication with Friends and Family:   . Frequency of Social Gatherings with Friends and Family:   . Attends Religious Services:   . Active Member of Clubs or Organizations:   . Attends Archivist Meetings:   Marland Kitchen Marital Status:     Hospital Course:  From admission H&P: This is the latest of multiple admissions and healthcare system encounters for Mr.  Mcquinn-he is a 49 year old patient with a known schizoaffective type condition, complicated by cardiac disease, polysubstance abuse, and chronic noncompliance.  He has had multiple hospital and emergency room presentations related to psychosis/substance-induced psychosis, and he presented on 3/31 after he had been released from a recent incarceration, he was delusional, had a very high CK, and was treated in the emergency department for medical stabilization. 4/1 he was refusing aspects of care, pulled out his IV, required chemical restraint.  He was felt to be psychotic secondary to substance use in combination with lack of treatment for his psychotic disorder, was described as "hallucinating not redirectable hostile toward staff by the morning hours of 4/1. By the evening he was on lorazepam as needed and Zyprexa Zydis. Patient spouse spoke to ER nurses on 4/1 informing that the patient lives on [facility] [address] [facility], he came to [place] but "lost his truck" and phoned her with delusional believes that he was staying in a "contaminated room" she elaborated he had been off of his medications, refusing to take them and believes he throws them away. The patient himself cannot state why he came to [place], he rambles, he states "they are giving me drugs I do not know his  Thorazine," then he rambles about the police and its rather incoherent states "what are you clicking" and I ask him if he is hearing things, the interview is very difficult because sometimes he simply refuses to answer questions.  He is irritable he refused to have his belongings searched and so forth but is generally redirectable. UDS positive for cocaine - methamphetamines - 05/02/2019. Follow-up w wife for collateral- she elaborates that he was homeless when he moved in w/ her- met at church- she is a Equities trader citizen-she confirms that he throws away his medication and abuses drugs.  Mr. Tabb presented to [facility] with complaints of weakness and  contaminated feet. CK was noted to be elevated at that time. He was medically stabilized and transferred to [facility] for acute psychosis. Admission UDS positive for amphetamines and cocaine. He remained on the [facility] unit for three days. He was started on Zyprexa and Neurontin. He was intermittently noncompliant with medications during hospitalization and expressed poor insight into his condition. He remained focused on discharge throughout hospitalization. Collateral information was obtained from his significant other, who feels that he is at his baseline mental status and requested that he be discharged. He isolated to his room during hospitalization. He showed no agitated or disruptive behaviors on the unit. He has shown stable mood, affect, sleep, and interaction. He denies any SI/HI/AVH and contracts for safety. He shows no signs of responding to internal stimuli. He is discharging on the medications listed below. He is provided with prescriptions for medications upon discharge. He declines referrals for follow-up care. His friend is picking him up for discharge home.  Physical Findings: AIMS:  , ,  ,  ,    CIWA:    COWS:     Musculoskeletal: Strength & Muscle Tone: within normal limits Gait & Station: normal Patient leans: N/A  Psychiatric Specialty Exam: Physical Exam  Nursing note and vitals reviewed. Constitutional: He is oriented to person, place, and time. He appears well-developed and well-nourished.  Cardiovascular: Normal rate.  Respiratory: Effort normal.  Neurological: He is alert and oriented to person, place, and time.    Review of Systems  Constitutional: Negative.   Respiratory: Negative for cough and shortness of breath.   Psychiatric/Behavioral: Negative for agitation, behavioral problems, confusion, dysphoric mood, hallucinations, self-injury, sleep disturbance and suicidal ideas. The patient is not nervous/anxious and is not hyperactive.     Blood pressure (!) 133/107, pulse  100, temperature 98.7 F (37.1 C), temperature source Oral, resp. rate 18, height 6' 7" (2.007 m), SpO2 97 %.Body mass index is 25.91 kg/m.  See MD's discharge SRA      Has this patient used any form of tobacco in the last 30 days? (Cigarettes, Smokeless Tobacco, Cigars, and/or Pipes)  No  Blood Alcohol level:  Lab Results  Component Value Date   ETH <10 05/01/2019   ETH <10 27/04/5007    Metabolic Disorder Labs:  Lab Results  Component Value Date   HGBA1C 5.6 10/12/2015   MPG 114 10/12/2015   No results found for: PROLACTIN Lab Results  Component Value Date   CHOL 195 03/11/2019   TRIG 32 03/11/2019   HDL 84 03/11/2019   CHOLHDL 2.3 03/11/2019   VLDL 12 12/28/2016   LDLCALC 104 (H) 03/11/2019   LDLCALC 97 12/28/2016    See Psychiatric Specialty Exam and Suicide Risk Assessment completed by Attending Physician prior to discharge.  Discharge destination:  Home  Is patient on multiple antipsychotic therapies at discharge:  No  Has Patient had three or more failed trials of antipsychotic monotherapy by history:  No  Recommended Plan for Multiple Antipsychotic Therapies: NA   Allergies as of 05/06/2019      Reactions   Catfish [fish Allergy] Anaphylaxis   Heparin Itching   Full-body itching inc throat   Pork-derived Products Itching   Burning/itching      Medication List    STOP taking these medications   cyclobenzaprine 10 MG tablet Commonly known as: FLEXERIL     TAKE these medications     Indication  ARIPiprazole ER 400 MG Srer injection Commonly known as: ABILIFY MAINTENA Inject 2 mLs (400 mg total) into the muscle every 28 (twenty-eight) days. Due 5/1  Indication: MIXED BIPOLAR AFFECTIVE DISORDER   aspirin EC 81 MG tablet Take 1 tablet (81 mg total) by mouth daily.  Indication: Disease involving Lipid Deposits in the Arteries   atorvastatin 40 MG tablet Commonly known as: LIPITOR Take 1 tablet (40 mg total) by mouth daily.  Indication: High  Amount of Fats in the Blood   diclofenac Sodium 1 % Gel Commonly known as: VOLTAREN Apply 4 g topically 4 (four) times daily.  Indication: Joint Damage causing Pain and Loss of Function   gabapentin 300 MG capsule Commonly known as: NEURONTIN Take 1 capsule (300 mg total) by mouth 3 (three) times daily.  Indication: Social Anxiety Disorder   losartan 25 MG tablet Commonly known as: COZAAR Take 1 tablet (25 mg total) by mouth daily.  Indication: High Blood Pressure Disorder   OLANZapine zydis 20 MG disintegrating tablet Commonly known as: ZYPREXA Take 1 tablet (20 mg total) by mouth at bedtime.  Indication: Hypomanic Episode of Bipolar Disorder      Follow-up Information    Patient declines all follow-up referrals. Follow up.           Follow-up recommendations: Activity as tolerated. Diet as recommended by primary care physician. Keep all scheduled follow-up appointments as recommended.   Comments:   Patient is instructed to take all prescribed medications as recommended. Report any side effects or adverse reactions to your outpatient psychiatrist. Patient is instructed to abstain from alcohol and illegal drugs while on prescription medications. In the event of worsening symptoms, patient is instructed to call the crisis hotline, 911, or go to the nearest emergency department for evaluation and treatment.  Signed: Connye Burkitt, NP 05/06/2019, 10:36 AM

## 2019-05-06 NOTE — Tx Team (Signed)
Interdisciplinary Treatment and Diagnostic Plan Update  05/06/2019 Time of Session: 9:00am Brett Salinas MRN: [id number]  Principal Diagnosis: <principal problem not specified>  Secondary Diagnoses: Active Problems:   Schizoaffective disorder, bipolar type ([place])   Current Medications:  Current Facility-Administered Medications  Medication Dose Route Frequency Provider Last Rate Last Admin  . acetaminophen (TYLENOL) tablet 650 mg  650 mg Oral Q6H PRN Johnn Hai, MD      . alum & mag hydroxide-simeth (MAALOX/MYLANTA) 200-200-20 MG/5ML suspension 30 mL  30 mL Oral Q4H PRN Johnn Hai, MD      . ARIPiprazole ER (ABILIFY MAINTENA) injection 400 mg  400 mg Intramuscular Q28 days Johnn Hai, MD   400 mg at 05/06/19 0906  . diphenhydrAMINE (BENADRYL) capsule 50 mg  50 mg Oral Q6H PRN Johnn Hai, MD      . gabapentin (NEURONTIN) capsule 300 mg  300 mg Oral TID Johnn Hai, MD   300 mg at 05/06/19 1638  . hydrOXYzine (ATARAX/VISTARIL) tablet 50 mg  50 mg Oral Q4H PRN Johnn Hai, MD      . magnesium hydroxide (MILK OF MAGNESIA) suspension 30 mL  30 mL Oral Daily PRN Johnn Hai, MD      . OLANZapine zydis (ZYPREXA) disintegrating tablet 5 mg  5 mg Oral BID Johnn Hai, MD      . temazepam (RESTORIL) capsule 30 mg  30 mg Oral QHS Johnn Hai, MD   30 mg at 05/05/19 2051  . ziprasidone (GEODON) injection 20 mg  20 mg Intramuscular Q6H PRN Johnn Hai, MD       PTA Medications: Medications Prior to Admission  Medication Sig Dispense Refill Last Dose  . aspirin EC 81 MG tablet Take 1 tablet (81 mg total) by mouth daily. (Patient not taking: Reported on 05/02/2019) 30 tablet 0   . atorvastatin (LIPITOR) 40 MG tablet Take 1 tablet (40 mg total) by mouth daily. (Patient not taking: Reported on 05/02/2019) 90 tablet 3   . cyclobenzaprine (FLEXERIL) 10 MG tablet Take 1 tablet (10 mg total) by mouth 2 (two) times daily as needed for muscle spasms. (Patient not taking: Reported on 05/02/2019)  20 tablet 0   . diclofenac Sodium (VOLTAREN) 1 % GEL Apply 4 g topically 4 (four) times daily. (Patient not taking: Reported on 05/02/2019) 100 g 0   . losartan (COZAAR) 25 MG tablet Take 1 tablet (25 mg total) by mouth daily. (Patient not taking: Reported on 05/02/2019) 90 tablet 3     Patient Stressors:    Patient Strengths:    Treatment Modalities: Medication Management, Group therapy, Case management,  1 to 1 session with clinician, Psychoeducation, Recreational therapy.   Physician Treatment Plan for Primary Diagnosis: <principal problem not specified> Long Term Goal(s): Improvement in symptoms so as ready for discharge Improvement in symptoms so as ready for discharge   Short Term Goals: Ability to identify changes in lifestyle to reduce recurrence of condition will improve Ability to verbalize feelings will improve Ability to disclose and discuss suicidal ideas Ability to demonstrate self-control will improve Ability to identify and develop effective coping behaviors will improve Ability to maintain clinical measurements within normal limits will improve Ability to identify changes in lifestyle to reduce recurrence of condition will improve Ability to verbalize feelings will improve Ability to disclose and discuss suicidal ideas Ability to demonstrate self-control will improve Ability to identify and develop effective coping behaviors will improve  Medication Management: Evaluate patient's response, side effects, and tolerance of medication regimen.  Therapeutic Interventions: 1 to 1 sessions, Unit Group sessions and Medication administration.  Evaluation of Outcomes: Adequate for Discharge  Physician Treatment Plan for Secondary Diagnosis: Active Problems:   Schizoaffective disorder, bipolar type ([facility])  Long Term Goal(s): Improvement in symptoms so as ready for discharge Improvement in symptoms so as ready for discharge   Short Term Goals: Ability to identify changes in  lifestyle to reduce recurrence of condition will improve Ability to verbalize feelings will improve Ability to disclose and discuss suicidal ideas Ability to demonstrate self-control will improve Ability to identify and develop effective coping behaviors will improve Ability to maintain clinical measurements within normal limits will improve Ability to identify changes in lifestyle to reduce recurrence of condition will improve Ability to verbalize feelings will improve Ability to disclose and discuss suicidal ideas Ability to demonstrate self-control will improve Ability to identify and develop effective coping behaviors will improve     Medication Management: Evaluate patient's response, side effects, and tolerance of medication regimen.  Therapeutic Interventions: 1 to 1 sessions, Unit Group sessions and Medication administration.  Evaluation of Outcomes: Adequate for Discharge   RN Treatment Plan for Primary Diagnosis: <principal problem not specified> Long Term Goal(s): Knowledge of disease and therapeutic regimen to maintain health will improve  Short Term Goals: Ability to demonstrate self-control, Ability to participate in decision making will improve and Compliance with prescribed medications will improve  Medication Management: RN will administer medications as ordered by provider, will assess and evaluate patient's response and provide education to patient for prescribed medication. RN will report any adverse and/or side effects to prescribing provider.  Therapeutic Interventions: 1 on 1 counseling sessions, Psychoeducation, Medication administration, Evaluate responses to treatment, Monitor vital signs and CBGs as ordered, Perform/monitor CIWA, COWS, AIMS and Fall Risk screenings as ordered, Perform wound care treatments as ordered.  Evaluation of Outcomes: Adequate for Discharge   LCSW Treatment Plan for Primary Diagnosis: <principal problem not specified> Long Term  Goal(s): Safe transition to appropriate next level of care at discharge, Engage patient in therapeutic group addressing interpersonal concerns.  Short Term Goals: Engage patient in aftercare planning with referrals and resources, Identify triggers associated with mental health/substance abuse issues and Increase skills for wellness and recovery  Therapeutic Interventions: Assess for all discharge needs, 1 to 1 time with Social worker, Explore available resources and support systems, Assess for adequacy in community support network, Educate family and significant other(s) on suicide prevention, Complete Psychosocial Assessment, Interpersonal group therapy.  Evaluation of Outcomes: Adequate for Discharge   Progress in Treatment: Attending groups: No. Participating in groups: No. Taking medication as prescribed: Yes. Toleration medication: Yes. Family/Significant other contact made: Yes, individual(s) contacted:  significant other, Inna. Patient understands diagnosis: Yes. Discussing patient identified problems/goals with staff: No. Medical problems stabilized or resolved: Yes. Denies suicidal/homicidal ideation: Yes. Issues/concerns per patient self-inventory: Yes.  New problem(s) identified: Yes, Describe:  endorses he will not maintain medication compliance.  New Short Term/Long Term Goal(s): detox, medication management for mood stabilization; elimination of SI thoughts; development of comprehensive mental wellness/sobriety plan.  Patient Goals:  Get discharged.  Discharge Plan or Barriers: Returning home, declined all outpatient follow up and referrals.  Reason for Continuation of Hospitalization: Anxiety Delusions  Medication stabilization  Estimated Length of Stay: discharging today  Attendees: Patient: 05/06/2019 10:14 AM  Physician: Dr.Farah 05/06/2019 10:14 AM  Nursing:  05/06/2019 10:14 AM  RN Care Manager: 05/06/2019 10:14 AM  Social Worker: Enid Cutter, LCSWA 05/06/2019  10:14 AM  Recreational  Therapist:  05/06/2019 10:14 AM  Other:  05/06/2019 10:14 AM  Other:  05/06/2019 10:14 AM  Other: 05/06/2019 10:14 AM    Scribe for Treatment Team: Darreld Mclean, LCSWA 05/06/2019 10:14 AM
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
## 2019-05-06 NOTE — Progress Notes (Signed)
  [facility] Adult Case Management Discharge Plan :  Will you be returning to the same living situation after discharge:  Yes,  home. At discharge, do you have transportation home?: Yes,  friend pick up to take him back to his truck. Do you have the ability to pay for your medications: No. Declines follow up.  Release of information consent forms completed and in the chart;  Patient's signature needed at discharge.  Patient to Follow up at: Follow-up Information    Patient declines all follow-up referrals. Follow up.           Next level of care provider has access to [facility] Link:no  Safety Planning and Suicide Prevention discussed: Yes,  with significant other, Inna.  Has patient been referred to the Quitline?: Patient refused referral  Patient has been referred for addiction treatment: Pt. refused referral  Darreld Mclean, LCSWA 05/06/2019, 9:43 AM

## 2019-05-06 NOTE — Progress Notes (Signed)
Pt discharged to lobby. Pt was stable and appreciative at that time. All papers, samples and prescriptions were given and valuables returned. Verbal understanding expressed. Denies SI/HI and A/VH. Pt given opportunity to express concerns and ask questions.  

## 2019-05-06 NOTE — Progress Notes (Signed)
   05/05/19 2200  COVID-19 Daily Checkoff  Have you had a fever (temp > 37.80C/100F)  in the past 24 hours?  No  If you have had runny nose, nasal congestion, sneezing in the past 24 hours, has it worsened? No  COVID-19 EXPOSURE  Have you traveled outside the state in the past 14 days? No  Have you been in contact with someone with a confirmed diagnosis of COVID-19 or PUI in the past 14 days without wearing appropriate PPE? No  Have you been living in the same home as a person with confirmed diagnosis of COVID-19 or a PUI (household contact)? No  Have you been diagnosed with COVID-19? No   
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
## 2019-06-17 ENCOUNTER — Other Ambulatory Visit: Payer: Self-pay

## 2019-06-17 ENCOUNTER — Emergency Department (HOSPITAL_COMMUNITY)
Admission: EM | Admit: 2019-06-17 | Discharge: 2019-06-19 | Payer: Self-pay | Attending: Emergency Medicine | Admitting: Emergency Medicine

## 2019-06-17 ENCOUNTER — Emergency Department (HOSPITAL_COMMUNITY): Payer: Self-pay

## 2019-06-17 DIAGNOSIS — I509 Heart failure, unspecified: Secondary | ICD-10-CM | POA: Insufficient documentation

## 2019-06-17 DIAGNOSIS — F1210 Cannabis abuse, uncomplicated: Secondary | ICD-10-CM | POA: Insufficient documentation

## 2019-06-17 DIAGNOSIS — I252 Old myocardial infarction: Secondary | ICD-10-CM | POA: Insufficient documentation

## 2019-06-17 DIAGNOSIS — Z20822 Contact with and (suspected) exposure to covid-19: Secondary | ICD-10-CM | POA: Insufficient documentation

## 2019-06-17 DIAGNOSIS — Z955 Presence of coronary angioplasty implant and graft: Secondary | ICD-10-CM | POA: Insufficient documentation

## 2019-06-17 DIAGNOSIS — F332 Major depressive disorder, recurrent severe without psychotic features: Secondary | ICD-10-CM | POA: Insufficient documentation

## 2019-06-17 DIAGNOSIS — R4182 Altered mental status, unspecified: Secondary | ICD-10-CM | POA: Insufficient documentation

## 2019-06-17 DIAGNOSIS — Z79899 Other long term (current) drug therapy: Secondary | ICD-10-CM | POA: Insufficient documentation

## 2019-06-17 DIAGNOSIS — I2510 Atherosclerotic heart disease of native coronary artery without angina pectoris: Secondary | ICD-10-CM | POA: Insufficient documentation

## 2019-06-17 DIAGNOSIS — I110 Hypertensive heart disease with heart failure: Secondary | ICD-10-CM | POA: Insufficient documentation

## 2019-06-17 DIAGNOSIS — F1410 Cocaine abuse, uncomplicated: Secondary | ICD-10-CM | POA: Insufficient documentation

## 2019-06-17 DIAGNOSIS — F23 Brief psychotic disorder: Secondary | ICD-10-CM | POA: Insufficient documentation

## 2019-06-17 LAB — ETHANOL: Alcohol, Ethyl (B): <10 mg/dL (ref <10)

## 2019-06-17 LAB — CBC WITH DIFFERENTIAL/PLATELET
Abs Immature Granulocytes: 0.03 10*3/uL (ref 0.00–0.07)
Basophils Absolute: 0 10*3/uL (ref 0.0–0.1)
Basophils Relative: 1 %
Eosinophils Absolute: 0 10*3/uL (ref 0.0–0.5)
Eosinophils Relative: 0 %
HCT: 40 % (ref 39.0–52.0)
Hemoglobin: 13 g/dL (ref 13.0–17.0)
Immature Granulocytes: 1 %
Lymphocytes Relative: 17 %
Lymphs Abs: 1 10*3/uL (ref 0.7–4.0)
MCH: 29.5 pg (ref 26.0–34.0)
MCHC: 32.5 g/dL (ref 30.0–36.0)
MCV: 90.9 fL (ref 80.0–100.0)
Monocytes Absolute: 0.2 10*3/uL (ref 0.1–1.0)
Monocytes Relative: 4 %
Neutro Abs: 4.5 10*3/uL (ref 1.7–7.7)
Neutrophils Relative %: 77 %
Platelets: 237 10*3/uL (ref 150–400)
RBC: 4.4 MIL/uL (ref 4.22–5.81)
RDW: 12.8 % (ref 11.5–15.5)
WBC: 5.8 10*3/uL (ref 4.0–10.5)
nRBC: 0 % (ref 0.0–0.2)

## 2019-06-17 LAB — COMPREHENSIVE METABOLIC PANEL
ALT: 13 U/L (ref 0–44)
AST: 23 U/L (ref 15–41)
Albumin: 3.9 g/dL (ref 3.5–5.0)
Alkaline Phosphatase: 57 U/L (ref 38–126)
Anion gap: 8 (ref 5–15)
BUN: 12 mg/dL (ref 6–20)
CO2: 25 mmol/L (ref 22–32)
Calcium: 9.5 mg/dL (ref 8.9–10.3)
Chloride: 105 mmol/L (ref 98–111)
Creatinine, Ser: 1.26 mg/dL — ABNORMAL HIGH (ref 0.61–1.24)
GFR calc Af Amer: >60 mL/min (ref >60)
GFR calc non Af Amer: >60 mL/min (ref >60)
Glucose, Bld: 101 mg/dL — ABNORMAL HIGH (ref 70–99)
Potassium: 3.9 mmol/L (ref 3.5–5.1)
Sodium: 138 mmol/L (ref 135–145)
Total Bilirubin: 1.3 mg/dL — ABNORMAL HIGH (ref 0.3–1.2)
Total Protein: 6.8 g/dL (ref 6.5–8.1)

## 2019-06-17 LAB — SALICYLATE LEVEL: Salicylate Lvl: <7 mg/dL — ABNORMAL LOW (ref 7.0–30.0)

## 2019-06-17 LAB — RAPID URINE DRUG SCREEN, HOSP PERFORMED
Amphetamines: NOT DETECTED
Barbiturates: NOT DETECTED
Benzodiazepines: NOT DETECTED
Cocaine: POSITIVE — AB
Opiates: NOT DETECTED
Tetrahydrocannabinol: NOT DETECTED

## 2019-06-17 LAB — SARS CORONAVIRUS 2 BY RT PCR (HOSPITAL ORDER, PERFORMED IN ~~LOC~~ HOSPITAL LAB): SARS Coronavirus 2: NEGATIVE

## 2019-06-17 LAB — ACETAMINOPHEN LEVEL: Acetaminophen (Tylenol), Serum: <10 ug/mL — ABNORMAL LOW (ref 10–30)

## 2019-06-17 LAB — TROPONIN I (HIGH SENSITIVITY): Troponin I (High Sensitivity): 12 ng/L (ref <18)

## 2019-06-17 LAB — CK: Total CK: 395 U/L (ref 49–397)

## 2019-06-17 MED ORDER — ATORVASTATIN CALCIUM 40 MG PO TABS
40.0000 mg | ORAL_TABLET | Freq: Every day | ORAL | Status: DC
  Administered 2019-06-18 – 2019-06-19 (×2): 40 mg via ORAL
  Filled 2019-06-17 (×3): qty 1

## 2019-06-17 MED ORDER — ALUM & MAG HYDROXIDE-SIMETH 200-200-20 MG/5ML PO SUSP: 30.0000 mL | Freq: Four times a day (QID) | ORAL | Status: DC | PRN

## 2019-06-17 MED ORDER — OLANZAPINE 5 MG PO TBDP
20.0000 mg | ORAL_TABLET | Freq: Every day | ORAL | Status: DC
  Administered 2019-06-17: 20 mg via ORAL
  Filled 2019-06-17 (×4): qty 4

## 2019-06-17 MED ORDER — ONDANSETRON HCL 4 MG PO TABS: 4.0000 mg | ORAL_TABLET | Freq: Three times a day (TID) | ORAL | Status: DC | PRN

## 2019-06-17 MED ORDER — ZIPRASIDONE MESYLATE 20 MG IM SOLR
20.0000 mg | Freq: Once | INTRAMUSCULAR | Status: AC
  Administered 2019-06-17: 20 mg via INTRAMUSCULAR
  Filled 2019-06-17: qty 20

## 2019-06-17 MED ORDER — NICOTINE 21 MG/24HR TD PT24
21.0000 mg | MEDICATED_PATCH | Freq: Every day | TRANSDERMAL | Status: DC
  Administered 2019-06-17 – 2019-06-18 (×2): 21 mg via TRANSDERMAL
  Filled 2019-06-17 (×3): qty 1

## 2019-06-17 MED ORDER — GABAPENTIN 300 MG PO CAPS
300.0000 mg | ORAL_CAPSULE | Freq: Three times a day (TID) | ORAL | Status: DC
  Administered 2019-06-17 – 2019-06-19 (×2): 300 mg via ORAL
  Filled 2019-06-17 (×2): qty 1
  Filled 2019-06-17 (×2): qty 3

## 2019-06-17 MED ORDER — ASPIRIN EC 81 MG PO TBEC
81.0000 mg | DELAYED_RELEASE_TABLET | Freq: Every day | ORAL | Status: DC
  Administered 2019-06-18 – 2019-06-19 (×2): 81 mg via ORAL
  Filled 2019-06-17 (×3): qty 1

## 2019-06-17 MED ORDER — ACETAMINOPHEN 325 MG PO TABS: 650.0000 mg | ORAL_TABLET | ORAL | Status: DC | PRN

## 2019-06-17 MED ORDER — LACTATED RINGERS IV BOLUS
1000.0000 mL | Freq: Once | INTRAVENOUS | Status: AC
  Administered 2019-06-17: 1000 mL via INTRAVENOUS

## 2019-06-17 NOTE — ED Notes (Signed)
TTS attempted, RN informed patient continues to fall asleep, unable to complete assessment. TTS will attempt at later time.

## 2019-06-17 NOTE — BH Assessment (Signed)
Pt's nurse, Skeet Simmer, advised that pt received Geodon and is currently sleeping. RN will alert TTS when pt is ready for consult.
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
## 2019-06-18 LAB — CBG MONITORING, ED: Glucose-Capillary: 81 mg/dL (ref 70–99)

## 2019-06-18 MED ORDER — SODIUM CHLORIDE 0.9 % IV BOLUS
1000.0000 mL | Freq: Once | INTRAVENOUS | Status: AC
  Administered 2019-06-18: 1000 mL via INTRAVENOUS

## 2019-06-18 MED ORDER — ARIPIPRAZOLE ER 300 MG IM SRER
400.0000 mg | INTRAMUSCULAR | Status: DC
  Administered 2019-06-18: 400 mg via INTRAMUSCULAR
  Filled 2019-06-18: qty 3

## 2019-06-18 NOTE — ED Notes (Signed)
Upon RN's arrival RN was notified pt hypotensive. Irene Limbo., RN starting IVF and starting new line

## 2019-06-18 NOTE — ED Notes (Signed)
Lunch Tray Ordered @ 1110. 

## 2019-06-18 NOTE — BH Assessment (Signed)
Clinician contacted pt's nurse, Melanie RN, regarding the possibility of completing pt's BH Assessment with him. Pt's nurse stated pt continues to be sleepy and can barely keep his eyes open. Agreed it would be better to attempt to complete the assessment at a later time. TTS will attempt assessment later.
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
## 2019-06-18 NOTE — ED Notes (Addendum)
Pt arrived to Rm 48 via stretcher - pt remains sleeping. Pt noted w/underwear on - scrubs at bedside. Pt is covered by sheet and blanket. Respirations even, unlabored. Sitter w/pt. Pt needs TTS when awakens.

## 2019-06-18 NOTE — ED Notes (Signed)
Patient woke up long enough to mumble a few words to RN regarding not taking medication; pt would not open eyes-Monique,RN

## 2019-06-18 NOTE — ED Notes (Signed)
IVC paperwork - Copy faxed to [facility] - Copy sent to Medical Records - Original placed in folder for Magistrate - ALL 3 sets in folder. Pt also has warrant paperwork in folder.

## 2019-06-18 NOTE — Progress Notes (Signed)
Patient ID: Brett Salinas, male   DOB: [birth year], 49 y.o.   MRN: [id number]   Per chart review, patient was previously on  Abilify ER 400 mg IM (Maintena) following his discharge from [facility] 05/06/2019 along with  Zyprexa and Neurontin. His Zyprexa and Neurontin was restarted while in the ED. I spoke to patients nurse who spoke to patient and patient stated he had not had the injectable since being discharged. I will re-order Abilify so patient can receive this medication.

## 2019-06-18 NOTE — ED Notes (Signed)
TTS attempting to be performed.

## 2019-06-18 NOTE — ED Notes (Signed)
bfast ordered 

## 2019-06-18 NOTE — ED Notes (Signed)
Pt now awake - TTS aware - Pt ambulated to bathroom and back to room. Lunch given. Pt noted to be calm, cooperative.

## 2019-06-18 NOTE — ED Notes (Signed)
IVC °Dinner ordered °

## 2019-06-19 NOTE — ED Notes (Signed)
Pt is psych cleared per assessment counselor. [employer] notified pt is being discharged
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
## 2019-06-19 NOTE — ED Notes (Signed)
TTS in progress 

## 2019-06-19 NOTE — ED Provider Notes (Signed)
9:15 AM Nursing just reported that the patient is been psychiatrically cleared and thus his IVC can be reversed and he can be discharged into the care of law enforcement.  I went to assess the patient and he was resting calmly and comfortably without agitation.  I informed him that he has been psychiatrically and I will reverse his IVC.  Anticipate discharge to law enforcement after IVC rescinding paperwork has cleared.   Evanee Lubrano, Canary Brim, MD 06/19/19 [id number]

## 2019-07-01 ENCOUNTER — Emergency Department (HOSPITAL_COMMUNITY)
Admission: EM | Admit: 2019-07-01 | Discharge: 2019-07-02 | Disposition: A | Discharge status: Home / Self Care | Payer: Self-pay | Attending: Emergency Medicine | Admitting: Emergency Medicine

## 2019-07-01 ENCOUNTER — Encounter (HOSPITAL_COMMUNITY): Payer: Self-pay | Admitting: Emergency Medicine

## 2019-07-01 ENCOUNTER — Other Ambulatory Visit: Payer: Self-pay

## 2019-07-01 DIAGNOSIS — B351 Tinea unguium: Secondary | ICD-10-CM | POA: Insufficient documentation

## 2019-07-01 DIAGNOSIS — I2510 Atherosclerotic heart disease of native coronary artery without angina pectoris: Secondary | ICD-10-CM | POA: Insufficient documentation

## 2019-07-01 DIAGNOSIS — M79672 Pain in left foot: Secondary | ICD-10-CM | POA: Insufficient documentation

## 2019-07-01 DIAGNOSIS — Z79899 Other long term (current) drug therapy: Secondary | ICD-10-CM | POA: Insufficient documentation

## 2019-07-01 DIAGNOSIS — R443 Hallucinations, unspecified: Secondary | ICD-10-CM | POA: Insufficient documentation

## 2019-07-01 DIAGNOSIS — I10 Essential (primary) hypertension: Secondary | ICD-10-CM | POA: Insufficient documentation

## 2019-07-01 DIAGNOSIS — F250 Schizoaffective disorder, bipolar type: Secondary | ICD-10-CM | POA: Diagnosis present

## 2019-07-01 DIAGNOSIS — Z86718 Personal history of other venous thrombosis and embolism: Secondary | ICD-10-CM | POA: Insufficient documentation

## 2019-07-01 DIAGNOSIS — F1910 Other psychoactive substance abuse, uncomplicated: Secondary | ICD-10-CM | POA: Diagnosis present

## 2019-07-01 DIAGNOSIS — Z5900 Homelessness unspecified: Secondary | ICD-10-CM

## 2019-07-01 DIAGNOSIS — Z955 Presence of coronary angioplasty implant and graft: Secondary | ICD-10-CM | POA: Insufficient documentation

## 2019-07-01 DIAGNOSIS — F1494 Cocaine use, unspecified with cocaine-induced mood disorder: Secondary | ICD-10-CM | POA: Diagnosis present

## 2019-07-01 DIAGNOSIS — Z20822 Contact with and (suspected) exposure to covid-19: Secondary | ICD-10-CM | POA: Insufficient documentation

## 2019-07-01 LAB — CBC
HCT: 38.6 % — ABNORMAL LOW (ref 39.0–52.0)
Hemoglobin: 12.7 g/dL — ABNORMAL LOW (ref 13.0–17.0)
MCH: 30 pg (ref 26.0–34.0)
MCHC: 32.9 g/dL (ref 30.0–36.0)
MCV: 91.3 fL (ref 80.0–100.0)
Platelets: 227 10*3/uL (ref 150–400)
RBC: 4.23 MIL/uL (ref 4.22–5.81)
RDW: 13.2 % (ref 11.5–15.5)
WBC: 6.5 10*3/uL (ref 4.0–10.5)
nRBC: 0 % (ref 0.0–0.2)

## 2019-07-01 LAB — COMPREHENSIVE METABOLIC PANEL
ALT: 12 U/L (ref 0–44)
AST: 20 U/L (ref 15–41)
Albumin: 3.8 g/dL (ref 3.5–5.0)
Alkaline Phosphatase: 66 U/L (ref 38–126)
Anion gap: 9 (ref 5–15)
BUN: 20 mg/dL (ref 6–20)
CO2: 30 mmol/L (ref 22–32)
Calcium: 9.2 mg/dL (ref 8.9–10.3)
Chloride: 105 mmol/L (ref 98–111)
Creatinine, Ser: 1.12 mg/dL (ref 0.61–1.24)
GFR calc Af Amer: >60 mL/min (ref >60)
GFR calc non Af Amer: >60 mL/min (ref >60)
Glucose, Bld: 102 mg/dL — ABNORMAL HIGH (ref 70–99)
Potassium: 4.1 mmol/L (ref 3.5–5.1)
Sodium: 144 mmol/L (ref 135–145)
Total Bilirubin: 0.8 mg/dL (ref 0.3–1.2)
Total Protein: 6.6 g/dL (ref 6.5–8.1)

## 2019-07-01 LAB — ACETAMINOPHEN LEVEL: Acetaminophen (Tylenol), Serum: <10 ug/mL — ABNORMAL LOW (ref 10–30)

## 2019-07-01 LAB — SALICYLATE LEVEL: Salicylate Lvl: <7 mg/dL — ABNORMAL LOW (ref 7.0–30.0)

## 2019-07-01 LAB — ETHANOL: Alcohol, Ethyl (B): <10 mg/dL (ref <10)

## 2019-07-01 LAB — SARS CORONAVIRUS 2 BY RT PCR (HOSPITAL ORDER, PERFORMED IN ~~LOC~~ HOSPITAL LAB): SARS Coronavirus 2: NEGATIVE

## 2019-07-01 NOTE — BHH Counselor (Signed)
Clinician sent Charge Nurse the following message: "Hi. Its Thurston Pounds, Ill be ready to see Brett Salinas in five mintues if in Salinas private room."  Clinician spoke to [place], NS/NT to see if there is Salinas private room the pt can be assessed. Clinician to receive Salinas call back. Call back number given.    Redmond Pulling, MS, [facility], [facility] Triage Specialist [phone].

## 2019-07-01 NOTE — ED Triage Notes (Addendum)
Patient is complaining of hallucinations, feet pain, and sciatica. Patient states that he call the crisis mobility team and they told him to come here. Patient states he does not have any of his medications.
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
## 2019-07-01 NOTE — Progress Notes (Signed)
CSW contacted Peer Support  Brett Salinas.) who will see the pt at 11pm when he comes in for his shift in the ED.  CSW provided pt with some resouces via his RN and will ask the RN to provide them to the pt.  EPD/RN  Updated.  CSW will continue to follow for D/C needs.  Brett Salinas  MSW, LCSW, LCAS, CCS Transitions of Care Clinical Social Worker Care Coordination Department Ph: [phone]

## 2019-07-01 NOTE — ED Notes (Signed)
Blue and Gold top also collected

## 2019-07-02 ENCOUNTER — Encounter (HOSPITAL_COMMUNITY): Payer: Self-pay | Admitting: Registered Nurse

## 2019-07-02 DIAGNOSIS — F1494 Cocaine use, unspecified with cocaine-induced mood disorder: Secondary | ICD-10-CM | POA: Diagnosis present

## 2019-07-02 DIAGNOSIS — Z5900 Homelessness unspecified: Secondary | ICD-10-CM

## 2019-07-02 DIAGNOSIS — R443 Hallucinations, unspecified: Secondary | ICD-10-CM

## 2019-07-02 LAB — RAPID URINE DRUG SCREEN, HOSP PERFORMED
Amphetamines: NOT DETECTED
Barbiturates: NOT DETECTED
Benzodiazepines: NOT DETECTED
Cocaine: POSITIVE — AB
Opiates: NOT DETECTED
Tetrahydrocannabinol: NOT DETECTED

## 2019-07-02 MED ORDER — LOSARTAN POTASSIUM 25 MG PO TABS
25.0000 mg | ORAL_TABLET | Freq: Every day | ORAL | Status: DC
  Filled 2019-07-02: qty 1

## 2019-07-02 MED ORDER — ARIPIPRAZOLE ER 400 MG IM SRER
400.0000 mg | INTRAMUSCULAR | Status: DC
  Administered 2019-07-02: 400 mg via INTRAMUSCULAR

## 2019-07-02 MED ORDER — DICLOFENAC SODIUM 1 % EX GEL
4.0000 g | Freq: Four times a day (QID) | CUTANEOUS | Status: DC
  Administered 2019-07-02: 4 g via TOPICAL
  Filled 2019-07-02: qty 100

## 2019-07-02 MED ORDER — GABAPENTIN 300 MG PO CAPS
300.0000 mg | ORAL_CAPSULE | Freq: Three times a day (TID) | ORAL | Status: DC
  Administered 2019-07-02: 300 mg via ORAL
  Filled 2019-07-02: qty 1

## 2019-07-02 MED ORDER — ATORVASTATIN CALCIUM 40 MG PO TABS
40.0000 mg | ORAL_TABLET | Freq: Every day | ORAL | Status: DC
  Administered 2019-07-02: 40 mg via ORAL
  Filled 2019-07-02: qty 1

## 2019-07-02 MED ORDER — OLANZAPINE 10 MG PO TBDP
20.0000 mg | ORAL_TABLET | Freq: Every day | ORAL | Status: DC
  Administered 2019-07-02: 20 mg via ORAL
  Filled 2019-07-02: qty 2

## 2019-07-02 NOTE — BH Assessment (Signed)
[facility] Assessment Progress Note  Per Shuvon Rankin FNP, this pt does not require psychiatric hospitalization at this time.  Pt is to be discharged from [facility] with outpatient referrals.  At 12:02 this Clinical research associate called [facility] and spoke to [place].  She has scheduled pt for on intake appointment tomorrow, 07/03/2019 at 10:00.  This has been included in pt's discharge instructions.  Per [employer], pt appears to be due for dosing with an injectable medication which he is to receive prior to discharge.  Pt would also benefit from seeing Peer Support Specialists, and a peer support consult has been ordered for pt.  Charge nurse Alexia Freestone has been notified.  Doylene Canning, MA Triage Specialist [phone]

## 2019-07-02 NOTE — ED Notes (Signed)
Patient dressed in burgundy scrubs. Belongings placed in bag along with black book bag in Room 5-8 cabinets.

## 2019-07-02 NOTE — Patient Outreach (Signed)
CPSS met with Pt an was able to gain information to better assist Pt. CPSS was made aware that Pt was seeking help to for change. CPSS issued Pt information to a few different facilities. CPSS will follow up with Pt to see what services Pt wants to utilize.

## 2019-07-02 NOTE — Consult Note (Signed)
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  On evaluation Brett Salinas reports that he has a cocaine use disorder and wants to get help.  Also states that he is homeless and needs a place to stay.  Patient has had multiple hospital presentation with the same complaint.  Patient last psychiatric admission at [facility] 05/03/19.  Patient was started on Abilify Maintena which he states helped with auditory hallucinations but did not follow up with outpatient psychiatric services and has not had the next dose of Abilify injection.  Discussed Brett Salinas) or available half way house and follow up with outpatient psychiatric provider for medication management    During evaluation Brett Salinas is alert/oriented x 4; calm/cooperative; and mood is congruent with affect.  He does not appear to be responding to internal/external stimuli or delusional thoughts; but states that he is having auditory hallucinations.  Patient denies suicidal/self-harm/homicidal ideation, psychosis, and paranoia.  Patient answered question appropriately.   Peer support order to assist with outpatient substance use services, rehab and half-way house services.    Total Time spent with patient: 30 minutes  Past Psychiatric History: See above  Past Medical History:  Past Medical History:  Diagnosis Date  . Achilles tendon  rupture-surgery 09/19/15 09/19/2015  . Altered mental status 12/23/2016  . Arthritis    "hips, knees, back" (10/13/2015)  . Bacterial conjunctivitis of right eye 2018  . Bipolar disorder (HCC)   . CAD -S/P PCI LAD/DES 10/13/15 10/14/2015  . CHF (congestive heart failure) (HCC)   . Cocaine abuse-drug sceen positive, History of 10/14/2015  . Coronary artery disease   . Depression   . DVT (deep venous thrombosis) (HCC) 09/2015   right  . Essential hypertension 11/30/2015  . Heart murmur   . High cholesterol   . Hypertension   . Myocardial infarction (HCC) 10/12/2015 X 2-3  . NSTEMI (non-ST elevated myocardial infarction) (HCC)   . Osteoarthritis   . Rectal myiasis   . Schizoaffective disorder, bipolar type (HCC) 10/14/2015  . Schizoaffective disorder, bipolar type (HCC)   . Sciatica     Past Surgical History:  Procedure Laterality Date  . CARDIAC CATHETERIZATION N/A 10/13/2015   Procedure: Left Heart Cath and Coronary Angiography;  Surgeon: Corky Crafts, MD;  Location: [facility] INVASIVE CV LAB;  Service: Cardiovascular;  Laterality: N/A;  . CARDIAC CATHETERIZATION N/A 10/13/2015   Procedure: Coronary Stent Intervention;  Surgeon: Corky Crafts, MD;  Location: [facility] INVASIVE CV LAB;  Service: Cardiovascular;  Laterality: N/A;  . CARDIAC CATHETERIZATION N/A 10/13/2015   Procedure: Intravascular Ultrasound/IVUS;  Surgeon: Corky Crafts, MD;  Location: [facility] INVASIVE CV LAB;  Service: Cardiovascular;  Laterality: N/A;  . CORONARY ANGIOPLASTY WITH STENT PLACEMENT  10/13/2015  . I & D EXTREMITY Right 09/19/2015   Procedure: IRRIGATION AND DEBRIDEMENT ANKLE LACERATIONS POSSIBLE TENDON REPAIR;  Surgeon: Cammy Copa, MD;  [facility] OR;  Service: Orthopedics;  Laterality: Right;  . I & D EXTREMITY Right 10/14/2015   Procedure: IRRIGATION AND DEBRIDEMENT EXTREMITY/Right Ankle;  Surgeon: Meredith Pel, MD;  Location: [place];  Service: Orthopedics;  Laterality: Right;   Family History:  History reviewed. No pertinent family history. Family Psychiatric  History: Denies Social History:  Social History   Substance and Sexual Activity  Alcohol Use Yes     Social History   Substance and Sexual Activity  Drug Use Yes  . Types: Cocaine, Marijuana   Comment: 10/13/2015 "I've tried alot; I don't have any habits"    Social History   Socioeconomic History  . Marital status: Single    Spouse name: Not on file  . Number of children: Not on file  . Years of education: Not on file  . Highest education level: Not on file  Occupational History  . Not on file  Tobacco Use  . Smoking status: Never Smoker  . Smokeless tobacco: Never Used  Substance and Sexual Activity  . Alcohol use: Yes  . Drug use: Yes    Types: Cocaine, Marijuana    Comment: 10/13/2015 "I've tried alot; I don't have any habits"  . Sexual activity: Not on file  Other Topics Concern  . Not on file  Social History Narrative   ** Merged History Encounter **       Social Determinants of Health   Financial Resource Strain:   . Difficulty of Paying Living Expenses:   Food Insecurity:   . Worried About Charity fundraiser in the Last Year:   . Arboriculturist in the Last Year:   Transportation Needs:   . Film/video editor (Medical):   Marland Kitchen Lack of Transportation (Non-Medical):   Physical Activity:   . Days of Exercise per Week:   . Minutes of Exercise per Session:   Stress:   . Feeling of Stress :   Social Connections:   . Frequency of Communication with Friends and Family:   . Frequency of Social Gatherings with Friends and Family:   . Attends Religious Services:   . Active Member of Clubs or Organizations:   . Attends Archivist Meetings:   Marland Kitchen Marital Status:     Has this patient used any form of tobacco in the last 30 days? (Cigarettes, Smokeless Tobacco, Cigars, and/or Pipes) Prescription not provided because: Does not use tobacco products  Current Medications: Current  Facility-Administered Medications  Medication Dose Route Frequency Provider Last Rate Last Admin  . [START ON 07/15/2019] ARIPiprazole ER (ABILIFY MAINTENA) injection 400 mg  400 mg Intramuscular Q28 days Brett Headings B, NP      . atorvastatin (LIPITOR) tablet 40 mg  40 mg Oral Daily Brett Evangelist, Brett Salinas      . diclofenac Sodium (VOLTAREN) 1 % topical gel 4 g  4 g Topical QID Brett Evangelist, Brett Salinas      . gabapentin (NEURONTIN) capsule 300 mg  300 mg Oral TID Brett Evangelist, Brett Salinas      . losartan (COZAAR) tablet 25 mg  25 mg Oral Daily Brett Evangelist, Brett Salinas      . OLANZapine zydis (ZYPREXA) disintegrating tablet 20 mg  20 mg Oral QHS Brett Evangelist, Brett Salinas   20 mg at 07/02/19 3009   Current Outpatient Medications  Medication Sig Dispense Refill  . ARIPiprazole ER (ABILIFY MAINTENA) 400 MG SRER injection Inject 2 mLs (400 mg total) into the muscle every 28 (twenty-eight) days. Due 5/1 1  each 11  . diclofenac Sodium (VOLTAREN) 1 % GEL Apply 4 g topically 4 (four) times daily. 100 g 0  . gabapentin (NEURONTIN) 300 MG capsule Take 1 capsule (300 mg total) by mouth 3 (three) times daily. 90 capsule 2  . OLANZapine zydis (ZYPREXA) 20 MG disintegrating tablet Take 1 tablet (20 mg total) by mouth at bedtime. 90 tablet 2  . atorvastatin (LIPITOR) 40 MG tablet Take 1 tablet (40 mg total) by mouth daily. (Patient not taking: Reported on 05/02/2019) 90 tablet 3  . losartan (COZAAR) 25 MG tablet Take 1 tablet (25 mg total) by mouth daily. (Patient not taking: Reported on 05/02/2019) 90 tablet 3   PTA Medications: (Not in a hospital admission)   Musculoskeletal: Strength & Muscle Tone: within normal limits Gait & Station: normal Patient leans: N/A  Psychiatric Specialty Exam: Physical Exam Vitals and nursing note reviewed.  Constitutional:      Appearance: Normal appearance.  Pulmonary:     Effort: Pulmonary effort is normal.  Neurological:     Mental Status: He is alert.   Psychiatric:        Attention and Perception: Attention and perception normal.        Mood and Affect: Mood is anxious (Stable) and depressed (Stable).        Speech: Speech normal.        Behavior: Behavior normal. Behavior is cooperative.        Thought Content: Thought content is not paranoid or delusional. Thought content does not include suicidal ideation. Homicidal: Reporting auditory hallucinations; but does not appear to be responding to any internal or external stimuli.        Cognition and Memory: Cognition and memory normal.        Judgment: Judgment is impulsive.     Review of Systems  Psychiatric/Behavioral: Positive for hallucinations. Negative for agitation, behavioral problems, confusion, self-injury, sleep disturbance and suicidal ideas. Nervous/anxious: Stable.        Patient states that he is hearing voices but unable to tell what voices are saying.  States "I am hearing extra transmission from people around me; like what's going on around me, how life is playing out, a picture by picture or things."  All other systems reviewed and are negative.   Blood pressure (!) 129/94, pulse 62, temperature 97.7 F (36.5 C), temperature source Oral, resp. rate 16, height 6\' 7"  (2.007 m), weight 90.7 kg, SpO2 100 %.Body mass index is 22.53 kg/m.  General Appearance: Casual  Eye Contact:  Good  Speech:  Clear and Coherent and Normal Rate  Volume:  Normal  Mood:  Anxious and Depressed  Affect:  Appropriate and Congruent  Thought Process:  Coherent, Goal Directed and Descriptions of Associations: Intact  Orientation:  Full (Time, Place, and Person)  Thought Content:  Hallucinations: Auditory  Suicidal Thoughts:  No  Homicidal Thoughts:  No  Memory:  Immediate;   Good Recent;   Good  Judgement:  Intact  Insight:  Present  Psychomotor Activity:  Normal  Concentration:  Concentration: Good and Attention Span: Good  Recall:  Good  Fund of Knowledge:  Fair  Language:  Good   Akathisia:  No  Handed:  Right  AIMS (if indicated):     Assets:  Communication Skills Desire for Improvement  ADL's:  Intact  Cognition:  WNL  Sleep:       Demographic Factors:  Male, Low socioeconomic status and Unemployed  Loss Factors: Homeless  Historical Factors: Impulsivity  Risk Reduction Factors:   Religious beliefs about death  Continued Clinical Symptoms:  Alcohol/Substance Abuse/Dependencies Previous Psychiatric Diagnoses and Treatments  Cognitive Features That Contribute To Risk:  None    Suicide Risk:  Minimal: No identifiable suicidal ideation.  Patients presenting with no risk factors but with morbid ruminations; may be classified as minimal risk based on the severity of the depressive symptoms   Plan Of Care/Follow-up recommendations:  Activity:  As tolerated Diet:  Heart healthy    Discharge Instructions     For your behavioral health needs, you are advised to follow up with [facility].  You have an intake appointment scheduled for tomorrow, Wednesday, July 03, 2019 at 10:00 am:       [facility]      [address]      [place], [place] [address]      [phone]    Disposition:  Psychiatrically Cleared No evidence of imminent risk to self or others at present.   Patient does not meet criteria for psychiatric inpatient admission. Supportive therapy provided about ongoing stressors. Discussed crisis plan, support from social network, calling 911, coming to the Emergency Department, and calling Suicide Hotline.  Tyshon Fanning, NP 07/02/2019, 12:46 PM

## 2019-07-02 NOTE — ED Notes (Signed)
Patient is unable sign due to no signature pad.

## 2019-07-02 NOTE — Discharge Instructions (Signed)
For your behavioral health needs, you are advised to follow up with [facility].  You have an intake appointment scheduled for tomorrow, Wednesday, July 03, 2019 at 10:00 am:       [facility]      [address]      [place], [place] [address]      [phone]

## 2019-07-03 ENCOUNTER — Ambulatory Visit (HOSPITAL_COMMUNITY): Payer: Federal, State, Local not specified - Other | Admitting: Licensed Clinical Social Worker

## 2019-07-08 ENCOUNTER — Ambulatory Visit (HOSPITAL_COMMUNITY): Payer: Federal, State, Local not specified - Other | Admitting: Psychiatry

## 2019-07-11 ENCOUNTER — Ambulatory Visit (HOSPITAL_COMMUNITY): Payer: Federal, State, Local not specified - Other | Admitting: Licensed Clinical Social Worker

## 2019-07-16 ENCOUNTER — Emergency Department (HOSPITAL_COMMUNITY)
Admission: EM | Admit: 2019-07-16 | Discharge: 2019-07-16 | Payer: Self-pay | Attending: Emergency Medicine | Admitting: Emergency Medicine

## 2019-07-16 ENCOUNTER — Other Ambulatory Visit: Payer: Self-pay

## 2019-07-16 ENCOUNTER — Encounter (HOSPITAL_COMMUNITY): Payer: Self-pay

## 2019-07-16 DIAGNOSIS — F1910 Other psychoactive substance abuse, uncomplicated: Secondary | ICD-10-CM | POA: Insufficient documentation

## 2019-07-16 DIAGNOSIS — J449 Chronic obstructive pulmonary disease, unspecified: Secondary | ICD-10-CM | POA: Insufficient documentation

## 2019-07-16 DIAGNOSIS — I252 Old myocardial infarction: Secondary | ICD-10-CM | POA: Insufficient documentation

## 2019-07-16 DIAGNOSIS — I10 Essential (primary) hypertension: Secondary | ICD-10-CM | POA: Insufficient documentation

## 2019-07-16 DIAGNOSIS — R451 Restlessness and agitation: Secondary | ICD-10-CM | POA: Insufficient documentation

## 2019-07-16 DIAGNOSIS — Z79899 Other long term (current) drug therapy: Secondary | ICD-10-CM | POA: Insufficient documentation

## 2019-07-16 DIAGNOSIS — I2510 Atherosclerotic heart disease of native coronary artery without angina pectoris: Secondary | ICD-10-CM | POA: Insufficient documentation

## 2019-07-16 DIAGNOSIS — Z955 Presence of coronary angioplasty implant and graft: Secondary | ICD-10-CM | POA: Insufficient documentation

## 2019-07-16 NOTE — ED Triage Notes (Signed)
Pt BIB [facility] for medical clearance. Pt was in police custody when he began banging his head on the glass in the police car. [employer] called EMS for restraints. Pt admitted to doing sheet rock, synthetic cocaine. EMS gave 2.5 of midazolam for agitation.

## 2019-07-16 NOTE — ED Provider Notes (Signed)
[facility] EMERGENCY DEPARTMENT Provider Note   CSN: [id number] Arrival date & time: 07/16/19  7902     History Chief Complaint  Patient presents with  . Medical Clearance    Brett Salinas is a 49 y.o. male.  Patient brought to the emergency department for medical evaluation.  Patient was reportedly arrested tonight on outstanding warrants and during transport he started banging his head on the glass in the police car.  Police were unable to redirect him.  The called EMS who administered Versed.  Patient admits to doing some type of synthetic cocaine tonight.        Past Medical History:  Diagnosis Date  . Achilles tendon rupture-surgery 09/19/15 09/19/2015  . Altered mental status 12/23/2016  . Arthritis    "hips, knees, back" (10/13/2015)  . Bacterial conjunctivitis of right eye 2018  . Bipolar disorder (HCC)   . CAD -S/P PCI LAD/DES 10/13/15 10/14/2015  . CHF (congestive heart failure) (HCC)   . Cocaine abuse-drug sceen positive, History of 10/14/2015  . Coronary artery disease   . Depression   . DVT (deep venous thrombosis) (HCC) 09/2015   right  . Essential hypertension 11/30/2015  . Heart murmur   . High cholesterol   . Hypertension   . Myocardial infarction (HCC) 10/12/2015 X 2-3  . NSTEMI (non-ST elevated myocardial infarction) (HCC)   . Osteoarthritis   . Rectal myiasis   . Schizoaffective disorder, bipolar type (HCC) 10/14/2015  . Schizoaffective disorder, bipolar type (HCC)   . Sciatica     Patient Active Problem List   Diagnosis Date Noted  . 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 Homelessness 07/02/2019  . Hallucinations   . Polysubstance abuse (HCC)   . AKI (acute kidney injury) (HCC) 09/25/2018  . Tobacco use disorder 12/29/2016  . COPD (chronic obstructive pulmonary disease) (HCC) 12/28/2016  . Dyslipidemia 12/28/2016  . 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 Drug abuse (HCC)     . Agitation 12/24/2016  . Aggression 12/24/2016  . Altered mental status 12/23/2016  . History of ED visit for Medical clearance for incarceration for alleged aggressive behavior and crack cocaine use 09/05/2016  . Osteoarthritis 07/20/2016  . Essential hypertension 11/30/2015  . CAD -S/P PCI LAD/DES 10/13/15 10/14/2015  . Wound infection- for I&D achilles wound 10/14/15 10/14/2015  . Cocaine abuse-drug sceen positive, History of 10/14/2015  . Schizoaffective disorder, bipolar type (HCC) 10/14/2015  . Infection 10/14/2015  . NSTEMI (non-ST elevated myocardial infarction) (HCC)   . Chest pain 10/12/2015  . Achilles tendon rupture-surgery 09/19/15 09/19/2015    Past Surgical History:  Procedure Laterality Date  . CARDIAC CATHETERIZATION N/A 10/13/2015   Procedure: Left Heart Cath and Coronary Angiography;  Surgeon: Corky Crafts, MD;  Location: [facility] INVASIVE CV LAB;  Service: Cardiovascular;  Laterality: N/A;  . CARDIAC CATHETERIZATION N/A 10/13/2015   Procedure: Coronary Stent Intervention;  Surgeon: Corky Crafts, MD;  Location: [facility] INVASIVE CV LAB;  Service: Cardiovascular;  Laterality: N/A;  . CARDIAC CATHETERIZATION N/A 10/13/2015   Procedure: Intravascular Ultrasound/IVUS;  Surgeon: Corky Crafts, MD;  Location: [facility] INVASIVE CV LAB;  Service: Cardiovascular;  Laterality: N/A;  . CORONARY ANGIOPLASTY WITH STENT PLACEMENT  10/13/2015  . I & D EXTREMITY Right 09/19/2015   Procedure: IRRIGATION AND DEBRIDEMENT ANKLE LACERATIONS POSSIBLE TENDON REPAIR;  Surgeon: Cammy Copa, MD;  Location: [facility] OR;  Service: Orthopedics;  Laterality: Right;  . I & D EXTREMITY Right  10/14/2015   Procedure: IRRIGATION AND DEBRIDEMENT EXTREMITY/Right Ankle;  Surgeon: Meredith Pel, MD;  Location: [place];  Service: Orthopedics;  Laterality: Right;       History reviewed. No pertinent family history.  Social History   Tobacco Use  . Smoking status: Never Smoker  . Smokeless tobacco:  Never Used  Substance Use Topics  . Alcohol use: Yes  . Drug use: Yes    Types: Cocaine, Marijuana    Comment: 10/13/2015 "I've tried alot; I don't have any habits"    Home Medications Prior to Admission medications   Medication Sig Start Date End Date Taking? Authorizing Provider  ARIPiprazole ER (ABILIFY MAINTENA) 400 MG SRER injection Inject 2 mLs (400 mg total) into the muscle every 28 (twenty-eight) days. Due 5/1 05/06/19   Johnn Hai, MD  atorvastatin (LIPITOR) 40 MG tablet Take 1 tablet (40 mg total) by mouth daily. Patient not taking: Reported on 05/02/2019 03/11/19   Dorothy Spark, MD  diclofenac Sodium (VOLTAREN) 1 % GEL Apply 4 g topically 4 (four) times daily. 03/17/19   Blanchie Dessert, MD  gabapentin (NEURONTIN) 300 MG capsule Take 1 capsule (300 mg total) by mouth 3 (three) times daily. 05/06/19   Johnn Hai, MD  losartan (COZAAR) 25 MG tablet Take 1 tablet (25 mg total) by mouth daily. Patient not taking: Reported on 05/02/2019 03/11/19   Dorothy Spark, MD  OLANZapine zydis (ZYPREXA) 20 MG disintegrating tablet Take 1 tablet (20 mg total) by mouth at bedtime. 05/06/19   Johnn Hai, MD    Allergies    Catfish [fish allergy], Heparin, and Pork-derived products  Review of Systems   Review of Systems  Psychiatric/Behavioral: Positive for agitation.  All other systems reviewed and are negative.   Physical Exam Updated Vital Signs BP (!) 132/95 (BP Location: Right Arm)   Pulse (!) 115   Temp 98.3 F (36.8 C) (Oral)   Resp 20   SpO2 100%   Physical Exam Vitals and nursing note reviewed.  Constitutional:      General: He is not in acute distress.    Appearance: Normal appearance. He is well-developed.  HENT:     Head: Normocephalic and atraumatic.     Right Ear: Hearing normal.     Left Ear: Hearing normal.     Nose: Nose normal.  Eyes:     Conjunctiva/sclera: Conjunctivae normal.     Pupils: Pupils are equal, round, and reactive to light.    Cardiovascular:     Rate and Rhythm: Regular rhythm.     Heart sounds: S1 normal and S2 normal. No murmur heard.  No friction rub. No gallop.   Pulmonary:     Effort: Pulmonary effort is normal. No respiratory distress.     Breath sounds: Normal breath sounds.  Chest:     Chest wall: No tenderness.  Abdominal:     General: Bowel sounds are normal.     Palpations: Abdomen is soft.     Tenderness: There is no abdominal tenderness. There is no guarding or rebound. Negative signs include Murphy's sign and McBurney's sign.     Hernia: No hernia is present.  Musculoskeletal:        General: Normal range of motion.     Cervical back: Normal range of motion and neck supple.  Skin:    General: Skin is warm and dry.     Findings: No rash.  Neurological:     Mental Status: He is alert and oriented  to person, place, and time.     GCS: GCS eye subscore is 4. GCS verbal subscore is 5. GCS motor subscore is 6.     Cranial Nerves: No cranial nerve deficit.     Sensory: No sensory deficit.     Coordination: Coordination normal.  Psychiatric:        Speech: Speech normal.        Behavior: Behavior normal.        Thought Content: Thought content normal.     ED Results / Procedures / Treatments   Labs (all labs ordered are listed, but only abnormal results are displayed) Labs Reviewed - No data to display  EKG None  Radiology No results found.  Procedures Procedures (including critical care time)  Medications Ordered in ED Medications - No data to display  ED Course  I have reviewed the triage vital signs and the nursing notes.  Pertinent labs & imaging results that were available during my care of the patient were reviewed by me and considered in my medical decision making (see chart for details).    MDM Rules/Calculators/A&P                          Patient appears to be calm and cooperative at arrival.  The paramedic that brought him to the emergency department  unfortunately stuck herself after administering the Versed to the patient.  He agrees to hepatitis and HIV testing which was drawn as the source of a blood exposure.  Patient is without other complaints at this time.  He does not appear to need a work-up.  He is awake alert and oriented.  I do not see any evidence of swelling, abrasions, lacerations, etc. on his forehead from banging his head.  No focal neurologic deficits.  Patient does not require imaging.  Final Clinical Impression(s) / ED Diagnoses Final diagnoses:  Substance abuse (HCC)  Agitation    Rx / DC Orders ED Discharge Orders    None       Silvia Hightower, Canary Brim, MD 07/16/19 [id number]

## 2019-07-24 ENCOUNTER — Ambulatory Visit (HOSPITAL_COMMUNITY): Payer: Federal, State, Local not specified - Other | Admitting: Psychiatry

## 2020-02-09 ENCOUNTER — Encounter (HOSPITAL_COMMUNITY): Payer: Self-pay | Admitting: Emergency Medicine

## 2020-02-09 ENCOUNTER — Emergency Department (HOSPITAL_COMMUNITY)
Admission: EM | Admit: 2020-02-09 | Discharge: 2020-02-11 | Disposition: A | Discharge status: Home / Self Care | Payer: Self-pay | Attending: Emergency Medicine | Admitting: Emergency Medicine

## 2020-02-09 ENCOUNTER — Other Ambulatory Visit: Payer: Self-pay

## 2020-02-09 DIAGNOSIS — Z5900 Homelessness unspecified: Secondary | ICD-10-CM

## 2020-02-09 DIAGNOSIS — F1994 Other psychoactive substance use, unspecified with psychoactive substance-induced mood disorder: Secondary | ICD-10-CM

## 2020-02-09 DIAGNOSIS — F1520 Other stimulant dependence, uncomplicated: Secondary | ICD-10-CM | POA: Diagnosis present

## 2020-02-09 DIAGNOSIS — F1910 Other psychoactive substance abuse, uncomplicated: Secondary | ICD-10-CM | POA: Diagnosis present

## 2020-02-09 DIAGNOSIS — I2510 Atherosclerotic heart disease of native coronary artery without angina pectoris: Secondary | ICD-10-CM | POA: Insufficient documentation

## 2020-02-09 DIAGNOSIS — F259 Schizoaffective disorder, unspecified: Secondary | ICD-10-CM | POA: Insufficient documentation

## 2020-02-09 DIAGNOSIS — J449 Chronic obstructive pulmonary disease, unspecified: Secondary | ICD-10-CM | POA: Insufficient documentation

## 2020-02-09 DIAGNOSIS — F1494 Cocaine use, unspecified with cocaine-induced mood disorder: Secondary | ICD-10-CM

## 2020-02-09 DIAGNOSIS — F250 Schizoaffective disorder, bipolar type: Secondary | ICD-10-CM | POA: Diagnosis present

## 2020-02-09 DIAGNOSIS — Z79899 Other long term (current) drug therapy: Secondary | ICD-10-CM | POA: Insufficient documentation

## 2020-02-09 DIAGNOSIS — F19959 Other psychoactive substance use, unspecified with psychoactive substance-induced psychotic disorder, unspecified: Secondary | ICD-10-CM | POA: Diagnosis present

## 2020-02-09 DIAGNOSIS — I110 Hypertensive heart disease with heart failure: Secondary | ICD-10-CM | POA: Insufficient documentation

## 2020-02-09 DIAGNOSIS — Z20822 Contact with and (suspected) exposure to covid-19: Secondary | ICD-10-CM | POA: Insufficient documentation

## 2020-02-09 DIAGNOSIS — I509 Heart failure, unspecified: Secondary | ICD-10-CM | POA: Insufficient documentation

## 2020-02-09 DIAGNOSIS — I5033 Acute on chronic diastolic (congestive) heart failure: Secondary | ICD-10-CM

## 2020-02-09 DIAGNOSIS — J438 Other emphysema: Secondary | ICD-10-CM

## 2020-02-09 LAB — COMPREHENSIVE METABOLIC PANEL
ALT: 16 U/L (ref 0–44)
AST: 32 U/L (ref 15–41)
Albumin: 4.2 g/dL (ref 3.5–5.0)
Alkaline Phosphatase: 59 U/L (ref 38–126)
Anion gap: 13 (ref 5–15)
BUN: 22 mg/dL — ABNORMAL HIGH (ref 6–20)
CO2: 22 mmol/L (ref 22–32)
Calcium: 9.2 mg/dL (ref 8.9–10.3)
Chloride: 107 mmol/L (ref 98–111)
Creatinine, Ser: 1.07 mg/dL (ref 0.61–1.24)
GFR, Estimated: >60 mL/min (ref >60)
Glucose, Bld: 78 mg/dL (ref 70–99)
Potassium: 3.6 mmol/L (ref 3.5–5.1)
Sodium: 142 mmol/L (ref 135–145)
Total Bilirubin: 1.3 mg/dL — ABNORMAL HIGH (ref 0.3–1.2)
Total Protein: 7.3 g/dL (ref 6.5–8.1)

## 2020-02-09 LAB — CBC WITH DIFFERENTIAL/PLATELET
Abs Immature Granulocytes: 0.01 10*3/uL (ref 0.00–0.07)
Basophils Absolute: 0 10*3/uL (ref 0.0–0.1)
Basophils Relative: 1 %
Eosinophils Absolute: 0.1 10*3/uL (ref 0.0–0.5)
Eosinophils Relative: 2 %
HCT: 40.3 % (ref 39.0–52.0)
Hemoglobin: 13.3 g/dL (ref 13.0–17.0)
Immature Granulocytes: 0 %
Lymphocytes Relative: 24 %
Lymphs Abs: 1.3 10*3/uL (ref 0.7–4.0)
MCH: 30.2 pg (ref 26.0–34.0)
MCHC: 33 g/dL (ref 30.0–36.0)
MCV: 91.6 fL (ref 80.0–100.0)
Monocytes Absolute: 0.4 10*3/uL (ref 0.1–1.0)
Monocytes Relative: 6 %
Neutro Abs: 3.7 10*3/uL (ref 1.7–7.7)
Neutrophils Relative %: 67 %
Platelets: 196 10*3/uL (ref 150–400)
RBC: 4.4 MIL/uL (ref 4.22–5.81)
RDW: 13.3 % (ref 11.5–15.5)
WBC: 5.6 10*3/uL (ref 4.0–10.5)
nRBC: 0 % (ref 0.0–0.2)

## 2020-02-09 LAB — RESP PANEL BY RT-PCR (FLU A&B, COVID) ARPGX2
Influenza A by PCR: NEGATIVE
Influenza B by PCR: NEGATIVE
SARS Coronavirus 2 by RT PCR: NEGATIVE

## 2020-02-09 LAB — ETHANOL: Alcohol, Ethyl (B): <10 mg/dL (ref <10)

## 2020-02-09 MED ORDER — ARIPIPRAZOLE ER 400 MG IM SRER
400.0000 mg | INTRAMUSCULAR | Status: DC
  Administered 2020-02-09: 400 mg via INTRAMUSCULAR
  Filled 2020-02-09: qty 2

## 2020-02-09 MED ORDER — ZIPRASIDONE MESYLATE 20 MG IM SOLR
10.0000 mg | Freq: Once | INTRAMUSCULAR | Status: AC
  Administered 2020-02-09: 10 mg via INTRAMUSCULAR
  Filled 2020-02-09: qty 20

## 2020-02-09 MED ORDER — STERILE WATER FOR INJECTION IJ SOLN
INTRAMUSCULAR | Status: AC
  Filled 2020-02-09: qty 10

## 2020-02-09 MED ORDER — GABAPENTIN 300 MG PO CAPS
300.0000 mg | ORAL_CAPSULE | Freq: Three times a day (TID) | ORAL | Status: DC
  Administered 2020-02-09 – 2020-02-10 (×4): 300 mg via ORAL
  Filled 2020-02-09 (×4): qty 1

## 2020-02-09 NOTE — ED Provider Notes (Signed)
[facility]-EMERGENCY DEPT Provider Note   CSN: [id number] Arrival date & time: 02/09/20  0410     History Chief Complaint  Patient presents with  . Mental Health Problem    Brett Salinas is a 50 y.o. male.  50 year old male with history of bipolar disorder presents with increased agitation.  Patient is now cooperative with this exam.  Patient was fine to get station by EMS responding to internal stimuli.  Since being here, he has been speaking nonsensically.  Review of patient's medical record shows that he also suffers from schizoaffective disorder as well as cocaine addiction.  No further history obtainable due to his current state        Past Medical History:  Diagnosis Date  . Achilles tendon rupture-surgery 09/19/15 09/19/2015  . Altered mental status 12/23/2016  . Arthritis    "hips, knees, back" (10/13/2015)  . Bacterial conjunctivitis of right eye 2018  . Bipolar disorder (HCC)   . CAD -S/P PCI LAD/DES 10/13/15 10/14/2015  . CHF (congestive heart failure) (HCC)   . Cocaine abuse-drug sceen positive, History of 10/14/2015  . Coronary artery disease   . Depression   . DVT (deep venous thrombosis) (HCC) 09/2015   right  . Essential hypertension 11/30/2015  . Heart murmur   . High cholesterol   . Hypertension   . Myocardial infarction (HCC) 10/12/2015 X 2-3  . NSTEMI (non-ST elevated myocardial infarction) (HCC)   . Osteoarthritis   . Rectal myiasis   . Schizoaffective disorder, bipolar type (HCC) 10/14/2015  . Schizoaffective disorder, bipolar type (HCC)   . Sciatica     Patient Active Problem List   Diagnosis Date Noted  . 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 Homelessness 07/02/2019  . Hallucinations   . Polysubstance abuse (HCC)   . AKI (acute kidney injury) (HCC) 09/25/2018  . Tobacco use disorder 12/29/2016  . COPD (chronic obstructive pulmonary disease) (HCC) 12/28/2016  . Dyslipidemia 12/28/2016   . 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 Drug abuse (HCC)   . Agitation 12/24/2016  . Aggression 12/24/2016  . Altered mental status 12/23/2016  . History of ED visit for Medical clearance for incarceration for alleged aggressive behavior and crack cocaine use 09/05/2016  . Osteoarthritis 07/20/2016  . Essential hypertension 11/30/2015  . CAD -S/P PCI LAD/DES 10/13/15 10/14/2015  . Wound infection- for I&D achilles wound 10/14/15 10/14/2015  . Cocaine abuse-drug sceen positive, History of 10/14/2015  . Schizoaffective disorder, bipolar type (HCC) 10/14/2015  . Infection 10/14/2015  . NSTEMI (non-ST elevated myocardial infarction) (HCC)   . Chest pain 10/12/2015  . Achilles tendon rupture-surgery 09/19/15 09/19/2015    Past Surgical History:  Procedure Laterality Date  . CARDIAC CATHETERIZATION N/A 10/13/2015   Procedure: Left Heart Cath and Coronary Angiography;  Surgeon: Corky Crafts, MD;  Location: [facility] INVASIVE CV LAB;  Service: Cardiovascular;  Laterality: N/A;  . CARDIAC CATHETERIZATION N/A 10/13/2015   Procedure: Coronary Stent Intervention;  Surgeon: Corky Crafts, MD;  Location: [facility] INVASIVE CV LAB;  Service: Cardiovascular;  Laterality: N/A;  . CARDIAC CATHETERIZATION N/A 10/13/2015   Procedure: Intravascular Ultrasound/IVUS;  Surgeon: Corky Crafts, MD;  Location: [facility] INVASIVE CV LAB;  Service: Cardiovascular;  Laterality: N/A;  . CORONARY ANGIOPLASTY WITH STENT PLACEMENT  10/13/2015  . I & D EXTREMITY Right 09/19/2015   Procedure: IRRIGATION AND DEBRIDEMENT ANKLE LACERATIONS POSSIBLE TENDON REPAIR;  Surgeon: Cammy Copa, MD;  Location: [facility] OR;  Service:  Orthopedics;  Laterality: Right;  . I & D EXTREMITY Right 10/14/2015   Procedure: IRRIGATION AND DEBRIDEMENT EXTREMITY/Right Ankle;  Surgeon: Cammy Copa, MD;  Location: [facility] OR;  Service: Orthopedics;  Laterality: Right;       History reviewed. No pertinent family history.  Social  History   Tobacco Use  . Smoking status: Never Smoker  . Smokeless tobacco: Never Used  Substance Use Topics  . Alcohol use: Yes  . Drug use: Yes    Types: Cocaine, Marijuana    Comment: 10/13/2015 "I've tried alot; I don't have any habits"    Home Medications Prior to Admission medications   Medication Sig Start Date End Date Taking? Authorizing Provider  ARIPiprazole ER (ABILIFY MAINTENA) 400 MG SRER injection Inject 2 mLs (400 mg total) into the muscle every 28 (twenty-eight) days. Due 5/1 05/06/19   Malvin Johns, MD  atorvastatin (LIPITOR) 40 MG tablet Take 1 tablet (40 mg total) by mouth daily. Patient not taking: Reported on 05/02/2019 03/11/19   Lars Masson, MD  diclofenac Sodium (VOLTAREN) 1 % GEL Apply 4 g topically 4 (four) times daily. 03/17/19   Gwyneth Sprout, MD  gabapentin (NEURONTIN) 300 MG capsule Take 1 capsule (300 mg total) by mouth 3 (three) times daily. 05/06/19   Malvin Johns, MD  losartan (COZAAR) 25 MG tablet Take 1 tablet (25 mg total) by mouth daily. Patient not taking: Reported on 05/02/2019 03/11/19   Lars Masson, MD  OLANZapine zydis (ZYPREXA) 20 MG disintegrating tablet Take 1 tablet (20 mg total) by mouth at bedtime. 05/06/19   Malvin Johns, MD    Allergies    Lucita Ferrara allergy], Heparin, and Pork-derived products  Review of Systems   Review of Systems  Unable to perform ROS: Psychiatric disorder    Physical Exam Updated Vital Signs BP 106/79 (BP Location: Right Arm)   Pulse (!) 105   Resp 18   SpO2 100%   Physical Exam Vitals and nursing note reviewed.  Constitutional:      General: He is not in acute distress.    Appearance: Normal appearance. He is well-developed and well-nourished. He is not toxic-appearing.  HENT:     Head: Normocephalic and atraumatic.  Eyes:     General: Lids are normal.     Extraocular Movements: EOM normal.     Conjunctiva/sclera: Conjunctivae normal.     Pupils: Pupils are equal, round, and reactive to  light.  Neck:     Thyroid: No thyroid mass.     Trachea: No tracheal deviation.  Cardiovascular:     Rate and Rhythm: Normal rate and regular rhythm.     Heart sounds: Normal heart sounds. No murmur heard. No gallop.   Pulmonary:     Effort: Pulmonary effort is normal. No respiratory distress.     Breath sounds: Normal breath sounds. No stridor. No decreased breath sounds, wheezing, rhonchi or rales.  Abdominal:     General: Bowel sounds are normal. There is no distension.     Palpations: Abdomen is soft.     Tenderness: There is no abdominal tenderness. There is no CVA tenderness or rebound.  Musculoskeletal:        General: No tenderness or edema. Normal range of motion.     Cervical back: Normal range of motion and neck supple.  Skin:    General: Skin is warm and dry.     Findings: No abrasion or rash.  Neurological:     Mental Status: He  is alert and oriented to person, place, and time.     GCS: GCS eye subscore is 4. GCS verbal subscore is 5. GCS motor subscore is 6.     Cranial Nerves: No cranial nerve deficit.     Sensory: No sensory deficit.     Deep Tendon Reflexes: Strength normal.  Psychiatric:        Attention and Perception: He is inattentive.        Mood and Affect: Mood and affect normal. Mood is elated.        Speech: Speech is tangential.        Behavior: Behavior is aggressive and hyperactive.        Judgment: Judgment is impulsive.     ED Results / Procedures / Treatments   Labs (all labs ordered are listed, but only abnormal results are displayed) Labs Reviewed  RESP PANEL BY RT-PCR (FLU A&B, COVID) ARPGX2  COMPREHENSIVE METABOLIC PANEL  ETHANOL  CBC WITH DIFFERENTIAL/PLATELET  RAPID URINE DRUG SCREEN, HOSP PERFORMED    EKG None  Radiology No results found.  Procedures Procedures (including critical care time)  Medications Ordered in ED Medications  ziprasidone (GEODON) injection 10 mg (has no administration in time range)    ED Course   I have reviewed the triage vital signs and the nursing notes.  Pertinent labs & imaging results that were available during my care of the patient were reviewed by me and considered in my medical decision making (see chart for details).    MDM Rules/Calculators/A&P                         Patient given Geodon and is resting currently at this time. Patient has been medically clear for psychiatric disposition Final Clinical Impression(s) / ED Diagnoses Final diagnoses:  None    Rx / DC Orders ED Discharge Orders    None       Lorre Nick, MD 02/09/20 1246

## 2020-02-09 NOTE — ED Notes (Signed)
Immediately after having received the injection [facility]), he requests a drink, which I give him. He thence removes the straw and lid and drinks without issue. He has no other requests at this time.

## 2020-02-09 NOTE — ED Notes (Signed)
He becomes very agitated, as if he is having hallucinations. Security keeps him safe while we procure medication from pharmacy.

## 2020-02-09 NOTE — ED Triage Notes (Signed)
EMS [place]. EMS stated pt stated wants to be check for psych evaluation.

## 2020-02-09 NOTE — ED Notes (Signed)
He has just ambulated to b.r. and back without problem. His sitter is with him at all times. A cold drink and warm blanket were given.

## 2020-02-10 LAB — RAPID URINE DRUG SCREEN, HOSP PERFORMED
Amphetamines: POSITIVE — AB
Barbiturates: NOT DETECTED
Benzodiazepines: NOT DETECTED
Cocaine: POSITIVE — AB
Opiates: NOT DETECTED
Tetrahydrocannabinol: NOT DETECTED

## 2020-02-10 NOTE — ED Provider Notes (Signed)
Emergency Medicine Observation Re-evaluation Note  Brett Salinas is a 50 y.o. male, seen on rounds today.  Pt initially presented to the ED for complaints of Mental Health Problem Currently, the patient is resting comfortably.  Physical Exam  BP 131/80 (BP Location: Left Arm)   Pulse 77   Temp 98.6 F (37 C) (Oral)   Resp 15   SpO2 100%  Physical Exam General: Calm Cardiac: Normal heart rate Lungs: No respiratory distress Psych: Cooperative  ED Course / MDM  EKG:    I have reviewed the labs performed to date as well as medications administered while in observation.  Recent changes in the last 24 hours include improved status since initial sedation medication given about 24 hours ago.  Patient had a positive COVID test, 02/04/2020.  We will repeat testing, with, COVID antigen testing today.  Plan  Current plan is for TTS consultation. Patient is not under full IVC at this time.   Mancel Bale, MD 02/10/20 [phone]

## 2020-02-11 ENCOUNTER — Encounter (HOSPITAL_COMMUNITY): Payer: Self-pay | Admitting: Registered Nurse

## 2020-02-11 DIAGNOSIS — F19959 Other psychoactive substance use, unspecified with psychoactive substance-induced psychotic disorder, unspecified: Secondary | ICD-10-CM | POA: Diagnosis present

## 2020-02-11 DIAGNOSIS — I5033 Acute on chronic diastolic (congestive) heart failure: Secondary | ICD-10-CM | POA: Insufficient documentation

## 2020-02-11 NOTE — BH Assessment (Signed)
Per Melbourne Abts, PA pt is to remain in hospital for overnight observation with psychiatric provider reassessment in AM.  Weyman Pedro, MSW, LCSW Outpatient Therapist/Triage Specialist

## 2020-02-11 NOTE — ED Provider Notes (Signed)
Emergency Medicine Observation Re-evaluation Note  Brett Salinas is a 50 y.o. male, seen on rounds today.  Pt initially presented to the ED for complaints of Mental Health Problem, Addiction Problem, and Hallucinations   Physical Exam  BP 117/79   Pulse 72   Temp 98.6 F (37 C) (Oral)   Resp 16   SpO2 98%  Physical Exam General: Calm and cooperative.  Cardiac: Well perfused.  Lungs: Even, unlabored respirations.  Psych: Calm and cooperative.   ED Course / MDM  EKG:    I have reviewed the labs performed to date as well as medications administered while in observation.  Recent changes in the last 24 hours include BH evaluation.  Plan  Current plan is for Patient evaluated by [facility] and cleared for D/C. PEER support seen in the ED and resources provided. IVC rescinded by Dr. Lucianne Muss.  Patient is not under full IVC at this time.   Maia Plan, MD 02/13/20 [phone]

## 2020-02-11 NOTE — Discharge Instructions (Signed)
For your behavioral health needs, you are advised to follow up with [facility]:       [facility]      [address]      [place], [place] [address]      [phone]      Ask about their Substance Abuse Intensive Outpatient Program.  They also offer psychiatry/medication management and therapy.  New patients are being seen in their walk-in clinic.  Walk-in hours are Monday - Thursday from 8:00 am - 11:00 am for psychiatry, and Friday from 1:00 pm - 4:00 pm for therapy.  Walk-in patients are seen on a first come, first served basis, so try to arrive as early as possible for the best chance of being seen the same day.

## 2020-02-11 NOTE — BH Assessment (Signed)
Clinician called the Tele-Assessment machine in an effort to complete pt's BH Assessment. Clinician called the machine at [phone], [phone], and [phone] but there was no answer. TTS will attempt BH Assessment at a later time.

## 2020-02-11 NOTE — BH Assessment (Signed)
[facility] Assessment Progress Note  Per Shuvon Rankin, NP, this pt does not require psychiatric hospitalization at this time.  Pt presents under IVC initiated by EDP Lorre Nick, MD, which has been rescinded by Nelly Rout, MD.  Pt is psychiatrically cleared.  Discharge instructions include referral information for  [facility].  Pt would also benefit from seeing a Peer Support Specialist, and a Peer Support consult has been ordered.  Shuvon notes that pt will be difficult to reach in the community, and she recommends that Peer Support see him at [facility] prior to pt being discharged.  EDP Alona Bene, MD and charge nurse Kennyth Arnold, have been notified.  Doylene Canning, MA Triage Specialist [phone]

## 2020-02-11 NOTE — Consult Note (Signed)
  Psychiatric consult  Patient location:  [facility] ED Provider location: [facility]   Brett Salinas Brett Salinas, 50 y.o., male patient seen via tele psych by this provider, consulted with Dr. Lucianne Muss; and chart reviewed on 02/11/20.  On evaluation Brett Salinas reports he is feeling better today.  Patient reports that auditory and visual hallucinations have been very intense over the last 2 to 3 days but they are much calmer today.  Patient states he has a history of paranoid schizophrenia.  Patient also reports that he is homeless and just got out of jail about a week ago.  Patient states he is looking for assistance in getting into a rehab facility or a halfway house.  "I need to be somewhere where I can stay clean and stay on the medications."  Patient reported that he currently does not have outpatient psychiatric provider states he has a history of being in at [facility] but its been over a year.  Patient asked about Abilify long-acting injection and he was unable to state when the last was given or if 1 has been given since he has been in the [facility]. During evaluation Brett Salinas is sitting up in bed in no acute distress.   He is alert, oriented x 4, calm and cooperative.  His mood is anxious with congruent affect.  He does not appear to be responding to internal/external stimuli or delusional thoughts.  Patient denies suicidal/self-harm/homicidal ideation, psychosis, and paranoia.  Patient answered question appropriately.    Peers support consult ordered to assist patient with substance use services and behavioral health coordinator will assist with setting patient up for outpatient psychiatric services.  Disposition: Psychiatrically cleared No evidence of imminent risk to self or others at present.   Patient does not meet criteria for psychiatric inpatient admission. Supportive therapy provided about ongoing stressors. Refer to IOP. Discussed crisis plan, support from social network,  calling 911, coming to the Emergency Department, and calling Suicide Hotline.   Brett Ouk B. Maisyn Nouri, NP  Secure message sent to Dr. Jacqulyn Bath informing of patient's disposition.

## 2020-02-11 NOTE — Patient Outreach (Signed)
ED Peer Support Specialist Patient Intake (Complete at intake & 30-60 Day Follow-up)  Name: Brett Salinas  MRN: [id number]  Age: 50 y.o.   Date of Admission: 02/11/2020  Intake: Initial Comments:      Primary Reason Admitted: Mental Health  Addiction Problem  Lab values: Alcohol/ETOH: Negative Positive UDS? Yes Amphetamines: Yes Barbiturates: No Benzodiazepines: No Cocaine: Yes Opiates: No Cannabinoids: No  Demographic information: Gender: Male Ethnicity: African American Marital Status: Single Insurance Status: Uninsured/Self-pay Ecologist (Work Neurosurgeon, Physicist, medical, etc.: No Lives with: Alone Living situation: House/Apartment  Reported Patient History: Patient reported health conditions: None Patient aware of HIV and hepatitis status: No  In past year, has patient visited ED for any reason? No  Number of ED visits:    Reason(s) for visit:    In past year, has patient been hospitalized for any reason? No  Number of hospitalizations:    Reason(s) for hospitalization:    In past year, has patient been arrested? Yes  Number of arrests:    Reason(s) for arrest:    In past year, has patient been incarcerated? No  Number of incarcerations:    Reason(s) for incarceration:    In past year, has patient received medication-assisted treatment? No  In past year, patient received the following treatments: Other (comment)  In past year, has patient received any harm reduction services? No  Did this include any of the following?    In past year, has patient received care from a mental health provider for diagnosis other than SUD? No  In past year, is this first time patient has overdosed? No  Number of past overdoses:    In past year, is this first time patient has been hospitalized for an overdose? No  Number of hospitalizations for overdose(s):    Is patient currently receiving treatment for a mental health  diagnosis? Yes  Patient reports experiencing difficulty participating in SUD treatment: No    Most important reason(s) for this difficulty?    Has patient received prior services for treatment? No  In past, patient has received services from following agencies:    Plan of Care:  Suggested follow up at these agencies/treatment centers: Day Treatment Program  Other information: CPSS met with Pt an was able to gain information to better the quality of his life. CPSS talked with Pt about the concerns an issues that he is dealing with. CPSS addressed the fact that Pt wants treatment at this time. CPSS mentioned to Pt about [facility]. CPSS was able to contact intake specialist for a Ph assessment. CPSS are waiting for reply from Facility.     Aaron Edelman Kemari Mares, CPSS  02/11/2020 2:33 PM
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
## 2020-02-11 NOTE — Patient Outreach (Signed)
CPSS received call from [facility] an was made aware that Pt will not be able to attend facility due to health concerns. CPSS was made aware to contact [facility] facility in a few days to reevaluate situation.

## 2020-05-29 ENCOUNTER — Other Ambulatory Visit: Payer: Self-pay

## 2020-05-29 ENCOUNTER — Encounter (HOSPITAL_COMMUNITY): Payer: Self-pay | Admitting: Emergency Medicine

## 2020-05-29 ENCOUNTER — Emergency Department (HOSPITAL_COMMUNITY)
Admission: EM | Admit: 2020-05-29 | Discharge: 2020-06-01 | Disposition: A | Discharge status: Home / Self Care | Payer: Self-pay | Attending: Emergency Medicine | Admitting: Emergency Medicine

## 2020-05-29 DIAGNOSIS — I2510 Atherosclerotic heart disease of native coronary artery without angina pectoris: Secondary | ICD-10-CM | POA: Insufficient documentation

## 2020-05-29 DIAGNOSIS — R45851 Suicidal ideations: Secondary | ICD-10-CM | POA: Insufficient documentation

## 2020-05-29 DIAGNOSIS — H11439 Conjunctival hyperemia, unspecified eye: Secondary | ICD-10-CM | POA: Insufficient documentation

## 2020-05-29 DIAGNOSIS — I110 Hypertensive heart disease with heart failure: Secondary | ICD-10-CM | POA: Insufficient documentation

## 2020-05-29 DIAGNOSIS — J449 Chronic obstructive pulmonary disease, unspecified: Secondary | ICD-10-CM | POA: Insufficient documentation

## 2020-05-29 DIAGNOSIS — Z046 Encounter for general psychiatric examination, requested by authority: Secondary | ICD-10-CM | POA: Insufficient documentation

## 2020-05-29 DIAGNOSIS — I5033 Acute on chronic diastolic (congestive) heart failure: Secondary | ICD-10-CM | POA: Insufficient documentation

## 2020-05-29 DIAGNOSIS — F259 Schizoaffective disorder, unspecified: Secondary | ICD-10-CM | POA: Insufficient documentation

## 2020-05-29 DIAGNOSIS — Z79899 Other long term (current) drug therapy: Secondary | ICD-10-CM | POA: Insufficient documentation

## 2020-05-29 NOTE — ED Triage Notes (Signed)
The patient was found agitated on the side of the road. Visual hallucination and paranoia where present with EMS. %mg of Haldol was administered by EMS.   lHX schizophrenia   EMS vitals: 146/82 BP 93 HR 98% SPO2 on room air 134 CBG

## 2020-05-29 NOTE — ED Notes (Signed)
Patient belongings placed in locker 36.

## 2020-05-29 NOTE — ED Provider Notes (Signed)
Brett Salinas Provider Note   CSN: [id number] Arrival date & time: 05/29/20  1936     History Chief Complaint  Patient presents with  . Hallucinations    Brett Salinas is a 50 y.o. male with a hx of schizoaffective disorder, polysubstance abuse, CHF, CAD, hypertension, hypercholesterolemia, and COPD who presents to the emergency department via EMS for evaluation after being found agitated, paranoid, & responding to internal stimuli on the side of the road. EMS gave 5 mg of Haldol en route. Currently patient is sleeping, awakens to painful stimuli, does not answer many questions, can tell me his name, then goes back to sleep. Level 5 caveat applies secondary to psychiatric condition and sedating medications prior to arrival.   HPI     Past Medical History:  Diagnosis Date  . Achilles tendon rupture-surgery 09/19/15 09/19/2015  . Altered mental status 12/23/2016  . Arthritis    "hips, knees, back" (10/13/2015)  . Bacterial conjunctivitis of right eye 2018  . Bipolar disorder (HCC)   . CAD -S/P PCI LAD/DES 10/13/15 10/14/2015  . CHF (congestive heart failure) (HCC)   . Cocaine abuse-drug sceen positive, History of 10/14/2015  . Coronary artery disease   . Depression   . DVT (deep venous thrombosis) (HCC) 09/2015   right  . Essential hypertension 11/30/2015  . Heart murmur   . High cholesterol   . Hypertension   . Myocardial infarction (HCC) 10/12/2015 X 2-3  . NSTEMI (non-ST elevated myocardial infarction) (HCC)   . Osteoarthritis   . Rectal myiasis   . Schizoaffective disorder, bipolar type (HCC) 10/14/2015  . Schizoaffective disorder, bipolar type (HCC)   . Sciatica     Patient Active Problem List   Diagnosis Date Noted  . Acute on chronic diastolic CHF (congestive heart failure), NYHA class 3 (HCC) 02/11/2020  . 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  . 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 Homelessness 07/02/2019  . Hallucinations   . Polysubstance abuse (HCC)   . AKI (acute kidney injury) (HCC) 09/25/2018  . Tobacco use disorder 12/29/2016  . COPD (chronic obstructive pulmonary disease) (HCC) 12/28/2016  . Dyslipidemia 12/28/2016  . 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 Drug abuse (HCC)   . Agitation 12/24/2016  . Aggression 12/24/2016  . Altered mental status 12/23/2016  . History of ED visit for Medical clearance for incarceration for alleged aggressive behavior and crack cocaine use 09/05/2016  . Osteoarthritis 07/20/2016  . Essential hypertension 11/30/2015  . CAD -S/P PCI LAD/DES 10/13/15 10/14/2015  . Wound infection- for I&D achilles wound 10/14/15 10/14/2015  . Cocaine abuse-drug sceen positive, History of 10/14/2015  . Schizoaffective disorder, bipolar type (HCC) 10/14/2015  . Infection 10/14/2015  . NSTEMI (non-ST elevated myocardial infarction) (HCC)   . Chest pain 10/12/2015  . Achilles tendon rupture-surgery 09/19/15 09/19/2015    Past Surgical History:  Procedure Laterality Date  . CARDIAC CATHETERIZATION N/A 10/13/2015   Procedure: Left Heart Cath and Coronary Angiography;  Surgeon: Corky Crafts, MD;  Location[facility] INVASIVE CV LAB;  Service: Cardiovascular;  Laterality: N/A;  . CARDIAC CATHETERIZATION N/A 10/13/2015   Procedure: Coronary Stent Intervention;  Surgeon: Corky Crafts, MD;  Location: [facility] INVASIVE CV LAB;  Service: Cardiovascular;  Laterality: N/A;  . CARDIAC CATHETERIZATION N/A 10/13/2015   Procedure: Intravascular Ultrasound/IVUS;  Surgeon: Corky Crafts, MD;  Location: [facility] INVASIVE CV LAB;  Service: Cardiovascular;  Laterality: N/A;  . CORONARY ANGIOPLASTY WITH STENT  PLACEMENT  10/13/2015  . I & D EXTREMITY Right 09/19/2015   Procedure: IRRIGATION AND DEBRIDEMENT ANKLE LACERATIONS POSSIBLE TENDON REPAIR;  Surgeon: Cammy Copa, MD;  Location: [facility] OR;  Service: Orthopedics;  Laterality: Right;   . I & D EXTREMITY Right 10/14/2015   Procedure: IRRIGATION AND DEBRIDEMENT EXTREMITY/Right Ankle;  Surgeon: Cammy Copa, MD;  Location: [facility] OR;  Service: Orthopedics;  Laterality: Right;       History reviewed. No pertinent family history.  Social History   Tobacco Use  . Smoking status: Never Smoker  . Smokeless tobacco: Never Used  Substance Use Topics  . Alcohol use: Yes  . Drug use: Yes    Types: Cocaine, Marijuana    Comment: 10/13/2015 "I've tried alot; I don't have any habits"    Home Medications Prior to Admission medications   Medication Sig Start Date End Date Taking? Authorizing Provider  ARIPiprazole ER (ABILIFY MAINTENA) 400 MG SRER injection Inject 2 mLs (400 mg total) into the muscle every 28 (twenty-eight) days. Due 5/1 Patient not taking: Reported on 02/11/2020 05/06/19   Brett Johns, MD  atorvastatin (LIPITOR) 40 MG tablet Take 1 tablet (40 mg total) by mouth daily. Patient not taking: No sig reported 03/11/19   Lars Masson, MD  diclofenac Sodium (VOLTAREN) 1 % GEL Apply 4 g topically 4 (four) times daily. Patient not taking: Reported on 02/11/2020 03/17/19   Brett Sprout, MD  gabapentin (NEURONTIN) 300 MG capsule Take 1 capsule (300 mg total) by mouth 3 (three) times daily. Patient not taking: Reported on 02/11/2020 05/06/19   Brett Johns, MD  losartan (COZAAR) 25 MG tablet Take 1 tablet (25 mg total) by mouth daily. Patient not taking: No sig reported 03/11/19   Lars Masson, MD  losartan (COZAAR) 50 MG tablet Take 1 tablet by mouth at bedtime. 03/12/20   [provider]  meloxicam (MOBIC) 15 MG tablet Take 1 tablet by mouth daily. 03/12/20   [provider]  OLANZapine zydis (ZYPREXA) 20 MG disintegrating tablet Take 1 tablet (20 mg total) by mouth at bedtime. Patient not taking: Reported on 02/11/2020 05/06/19   Brett Johns, MD    Allergies    Brett Salinas allergy], Heparin, and Pork-derived products  Review of Systems    Review of Systems  Unable to perform ROS: Psychiatric disorder    Physical Exam Updated Vital Signs BP 105/77   Pulse 86   Temp 97.8 F (36.6 C) (Oral)   Resp 18   SpO2 94%   Physical Exam Vitals and nursing note reviewed.  Constitutional:      Comments: Patient sleeping, awakens to painful stimuli and will move his extremities.   HENT:     Head: Normocephalic and atraumatic.     Comments: No racoon eyes or battle sign.  Eyes:     Pupils: Pupils are equal, round, and reactive to light.     Comments: Mild conjunctival injection noted.   Cardiovascular:     Rate and Rhythm: Normal rate and regular rhythm.  Pulmonary:     Effort: Pulmonary effort is normal. No respiratory distress.     Breath sounds: Normal breath sounds.     Comments: Respirations are even & unlabored.  Chest:     Chest wall: No tenderness.  Abdominal:     Palpations: Abdomen is soft.     Tenderness: There is no abdominal tenderness. There is no guarding or rebound.  Skin:    General: Skin is warm and  dry.  Neurological:     Comments: Sleeping, moves extremities when awakened. Protecting his airway without difficulty.   Psychiatric:     Comments: Difficult to assess secondary to patient being somewhat somnolent.      ED Results / Procedures / Treatments   Labs (all labs ordered are listed, but only abnormal results are displayed) Labs Reviewed  RESP PANEL BY RT-PCR (FLU A&B, COVID) ARPGX2  ACETAMINOPHEN LEVEL  CBC  COMPREHENSIVE METABOLIC PANEL  ETHANOL  RAPID URINE DRUG SCREEN, HOSP PERFORMED  SALICYLATE LEVEL    EKG None  Radiology No results found.  Procedures Procedures   Medications Ordered in ED Medications - No data to display  ED Course  I have reviewed the triage vital signs and the nursing notes.  Pertinent labs & imaging results that were available during my care of the patient were reviewed by me and considered in my medical decision making (see chart for  details).    MDM Rules/Calculators/A&P                         Patient presents to the ED via EMS for evaluation after being found on the side of the road with signs of paranoia, responding to internal stimuli, and agitation. S/p haldol. Somnolent, protecting airway, limited evaluation on initial assessment. Will check labs, no signs of head trauma and is able to move all extremities. Plan for re-assessment.   Additional history obtained:  Additional history obtained from chart review & nursing note review.   Lab Tests:  Screening labs ordered.   ED Course:  When attempted to get blood work patient became verbally aggressive with staff.  He is being uncooperative overall.  On my reassessment patient is now arousable to verbal stimuli.  He is yelling and swearing at me.  He states he just wants to die, report suicidal ideation, verbally aggressive, will not answer questions.  Will place patient under IVC at this time, additional Haldol ordered.  Findings and plan of care discussed with supervising physician Dr. Bebe Shaggy who is in agreement.   AM team aware of pending labs @ change of shift.  TTS consult placed.   Portions of this note were generated with Scientist, clinical (histocompatibility and immunogenetics). Dictation errors may occur despite best attempts at proofreading.  Final Clinical Impression(s) / ED Diagnoses Final diagnoses:  Suicidal ideation    Rx / DC Orders ED Discharge Orders    None       Cherly Anderson, PA-C 05/30/20 0654    Zadie Rhine, MD 05/30/20 [phone]

## 2020-05-30 LAB — COMPREHENSIVE METABOLIC PANEL
ALT: 15 U/L (ref 0–44)
AST: 30 U/L (ref 15–41)
Albumin: 3.6 g/dL (ref 3.5–5.0)
Alkaline Phosphatase: 61 U/L (ref 38–126)
Anion gap: 5 (ref 5–15)
BUN: 19 mg/dL (ref 6–20)
CO2: 27 mmol/L (ref 22–32)
Calcium: 8.8 mg/dL — ABNORMAL LOW (ref 8.9–10.3)
Chloride: 108 mmol/L (ref 98–111)
Creatinine, Ser: 1.25 mg/dL — ABNORMAL HIGH (ref 0.61–1.24)
GFR, Estimated: >60 mL/min (ref >60)
Glucose, Bld: 86 mg/dL (ref 70–99)
Potassium: 3.7 mmol/L (ref 3.5–5.1)
Sodium: 140 mmol/L (ref 135–145)
Total Bilirubin: 1.5 mg/dL — ABNORMAL HIGH (ref 0.3–1.2)
Total Protein: 6.4 g/dL — ABNORMAL LOW (ref 6.5–8.1)

## 2020-05-30 LAB — SALICYLATE LEVEL: Salicylate Lvl: <7 mg/dL — ABNORMAL LOW (ref 7.0–30.0)

## 2020-05-30 LAB — CBC
HCT: 38 % — ABNORMAL LOW (ref 39.0–52.0)
Hemoglobin: 12.5 g/dL — ABNORMAL LOW (ref 13.0–17.0)
MCH: 28.9 pg (ref 26.0–34.0)
MCHC: 32.9 g/dL (ref 30.0–36.0)
MCV: 88 fL (ref 80.0–100.0)
Platelets: 200 10*3/uL (ref 150–400)
RBC: 4.32 MIL/uL (ref 4.22–5.81)
RDW: 13.2 % (ref 11.5–15.5)
WBC: 3.6 10*3/uL — ABNORMAL LOW (ref 4.0–10.5)
nRBC: 0 % (ref 0.0–0.2)

## 2020-05-30 LAB — ACETAMINOPHEN LEVEL: Acetaminophen (Tylenol), Serum: <10 ug/mL — ABNORMAL LOW (ref 10–30)

## 2020-05-30 LAB — ETHANOL: Alcohol, Ethyl (B): <10 mg/dL (ref <10)

## 2020-05-30 MED ORDER — ARIPIPRAZOLE 5 MG PO TABS: 5.0000 mg | ORAL_TABLET | Freq: Once | ORAL | Status: DC

## 2020-05-30 MED ORDER — DIPHENHYDRAMINE HCL 50 MG/ML IJ SOLN: 50.0000 mg | Freq: Two times a day (BID) | INTRAMUSCULAR | Status: DC | PRN

## 2020-05-30 MED ORDER — ARIPIPRAZOLE ER 400 MG IM SRER
400.0000 mg | INTRAMUSCULAR | Status: DC
Start: 2020-05-31 — End: 2020-06-01
  Administered 2020-05-31: 400 mg via INTRAMUSCULAR
  Filled 2020-05-30: qty 2

## 2020-05-30 MED ORDER — HALOPERIDOL LACTATE 5 MG/ML IJ SOLN
5.0000 mg | Freq: Once | INTRAMUSCULAR | Status: AC
  Administered 2020-05-30: 5 mg via INTRAMUSCULAR
  Filled 2020-05-30: qty 1

## 2020-05-30 MED ORDER — HALOPERIDOL LACTATE 5 MG/ML IJ SOLN: 5.0000 mg | Freq: Two times a day (BID) | INTRAMUSCULAR | Status: DC | PRN

## 2020-05-30 MED ORDER — ARIPIPRAZOLE 5 MG PO TABS
5.0000 mg | ORAL_TABLET | Freq: Every day | ORAL | Status: DC
  Filled 2020-05-30: qty 1

## 2020-05-30 NOTE — ED Notes (Signed)
Charge RN spoke with patient to draw blood.  Patient refused.

## 2020-05-30 NOTE — ED Provider Notes (Signed)
Emergency Medicine Observation Re-evaluation Note  Brett Salinas is a 50 y.o. male, seen on rounds today.  Pt initially presented to the ED for complaints of Hallucinations Currently, the patient is sleeping comfortably.  Physical Exam  BP 107/84 (BP Location: Right Arm)   Pulse 68   Temp 97.9 F (36.6 C) (Axillary)   Resp 16   SpO2 98%  Physical Exam General: NAD   ED Course / MDM  EKG:   I have reviewed the labs performed to date as well as medications administered while in observation.  Recent changes in the last 24 hours include none reported by staff.  Plan  Current plan is for [facility] evaluation. Patient is under full IVC at this time.   Wynetta Fines, MD 05/30/20 [phone]

## 2020-05-30 NOTE — BH Assessment (Signed)
Comprehensive Clinical Assessment (CCA) Note  05/30/2020 Brett Salinas [facility] [id number] Disposition: Arvilla Market NP recommends a inpatient admission as placement is investigated.   Flowsheet Row ED from 02/09/2020 in [facility] from 07/01/2019 in [facility] from 06/17/2019 in [facility] EMERGENCY DEPARTMENT  C-SSRS RISK CATEGORY No Risk Error: Q3, 4, or 5 should not be populated when Q2 is No High Risk     The patient demonstrates the following risk factors for suicide: Chronic risk factors for suicide include: psychiatric disorder of Schizoaffective . Acute risk factors for suicide include: N/A. Protective factors for this patient include: UTA. Considering these factors, the overall suicide risk at this point appears to be moderate. Patient is not appropriate for outpatient follow up.  Brett Salinas is a 50 yo male transported to [facility] for assessment of hallucinations. Patient was found [place] the side of the road off [address] experiencing active AVH. IVC was initiated on arrival by EDP.  Patient has a history of Schizoaffective disorder and polysubstance abuse. Patient reports active S/I this date although is vague in reference to plan or intent. Patient renders limited history and states when questioned in reference to S/I, "The world wants me dead and I can do it." Patient says "No not me" when asked in reference to H/I or AVH. It is unclear if patient is processing the content of this writer's questions. Patient is observed to be covering his head up while in his bed and will not respond to any more of this writer's questions. Information to complete assessment was obtained from admission notes and chart history. Per chart patient has presented to area ED's for management of psychosis and was last seen on multiple occasions and was last seen on 02/09/20 when he presented with similar symptoms. As mentioned patient has a  history of SA use with UDS pending this date. Patient has limited support in the area and resides in local hotels with a history of being homeless. Per history patient has been prescribed multiple medications for management of psychiatric symptoms although it is unclear if patient is currently under the care of a OP provider at this time.   PA Petrucelli writes on arrival: Brett Salinas is a 50 y.o. male with a hx of schizoaffective disorder, polysubstance abuse, CHF, CAD, hypertension, hypercholesterolemia, and COPD who presents to the emergency department via EMS for evaluation after being found agitated, paranoid, & responding to internal stimuli on the side of the road. EMS gave 5 mg of Haldol en route. Currently patient is sleeping, awakens to painful stimuli, does not answer many questions, can tell me his name, then goes back to sleep. Level 5 caveat applies secondary to psychiatric condition and sedating medications prior to arrival.   Patient will not respond to orientation questions and is observed to be agitated at the time of assessment. Patient's memory is impaired and thoughts disorganized. It is unclear if patient is responding to internal stimuli at the time of assessment.   Chief Complaint:  Chief Complaint  Patient presents with  . Hallucinations   Visit Diagnosis: Schizoaffective Disorder     CCA Screening, Triage and Referral (STR)  Patient Reported Information How did you hear about [place]? Self  Referral name: EMS transported pt to hospital after pt was experiencing hallucinations at [facility] gas station  Referral phone number: No data recorded  Whom do you see for routine medical problems? I don't have a doctor  Practice/Facility Name:  No data recorded Practice/Facility Phone Number: No data recorded Name of Contact: No data recorded Contact Number: No data recorded Contact Fax Number: No data recorded Prescriber Name: No data recorded Prescriber Address (if  known): No data recorded  What Is the Reason for Your Visit/Call Today? Pt presents with IVC as patient displyed active psychosis on arrival.  How Long Has This Been Causing You Problems? <Week  What Do You Feel Would Help You the Most Today? -- (UTA)   Have You Recently Been in Any Inpatient Treatment (Hospital/Detox/Crisis Center/28-Day Program)? No  Name/Location of Program/Hospital:No data recorded How Long Were You There? No data recorded When Were You Discharged? No data recorded  Have You Ever Received Services From [facility] Before? Yes  Who Do You See at [facility]? Pt has been assessed before by TTS when he presented with similar symptoms.   Have You Recently Had Any Thoughts About Hurting Yourself? Yes  Are You Planning to Commit Suicide/Harm Yourself At This time? No   Have you Recently Had Thoughts About Hurting Someone Karolee Ohs? No  Explanation: No data recorded  Have You Used Any Alcohol or Drugs in the Past 24 Hours? No  How Long Ago Did You Use Drugs or Alcohol? No data recorded What Did You Use and How Much? No data recorded  Do You Currently Have a Therapist/Psychiatrist? No  Name of Therapist/Psychiatrist: No data recorded  Have You Been Recently Discharged From Any Office Practice or Programs? No  Explanation of Discharge From Practice/Program: No data recorded    CCA Screening Triage Referral Assessment Type of Contact: Face-to-Face  Is this Initial or Reassessment? Initial Assessment  Date Telepsych consult ordered in [facility]:  02/09/2020  Time Telepsych consult ordered in [facility]:  0410   Patient Reported Information Reviewed? Yes  Patient Left Without Being Seen? No data recorded Reason for Not Completing Assessment: Pt continues to be too sleepy to complete his assessment at this time   Collateral Involvement: none--pt states that he has no family closeby and that his girlfriend just recently left him   Does Patient Have a Dealer Guardian? No data recorded Name and Contact of Legal Guardian: Self.   If Minor and Not Living with Parent(s), Who has Custody? -- (no)  Is CPS involved or ever been involved? Never  Is APS involved or ever been involved? Never   Patient Determined To Be At Risk for Harm To Self or Others Based on Review of Patient Reported Information or Presenting Complaint? Yes, for Self-Harm  Method: No data recorded Availability of Means: No data recorded Intent: No data recorded Notification Required: No data recorded Additional Information for Danger to Others Potential: No data recorded Additional Comments for Danger to Others Potential: No data recorded Are There Guns or Other Weapons in Your Home? No data recorded Types of Guns/Weapons: No data recorded Are These Weapons Safely Secured?                            No data recorded Who Could Verify You Are Able To Have These Secured: No data recorded Do You Have any Outstanding Charges, Pending Court Dates, Parole/Probation? No data recorded Contacted To Inform of Risk of Harm To Self or Others: No data recorded  Location of Assessment: [facility] ED   Does Patient Present under Involuntary Commitment? Yes  IVC Papers Initial File Date: 05/30/2020   [place] of Residence: [place]   Patient  Currently Receiving the Following Services: Medication Management   Determination of Need: Urgent (48 hours)   Options For Referral: Outpatient Therapy     CCA Biopsychosocial Intake/Chief Complaint:  Brett Salinas is a 50 yo male transported to [facility] for assessment of hallucinations.  Current Symptoms/Problems: pt has severe body aches/pains. experiencing AVH.   Patient Reported Schizophrenia/Schizoaffective Diagnosis in Past: Yes   Strengths: No data recorded Preferences: No data recorded Abilities: No data recorded  Type of Services Patient Feels are Needed: psychiatric support   Initial Clinical Notes/Concerns: No data  recorded  Mental Health Symptoms Depression:  Difficulty Concentrating; Hopelessness; Irritability; Sleep (too much or little)   Duration of Depressive symptoms: No data recorded  Mania:  Irritability; Racing thoughts   Anxiety:   Irritability; Fatigue   Psychosis:  Grossly disorganized speech; Hallucinations   Duration of Psychotic symptoms: Greater than six months   Trauma:  None   Obsessions:  None   Compulsions:  None   Inattention:  None   Hyperactivity/Impulsivity:  N/A   Oppositional/Defiant Behaviors:  Angry   Emotional Irregularity:  Mood lability   Other Mood/Personality Symptoms:  No data recorded   Mental Status Exam Appearance and self-care  Stature:  Tall   Weight:  Average weight   Clothing:  Casual   Grooming:  Normal   Cosmetic use:  None   Posture/gait:  Normal   Motor activity:  Not Remarkable   Sensorium  Attention:  Distractible   Concentration:  Scattered   Orientation:  X5   Recall/memory:  Normal   Affect and Mood  Affect:  Labile; Depressed; Flat   Mood:  Depressed; Irritable   Relating  Eye contact:  Fleeting   Facial expression:  Anxious   Attitude toward examiner:  Cooperative; Defensive   Thought and Language  Speech flow: Garbled; Pressured   Thought content:  Persecutions   Preoccupation:  None   Hallucinations:  Auditory; Visual   Organization:  No data recorded  [employer] of Knowledge:  Fair   Intelligence:  Average   Abstraction:  Functional   Judgement:  Dangerous   Reality Testing:  Variable   Insight:  Gaps   Decision Making:  Impulsive   Social Functioning  Social Maturity:  Impulsive   Social Judgement:  Heedless   Stress  Stressors:  Family conflict; Grief/losses; Relationship; Legal (pt has some court dates)   Coping Ability:  Deficient supports   Skill Deficits:  No data recorded  Supports:  Support needed     Religion:    Leisure/Recreation:     Exercise/Diet: Exercise/Diet Do You Exercise?: Yes Have You Gained or Lost A Significant Amount of Weight in the Past Six Months?: Yes-Gained Do You Have Any Trouble Sleeping?: Yes   CCA Employment/Education Employment/Work Situation: Employment / Work Situation Employment situation: Unemployed Patient's job has been impacted by current illness: Yes What is the longest time patient has a held a job?:  5 years 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 Does patient have children?: Yes  Childhood History:  Childhood History By whom was/is the patient raised?: Mother Additional childhood history information: Grew up in a broken home, parents divorced when he was 2-3 Description of patient's relationship with caregiver when they were a child: Hit and miss, she had zero tolerance, no flexibility, he was never home. Father was very spititual and kind, he was firm. How were you disciplined when you got in trouble as a child/adolescent?: Spankings, verbally scolded. Did patient suffer any verbal/emotional/physical/sexual abuse as a child?: No Has patient ever been sexually abused/assaulted/raped as an adolescent or adult?: No Witnessed domestic violence?: Yes Has patient been affected by domestic violence as an adult?: No  Child/Adolescent Assessment:     CCA Substance Use Alcohol/Drug Use: Alcohol / Drug Use Pain Medications: See MAR Prescriptions: See MAR Over the Counter: See MAR History of alcohol / drug use?:  Yes Longest period of sobriety (when/how long): UNKNOWN                         ASAM's:  Six Dimensions of Multidimensional Assessment  Dimension 1:  Acute Intoxication and/or Withdrawal Potential:   Dimension 1:  Description of individual's past and current experiences of substance use and withdrawal: etoh, cocaine, methamphetamine,  Dimension 2:  Biomedical Conditions and Complications:      Dimension 3:  Emotional, Behavioral, or Cognitive Conditions and Complications:     Dimension 4:  Readiness to Change:     Dimension 5:  Relapse, Continued use, or Continued Problem Potential:     Dimension 6:  Recovery/Living Environment:     ASAM Severity Score: ASAM's Severity Rating Score: 6  ASAM Recommended Level of Treatment:     Substance use Disorder (SUD)    Recommendations for Services/Supports/Treatments: Recommendations for Services/Supports/Treatments Recommendations For Services/Supports/Treatments: Medication Management,Individual Therapy (overnight observation with psychiatric provider reassessment in AM)  DSM5 Diagnoses: Patient Active Problem List   Diagnosis Date Noted  . Acute on chronic diastolic CHF (congestive heart failure), NYHA class 3 (HCC) 02/11/2020  . 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  . 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 Homelessness 07/02/2019  . Hallucinations   . Polysubstance abuse (HCC)   . AKI (acute kidney injury) (HCC) 09/25/2018  . Tobacco use disorder 12/29/2016  . COPD (chronic obstructive pulmonary disease) (HCC) 12/28/2016  . Dyslipidemia 12/28/2016  . 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 Drug abuse (HCC)   . Agitation 12/24/2016  . Aggression 12/24/2016  . Altered mental status 12/23/2016  . History of ED visit for Medical clearance for incarceration for alleged aggressive behavior and crack cocaine use 09/05/2016  . Osteoarthritis 07/20/2016  . Essential  hypertension 11/30/2015  . CAD -S/P PCI LAD/DES 10/13/15 10/14/2015  . Wound infection- for I&D achilles wound 10/14/15 10/14/2015  . Cocaine abuse-drug sceen positive, History of 10/14/2015  . Schizoaffective disorder, bipolar type (HCC) 10/14/2015  . Infection 10/14/2015  . NSTEMI (non-ST elevated myocardial infarction) (HCC)   . Chest pain 10/12/2015  . Achilles tendon rupture-surgery 09/19/15 09/19/2015    Patient Centered Plan: Patient is on the following Treatment Plan(s):   Referrals to Alternative Service(s): Referred to Alternative Service(s):   Place:   Date:   Time:    Referred to Alternative Service(s):   Place:   Date:   Time:    Referred to Alternative Service(s):   Place:   Date:   Time:    Referred to Alternative Service(s):   Place:   Date:  Time:     Mamie Nick, LCAS

## 2020-05-30 NOTE — ED Notes (Signed)
Patient refused vitals.

## 2020-05-30 NOTE — ED Notes (Addendum)
Patient refused blood draw and vitals.

## 2020-05-30 NOTE — ED Notes (Signed)
Patient's belongings moved from locker 36 to locker 31.

## 2020-05-30 NOTE — Progress Notes (Signed)
Per Ophelia Shoulder, NP, patient meets criteria for inpatient treatment. There are no available or appropriate beds at [facility] today. CSW faxed referrals to the following facilities for review:  [facility] Donalda Ewings [facility] Orlie Pollen  TTS will continue to seek bed placement.  Crissie Reese, MSW, LCSW-A, LCAS-A Phone: [phone] Disposition/TOC

## 2020-05-30 NOTE — ED Notes (Signed)
Pt has been alert, resting in room, sitter at bedside. Pt is flat, dull, blunted, irritable.

## 2020-05-31 LAB — RAPID URINE DRUG SCREEN, HOSP PERFORMED
Amphetamines: POSITIVE — AB
Barbiturates: NOT DETECTED
Benzodiazepines: NOT DETECTED
Cocaine: POSITIVE — AB
Opiates: NOT DETECTED
Tetrahydrocannabinol: POSITIVE — AB

## 2020-06-01 DIAGNOSIS — R45851 Suicidal ideations: Secondary | ICD-10-CM

## 2020-06-01 NOTE — BH Assessment (Signed)
[facility] Assessment Progress Note  Per Nelly Rout, MD, this pt does not require psychiatric hospitalization at this time.  Pt presents under IVC initiated by EDP Zadie Rhine, MD which has been rescinded by Dr Lucianne Muss.  Pt is psychiatrically cleared.  Pt was discharged from [facility] before this writer could enter discharge instructions.  EDP Bethann Berkshire, MD and pt's nurse, Jonny Ruiz, have been notified.  Doylene Canning, MA Triage Specialist [phone]

## 2020-06-01 NOTE — Discharge Instructions (Addendum)
Follow-up as instructed by behavioral health 

## 2020-06-01 NOTE — Consult Note (Signed)
[facility]    Reason for Consult: Hallucinations Referring Physician: EDP Patient Identification: Brett Salinas MRN:  [id number] Principal Diagnosis: <principal problem not specified> Diagnosis:  Active Problems:   * No active hospital problems. *   Total Time spent with patient: 15 minutes  Subjective:   Brett Salinas is a 50 y.o. male was seen and evaluated face-to-face.  He is awake, alert and oriented x3.  Denying suicidal or homicidal ideations.  Does report chronic paranoia however declined medication management.  Reports hallucinations is worse with substance use.  Patient was offered additional outpatient resources.  Patient was receptive to plan.  Case staffed with attending psychiatrist Lucianne MussKumar and treatment team.  IVC to be rescinded.  Support, encouragement and  reassurance was provided.  HPI: Per admission assessment note: Brett Salinas is a 50 yo male [facility]. Patient was found [place] the side of the road off [address] experiencing active AVH. IVC was initiated on arrival by EDP.  Patient has a history of Schizoaffective disorder and polysubstance abuse. Patient reports active S/I this date although is vague in reference to plan or intent. Patient renders limited history and states when questioned in reference to S/I, "The world wants me dead and I can do it." Patient says "No not me" when asked in reference to H/I or AVH. It is unclear if patient is processing the content of this writer's questions. Past Psychiatric History:   Risk to Self:   Risk to Others:   Prior Inpatient Therapy:   Prior Outpatient Therapy:    Past Medical History:  Past Medical History:  Diagnosis Date  . Achilles tendon rupture-surgery 09/19/15 09/19/2015  . Altered mental status 12/23/2016  . Arthritis    "hips, knees, back" (10/13/2015)  . Bacterial conjunctivitis of right eye 2018  . Bipolar disorder (HCC)   . CAD  -S/P PCI LAD/DES 10/13/15 10/14/2015  . CHF (congestive heart failure) (HCC)   . Cocaine abuse-drug sceen positive, History of 10/14/2015  . Coronary artery disease   . Depression   . DVT (deep venous thrombosis) (HCC) 09/2015   right  . Essential hypertension 11/30/2015  . Heart murmur   . High cholesterol   . Hypertension   . Myocardial infarction (HCC) 10/12/2015 X 2-3  . NSTEMI (non-ST elevated myocardial infarction) (HCC)   . Osteoarthritis   . Rectal myiasis   . Schizoaffective disorder, bipolar type (HCC) 10/14/2015  . Schizoaffective disorder, bipolar type (HCC)   . Sciatica     Past Surgical History:  Procedure Laterality Date  . CARDIAC CATHETERIZATION N/A 10/13/2015   Procedure: Left Heart Cath and Coronary Angiography;  Surgeon: Corky CraftsJayadeep S Varanasi, MD;  [facility];  Service: Cardiovascular;  Laterality: N/A;  . CARDIAC CATHETERIZATION N/A 10/13/2015   Procedure: Coronary Stent Intervention;  Surgeon: Corky CraftsJayadeep S Varanasi, MD;  [facility];  Service: Cardiovascular;  Laterality: N/A;  . CARDIAC CATHETERIZATION N/A 10/13/2015   Procedure: Intravascular Ultrasound/IVUS;  Surgeon: Corky CraftsJayadeep S Varanasi, MD;  [facility];  Service: Cardiovascular;  Laterality: N/A;  . CORONARY ANGIOPLASTY WITH STENT PLACEMENT  10/13/2015  . I & D EXTREMITY Right 09/19/2015   Procedure: IRRIGATION AND DEBRIDEMENT ANKLE LACERATIONS POSSIBLE TENDON REPAIR;  Surgeon: Cammy CopaScott Gregory Dean, MD;  Location: [facility] OR;  Service: Orthopedics;  Laterality: Right;  . I & D EXTREMITY Right 10/14/2015   Procedure: IRRIGATION AND DEBRIDEMENT EXTREMITY/Right Ankle;  Surgeon: Cammy CopaScott Gregory Dean,  MD;  Location: [facility] OR;  Service: Orthopedics;  Laterality: Right;   Family History: History reviewed. No pertinent family history. Family Psychiatric  History:  Social History:  Social History   Substance and Sexual Activity  Alcohol Use Yes     Social History   Substance and  Sexual Activity  Drug Use Yes  . Types: Cocaine, Marijuana   Comment: 10/13/2015 "I've tried alot; I don't have any habits"    Social History   Socioeconomic History  . Marital status: Single    Spouse name: Not on file  . Number of children: Not on file  . Years of education: Not on file  . Highest education level: Not on file  Occupational History  . Not on file  Tobacco Use  . Smoking status: Never Smoker  . Smokeless tobacco: Never Used  Substance and Sexual Activity  . Alcohol use: Yes  . Drug use: Yes    Types: Cocaine, Marijuana    Comment: 10/13/2015 "I've tried alot; I don't have any habits"  . Sexual activity: Not on file  Other Topics Concern  . Not on file  Social History Narrative   ** Merged History Encounter **       Social Determinants of Health   Financial Resource Strain: Not on file  Food Insecurity: Not on file  Transportation Needs: Not on file  Physical Activity: Not on file  Stress: Not on file  Social Connections: Not on file   Additional Social History:    Allergies:   Allergies  Allergen Reactions  . 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 Heparin Itching    Full-body itching inc throat  . Pork-Derived Products Itching    Burning/itching    Labs:  Results for orders placed or performed during the hospital encounter of 05/29/20 (from the past 48 hour(s))  Rapid urine drug screen (hospital performed)     Status: Abnormal   Collection Time: 05/30/20  9:54 AM  Result Value Ref Range   Opiates NONE DETECTED NONE DETECTED   Cocaine POSITIVE (A) NONE DETECTED   Benzodiazepines NONE DETECTED NONE DETECTED   Amphetamines POSITIVE (A) NONE DETECTED   Tetrahydrocannabinol POSITIVE (A) NONE DETECTED   Barbiturates NONE DETECTED NONE DETECTED    Comment: (NOTE) DRUG SCREEN FOR MEDICAL PURPOSES ONLY.  IF CONFIRMATION IS NEEDED FOR ANY PURPOSE, NOTIFY LAB WITHIN 5 DAYS.  LOWEST DETECTABLE LIMITS FOR URINE DRUG SCREEN Drug Class                      Cutoff (ng/mL) Amphetamine and metabolites    1000 Barbiturate and metabolites    200 Benzodiazepine                 200 Tricyclics and metabolites     300 Opiates and metabolites        300 Cocaine and metabolites        300 THC                            50 Performed at [facility], [address], [place], [place] [address]     Current Facility-Administered Medications  Medication Dose Route Frequency Provider Last Rate Last Admin  . ARIPiprazole (ABILIFY) tablet 5 mg  5 mg Oral Daily Ophelia Shoulder E, NP      . ARIPiprazole ER (ABILIFY MAINTENA) injection 400 mg  400 mg Intramuscular Q28 days Chales Abrahams, NP  400 mg at 05/31/20 1207  . haloperidol lactate (HALDOL) injection 5 mg  5 mg Intramuscular BID PRN Chales Abrahams, NP       And  . diphenhydrAMINE (BENADRYL) injection 50 mg  50 mg Intramuscular BID PRN Chales Abrahams, NP       Current Outpatient Medications  Medication Sig Dispense Refill  . ARIPiprazole ER (ABILIFY MAINTENA) 400 MG SRER injection Inject 2 mLs (400 mg total) into the muscle every 28 (twenty-eight) days. Due 5/1 (Patient not taking: Reported on 02/11/2020) 1 each 11  . atorvastatin (LIPITOR) 40 MG tablet Take 1 tablet (40 mg total) by mouth daily. (Patient not taking: No sig reported) 90 tablet 3  . diclofenac Sodium (VOLTAREN) 1 % GEL Apply 4 g topically 4 (four) times daily. (Patient not taking: Reported on 02/11/2020) 100 g 0  . gabapentin (NEURONTIN) 300 MG capsule Take 1 capsule (300 mg total) by mouth 3 (three) times daily. (Patient not taking: Reported on 02/11/2020) 90 capsule 2  . losartan (COZAAR) 25 MG tablet Take 1 tablet (25 mg total) by mouth daily. (Patient not taking: No sig reported) 90 tablet 3  . losartan (COZAAR) 50 MG tablet Take 1 tablet by mouth at bedtime.    . meloxicam (MOBIC) 15 MG tablet Take 1 tablet by mouth daily.    Marland Kitchen OLANZapine zydis (ZYPREXA) 20 MG disintegrating tablet Take 1 tablet (20 mg total)  by mouth at bedtime. (Patient not taking: Reported on 02/11/2020) 90 tablet 2    Musculoskeletal: Strength & Muscle Tone: within normal limits Gait & Station: normal Patient leans: N/A            Psychiatric Specialty Exam:  Presentation  General Appearance: Appropriate for Environment  Eye Contact:Good  Speech:Clear and Coherent  Speech Volume:Normal  Handedness:Left   Mood and Affect  Mood:Anxious; Depressed  Affect:Congruent   Thought Process  Thought Processes:Goal Directed  Descriptions of Associations:Intact  Orientation:Full (Time, Place and Person)  Thought Content:Logical  History of Schizophrenia/Schizoaffective disorder:Yes  Duration of Psychotic Symptoms:Greater than six months  Hallucinations:Hallucinations: Auditory  Ideas of Reference:None  Suicidal Thoughts:Suicidal Thoughts: No  Homicidal Thoughts:Homicidal Thoughts: No   Sensorium  Memory:Immediate Good; Recent Good; Remote Good  Judgment:Fair  Insight:Fair   Executive Functions  Concentration:Fair  Attention Span:Fair  Recall:Fair  Fund of Knowledge:Good  Language:Good   Psychomotor Activity  Psychomotor Activity:Psychomotor Activity: Normal   Assets  Assets:Communication Skills   Sleep  Sleep:Sleep: Fair   Physical Exam: Physical Exam Vitals reviewed.  Cardiovascular:     Rate and Rhythm: Normal rate and regular rhythm.  Psychiatric:        Attention and Perception: Attention normal.        Mood and Affect: Mood normal.        Speech: Speech normal.        Behavior: Behavior normal.        Thought Content: Thought content normal.        Cognition and Memory: Cognition normal.        Judgment: Judgment normal.    Review of Systems  Psychiatric/Behavioral: Positive for depression and substance abuse. The patient is nervous/anxious.   All other systems reviewed and are negative.  Blood pressure 110/78, pulse 67, temperature 98.1 F (36.7  C), temperature source Oral, resp. rate 15, SpO2 94 %. There is no height or weight on file to calculate BMI.   Disposition: No evidence of imminent risk to self or others at present.  Patient does not meet criteria for psychiatric inpatient admission. Supportive therapy provided about ongoing stressors. Refer to IOP. Discussed crisis plan, support from social network, calling 911, coming to the Emergency Department, and calling Suicide Hotline.  Oneta Rack, NP 06/01/2020 9:48 AM

## 2020-07-24 ENCOUNTER — Ambulatory Visit
Admission: EM | Admit: 2020-07-24 | Discharge: 2020-07-24 | Disposition: A | Discharge status: Home / Self Care | Payer: Self-pay | Attending: Emergency Medicine | Admitting: Emergency Medicine

## 2020-07-24 ENCOUNTER — Emergency Department (HOSPITAL_COMMUNITY): Payer: Self-pay

## 2020-07-24 ENCOUNTER — Emergency Department (HOSPITAL_COMMUNITY)
Admission: EM | Admit: 2020-07-24 | Discharge: 2020-07-24 | Disposition: A | Discharge status: Home / Self Care | Payer: Self-pay | Attending: Emergency Medicine | Admitting: Emergency Medicine

## 2020-07-24 ENCOUNTER — Other Ambulatory Visit: Payer: Self-pay

## 2020-07-24 DIAGNOSIS — R059 Cough, unspecified: Secondary | ICD-10-CM

## 2020-07-24 DIAGNOSIS — I5033 Acute on chronic diastolic (congestive) heart failure: Secondary | ICD-10-CM | POA: Insufficient documentation

## 2020-07-24 DIAGNOSIS — J989 Respiratory disorder, unspecified: Secondary | ICD-10-CM

## 2020-07-24 DIAGNOSIS — J449 Chronic obstructive pulmonary disease, unspecified: Secondary | ICD-10-CM | POA: Insufficient documentation

## 2020-07-24 DIAGNOSIS — I110 Hypertensive heart disease with heart failure: Secondary | ICD-10-CM | POA: Insufficient documentation

## 2020-07-24 DIAGNOSIS — R0602 Shortness of breath: Secondary | ICD-10-CM

## 2020-07-24 DIAGNOSIS — R062 Wheezing: Secondary | ICD-10-CM

## 2020-07-24 DIAGNOSIS — Z20822 Contact with and (suspected) exposure to covid-19: Secondary | ICD-10-CM | POA: Insufficient documentation

## 2020-07-24 DIAGNOSIS — J069 Acute upper respiratory infection, unspecified: Secondary | ICD-10-CM

## 2020-07-24 DIAGNOSIS — J189 Pneumonia, unspecified organism: Secondary | ICD-10-CM | POA: Insufficient documentation

## 2020-07-24 DIAGNOSIS — I2510 Atherosclerotic heart disease of native coronary artery without angina pectoris: Secondary | ICD-10-CM | POA: Insufficient documentation

## 2020-07-24 DIAGNOSIS — Z79899 Other long term (current) drug therapy: Secondary | ICD-10-CM | POA: Insufficient documentation

## 2020-07-24 LAB — CBC WITH DIFFERENTIAL/PLATELET
Abs Immature Granulocytes: 0.06 10*3/uL (ref 0.00–0.07)
Basophils Absolute: 0.1 10*3/uL (ref 0.0–0.1)
Basophils Relative: 0 %
Eosinophils Absolute: 0.1 10*3/uL (ref 0.0–0.5)
Eosinophils Relative: 1 %
HCT: 37.8 % — ABNORMAL LOW (ref 39.0–52.0)
Hemoglobin: 12.6 g/dL — ABNORMAL LOW (ref 13.0–17.0)
Immature Granulocytes: 0 %
Lymphocytes Relative: 12 %
Lymphs Abs: 1.7 10*3/uL (ref 0.7–4.0)
MCH: 29.6 pg (ref 26.0–34.0)
MCHC: 33.3 g/dL (ref 30.0–36.0)
MCV: 88.7 fL (ref 80.0–100.0)
Monocytes Absolute: 0.5 10*3/uL (ref 0.1–1.0)
Monocytes Relative: 3 %
Neutro Abs: 11.7 10*3/uL — ABNORMAL HIGH (ref 1.7–7.7)
Neutrophils Relative %: 84 %
Platelets: 356 10*3/uL (ref 150–400)
RBC: 4.26 MIL/uL (ref 4.22–5.81)
RDW: 13.2 % (ref 11.5–15.5)
WBC: 14 10*3/uL — ABNORMAL HIGH (ref 4.0–10.5)
nRBC: 0 % (ref 0.0–0.2)

## 2020-07-24 LAB — BRAIN NATRIURETIC PEPTIDE: B Natriuretic Peptide: 36.4 pg/mL (ref 0.0–100.0)

## 2020-07-24 LAB — TROPONIN I (HIGH SENSITIVITY): Troponin I (High Sensitivity): 12 ng/L (ref <18)

## 2020-07-24 LAB — RESP PANEL BY RT-PCR (FLU A&B, COVID) ARPGX2
Influenza A by PCR: NEGATIVE
Influenza B by PCR: NEGATIVE
SARS Coronavirus 2 by RT PCR: NEGATIVE

## 2020-07-24 LAB — BASIC METABOLIC PANEL
Anion gap: 7 (ref 5–15)
BUN: 13 mg/dL (ref 6–20)
CO2: 27 mmol/L (ref 22–32)
Calcium: 8.9 mg/dL (ref 8.9–10.3)
Chloride: 102 mmol/L (ref 98–111)
Creatinine, Ser: 1.15 mg/dL (ref 0.61–1.24)
GFR, Estimated: >60 mL/min (ref >60)
Glucose, Bld: 121 mg/dL — ABNORMAL HIGH (ref 70–99)
Potassium: 3.7 mmol/L (ref 3.5–5.1)
Sodium: 136 mmol/L (ref 135–145)

## 2020-07-24 MED ORDER — DOXYCYCLINE HYCLATE 100 MG PO CAPS: 100.0000 mg | ORAL_CAPSULE | Freq: Two times a day (BID) | ORAL | 0 refills | Status: AC

## 2020-07-24 NOTE — ED Provider Notes (Addendum)
[name]    CSN: [id number] Arrival date & time: 07/24/20  1508      History   Chief Complaint Chief Complaint  Patient presents with   Cough    HPI Brett Salinas is a 50 y.o. male.   Patient presents for cough, sinus congestion and drainage, chills/sweats, and intermittent shortness of breath for approximately 1 week. Denies fever on thermometer but states that he has "felt feverish." Denies any history of chronic lung disease. Denies nausea, vomiting, diarrhea. Has been taking Mucinex OTC with some relief of symptoms.    Cough  Past Medical History:  Diagnosis Date   Achilles tendon rupture-surgery 09/19/15 09/19/2015   Altered mental status 12/23/2016   Arthritis    "hips, knees, back" (10/13/2015)   Bacterial conjunctivitis of right eye 2018   Bipolar disorder (HCC)    CAD -S/P PCI LAD/DES 10/13/15 10/14/2015   CHF (congestive heart failure) (HCC)    Cocaine abuse-drug sceen positive, History of 10/14/2015   Coronary artery disease    Depression    DVT (deep venous thrombosis) (HCC) 09/2015   right   Essential hypertension 11/30/2015   Heart murmur    High cholesterol    Hypertension    Myocardial infarction (HCC) 10/12/2015 X 2-3   NSTEMI (non-ST elevated myocardial infarction) (HCC)    Osteoarthritis    Rectal myiasis    Schizoaffective disorder, bipolar type (HCC) 10/14/2015   Schizoaffective disorder, bipolar type [facility])    Sciatica     Patient Active Problem List   Diagnosis Date Noted   Acute on chronic diastolic CHF (congestive heart failure), NYHA class 3 (HCC) 02/11/2020   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/02/2019   Homelessness 07/02/2019   Hallucinations    Polysubstance abuse (HCC)    AKI (acute kidney injury) (HCC) 09/25/2018   Tobacco use disorder 12/29/2016   COPD (chronic obstructive pulmonary disease) (HCC) 12/28/2016   Dyslipidemia  12/28/2016   Amphetamine use disorder, 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   Drug abuse (HCC)    Agitation 12/24/2016   Aggression 12/24/2016   Altered mental status 12/23/2016   History of ED visit for Medical clearance for incarceration for alleged aggressive behavior and crack cocaine use 09/05/2016   Osteoarthritis 07/20/2016   Essential hypertension 11/30/2015   CAD -S/P PCI LAD/DES 10/13/15 10/14/2015   Wound infection- for I&D achilles wound 10/14/15 10/14/2015   Cocaine abuse-drug sceen positive, History of 10/14/2015   Schizoaffective disorder, bipolar type (HCC) 10/14/2015   Infection 10/14/2015   NSTEMI (non-ST elevated myocardial infarction) [facility])    Chest pain 10/12/2015   Achilles tendon rupture-surgery 09/19/15 09/19/2015    Past Surgical History:  Procedure Laterality Date   CARDIAC CATHETERIZATION N/A 10/13/2015   Procedure: Left Heart Cath and Coronary Angiography;  Surgeon: Corky Crafts, MD;  [facility] INVASIVE CV LAB;  Service: Cardiovascular;  Laterality: N/A;   CARDIAC CATHETERIZATION N/A 10/13/2015   Procedure: Coronary Stent Intervention;  Surgeon: Corky Crafts, MD;  Location: [facility] INVASIVE CV LAB;  Service: Cardiovascular;  Laterality: N/A;   CARDIAC CATHETERIZATION N/A 10/13/2015   Procedure: Intravascular Ultrasound/IVUS;  Surgeon: Corky Crafts, MD;  Location: [facility] INVASIVE CV LAB;  Service: Cardiovascular;  Laterality: N/A;   CORONARY ANGIOPLASTY WITH STENT PLACEMENT  10/13/2015   I & D EXTREMITY Right 09/19/2015   Procedure: IRRIGATION AND DEBRIDEMENT ANKLE LACERATIONS POSSIBLE TENDON REPAIR;  Surgeon: Cammy Copa, MD;  Location: [facility] OR;  Service: Orthopedics;  Laterality: Right;   I & D EXTREMITY Right 10/14/2015   Procedure: IRRIGATION AND DEBRIDEMENT EXTREMITY/Right Ankle;  Surgeon: Cammy Copa, MD;  Location: [facility] OR;  Service: Orthopedics;  Laterality: Right;       Home Medications    Prior to Admission medications   Medication Sig  Start Date End Date Taking? Authorizing Provider  ARIPiprazole ER (ABILIFY MAINTENA) 400 MG SRER injection Inject 2 mLs (400 mg total) into the muscle every 28 (twenty-eight) days. Due 5/1 Patient not taking: Reported on 02/11/2020 05/06/19   Malvin Johns, MD  atorvastatin (LIPITOR) 40 MG tablet Take 1 tablet (40 mg total) by mouth daily. Patient not taking: No sig reported 03/11/19   Lars Masson, MD  diclofenac Sodium (VOLTAREN) 1 % GEL Apply 4 g topically 4 (four) times daily. Patient not taking: Reported on 02/11/2020 03/17/19   Gwyneth Sprout, MD  gabapentin (NEURONTIN) 300 MG capsule Take 1 capsule (300 mg total) by mouth 3 (three) times daily. Patient not taking: Reported on 02/11/2020 05/06/19   Malvin Johns, MD  losartan (COZAAR) 25 MG tablet Take 1 tablet (25 mg total) by mouth daily. Patient not taking: No sig reported 03/11/19   Lars Masson, MD  losartan (COZAAR) 50 MG tablet Take 1 tablet by mouth at bedtime. 03/12/20   [provider]  meloxicam (MOBIC) 15 MG tablet Take 1 tablet by mouth daily. 03/12/20   [provider]  OLANZapine zydis (ZYPREXA) 20 MG disintegrating tablet Take 1 tablet (20 mg total) by mouth at bedtime. Patient not taking: Reported on 02/11/2020 05/06/19   Malvin Johns, MD    Family History History reviewed. No pertinent family history.  Social History Social History   Tobacco Use   Smoking status: Never   Smokeless tobacco: Never  Substance Use Topics   Alcohol use: Yes   Drug use: Yes    Types: Cocaine, Marijuana    Comment: 10/13/2015 "I've tried alot; I don't have any habits"     Allergies   Catfish [fish allergy], Heparin, and Pork-derived products   Review of Systems Review of Systems  Respiratory:  Positive for cough.   Per HPI  Physical Exam Triage Vital Signs ED Triage Vitals  Enc Vitals Group     BP 07/24/20 1627 (!) 126/94     Pulse Rate 07/24/20 1627 92     Resp 07/24/20 1627 18     Temp 07/24/20 1627  98.8 F (37.1 C)     Temp Source 07/24/20 1627 Oral     SpO2 07/24/20 1627 94 %     Weight --      Height --      Head Circumference --      Peak Flow --      Pain Score 07/24/20 1629 7     Pain Loc --      Pain Edu? --      Excl. in GC? --    No data found.  Updated Vital Signs BP (!) 126/94 (BP Location: Left Arm)   Pulse 92   Temp 98.8 F (37.1 C) (Oral)   Resp 18   SpO2 94%   Visual Acuity Right Eye Distance:   Left Eye Distance:   Bilateral Distance:    Right Eye Near:   Left Eye Near:    Bilateral Near:     Physical Exam Constitutional:      General: He is not in acute distress.    Appearance: Normal  appearance.  HENT:     Head: Normocephalic and atraumatic.     Right Ear: Tympanic membrane and ear canal normal.     Left Ear: Tympanic membrane and ear canal normal.     Nose: Congestion present.     Right Turbinates: Enlarged.     Left Turbinates: Enlarged.     Right Sinus: No maxillary sinus tenderness or frontal sinus tenderness.     Left Sinus: No maxillary sinus tenderness or frontal sinus tenderness.     Mouth/Throat:     Mouth: Mucous membranes are moist.     Pharynx: Posterior oropharyngeal erythema present.  Eyes:     Extraocular Movements: Extraocular movements intact.     Conjunctiva/sclera: Conjunctivae normal.     Pupils: Pupils are equal, round, and reactive to light.  Cardiovascular:     Rate and Rhythm: Normal rate and regular rhythm.     Pulses: Normal pulses.     Heart sounds: Normal heart sounds.  Pulmonary:     Effort: Pulmonary effort is normal. No respiratory distress.     Breath sounds: Wheezing and rhonchi present.     Comments: Harsh cough on exam Abdominal:     General: Abdomen is flat. Bowel sounds are normal.     Palpations: Abdomen is soft.  Musculoskeletal:        General: Normal range of motion.     Cervical back: Normal range of motion.  Skin:    General: Skin is warm and dry.  Neurological:     General: No focal  deficit present.     Mental Status: He is alert and oriented to person, place, and time. Mental status is at baseline.  Psychiatric:        Mood and Affect: Mood normal.        Behavior: Behavior normal.        Thought Content: Thought content normal.        Judgment: Judgment normal.     UC Treatments / Results  Labs (all labs ordered are listed, but only abnormal results are displayed) Labs Reviewed  NOVEL CORONAVIRUS, NAA    EKG   Radiology No results found.  Procedures Procedures (including critical care time)  Medications Ordered in UC Medications - No data to display  Initial Impression / Assessment and Plan / UC Course  I have reviewed the triage vital signs and the nursing notes.  Pertinent labs & imaging results that were available during my care of the patient were reviewed by me and considered in my medical decision making (see chart for details).     Oxygen saturation was fluctuating from 86-91 percent during physical exam. Advised patient that he should go to the hospital via ambulance due to low oxygen and adventitious lung sounds but patient refused. Patient was agreeable to wife taking him to the hospital. Risks associated with not going to hospital via ambulance were discussed with patient. Patient voiced understanding. Patient stated that he would leave clinic and have his wife take him to the hospital emergency department for further evaluation and management.  Patient verbalized understanding and is agreeable with plan.  Final Clinical Impressions(s) / UC Diagnoses   Final diagnoses:  Shortness of breath  Wheezing  Acute upper respiratory infection     Discharge Instructions      Please go to the hospital as soon as you leave urgent care for further evaluation and treatment due to shortness of breath, wheezing, and low oxygen.    ED  Prescriptions   None    PDMP not reviewed this encounter.   Lance MussFowler, Oveda Dadamo E, FNP 07/24/20 1703     Lance MussFowler, Cashton Hosley E, FNP 07/24/20 1704

## 2020-07-24 NOTE — ED Notes (Signed)
Pt ambulated in room SpO2 maintained in mid - upper 90's and HR only up to 104 while ambulating.

## 2020-07-24 NOTE — ED Notes (Signed)
Discharge instructions reviewed and explained, pt verbalized understanding.  ?

## 2020-07-24 NOTE — ED Provider Notes (Signed)
[facility] EMERGENCY DEPARTMENT Provider Note   CSN: [id number] Arrival date & time: 07/24/20  1752     History Chief Complaint  Patient presents with   Shortness of Breath   Chest Pain    Brett Salinas is a 50 y.o. male.  Extensive past medical history including CAD s/p stent placement in 2017, heart failure, DVT?, hypertension, hyperlipidemia presenting to ER with concern for cough, congestion and fevers.  Unsure of temperature but felt like he had a fever, had chills.  Symptoms ongoing for the past few days.  Has noted some nasal congestion as well.  Cough is nonproductive.  States that he has had some chest discomfort but only when coughing.  No chest pain at present.  HPI     Past Medical History:  Diagnosis Date   Achilles tendon rupture-surgery 09/19/15 09/19/2015   Altered mental status 12/23/2016   Arthritis    "hips, knees, back" (10/13/2015)   Bacterial conjunctivitis of right eye 2018   Bipolar disorder (HCC)    CAD -S/P PCI LAD/DES 10/13/15 10/14/2015   CHF (congestive heart failure) (HCC)    Cocaine abuse-drug sceen positive, History of 10/14/2015   Coronary artery disease    Depression    DVT (deep venous thrombosis) (HCC) 09/2015   right   Essential hypertension 11/30/2015   Heart murmur    High cholesterol    Hypertension    Myocardial infarction (HCC) 10/12/2015 X 2-3   NSTEMI (non-ST elevated myocardial infarction) (HCC)    Osteoarthritis    Rectal myiasis    Schizoaffective disorder, bipolar type (HCC) 10/14/2015   Schizoaffective disorder, bipolar type [facility])    Sciatica     Patient Active Problem List   Diagnosis Date Noted   Acute on chronic diastolic CHF (congestive heart failure), NYHA class 3 (HCC) 02/11/2020   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/02/2019   Homelessness 07/02/2019   Hallucinations    Polysubstance abuse (HCC)    AKI (acute  kidney injury) (HCC) 09/25/2018   Tobacco use disorder 12/29/2016   COPD (chronic obstructive pulmonary disease) (HCC) 12/28/2016   Dyslipidemia 12/28/2016   Amphetamine use disorder, 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   Drug abuse (HCC)    Agitation 12/24/2016   Aggression 12/24/2016   Altered mental status 12/23/2016   History of ED visit for Medical clearance for incarceration for alleged aggressive behavior and crack cocaine use 09/05/2016   Osteoarthritis 07/20/2016   Essential hypertension 11/30/2015   CAD -S/P PCI LAD/DES 10/13/15 10/14/2015   Wound infection- for I&D achilles wound 10/14/15 10/14/2015   Cocaine abuse-drug sceen positive, History of 10/14/2015   Schizoaffective disorder, bipolar type (HCC) 10/14/2015   Infection 10/14/2015   NSTEMI (non-ST elevated myocardial infarction) [facility])    Chest pain 10/12/2015   Achilles tendon rupture-surgery 09/19/15 09/19/2015    Past Surgical History:  Procedure Laterality Date   CARDIAC CATHETERIZATION N/A 10/13/2015   Procedure: Left Heart Cath and Coronary Angiography;  Surgeon: Corky Crafts, MD;  Location: [facility] INVASIVE CV LAB;  Service: Cardiovascular;  Laterality: N/A;   CARDIAC CATHETERIZATION N/A 10/13/2015   Procedure: Coronary Stent Intervention;  Surgeon: Corky Crafts, MD;  Location: [facility] INVASIVE CV LAB;  Service: Cardiovascular;  Laterality: N/A;   CARDIAC CATHETERIZATION N/A 10/13/2015   Procedure: Intravascular Ultrasound/IVUS;  Surgeon: Corky Crafts, MD;  Location: [facility] INVASIVE CV LAB;  Service: Cardiovascular;  Laterality: N/A;   CORONARY ANGIOPLASTY WITH STENT  PLACEMENT  10/13/2015   I & D EXTREMITY Right 09/19/2015   Procedure: IRRIGATION AND DEBRIDEMENT ANKLE LACERATIONS POSSIBLE TENDON REPAIR;  Surgeon: Cammy Copa, MD;  Location: [facility] OR;  Service: Orthopedics;  Laterality: Right;   I & D EXTREMITY Right 10/14/2015   Procedure: IRRIGATION AND DEBRIDEMENT EXTREMITY/Right Ankle;  Surgeon: Cammy Copa,  MD;  Location: [facility] OR;  Service: Orthopedics;  Laterality: Right;       No family history on file.  Social History   Tobacco Use   Smoking status: Never   Smokeless tobacco: Never  Substance Use Topics   Alcohol use: Yes   Drug use: Yes    Types: Cocaine, Marijuana    Comment: 10/13/2015 "I've tried alot; I don't have any habits"    Home Medications Prior to Admission medications   Medication Sig Start Date End Date Taking? Authorizing Provider  doxycycline (VIBRAMYCIN) 100 MG capsule Take 1 capsule (100 mg total) by mouth 2 (two) times daily for 7 days. 07/24/20 07/31/20 Yes Niema Carrara, Quitman Livings, MD  ARIPiprazole ER (ABILIFY MAINTENA) 400 MG SRER injection Inject 2 mLs (400 mg total) into the muscle every 28 (twenty-eight) days. Due 5/1 Patient not taking: Reported on 02/11/2020 05/06/19   Malvin Johns, MD  atorvastatin (LIPITOR) 40 MG tablet Take 1 tablet (40 mg total) by mouth daily. Patient not taking: No sig reported 03/11/19   Lars Masson, MD  diclofenac Sodium (VOLTAREN) 1 % GEL Apply 4 g topically 4 (four) times daily. Patient not taking: Reported on 02/11/2020 03/17/19   Gwyneth Sprout, MD  gabapentin (NEURONTIN) 300 MG capsule Take 1 capsule (300 mg total) by mouth 3 (three) times daily. Patient not taking: Reported on 02/11/2020 05/06/19   Malvin Johns, MD  losartan (COZAAR) 25 MG tablet Take 1 tablet (25 mg total) by mouth daily. Patient not taking: No sig reported 03/11/19   Lars Masson, MD  losartan (COZAAR) 50 MG tablet Take 1 tablet by mouth at bedtime. 03/12/20   [provider]  meloxicam (MOBIC) 15 MG tablet Take 1 tablet by mouth daily. 03/12/20   [provider]  OLANZapine zydis (ZYPREXA) 20 MG disintegrating tablet Take 1 tablet (20 mg total) by mouth at bedtime. Patient not taking: Reported on 02/11/2020 05/06/19   Malvin Johns, MD    Allergies    Lucita Ferrara allergy], Heparin, and Pork-derived products  Review of Systems   Review of  Systems  Constitutional:  Positive for chills and fever.  HENT:  Positive for congestion. Negative for ear pain and sore throat.   Eyes:  Negative for pain and visual disturbance.  Respiratory:  Positive for cough and shortness of breath.   Cardiovascular:  Positive for chest pain. Negative for palpitations.  Gastrointestinal:  Negative for abdominal pain and vomiting.  Genitourinary:  Negative for dysuria and hematuria.  Musculoskeletal:  Negative for arthralgias and back pain.  Skin:  Negative for color change and rash.  Neurological:  Negative for seizures and syncope.  All other systems reviewed and are negative.  Physical Exam Updated Vital Signs BP 131/79   Pulse 82   Temp 98.2 F (36.8 C) (Oral)   Resp 19   SpO2 97%   Physical Exam Vitals and nursing note reviewed.  Constitutional:      Appearance: He is well-developed.  HENT:     Head: Normocephalic and atraumatic.  Eyes:     Conjunctiva/sclera: Conjunctivae normal.  Cardiovascular:     Rate and Rhythm: Normal  rate and regular rhythm.     Heart sounds: No murmur heard. Pulmonary:     Effort: Pulmonary effort is normal. No respiratory distress.     Breath sounds: Normal breath sounds.  Abdominal:     Palpations: Abdomen is soft.     Tenderness: There is no abdominal tenderness.  Musculoskeletal:        General: Normal range of motion.     Cervical back: Neck supple.     Right lower leg: No edema.     Left lower leg: No edema.  Skin:    General: Skin is warm and dry.  Neurological:     Mental Status: He is alert.    ED Results / Procedures / Treatments   Labs (all labs ordered are listed, but only abnormal results are displayed) Labs Reviewed  CBC WITH DIFFERENTIAL/PLATELET - Abnormal; Notable for the following components:      Result Value   WBC 14.0 (*)    Hemoglobin 12.6 (*)    HCT 37.8 (*)    Neutro Abs 11.7 (*)    All other components within normal limits  BASIC METABOLIC PANEL - Abnormal;  Notable for the following components:   Glucose, Bld 121 (*)    All other components within normal limits  RESP PANEL BY RT-PCR (FLU A&B, COVID) ARPGX2  BRAIN NATRIURETIC PEPTIDE  TROPONIN I (HIGH SENSITIVITY)  TROPONIN I (HIGH SENSITIVITY)    EKG EKG Interpretation  Date/Time:  Friday July 24 2020 18:12:55 EDT Ventricular Rate:  103 PR Interval:  146 QRS Duration: 92 QT Interval:  334 QTC Calculation: 437 R Axis:   79 Text Interpretation: Sinus tachycardia with Premature atrial complexes Minimal voltage criteria for LVH, may be normal variant ( Sokolow-Lyon ) Borderline ECG Confirmed by Marianna Fussykstra, Huckleberry Martinson ([id number]) on 07/24/2020 10:14:40 PM  Radiology DG Chest Portable 1 View  Result Date: 07/24/2020 CLINICAL DATA:  Productive cough and fatigue for the past 4-5 days. EXAM: PORTABLE CHEST 1 VIEW COMPARISON:  Chest x-ray dated September 25, 2018. FINDINGS: The heart size and mediastinal contours are within normal limits. Normal pulmonary vascularity. Mild patchy opacities at the lung bases. No focal consolidation, pleural effusion, or pneumothorax. No acute osseous abnormality. IMPRESSION: There are findings in the lungs which are nonspecific, but concerning for atypical infection, including potential viral pneumonia. Electronically Signed   By: Obie DredgeWilliam T Derry M.D.   On: 07/24/2020 20:54    Procedures Procedures   Medications Ordered in ED Medications - No data to display  ED Course  I have reviewed the triage vital signs and the nursing notes.  Pertinent labs & imaging results that were available during my care of the patient were reviewed by me and considered in my medical decision making (see chart for details).    MDM Rules/Calculators/A&P                          50 year old gentleman with extensive past medical history including history of coronary artery disease presenting to ER with concern for cough, congestion, shortness of breath.  On physical exam patient was noted to be  quite well-appearing in fact, no distress, normal vital signs.  Did note chest pain albeit only with coughing therefore check EKG, troponin.  No acute ischemic change appreciated, troponin within normal limits, given these findings and description of his chest pain, very low suspicion for ACS at present, suspect more likely related to coughing.  Chest x-ray with some nonspecific findings  concerning for atypical infection, possibly viral pneumonia.  Sent COVID testing.  Will prescribe course of doxycycline to cover possible bacterial cause for possible pneumonia seen on CXR.  Given symptomatology suspect more likely viral upper respiratory illness.  Did well on ambulation trial.  At present believe he is appropriate for discharge and outpatient management.  Recommended close recheck with primary doctor, discharged home.      After the discussed management above, the patient was determined to be safe for discharge.  The patient was in agreement with this plan and all questions regarding their care were answered.  ED return precautions were discussed and the patient will return to the ED with any significant worsening of condition.  Final Clinical Impression(s) / ED Diagnoses Final diagnoses:  Cough  Respiratory illness  Community acquired pneumonia, unspecified laterality    Rx / DC Orders ED Discharge Orders          Ordered    doxycycline (VIBRAMYCIN) 100 MG capsule  2 times daily        07/24/20 2320             Milagros Loll, MD 07/26/20 1912

## 2020-07-24 NOTE — Discharge Instructions (Addendum)
Please go to the hospital as soon as you leave urgent care for further evaluation and treatment due to shortness of breath, wheezing, and low oxygen.

## 2020-07-24 NOTE — ED Provider Notes (Signed)
Emergency Medicine Provider Triage Evaluation Note  Brett Salinas , a 50 y.o. male  was evaluated in triage.  Pt complains of cough, congestion, and fevers.  He also endorses headache and diminished appetite.  Patient denies any recent travel or obvious sick contacts.  He has mild chest discomfort, but predominantly only when coughing.  Denies any hemoptysis or history of clots.  He is not vaccinated for COVID-19.  Review of Systems  Positive: Cough, congestion, fevers, diminished appetite, HA. Negative: Hemoptysis, leg swelling, abdominal pain, N/V.  Physical Exam  BP 111/72 (BP Location: Left Arm)   Pulse 99   Temp 98.7 F (37.1 C)   Resp 17   SpO2 94%  Gen:   Awake, no distress   Resp:  No significant increased work of breathing.  Intermittent coughing fits.  Mild rales/diminished breath sounds. MSK:   Moves extremities without difficulty  Other:    Medical Decision Making  Medically screening exam initiated at 6:23 PM.  Appropriate orders placed.  Jamine Highfill was informed that the remainder of the evaluation will be completed by another provider, this initial triage assessment does not replace that evaluation, and the importance of remaining in the ED until their evaluation is complete.     Lorelee New, PA-C 07/24/20 1823    Virgina Norfolk, DO 07/24/20 0037

## 2020-07-24 NOTE — ED Notes (Signed)
Pt given sandwich bag, graham crackers and a sprite

## 2020-07-24 NOTE — Discharge Instructions (Addendum)
Please check MyChart to see results of your COVID test.  If positive, please contact your primary care doctor to discuss potential treatment options.  If positive, please follow CDC guidelines regarding isolation precautions.  Take antibiotic as prescribed.  Return to ER if you develop difficulty in breathing, chest pain or other new concerning symptom.  Please call your primary doctor for recheck early next week.

## 2020-07-24 NOTE — ED Triage Notes (Signed)
Pt here from UC for continued eval of URI. States he was diagnosed w same at [facility] and advised to come here due to noted hypoxia (91%). SHOB, CP w inspiration, sinus pressure, fatigue x4-5 days. Denies known sick contact, denies covid vaccine.

## 2020-07-24 NOTE — ED Triage Notes (Signed)
4-5 day h/o productive cough, sinus pressure, fatigue and decreased appetite. Emesis x1. No sore throat, diarrhea or rashes. No known sick contacts.

## 2020-07-25 LAB — SARS-COV-2, NAA 2 DAY TAT

## 2020-07-25 LAB — NOVEL CORONAVIRUS, NAA: SARS-CoV-2, NAA: NOT DETECTED

## 2020-09-13 ENCOUNTER — Emergency Department (HOSPITAL_COMMUNITY): Payer: Self-pay

## 2020-09-13 ENCOUNTER — Emergency Department (HOSPITAL_COMMUNITY)
Admission: EM | Admit: 2020-09-13 | Discharge: 2020-09-14 | Disposition: A | Discharge status: Home / Self Care | Payer: Self-pay | Attending: Emergency Medicine | Admitting: Emergency Medicine

## 2020-09-13 DIAGNOSIS — Z20822 Contact with and (suspected) exposure to covid-19: Secondary | ICD-10-CM | POA: Insufficient documentation

## 2020-09-13 DIAGNOSIS — R4182 Altered mental status, unspecified: Secondary | ICD-10-CM | POA: Insufficient documentation

## 2020-09-13 DIAGNOSIS — F29 Unspecified psychosis not due to a substance or known physiological condition: Secondary | ICD-10-CM | POA: Insufficient documentation

## 2020-09-13 DIAGNOSIS — E876 Hypokalemia: Secondary | ICD-10-CM | POA: Insufficient documentation

## 2020-09-13 DIAGNOSIS — F19959 Other psychoactive substance use, unspecified with psychoactive substance-induced psychotic disorder, unspecified: Secondary | ICD-10-CM | POA: Insufficient documentation

## 2020-09-13 DIAGNOSIS — F200 Paranoid schizophrenia: Secondary | ICD-10-CM | POA: Insufficient documentation

## 2020-09-13 LAB — CBC WITH DIFFERENTIAL/PLATELET
Abs Immature Granulocytes: 0.01 10*3/uL (ref 0.00–0.07)
Basophils Absolute: 0 10*3/uL (ref 0.0–0.1)
Basophils Relative: 0 %
Eosinophils Absolute: 0 10*3/uL (ref 0.0–0.5)
Eosinophils Relative: 0 %
HCT: 38.6 % — ABNORMAL LOW (ref 39.0–52.0)
Hemoglobin: 12.8 g/dL — ABNORMAL LOW (ref 13.0–17.0)
Immature Granulocytes: 0 %
Lymphocytes Relative: 10 %
Lymphs Abs: 0.6 10*3/uL — ABNORMAL LOW (ref 0.7–4.0)
MCH: 29.5 pg (ref 26.0–34.0)
MCHC: 33.2 g/dL (ref 30.0–36.0)
MCV: 88.9 fL (ref 80.0–100.0)
Monocytes Absolute: 0.2 10*3/uL (ref 0.1–1.0)
Monocytes Relative: 4 %
Neutro Abs: 4.8 10*3/uL (ref 1.7–7.7)
Neutrophils Relative %: 86 %
Platelets: 172 10*3/uL (ref 150–400)
RBC: 4.34 MIL/uL (ref 4.22–5.81)
RDW: 13.7 % (ref 11.5–15.5)
WBC: 5.6 10*3/uL (ref 4.0–10.5)
nRBC: 0 % (ref 0.0–0.2)

## 2020-09-13 LAB — COMPREHENSIVE METABOLIC PANEL
ALT: 17 U/L (ref 0–44)
AST: 35 U/L (ref 15–41)
Albumin: 4 g/dL (ref 3.5–5.0)
Alkaline Phosphatase: 60 U/L (ref 38–126)
Anion gap: 11 (ref 5–15)
BUN: 23 mg/dL — ABNORMAL HIGH (ref 6–20)
CO2: 24 mmol/L (ref 22–32)
Calcium: 9.1 mg/dL (ref 8.9–10.3)
Chloride: 106 mmol/L (ref 98–111)
Creatinine, Ser: 1.28 mg/dL — ABNORMAL HIGH (ref 0.61–1.24)
GFR, Estimated: 38 mL/min — ABNORMAL LOW (ref >60)
Glucose, Bld: 89 mg/dL (ref 70–99)
Potassium: 3.1 mmol/L — ABNORMAL LOW (ref 3.5–5.1)
Sodium: 141 mmol/L (ref 135–145)
Total Bilirubin: 1.5 mg/dL — ABNORMAL HIGH (ref 0.3–1.2)
Total Protein: 7.1 g/dL (ref 6.5–8.1)

## 2020-09-13 LAB — BLOOD GAS, VENOUS
Acid-base deficit: 1.5 mmol/L (ref 0.0–2.0)
Bicarbonate: 24 mmol/L (ref 20.0–28.0)
FIO2: 21
O2 Saturation: 93.3 %
Patient temperature: 98.6
pCO2, Ven: 46.2 mmHg (ref 44.0–60.0)
pH, Ven: 7.336 (ref 7.250–7.430)
pO2, Ven: 76.4 mmHg — ABNORMAL HIGH (ref 32.0–45.0)

## 2020-09-13 LAB — LACTIC ACID, PLASMA
Lactic Acid, Venous: 2.8 mmol/L (ref 0.5–1.9)
Lactic Acid, Venous: 1.3 mmol/L (ref 0.5–1.9)

## 2020-09-13 LAB — RESP PANEL BY RT-PCR (FLU A&B, COVID) ARPGX2
Influenza A by PCR: NEGATIVE
Influenza B by PCR: NEGATIVE
SARS Coronavirus 2 by RT PCR: NEGATIVE

## 2020-09-13 LAB — ETHANOL: Alcohol, Ethyl (B): <10 mg/dL (ref <10)

## 2020-09-13 MED ORDER — POTASSIUM CHLORIDE CRYS ER 20 MEQ PO TBCR
40.0000 meq | EXTENDED_RELEASE_TABLET | Freq: Once | ORAL | Status: DC
Start: 2020-09-13 — End: 2020-09-15

## 2020-09-13 MED ORDER — SODIUM CHLORIDE 0.9 % IV BOLUS
3000.0000 mL | Freq: Once | INTRAVENOUS | Status: AC
  Administered 2020-09-13: 3000 mL via INTRAVENOUS

## 2020-09-13 NOTE — ED Notes (Signed)
Dr wentz at bedside 

## 2020-09-13 NOTE — ED Notes (Signed)
Patient removed all monitoring equipment and is refusing vitals, RN aware. Will attempt at a later time.

## 2020-09-13 NOTE — ED Provider Notes (Signed)
[facility]-EMERGENCY DEPT Provider Note   CSN: [id number] Arrival date & time: 09/13/20  0711     History Chief Complaint  Patient presents with   Altered Mental Status    Brett Salinas is a 50 y.o. Salinas.  HPI He presents by EMS for evaluation of agitated behavior which required sedation in the field.  He received Haldol 10 mg and Versed 5 mg IM.  This improved his agitation however he became somnolent.  On arrival he is noncommunicative and hypotensive.  Level 5 caveat-altered mental status    No past medical history on file.  There are no problems to display for this patient.      No family history on file.     Home Medications Prior to Admission medications   Not on File    Allergies    Patient has no allergy information on record.  Review of Systems   Review of Systems  Unable to perform ROS: Mental status change   Physical Exam Updated Vital Signs BP 123/89   Pulse 79   Temp 97.8 F (36.6 C) (Oral)   Resp 18   SpO2 100%   Physical Exam Vitals and nursing note reviewed.  Constitutional:      General: He is in acute distress.     Appearance: He is well-developed. He is ill-appearing. He is not toxic-appearing or diaphoretic.  HENT:     Head: Normocephalic and atraumatic.     Right Ear: External ear normal.     Left Ear: External ear normal.     Mouth/Throat:     Mouth: Mucous membranes are dry.  Eyes:     Conjunctiva/sclera: Conjunctivae normal.     Pupils: Pupils are equal, round, and reactive to light.  Neck:     Trachea: Phonation normal.  Cardiovascular:     Rate and Rhythm: Normal rate and regular rhythm.     Heart sounds: Normal heart sounds.  Pulmonary:     Effort: Pulmonary effort is normal.     Breath sounds: Normal breath sounds.  Abdominal:     General: There is no distension.     Palpations: Abdomen is soft.     Tenderness: There is no abdominal tenderness.  Musculoskeletal:        General: No  swelling, tenderness, deformity or signs of injury. Normal range of motion.     Cervical back: Normal range of motion and neck supple.  Skin:    General: Skin is warm and dry.  Neurological:     Mental Status: He is alert.     GCS: GCS eye subscore is 1. GCS verbal subscore is 1. GCS motor subscore is 5.     Cranial Nerves: No cranial nerve deficit.     Motor: No tremor or abnormal muscle tone.  Psychiatric:     Comments: Obtunded    ED Results / Procedures / Treatments   Labs (all labs ordered are listed, but only abnormal results are displayed) Labs Reviewed  COMPREHENSIVE METABOLIC PANEL - Abnormal; Notable for the following components:      Result Value   Potassium 3.1 (*)    Creatinine, Ser 1.28 (*)    Total Bilirubin 1.5 (*)    GFR, Estimated 38 (*)    All other components within normal limits  CBC WITH DIFFERENTIAL/PLATELET - Abnormal; Notable for the following components:   Hemoglobin 12.8 (*)    HCT 38.6 (*)    Lymphs Abs 0.6 (*)  All other components within normal limits  BLOOD GAS, VENOUS - Abnormal; Notable for the following components:   pO2, Ven 76.4 (*)    All other components within normal limits  LACTIC ACID, PLASMA - Abnormal; Notable for the following components:   Lactic Acid, Venous 2.8 (*)    All other components within normal limits  RESP PANEL BY RT-PCR (FLU A&B, COVID) ARPGX2  CULTURE, BLOOD (ROUTINE X 2)  CULTURE, BLOOD (ROUTINE X 2)  ETHANOL  LACTIC ACID, PLASMA  RAPID URINE DRUG SCREEN, HOSP PERFORMED  URINALYSIS, ROUTINE W REFLEX MICROSCOPIC    EKG None  Radiology CT Head Wo Contrast  Result Date: 09/13/2020 CLINICAL DATA:  Delirium.  Confusion. EXAM: CT HEAD WITHOUT CONTRAST TECHNIQUE: Contiguous axial images were obtained from the base of the skull through the vertex without intravenous contrast. COMPARISON:  May 10, 2020 FINDINGS: Brain: No evidence of acute infarction, hemorrhage, hydrocephalus, extra-axial collection or mass  lesion/mass effect. Vascular: No hyperdense vessel or unexpected calcification. Skull: Normal. Negative for fracture or focal lesion. Sinuses/Orbits: No acute finding. Other: None. IMPRESSION: Normal.  No abnormalities are identified. Electronically Signed   By: Gerome Sam III M.D.   On: 09/13/2020 09:21   DG Chest Port 1 View  Result Date: 09/13/2020 CLINICAL DATA:  Altered mental status EXAM: PORTABLE CHEST 1 VIEW COMPARISON:  None. FINDINGS: Normal mediastinum and cardiac silhouette. Normal pulmonary vasculature. No evidence of effusion, infiltrate, or pneumothorax. No acute bony abnormality. IMPRESSION: No acute cardiopulmonary process. Electronically Signed   By: Genevive Bi M.D.   On: 09/13/2020 07:57    Procedures Procedures   Medications Ordered in ED Medications  potassium chloride SA (KLOR-CON) CR tablet 40 mEq (40 mEq Oral Patient Refused/Not Given 09/13/20 1135)  sodium chloride 0.9 % bolus 3,000 mL (0 mLs Intravenous Stopped 09/13/20 0913)    ED Course  I have reviewed the triage vital signs and the nursing notes.  Pertinent labs & imaging results that were available during my care of the patient were reviewed by me and considered in my medical decision making (see chart for details).  Clinical Course as of 09/14/20 1050  Sun Sep 13, 2020  1607 Patient with very low GCS, likely due to sedating medications.  Will observe closely, at this point airway seems controlled. [EW]  [id number] Vital signs are normal, initial lactate elevated.  I suspect that this is related to his sedation after agitation, and not indicative of an acute infectious process.  Initial temperature was low but was done axillary.  We will check oral temperature at this time.  Currently he is arousable to voice and verbalizes but has arthritic speech.  He request "water," but declined to tell me what his name is. [EW]  1006 Patient is now awake, alert, responsive and verbal.  He answers questions tangentially  and repeatedly asked for water.  He has to talk to me "in private," and was noted to be agitated, grabbing at sheets, distracted and not giving direct eye contact.  He respectfully thanked me when I authorized the nurse to give him water.  At this time we will continue to observe.  He is probably responding to internal stimuli, as evidenced by distractibility. [EW]    Clinical Course User Index [EW] Mancel Bale, MD   MDM Rules/Calculators/A&P                            Patient Vitals for the past 24  hrs:  BP Temp Temp src Pulse Resp SpO2  09/13/20 1300 123/89 -- -- 79 18 100 %  09/13/20 1230 (!) 127/94 -- -- 65 16 100 %  09/13/20 1150 (!) 135/100 -- -- 71 18 99 %  09/13/20 0930 (!) 137/99 -- -- 81 (!) 22 98 %  09/13/20 0921 -- 97.8 F (36.6 C) Oral -- -- --  09/13/20 0900 124/88 -- -- 73 14 99 %  09/13/20 0830 96/63 -- -- 79 15 98 %  09/13/20 0810 94/63 -- -- 80 15 98 %  09/13/20 0729 (!) 78/56 (!) 97.2 F (36.2 C) Axillary 93 19 97 %    4:45 PM Reevaluation with update and discussion. After initial assessment and treatment, an updated evaluation reveals he continues to be alert, calm and cooperative. Mancel Bale   Medical Decision Making:  This patient is presenting for evaluation of combative and aggressive behavior, which does require a range of treatment options, and is a complaint that involves a high risk of morbidity and mortality. The differential diagnoses include acute psychosis, behavioral disorder, acute toxicity. I decided to review old records, and in summary Brett Salinas, presenting from a hotel where he was argumentative, and combative, destroying property.    Clinical Laboratory Tests Ordered, included CBC, Metabolic panel, and lactate, viral panel, venous blood gas . Review indicates normal except potassium low, creatinine high. Radiologic Tests Ordered, included chest x-ray, CT head.  I independently Visualized: Radiographic images, which show no acute  abnormalities   Critical Interventions-clinical evaluation, laboratory testing, imaging, observation reassess  After These Interventions, the Patient was reevaluated and was found improved, stable, with ongoing psychiatric issues.  Patient did not require additional sedation here he was treated with high-volume saline bolus, for suspected dehydration.  Patient did receive benzodiazepine and Haldol in the field by EMS.  Psychiatric services consulted to evaluate patient.  CRITICAL CARE-no Performed by: Mancel Bale  Nursing Notes Reviewed/ Care Coordinated Applicable Imaging Reviewed Interpretation of Laboratory Data incorporated into ED treatment   Plan as per oncoming provider team in conjunction with TTS.    Final Clinical Impression(s) / ED Diagnoses Final diagnoses:  Altered mental status, unspecified altered mental status type  Hypokalemia  Psychosis, unspecified psychosis type [facility])    Rx / DC Orders ED Discharge Orders     None        Mancel Bale, MD 09/14/20 1051

## 2020-09-13 NOTE — ED Notes (Signed)
Dr. Effie Shy aware that patient refuses to keep monitoring devices on. Vitals returned to normal limits prior.

## 2020-09-13 NOTE — ED Notes (Signed)
PT is now awake, still confused, pull out his iv and took all monitoring devices off.

## 2020-09-13 NOTE — ED Triage Notes (Signed)
Pt arrives via ems from [facility] [address]. EMS states a bystander called 911 when they noticed the patient trying to destroy a room and was confused. EMS administered 5mg  of versed and 10mg  of haldol via IM due to patient's behavior. Pt also arrives hypotensive during arrival. 

## 2020-09-13 NOTE — ED Notes (Signed)
PT awake and alert, but states he will not tell staff who he is. Pt also continues to refuse vital signs. Pt is in no apparent distress. Blanket was requested and given to patient. No other needs expressed at this time

## 2020-09-13 NOTE — ED Notes (Signed)
Patient is asleep.  

## 2020-09-13 NOTE — ED Notes (Addendum)
Patient continuing to refuse vital signs, RN aware. Will attempt to collect VS at a later time.

## 2020-09-13 NOTE — BH Assessment (Signed)
Clinician made contact with pt's team at 2010, requesting to be notified when pt is clear and coherent, so pt can participate in his MH Assessment. Pt's nurse tech, Faith NT, replied at 2011 and agreed to make contact with clinician when pt is able to participate.

## 2020-09-13 NOTE — BH Assessment (Signed)
TTS attempted to assess patient, but was unable to understand what patient was saying and TTS was not sure he was comprehending what was asked of him.  TTS will attempt to assess patient when he is more alert.

## 2020-09-13 NOTE — ED Notes (Signed)
Went into room to introduce myself to patient. Patient answers to his name, however declined to answer any other questions. Patient did allow me to get his blood pressure, but also stated that we did not know what we were doing. Patient seems confused when talking to him and uncooperative at times.

## 2020-09-13 NOTE — ED Notes (Signed)
Staff found small bag of cash in patient's sock. Cash was counted and locked up in security office by security Beatriz Stallion) and witnessed by this writer Ursula Alert). Lock box key to box #19 and receipt of cash placed in patient bin at 9-12 nurse's station by this writer Ursula Alert) and witnessed by pt's RN (I Logan Bores).

## 2020-09-14 LAB — URINALYSIS, ROUTINE W REFLEX MICROSCOPIC
Bilirubin Urine: NEGATIVE
Glucose, UA: NEGATIVE mg/dL
Hgb urine dipstick: NEGATIVE
Ketones, ur: 5 mg/dL — AB
Leukocytes,Ua: NEGATIVE
Nitrite: NEGATIVE
Protein, ur: NEGATIVE mg/dL
Specific Gravity, Urine: 1.018 (ref 1.005–1.030)
pH: 6 (ref 5.0–8.0)

## 2020-09-14 LAB — RAPID URINE DRUG SCREEN, HOSP PERFORMED
Amphetamines: NOT DETECTED
Barbiturates: NOT DETECTED
Benzodiazepines: POSITIVE — AB
Cocaine: POSITIVE — AB
Opiates: NOT DETECTED
Tetrahydrocannabinol: NOT DETECTED

## 2020-09-14 NOTE — ED Notes (Signed)
Went in to get vitals and urine sample from patient. Patient stated I did not know what I was doing and he could reach he urinal. Patient voided some int he urinal and one the bed. Patient refused to allow me to help him and did not want to be cleaned up.

## 2020-09-14 NOTE — ED Notes (Signed)
Security notified for belongings to be returned.

## 2020-09-14 NOTE — BH Assessment (Signed)
NEEDS TO BE SEEN: @1557  on 09/14/2020, requested patient's nurse 09/16/2020) to place the TTS machine in patient's room.

## 2020-09-14 NOTE — ED Provider Notes (Signed)
6:47 PM  Patient presenting to ED for evaluation of combative and aggressive behavior. Evaluated by previous provider, medically cleared, and pending TTS evaluation. TTS completed at this time with recommendations for out patient follow up. I personally met patient, he is agreeable with plan, he appears to be of sound mind and judgement. All questions answered prior to discharge. Patient discharged and follow up information provided.    Lianne Cure, DO 59/09/31 1900

## 2020-09-14 NOTE — ED Notes (Signed)
Patient is asleep in bed

## 2020-09-14 NOTE — Progress Notes (Signed)
Disposition CSW placed information in AVS for patient to follow up with ACTT service providers.   Signed:  Corky Crafts, MSW, [place], LCASA 09/14/2020 5:10 PM

## 2020-09-14 NOTE — BH Assessment (Addendum)
NEEDS TO BE SEEN @0724 , followed up with patient's nurse , RN), requested TTS machine to placed in patient's room for his initial assessment, if patient is able to be appropriately assessed.

## 2020-09-14 NOTE — ED Notes (Signed)
Pt is asleep
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
## 2020-09-14 NOTE — ED Notes (Signed)
Belongings retrieved from security office. Cash was listed under a Brett Salinas name. MRN verified and belongs to pt. Paperwork signed and pt confirms all belongings are accounted for.

## 2020-09-14 NOTE — BH Assessment (Signed)
TTS completed. Discussed clinicals with Dorena Bodo, RN, whom recommended psych clearance. Patient to follow up with outpatient psychiatric services. The Disposition LCSW has noted referrals on patient's AVS.

## 2020-09-18 LAB — CULTURE, BLOOD (ROUTINE X 2)
Culture: NO GROWTH
Culture: NO GROWTH
Special Requests: ADEQUATE
Special Requests: ADEQUATE

## 2020-11-09 ENCOUNTER — Ambulatory Visit (HOSPITAL_COMMUNITY)
Admission: EM | Admit: 2020-11-09 | Discharge: 2020-11-09 | Disposition: A | Discharge status: Home / Self Care | Payer: No Payment, Other | Attending: Registered Nurse | Admitting: Registered Nurse

## 2020-11-09 ENCOUNTER — Other Ambulatory Visit: Payer: Self-pay

## 2020-11-09 ENCOUNTER — Encounter (HOSPITAL_COMMUNITY): Payer: Self-pay | Admitting: Registered Nurse

## 2020-11-09 DIAGNOSIS — F1910 Other psychoactive substance abuse, uncomplicated: Secondary | ICD-10-CM

## 2020-11-09 DIAGNOSIS — F1494 Cocaine use, unspecified with cocaine-induced mood disorder: Secondary | ICD-10-CM

## 2020-11-09 DIAGNOSIS — F1490 Cocaine use, unspecified, uncomplicated: Secondary | ICD-10-CM | POA: Insufficient documentation

## 2020-11-09 DIAGNOSIS — Z560 Unemployment, unspecified: Secondary | ICD-10-CM | POA: Insufficient documentation

## 2020-11-09 DIAGNOSIS — Z5900 Homelessness unspecified: Secondary | ICD-10-CM

## 2020-11-09 DIAGNOSIS — Z9114 Patient's other noncompliance with medication regimen: Secondary | ICD-10-CM | POA: Insufficient documentation

## 2020-11-09 DIAGNOSIS — F19959 Other psychoactive substance use, unspecified with psychoactive substance-induced psychotic disorder, unspecified: Secondary | ICD-10-CM | POA: Diagnosis present

## 2020-11-09 DIAGNOSIS — F1994 Other psychoactive substance use, unspecified with psychoactive substance-induced mood disorder: Secondary | ICD-10-CM | POA: Diagnosis present

## 2020-11-09 DIAGNOSIS — F250 Schizoaffective disorder, bipolar type: Secondary | ICD-10-CM | POA: Insufficient documentation

## 2020-11-09 NOTE — Discharge Instructions (Signed)
Substance Abuse Resources  [facility] - Admissions are currently completed Monday through Friday at 8am; both appointments and walk-ins are accepted.  Any individual that is a [place] resident may present for a substance abuse screening and assessment for admission.  A person may be referred by numerous sources or self-refer.   Potential clients will be screened for medical necessity and appropriateness for the program.  Clients must meet criteria for high-intensity residential treatment services.  If clinically appropriate, a client will continue with the comprehensive clinical assessment and intake process, as well as enrollment in the [facility].   Address: [address] [place], [place] [address] Admin Hours: Mon-Fri 8AM to 5PM Center Hours: 24/7 Phone: [phone] Fax: [phone]   [facility] (Det[facility]) Facility Based Crisis:  These are 3 locations for services: Please call before arrival    Address: [address] [place], [place] [address] Phone: [phone]   Address: [address], [place], [place] [address] Phone#: [phone]   Address: [address], [place], [place] [address] Phone#: [phone]     [facility]): (offers outpatient therapy and intensive outpatient substance abuse therapy).  [address], [place], [place] [address] Phone: [phone]   [facility]: Offers FREE recovery skills classes, support groups, 1:1 Peer Support, and Compeer Classes. [address], [place], [place] [address] Phone: [phone] (Call to complete intake).  [facility] [address] [place], [place] [address] Phone: [phone] ext: [phone] [facility] provides food, shelter and other programs and services to the homeless men of [place] through our men's program.   By offering safe shelter, three meals a day,  clean clothing, Biblical counseling, financial planning, vocational training, GED/education and employment assistance, we've helped mend the shattered lives of many homeless men since opening in 1974.   We have approximately 267 beds available, with a max of 312 beds including mats for emergency situations and currently house an average of 270 men a night.   Prospective Client Check-In Information Photo ID Required (State/ Out of State/ DOC) - if photo ID is not available, clients are required to have a printout of a police/sheriff's criminal history report. Help out with chores around the Mission. No sex offender of any type (pending, charged, registered and/or any other sex related offenses) will be permitted to check in. Must be willing to abide by all rules, regulations, and policies established by the  [facility]. The following will be provided - shelter, food, clothing, and biblical counseling. If you or someone you know is in need of assistance at our men's shelter in , [place], please call [phone] ext. [phone].   [facility]-will provide timely access to mental health services for children and adolescents (4-17) and adults presenting in a mental health crisis. The program is designed for those who need urgent Behavioral Health or Substance Use treatment and are not experiencing a medical crisis that would typically require an emergency room visit.    [address] [place],  [address] Phone: [phone] [email or web address]   [facility]: Phone#: [phone]   The [facility] SA Intensive Outpatient Program (SAIOP) means structured individual and group addiction activities and services that are provided at an outpatient program designed to assist adult and adolescent consumers to begin recovery and learn skills for recovery maintenance. The [facility] SAIOP program is offered at   least 3 hours a  day, 3 days a week.SAIOP services shall include a structured program consisting of, but not limited to, the following services: Individual counseling and support; Group counseling and support; Family counseling, training or support; Biochemical assays to identify recent drug use (e.g., urine drug screens); Strategies for relapse prevention to include community and social support systems in treatment; Life skills; Crisis contingency planning; Disease Management; and Treatment support activities that have been adapted or specifically designed for persons with physical disabilities, or persons with co-occurring disorders of mental illness and substance abuse/dependence or mental retardation/developmental disability and substance abuse/dependence. Phone: [phone]     [facility] 24-Hour Call Center: 1-800-256-2452  Behavioral Health Crisis Line: 1-833-600-2054       

## 2020-11-09 NOTE — ED Provider Notes (Signed)
Behavioral Health Urgent Care Medical Screening Exam  Patient Name: Brett Salinas MRN: [id number] Date of Evaluation: 11/09/20 Chief Complaint:   Diagnosis:  Final diagnoses:  None    History of Present illness: Brett Salinas is a 50 y.o. malepatient presented to [facility] as a walk in accompanied by 50 yr old male friend other (my old lady) with complaints of wanting to get on medications for auditory hallucinations and substance use services.   Brett Salinas, 50 y.o., male patient seen face to face by this provider, consulted with Dr. Earlene Plater; and chart reviewed on 11/09/20.  On evaluation Brett Salinas reports he was brought here by his old lady to get help getting back on his medications.  Patient reports he went to family services but no one was able to see him today and he was sent here.  Patient reports he has a mental health diagnosis of bipolar schizoaffective disorder and was receiving medications from [facility].  Reports he has been off his medications for over a year and it has been over a year since he has been to [place].  Patient reports he has been having auditory hallucinations and the hallucinations are worse with drug use.  Patient reporting he uses cocaine daily if he is able to buy it.  Patient reports he is unemployed but his older lady supports his habit.  Patient reports he has also had several psychiatric hospitalizations and has been to rehab several times.  Reports the main reason he is here today is because his older lady cannot handle how he acts when he is high.  Patient denies suicidal/self-harm/homicidal ideation.  However he does endorse some paranoia that is also worse than when he is high.  Discussed patient staying overnight to be started on medications and having social work to assist with rehab facilities but patient states he is not willing to stay tonight and that he only wants to be put on medications and set up with  outpatient psychiatric services.  Patient informed We could restart medications today as if he was staying over to make sure no adverse reactions patient declined to stay.  Informed would attach resources for outpatient psychiatric services and for substance use services to his discharge papers. During evaluation Brett Salinas is sitting upright in chair in no acute distress.  He is alert/oriented x 4; calm/cooperative; and mood congruent with affect.  He is speaking in a clear tone at moderate volume, and normal pace; with good eye contact.  His thought process is coherent and relevant; There is no indication that he is currently responding to internal/external stimuli or experiencing delusional thought content; and he has denied suicidal/self-harm/homicidal ideation, psychosis, and paranoia.   Patient has remained calm throughout assessment and has answered questions appropriately.    At this time Brett Salinas is educated and verbalizes understanding of mental health resources and other crisis services in the community. He is instructed to call 911 and present to the nearest emergency room should he experience any suicidal/homicidal ideation, auditory/visual/hallucinations, or detrimental worsening of his mental health condition.    Psychiatric Specialty Exam  Presentation  General Appearance:Appropriate for Environment  Eye Contact:Fair  Speech:Clear and Coherent; Normal Rate  Speech Volume:Normal  Handedness:Right   Mood and Affect  Mood:Euthymic  Affect:Congruent   Thought Process  Thought Processes:Coherent; Goal Directed  Descriptions of Associations:Intact  Orientation:Full (Time, Place and Person)  Thought Content:WDL  Diagnosis of Schizophrenia or Schizoaffective disorder in past: No  Duration of Psychotic Symptoms: Greater than six months  Hallucinations:Auditory Patient states he is hearing voices telling him to "go left or go right, somebody is  shooting at you,"  Ideas of Reference:Paranoia (That someone is after him mainly when he is high)  Suicidal Thoughts:No  Homicidal Thoughts:No   Sensorium  Memory:Immediate Good; Recent Good; Remote Good  Judgment:Good  Insight:Present; Shallow   Executive Functions  Concentration:Good  Attention Span:Good  Recall:Good  Fund of Knowledge:Fair; Good  Language:Good   Psychomotor Activity  Psychomotor Activity:Restlessness   Assets  Assets:Communication Skills; Desire for Improvement; Social Support   Sleep  Sleep:Good  Number of hours:  No data recorded  Nutritional Assessment (For OBS and FBC admissions only) Has the patient had a weight loss or gain of 10 pounds or more in the last 3 months?: No Has the patient had a decrease in food intake/or appetite?: No Does the patient have dental problems?: No Does the patient have eating habits or behaviors that may be indicators of an eating disorder including binging or inducing vomiting?: No Has the patient recently lost weight without trying?: 0 Has the patient been eating poorly because of a decreased appetite?: 0 Malnutrition Screening Tool Score: 0    Physical Exam: Physical Exam Vitals and nursing note reviewed. Exam conducted with a chaperone present.  Constitutional:      General: He is not in acute distress.    Appearance: Normal appearance. He is not ill-appearing.  Cardiovascular:     Rate and Rhythm: Normal rate.  Pulmonary:     Effort: Pulmonary effort is normal.  Musculoskeletal:        General: Normal range of motion.     Cervical back: Normal range of motion.  Skin:    General: Skin is warm and dry.  Neurological:     Mental Status: He is alert and oriented to person, place, and time.  Psychiatric:        Attention and Perception: Attention and perception normal. Auditory hallucinations: Reporting auditory hallucinations.        Mood and Affect: Mood and affect normal.        Speech:  Speech normal.        Behavior: Behavior normal. Hyperactive: restless. Behavior is cooperative.        Thought Content: Thought content normal. Thought content is not delusional. Paranoid: Reporting paranoia that is worsened when he is high.Thought content does not include homicidal or suicidal ideation.        Cognition and Memory: Cognition and memory normal.        Judgment: Judgment is impulsive.   Review of Systems  Constitutional: Negative.   HENT: Negative.    Eyes: Negative.   Respiratory: Negative.    Cardiovascular: Negative.   Gastrointestinal: Negative.   Genitourinary: Negative.   Musculoskeletal: Negative.   Skin: Negative.   Neurological: Negative.   Endo/Heme/Allergies: Negative.   Psychiatric/Behavioral:  Negative for memory loss. Depression: Stable. Hallucinations: Reports he hearing voices that are worsened when he is high. Substance abuse: Cocaine. Suicidal ideas: Denies.Nervous/anxious: Stable.        Patient states that he is wanting to get back on his medications for schizoaffective disorder bipolar type but is unwilling to stay overnight to be started on medications.  Patient states he wants substance use services but states he has been to rehab in past and didn't work only wanting outpatient services.     Blood pressure (!) 149/101, pulse 93, temperature 98.5 F (36.9 C),  temperature source Oral, resp. rate 18, SpO2 100 %. There is no height or weight on file to calculate BMI.  Musculoskeletal: Strength & Muscle Tone: within normal limits Gait & Station: normal Patient leans: N/A   [facility] MSE Discharge Disposition for Follow up and Recommendations: Based on my evaluation the patient does not appear to have an emergency medical condition and can be discharged with resources and follow up care in outpatient services for Medication Management, Partial Hospitalization Program, Substance Abuse Intensive Outpatient Program, Individual Therapy, Group Therapy, and  Intensive Outpatient Services   Follow-up Information     [facility] .   Specialty: Urgent Care Why: New Therapy walkin:  Monday-Wednesday from 7:30am-12:30pm.  New Medication management walkin Monday-Friday from 7:30 am to 11:00am.  Patient will be taken in the order that they come.  You may not be seen on the same day as walkin. first come first Designer, jewellery information: [address] [place] [address] [phone]        [facility] [employer]. Call .   Specialty: Professional Counselor Why: schedule an appointment for medication management and therapy Contact information: [employer] [facility] the [place] [address] [place] [address] [phone]         [facility] .   Why: Walk in hours Monday thru Thursday 8:00 AM to 3:00 PM first come first serve Contact information: [address] [place] [address] [phone]         Services, [facility] .   Why: Admissions are currently completed Monday through Friday at 8am; both appointments and walk-ins are accepted.  Any individual that is a [facility] resident may present for a substance abuse screening and assessment for admission Contact information: [address] [place] [address] [phone]         Inc, [address] [facility] .   Why: Admissions are currently completed Monday through Friday at 8am; both appointments and walk-ins are accepted. Any individual that is a [facility] resident may present for a substance abuse screening and assessment for admission Contact information: [address] [place] [address] [phone]         Inc, [facility] .   Why: Admissions are currently completed Monday through Friday at 8am; both appointments and walk-ins are accepted. Any individual that is a [facility] resident may present for a substance abuse screening and assessment for  admission Contact information: [address] [place] [address] [phone]                  Discharge Instructions      Substance Abuse Resources  [facility] - Admissions are currently completed Monday through Friday at 8am; both appointments and walk-ins are accepted.  Any individual that is a [facility] resident may present for a substance abuse screening and assessment for admission.  A person may be referred by numerous sources or self-refer.   Potential clients will be screened for medical necessity and appropriateness for the program.  Clients must meet criteria for high-intensity residential treatment services.  If clinically appropriate, a client will continue with the comprehensive clinical assessment and intake process, as well as enrollment in the [facility].   Address: [address] [place], [place] [address] Admin Hours: Mon-Fri 8AM to [facility] Hours: 24/7 Phone: [phone] Fax: [phone]   [facility] (Detox) Facility Based Crisis:  These are 3  locations for services: Please call before arrival    Address: [address] [place], [place] [address] Phone: [phone]   Address: [address] [place], [place] [address] Phone#: [phone]   Address: [address] [place], [place] [address] Phone#: [phone]     [facility] ([facility]): (offers outpatient therapy and intensive outpatient substance abuse therapy).  [address], [place], [place] [address] Phone: [phone]   [facility]: Offers FREE recovery skills classes, support groups, 1:1 Peer Support, and Compeer Classes. [address] [place], [place] [address] Phone: [phone] (Call to complete intake).  [facility] [address] [place], [place] [address] Phone: [phone] ext: [phone] The [facility] provides food, shelter and other programs  and services to the homeless men of [place] through our [employer].   By offering safe shelter, three meals a day, clean clothing, Biblical counseling, financial planning, vocational training, GED/education and employment assistance, we've helped mend the shattered lives of many homeless men since opening in 1974.   We have approximately 267 beds available, with a max of 312 beds including mats for emergency situations and currently house an average of 270 men a night.   Prospective Client Check-In Information Photo ID Required (State/ Out of State/ [facility]) - if photo ID is not available, clients are required to have a printout of a police/sheriff's criminal history report. Help out with chores around the [facility]. No sex offender of any type (pending, charged, registered and/or any other sex related offenses) will be permitted to check in. Must be willing to abide by all rules, regulations, and policies established by the [employer]. The following will be provided - shelter, food, clothing, and biblical counseling. If you or someone you know is in need of assistance at our [facility] shelter in [place], [place], please call [phone] ext. [phone].   [facility] [facility]-will provide timely access to mental health services for children and adolescents (4-17) and adults presenting in a mental health crisis. The program is designed for those who need urgent Behavioral Health or Substance Use treatment and are not experiencing a medical crisis that would typically require an emergency room visit.    [address] [place], [place] [address] Phone: [phone] [email or web address]   [facility]: Phone#: [phone]   The [facility] Intensive Outpatient Program (SAIOP) means structured individual and group addiction activities and services that are provided at an outpatient program designed to assist adult and  adolescent consumers to begin recovery and learn skills for recovery maintenance. The [facility] [facility] SAIOP program is offered at least 3 hours a day, 3 days a week.SAIOP services shall include a structured program consisting of, but not limited to, the following services: Individual counseling and support; Group counseling and support; Family counseling, training or support; Biochemical assays to identify recent drug use (e.g., urine drug screens); Strategies for relapse prevention to include community and social support systems in treatment; Life skills; Crisis contingency planning; Disease Management; and Treatment support activities that have been adapted or specifically designed for persons with physical disabilities, or persons with co-occurring disorders of mental illness and substance abuse/dependence or mental retardation/developmental disability and substance abuse/dependence. Phone: [phone]     [facility] 24-Hour Call Center: [phone]  Behavioral Health Crisis Line: [phone]          Handouts with resources also given.  Rollyn Scialdone, NP 11/09/2020, 3:01 PM

## 2020-11-09 NOTE — Discharge Summary (Signed)
Brett Salinas [facility] to be D/C'd Home per NP order. Discussed with the patient and all questions fully answered. An After Visit Summary was printed and given to the patient. Patient escorted out and D/C home via private auto.  Dickie La  11/09/2020 3:42 PM

## 2020-11-09 NOTE — BH Assessment (Signed)
Pt reports drug addiction and "psychosis" and is requesting medications and outpatient substance abuse services. Pt reports going to [facility] for services but no one was able to see him today. Pt reports using cocaine daily and appears intoxicated. Pt reports paranoia and hallucinations when he is using drugs. Pt denies SI, HI and AVH currently. Pt does not appear to be responding to internal/external stimuli.   Pt is routine.

## 2020-11-11 ENCOUNTER — Telehealth (HOSPITAL_COMMUNITY): Payer: Self-pay

## 2020-11-11 NOTE — BH Assessment (Signed)
Care Management - Follow Up [facility] Discharges   Writer attempted to make contact with patient today and was unsuccessful.  Writer left a HIPPA compliant voice message.   

## 2020-11-22 ENCOUNTER — Emergency Department (HOSPITAL_COMMUNITY)
Admission: EM | Admit: 2020-11-22 | Discharge: 2020-11-22 | Disposition: A | Discharge status: Home / Self Care | Payer: Self-pay | Attending: Emergency Medicine | Admitting: Emergency Medicine

## 2020-11-22 ENCOUNTER — Other Ambulatory Visit: Payer: Self-pay

## 2020-11-22 DIAGNOSIS — J3489 Other specified disorders of nose and nasal sinuses: Secondary | ICD-10-CM | POA: Insufficient documentation

## 2020-11-22 DIAGNOSIS — J449 Chronic obstructive pulmonary disease, unspecified: Secondary | ICD-10-CM | POA: Insufficient documentation

## 2020-11-22 DIAGNOSIS — I110 Hypertensive heart disease with heart failure: Secondary | ICD-10-CM | POA: Insufficient documentation

## 2020-11-22 DIAGNOSIS — I2510 Atherosclerotic heart disease of native coronary artery without angina pectoris: Secondary | ICD-10-CM | POA: Insufficient documentation

## 2020-11-22 DIAGNOSIS — B351 Tinea unguium: Secondary | ICD-10-CM | POA: Insufficient documentation

## 2020-11-22 DIAGNOSIS — I5033 Acute on chronic diastolic (congestive) heart failure: Secondary | ICD-10-CM | POA: Insufficient documentation

## 2020-11-22 MED ORDER — DOXYCYCLINE HYCLATE 100 MG PO CAPS: 100.0000 mg | ORAL_CAPSULE | Freq: Two times a day (BID) | ORAL | 0 refills | Status: AC

## 2020-11-22 MED ORDER — MUPIROCIN CALCIUM 2 % EX CREA: 1.0000 "application " | TOPICAL_CREAM | Freq: Two times a day (BID) | CUTANEOUS | 0 refills | Status: AC

## 2020-11-22 MED ORDER — CICLOPIROX 8 % EX SOLN: Freq: Every day | CUTANEOUS | 0 refills | Status: AC

## 2020-11-22 NOTE — ED Provider Notes (Signed)
[facility] EMERGENCY DEPARTMENT Provider Note   CSN: [id number] Arrival date & time: 11/22/20  1354     History No chief complaint on file.   Brett Salinas is a 50 y.o. male.  HPI  This patient is a 50 year old male, he has multiple medical problems but pertinent to this evaluation is a history of schizoaffective disorder, history of substance abuse, presents with a complaint of nasal discomfort as well as ear discomfort and toenail fungus.  He has had toenail fungus for quite some time, it continues to get worse and all of his nails are blackened and thickened.  They do not hurt but he picks at them constantly.  He also thinks that he has parasites in his nose and his ear and feels like he is seeing black spiders and bugs crawling out of his nose and his ear.  Past Medical History:  Diagnosis Date   Achilles tendon rupture-surgery 09/19/15 09/19/2015   Altered mental status 12/23/2016   Arthritis    "hips, knees, back" (10/13/2015)   Bacterial conjunctivitis of right eye 2018   Bipolar disorder (HCC)    CAD -S/P PCI LAD/DES 10/13/15 10/14/2015   CHF (congestive heart failure) (HCC)    Cocaine abuse-drug sceen positive, History of 10/14/2015   Coronary artery disease    Depression    DVT (deep venous thrombosis) (HCC) 09/2015   right   Essential hypertension 11/30/2015   Heart murmur    High cholesterol    Hypertension    Myocardial infarction (HCC) 10/12/2015 X 2-3   NSTEMI (non-ST elevated myocardial infarction) (HCC)    Osteoarthritis    Rectal myiasis    Schizoaffective disorder, bipolar type (HCC) 10/14/2015   Schizoaffective disorder, bipolar type [facility])    Sciatica     Patient Active Problem List   Diagnosis Date Noted   Substance induced mood disorder (HCC) 11/09/2020   Acute on chronic diastolic CHF (congestive heart failure), NYHA class 3 (HCC) 02/11/2020   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   Cocaine use with  cocaine-induced mood disorder (HCC) 07/02/2019   Homelessness 07/02/2019   Hallucinations    Polysubstance abuse (HCC)    AKI (acute kidney injury) (HCC) 09/25/2018   Tobacco use disorder 12/29/2016   COPD (chronic obstructive pulmonary disease) (HCC) 12/28/2016   Dyslipidemia 12/28/2016   Amphetamine use disorder, 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   Drug abuse (HCC)    Agitation 12/24/2016   Aggression 12/24/2016   Altered mental status 12/23/2016   History of ED visit for Medical clearance for incarceration for alleged aggressive behavior and crack cocaine use 09/05/2016   Osteoarthritis 07/20/2016   Essential hypertension 11/30/2015   CAD -S/P PCI LAD/DES 10/13/15 10/14/2015   Wound infection- for I&D achilles wound 10/14/15 10/14/2015   Cocaine abuse-drug sceen positive, History of 10/14/2015   Schizoaffective disorder, bipolar type (HCC) 10/14/2015   Infection 10/14/2015   NSTEMI (non-ST elevated myocardial infarction) [facility])    Chest pain 10/12/2015   Achilles tendon rupture-surgery 09/19/15 09/19/2015    Past Surgical History:  Procedure Laterality Date   CARDIAC CATHETERIZATION N/A 10/13/2015   Procedure: Left Heart Cath and Coronary Angiography;  Surgeon: Corky Crafts, MD;  Location: [facility] INVASIVE CV LAB;  Service: Cardiovascular;  Laterality: N/A;   CARDIAC CATHETERIZATION N/A 10/13/2015   Procedure: Coronary Stent Intervention;  Surgeon: Corky Crafts, MD;  Location: [facility] INVASIVE CV LAB;  Service: Cardiovascular;  Laterality: N/A;   CARDIAC CATHETERIZATION N/A 10/13/2015   Procedure: Intravascular Ultrasound/IVUS;  Surgeon: Corky Crafts, MD;  Location: [facility] INVASIVE CV LAB;  Service: Cardiovascular;  Laterality: N/A;   CORONARY ANGIOPLASTY WITH STENT PLACEMENT  10/13/2015   I & D EXTREMITY Right 09/19/2015   Procedure: IRRIGATION AND DEBRIDEMENT ANKLE LACERATIONS POSSIBLE TENDON REPAIR;  Surgeon: Cammy Copa, MD;  Location: [facility] OR;  Service: Orthopedics;  Laterality:  Right;   I & D EXTREMITY Right 10/14/2015   Procedure: IRRIGATION AND DEBRIDEMENT EXTREMITY/Right Ankle;  Surgeon: Cammy Copa, MD;  Location: [facility] OR;  Service: Orthopedics;  Laterality: Right;       No family history on file.  Social History   Tobacco Use   Smoking status: Never   Smokeless tobacco: Never  Substance Use Topics   Alcohol use: Yes   Drug use: Yes    Types: Cocaine, Marijuana    Comment: 10/13/2015 "I've tried alot; I don't have any habits"    Home Medications Prior to Admission medications   Medication Sig Start Date End Date Taking? Authorizing Provider  ciclopirox (PENLAC) 8 % solution Apply topically at bedtime. Apply over nail and surrounding skin. Apply daily over previous coat. After seven (7) days, may remove with alcohol and continue cycle. 11/22/20  Yes Eber Hong, MD  doxycycline (VIBRAMYCIN) 100 MG capsule Take 1 capsule (100 mg total) by mouth 2 (two) times daily for 7 days. 11/22/20 11/29/20 Yes Eber Hong, MD  mupirocin cream (BACTROBAN) 2 % Apply 1 application topically 2 (two) times daily. 11/22/20  Yes Eber Hong, MD  ARIPiprazole ER (ABILIFY MAINTENA) 400 MG SRER injection Inject 2 mLs (400 mg total) into the muscle every 28 (twenty-eight) days. Due 5/1 Patient not taking: Reported on 02/11/2020 05/06/19   Malvin Johns, MD  atorvastatin (LIPITOR) 40 MG tablet Take 1 tablet (40 mg total) by mouth daily. Patient not taking: No sig reported 03/11/19   Lars Masson, MD  gabapentin (NEURONTIN) 300 MG capsule Take 300 mg by mouth 3 (three) times daily.    [provider]  losartan (COZAAR) 50 MG tablet Take 50 mg by mouth at bedtime.    [provider]  meloxicam (MOBIC) 15 MG tablet Take 15 mg by mouth daily.    [provider]  OLANZapine (ZYPREXA) 15 MG tablet Take 15 mg by mouth at bedtime.    [provider]    Allergies    Catfish [fish allergy], Fish allergy, Heparin, Pork-derived products,  Heparin, and Pork-derived products  Review of Systems   Review of Systems  Constitutional:  Negative for fever.  Skin:  Positive for rash.   Physical Exam Updated Vital Signs BP (!) 150/95 (BP Location: Left Arm)   Pulse 86   Temp 98.3 F (36.8 C) (Oral)   Resp 14   SpO2 99%   Physical Exam Vitals and nursing note reviewed.  Constitutional:      Appearance: He is well-developed. He is not diaphoretic.  HENT:     Head: Normocephalic and atraumatic.     Ears:     Comments: Tympanic membrane's visualized, external auditory canals bilaterally normal    Nose:     Comments: Vestibulitis of the R nare with purulent material and mild ttp.  Clear airway. Eyes:     General:        Right eye: No discharge.        Left eye: No discharge.     Conjunctiva/sclera: Conjunctivae normal.  Cardiovascular:     Rate and Rhythm: Normal rate.  Pulmonary:  Effort: Pulmonary effort is normal. No respiratory distress.  Musculoskeletal:     Comments: Fairly advanced bilateral onychomycosis of the entire 10 toes.  No signs of surrounding inflammation or redness or infection  Skin:    General: Skin is warm and dry.     Findings: No erythema or rash.  Neurological:     Mental Status: He is alert.     Coordination: Coordination normal.    ED Results / Procedures / Treatments   Labs (all labs ordered are listed, but only abnormal results are displayed) Labs Reviewed - No data to display  EKG None  Radiology No results found.  Procedures Procedures   Medications Ordered in ED Medications - No data to display  ED Course  I have reviewed the triage vital signs and the nursing notes.  Pertinent labs & imaging results that were available during my care of the patient were reviewed by me and considered in my medical decision making (see chart for details).    MDM Rules/Calculators/A&P                           The patient is somewhat adamant about having treatments for all of  these conditions, I think treating for nasal vestibulitis is totally reasonable and thus he will be prescribed mupirocin and doxycycline.  I told him that the advanced nature of his toenails was going to be prohibitive of any treatments working however he was adamant about wanting something so topical ciclopirox will be added as well.  He is stable for discharge and has no other concerns or questions at this time  Final Clinical Impression(s) / ED Diagnoses Final diagnoses:  Nasal vestibulitis  Onychomycosis    Rx / DC Orders ED Discharge Orders          Ordered    doxycycline (VIBRAMYCIN) 100 MG capsule  2 times daily        11/22/20 1749    mupirocin cream (BACTROBAN) 2 %  2 times daily        11/22/20 1749    ciclopirox (PENLAC) 8 % solution  Daily at bedtime        11/22/20 1749             Eber Hong, MD 11/22/20 1753

## 2020-11-22 NOTE — Discharge Instructions (Signed)
Follow-up with your family doctor for recheck within the next week, you will need to apply the mupirocin with a Q-tip on the inside of your nostril and take the doxycycline by mouth twice a day to help treat the infection in your nose.  You can apply the ciclopirox to your nails though with the degree of infection that you have in your nails you have a very small chance of this ever getting better.  Your family doctor can refer you to a podiatrist as needed

## 2020-11-22 NOTE — ED Triage Notes (Signed)
Pt c/o "parasite infection" to nose & ear, states he was at motel recently & noticed after. No parasites noted in either area. Hx schizoaffective/bipolar, substance-induced mood disorder

## 2021-01-01 ENCOUNTER — Emergency Department (HOSPITAL_COMMUNITY)
Admission: EM | Admit: 2021-01-01 | Discharge: 2021-01-04 | Disposition: A | Discharge status: Home / Self Care | Payer: Self-pay | Attending: Emergency Medicine | Admitting: Emergency Medicine

## 2021-01-01 ENCOUNTER — Ambulatory Visit (HOSPITAL_COMMUNITY)
Admission: EM | Admit: 2021-01-01 | Discharge: 2021-01-01 | Disposition: A | Discharge status: Home / Self Care | Payer: No Payment, Other | Attending: Psychiatry | Admitting: Psychiatry

## 2021-01-01 ENCOUNTER — Other Ambulatory Visit: Payer: Self-pay

## 2021-01-01 DIAGNOSIS — F200 Paranoid schizophrenia: Secondary | ICD-10-CM

## 2021-01-01 DIAGNOSIS — Y900 Blood alcohol level of less than 20 mg/100 ml: Secondary | ICD-10-CM | POA: Insufficient documentation

## 2021-01-01 DIAGNOSIS — I5033 Acute on chronic diastolic (congestive) heart failure: Secondary | ICD-10-CM | POA: Insufficient documentation

## 2021-01-01 DIAGNOSIS — Z20822 Contact with and (suspected) exposure to covid-19: Secondary | ICD-10-CM | POA: Insufficient documentation

## 2021-01-01 DIAGNOSIS — I110 Hypertensive heart disease with heart failure: Secondary | ICD-10-CM | POA: Insufficient documentation

## 2021-01-01 DIAGNOSIS — F209 Schizophrenia, unspecified: Secondary | ICD-10-CM | POA: Insufficient documentation

## 2021-01-01 DIAGNOSIS — F201 Disorganized schizophrenia: Secondary | ICD-10-CM | POA: Insufficient documentation

## 2021-01-01 DIAGNOSIS — Z79899 Other long term (current) drug therapy: Secondary | ICD-10-CM | POA: Insufficient documentation

## 2021-01-01 DIAGNOSIS — F1411 Cocaine abuse, in remission: Secondary | ICD-10-CM | POA: Insufficient documentation

## 2021-01-01 DIAGNOSIS — I2510 Atherosclerotic heart disease of native coronary artery without angina pectoris: Secondary | ICD-10-CM | POA: Insufficient documentation

## 2021-01-01 DIAGNOSIS — J449 Chronic obstructive pulmonary disease, unspecified: Secondary | ICD-10-CM | POA: Insufficient documentation

## 2021-01-01 LAB — ETHANOL: Alcohol, Ethyl (B): <10 mg/dL (ref <10)

## 2021-01-01 LAB — COMPREHENSIVE METABOLIC PANEL
ALT: 18 U/L (ref 0–44)
AST: 35 U/L (ref 15–41)
Albumin: 3.5 g/dL (ref 3.5–5.0)
Alkaline Phosphatase: 59 U/L (ref 38–126)
Anion gap: 6 (ref 5–15)
BUN: 19 mg/dL (ref 6–20)
CO2: 29 mmol/L (ref 22–32)
Calcium: 8.6 mg/dL — ABNORMAL LOW (ref 8.9–10.3)
Chloride: 103 mmol/L (ref 98–111)
Creatinine, Ser: 1.05 mg/dL (ref 0.61–1.24)
GFR, Estimated: >60 mL/min (ref >60)
Glucose, Bld: 85 mg/dL (ref 70–99)
Potassium: 3.4 mmol/L — ABNORMAL LOW (ref 3.5–5.1)
Sodium: 138 mmol/L (ref 135–145)
Total Bilirubin: 1 mg/dL (ref 0.3–1.2)
Total Protein: 6 g/dL — ABNORMAL LOW (ref 6.5–8.1)

## 2021-01-01 LAB — CBC WITH DIFFERENTIAL/PLATELET
Abs Immature Granulocytes: 0.01 10*3/uL (ref 0.00–0.07)
Basophils Absolute: 0 10*3/uL (ref 0.0–0.1)
Basophils Relative: 1 %
Eosinophils Absolute: 0.1 10*3/uL (ref 0.0–0.5)
Eosinophils Relative: 2 %
HCT: 37.4 % — ABNORMAL LOW (ref 39.0–52.0)
Hemoglobin: 12.6 g/dL — ABNORMAL LOW (ref 13.0–17.0)
Immature Granulocytes: 0 %
Lymphocytes Relative: 43 %
Lymphs Abs: 1.8 10*3/uL (ref 0.7–4.0)
MCH: 29.8 pg (ref 26.0–34.0)
MCHC: 33.7 g/dL (ref 30.0–36.0)
MCV: 88.4 fL (ref 80.0–100.0)
Monocytes Absolute: 0.3 10*3/uL (ref 0.1–1.0)
Monocytes Relative: 7 %
Neutro Abs: 2 10*3/uL (ref 1.7–7.7)
Neutrophils Relative %: 47 %
Platelets: 218 10*3/uL (ref 150–400)
RBC: 4.23 MIL/uL (ref 4.22–5.81)
RDW: 13 % (ref 11.5–15.5)
WBC: 4.2 10*3/uL (ref 4.0–10.5)
nRBC: 0 % (ref 0.0–0.2)

## 2021-01-01 LAB — RESP PANEL BY RT-PCR (FLU A&B, COVID) ARPGX2
Influenza A by PCR: NEGATIVE
Influenza B by PCR: NEGATIVE
SARS Coronavirus 2 by RT PCR: NEGATIVE

## 2021-01-01 MED ORDER — ZIPRASIDONE MESYLATE 20 MG IM SOLR
20.0000 mg | Freq: Once | INTRAMUSCULAR | Status: AC
  Administered 2021-01-01: 20 mg via INTRAMUSCULAR
  Filled 2021-01-01: qty 20

## 2021-01-01 MED ORDER — STERILE WATER FOR INJECTION IJ SOLN
INTRAMUSCULAR | Status: AC
  Administered 2021-01-01: 1.2 mL
  Filled 2021-01-01: qty 10

## 2021-01-01 NOTE — ED Notes (Signed)
IVC PAPERWORK GIVEN TO THE NURSE BRENDA

## 2021-01-01 NOTE — ED Notes (Signed)
Pt unable to be redirected to sit still for vitals at this time.  Respirations 20.  Will continue to monitor for safety.
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
## 2021-01-01 NOTE — BH Assessment (Signed)
Pt is unable to fully participate in triage. Pt appears psychotic with rambling pressured speech. Pt makes several statements about camera watching him and locating devices in his eyes and finger. Pt also possibly under the influence of drugs. Pt is very agitated pacing in the room and not responding accurately to TTS questions.   Pt is emergent

## 2021-01-01 NOTE — ED Triage Notes (Addendum)
Pt IVC'd brought in by [employer] from [facility]. Pt not making sense with his conversation. Pt actively tweaking at this time. Per [employer] they went to his house and found syringes all over the floor along with trash and etc. Pt not taking care of himself. Pt states he needs to talk to his wife that we need to get his wife, per [employer] he has no wife.

## 2021-01-01 NOTE — ED Notes (Signed)
Pt refuses to sit still to allow staff to get vitals. This RN was only able to get a HR and pulse. This RN will continue to monitor.

## 2021-01-01 NOTE — ED Provider Notes (Signed)
  Physical Exam  Pulse 68   SpO2 92%   Physical Exam  ED Course/Procedures     Procedures  MDM   Patient had come in with chief complaint of psychotic breakdown.  Patient is noted to be responding to internal stimuli.  He is noted to have aggressive posture.  Requiring multiple staff to try and calm him down.  Security at the bedside.  In my judgment, our staff will need to get COVID swab and patient himself are likely at higher risk if we do not intervene.  IM Geodon ordered.  Labs have been reviewed, they look fine. Patient has remained calm after receiving IM Geodon.         Derwood Kaplan, MD 01/01/21 [phone]

## 2021-01-01 NOTE — ED Provider Notes (Signed)
[facility] EMERGENCY DEPARTMENT Provider Note   CSN: [id number] Arrival date & time: 01/01/21  1340     History Chief Complaint  Patient presents with   Psychiatric Evaluation    Brett Salinas is a 50 y.o. male.  50 yo M chief complaints of psychosis.  The patient has a history of the same and was taken to behavioral health sent here for medical clearance.  Patient denies any medical complaint.  Perseverates that the whole system is out to get him and his trying to kill him.  Tells me that he is bleeding to death and no one will help take care of him. Level V caveat, AMS.   The history is provided by the patient.  Illness Severity:  Moderate Onset quality:  Gradual Duration:  2 weeks Timing:  Constant Progression:  Worsening Chronicity:  Recurrent Associated symptoms: no abdominal pain, no chest pain, no congestion, no diarrhea, no fever, no headaches, no myalgias, no rash, no shortness of breath and no vomiting       Past Medical History:  Diagnosis Date   Achilles tendon rupture-surgery 09/19/15 09/19/2015   Altered mental status 12/23/2016   Arthritis    "hips, knees, back" (10/13/2015)   Bacterial conjunctivitis of right eye 2018   Bipolar disorder ([place])    CAD -S/P PCI LAD/DES 10/13/15 10/14/2015   CHF (congestive heart failure) (HCC)    Cocaine abuse-drug sceen positive, History of 10/14/2015   Coronary artery disease    Depression    DVT (deep venous thrombosis) ([place]) 09/2015   right   Essential hypertension 11/30/2015   Heart murmur    High cholesterol    Hypertension    Myocardial infarction ([place]) 10/12/2015 X 2-3   NSTEMI (non-ST elevated myocardial infarction) ([place])    Osteoarthritis    Rectal myiasis    Schizoaffective disorder, bipolar type ([place]) 10/14/2015   Schizoaffective disorder, bipolar type [facility])    Sciatica     Patient Active Problem List   Diagnosis Date Noted   Substance induced mood disorder ([place]) 11/09/2020    Acute on chronic diastolic CHF (congestive heart failure), NYHA class 3 ([place]) 02/11/2020   Psychoactive substance-induced psychosis ([place]) 02/11/2020   Cocaine use with cocaine-induced mood disorder ([place]) 07/02/2019   Homelessness 07/02/2019   Hallucinations    Polysubstance abuse ([place])    AKI (acute kidney injury) ([place]) 09/25/2018   Tobacco use disorder 12/29/2016   COPD (chronic obstructive pulmonary disease) ([place]) 12/28/2016   Dyslipidemia 12/28/2016   Amphetamine use disorder, severe ([place]) 12/28/2016   Drug abuse ([place])    Agitation 12/24/2016   Aggression 12/24/2016   Altered mental status 12/23/2016   History of ED visit for Medical clearance for incarceration for alleged aggressive behavior and crack cocaine use 09/05/2016   Osteoarthritis 07/20/2016   Essential hypertension 11/30/2015   CAD -S/P PCI LAD/DES 10/13/15 10/14/2015   Wound infection- for I&D achilles wound 10/14/15 10/14/2015   Cocaine abuse-drug sceen positive, History of 10/14/2015   Schizoaffective disorder, bipolar type ([place]) 10/14/2015   Infection 10/14/2015   NSTEMI (non-ST elevated myocardial infarction) [facility])    Chest pain 10/12/2015   Achilles tendon rupture-surgery 09/19/15 09/19/2015    Past Surgical History:  Procedure Laterality Date   CARDIAC CATHETERIZATION N/A 10/13/2015   Procedure: Left Heart Cath and Coronary Angiography;  Surgeon: Jettie Booze, MD;  Location: [facility] CV LAB;  Service: Cardiovascular;  Laterality: N/A;   CARDIAC CATHETERIZATION N/A 10/13/2015   Procedure: Coronary  Stent Intervention;  Surgeon: Corky Crafts, MD;  Location: [facility] INVASIVE CV LAB;  Service: Cardiovascular;  Laterality: N/A;   CARDIAC CATHETERIZATION N/A 10/13/2015   Procedure: Intravascular Ultrasound/IVUS;  Surgeon: Corky Crafts, MD;  Location: [facility] INVASIVE CV LAB;  Service: Cardiovascular;  Laterality: N/A;   CORONARY ANGIOPLASTY WITH STENT PLACEMENT  10/13/2015   I & D EXTREMITY Right 09/19/2015    Procedure: IRRIGATION AND DEBRIDEMENT ANKLE LACERATIONS POSSIBLE TENDON REPAIR;  Surgeon: Cammy Copa, MD;  Location: [facility] OR;  Service: Orthopedics;  Laterality: Right;   I & D EXTREMITY Right 10/14/2015   Procedure: IRRIGATION AND DEBRIDEMENT EXTREMITY/Right Ankle;  Surgeon: Cammy Copa, MD;  Location: [facility] OR;  Service: Orthopedics;  Laterality: Right;       No family history on file.  Social History   Tobacco Use   Smoking status: Never   Smokeless tobacco: Never  Substance Use Topics   Alcohol use: Yes   Drug use: Yes    Types: Cocaine, Marijuana    Comment: 10/13/2015 "I've tried alot; I don't have any habits"    Home Medications Prior to Admission medications   Medication Sig Start Date End Date Taking? Authorizing Provider  ARIPiprazole ER (ABILIFY MAINTENA) 400 MG SRER injection Inject 2 mLs (400 mg total) into the muscle every 28 (twenty-eight) days. Due 5/1 Patient not taking: Reported on 02/11/2020 05/06/19   Malvin Johns, MD  atorvastatin (LIPITOR) 40 MG tablet Take 1 tablet (40 mg total) by mouth daily. Patient not taking: Reported on 05/02/2019 03/11/19   Lars Masson, MD  ciclopirox [facility]) 8 % solution Apply topically at bedtime. Apply over nail and surrounding skin. Apply daily over previous coat. After seven (7) days, may remove with alcohol and continue cycle. 11/22/20   Eber Hong, MD  gabapentin (NEURONTIN) 300 MG capsule Take 300 mg by mouth 3 (three) times daily.    [provider]  losartan (COZAAR) 50 MG tablet Take 50 mg by mouth at bedtime.    [provider]  meloxicam (MOBIC) 15 MG tablet Take 15 mg by mouth daily.    [provider]  mupirocin cream (BACTROBAN) 2 % Apply 1 application topically 2 (two) times daily. 11/22/20   Eber Hong, MD  OLANZapine (ZYPREXA) 15 MG tablet Take 15 mg by mouth at bedtime.    [provider]    Allergies    Catfish [fish allergy], Fish allergy, Heparin,  Pork-derived products, Heparin, and Pork-derived products  Review of Systems   Review of Systems  Unable to perform ROS: Psychiatric disorder  Constitutional:  Positive for activity change. Negative for chills and fever.  HENT:  Negative for congestion and facial swelling.   Eyes:  Negative for discharge and visual disturbance.  Respiratory:  Negative for shortness of breath.   Cardiovascular:  Negative for chest pain and palpitations.  Gastrointestinal:  Negative for abdominal pain, diarrhea and vomiting.  Musculoskeletal:  Negative for arthralgias and myalgias.  Skin:  Negative for color change and rash.  Neurological:  Negative for tremors, syncope and headaches.  Psychiatric/Behavioral:  Negative for confusion and dysphoric mood.    Physical Exam Updated Vital Signs Pulse 68   SpO2 92%   Physical Exam Vitals and nursing note reviewed.  Constitutional:      Appearance: He is well-developed.  HENT:     Head: Normocephalic and atraumatic.  Eyes:     Pupils: Pupils are equal, round, and reactive to light.  Neck:  Vascular: No JVD.  Cardiovascular:     Rate and Rhythm: Normal rate and regular rhythm.     Heart sounds: No murmur heard.   No friction rub. No gallop.  Pulmonary:     Effort: No respiratory distress.     Breath sounds: No wheezing.  Abdominal:     General: There is no distension.     Tenderness: There is no abdominal tenderness. There is no guarding or rebound.  Musculoskeletal:        General: Normal range of motion.     Cervical back: Normal range of motion and neck supple.  Skin:    Coloration: Skin is not pale.     Findings: No rash.  Neurological:     Mental Status: He is alert.  Psychiatric:        Behavior: Behavior normal.    ED Results / Procedures / Treatments   Labs (all labs ordered are listed, but only abnormal results are displayed) Labs Reviewed  RESP PANEL BY RT-PCR (FLU A&B, COVID) ARPGX2    EKG None  Radiology No results  found.  Procedures Procedures   Medications Ordered in ED Medications - No data to display  ED Course  I have reviewed the triage vital signs and the nursing notes.  Pertinent labs & imaging results that were available during my care of the patient were reviewed by me and considered in my medical decision making (see chart for details).    MDM Rules/Calculators/A&P                           50 yo M with a cc of psychosis.  Hx of same.  Medically clear.  TTS eval.   Feel no need for labs.   The patients results and plan were reviewed and discussed.   Any x-rays performed were independently reviewed by myself.   Differential diagnosis were considered with the presenting HPI.  Medications - No data to display  Vitals:   01/01/21 1407  Pulse: 68  SpO2: 92%    Final diagnoses:  Paranoid schizophrenia ([place])    Admission/ observation were discussed with the admitting physician, patient and/or family and they are comfortable with the plan.   Final Clinical Impression(s) / ED Diagnoses Final diagnoses:  Paranoid schizophrenia [facility])    Rx / DC Orders ED Discharge Orders     None        Deno Etienne, DO 01/01/21 1509

## 2021-01-01 NOTE — Discharge Instructions (Signed)
Transfer to [facility]  

## 2021-01-01 NOTE — ED Notes (Signed)
Pt changed into purple scrubs 

## 2021-01-01 NOTE — BH Assessment (Signed)
Per Elisha Headland. Verne Carrow, RN pt received Geodon and is sleeping. Per RN, pt "he barely opens his eyes and keeps repeating wait, they connected my neck to blood".  Clinician asked RN to let her know when the pt is more alert and able to engage in assessment.   Redmond Pulling, MS, [facility], [facility] Triage Specialist [phone]

## 2021-01-01 NOTE — BH Assessment (Signed)
Clinician messaged Elisha Headland. Verne Carrow, RN: "Hey. Its Thurston Pounds with TTS is the pt able to be assessed. If so he will need to be placed in a private room. Can you fax [phone]) me the IVC paperwork."   Clinician awaiting response.    Redmond Pulling, MS, [facility], [facility] Triage Specialist [phone]

## 2021-01-02 ENCOUNTER — Other Ambulatory Visit: Payer: Self-pay

## 2021-01-02 MED ORDER — ZIPRASIDONE MESYLATE 20 MG IM SOLR: 20.0000 mg | Freq: Once | INTRAMUSCULAR | Status: DC

## 2021-01-02 MED ORDER — ZIPRASIDONE MESYLATE 20 MG IM SOLR: 20.0000 mg | Freq: Once | INTRAMUSCULAR | Status: DC | PRN

## 2021-01-02 MED ORDER — LIDOCAINE 5 % EX PTCH
1.0000 | MEDICATED_PATCH | CUTANEOUS | Status: DC
  Administered 2021-01-02 – 2021-01-03 (×2): 1 via TRANSDERMAL
  Filled 2021-01-02 (×3): qty 1

## 2021-01-02 NOTE — ED Notes (Signed)
Pt is agitated and talking rapidly in his room but is not showing signs of violence or aggression. No acute distress noted.

## 2021-01-02 NOTE — ED Provider Notes (Signed)
Emergency Medicine Observation Re-evaluation Note  Brett Salinas is a 50 y.o. male, seen on rounds today.  Pt initially presented to the ED for complaints of Psychiatric Evaluation Currently, the patient is sitting on the edge of his bed no distress.  Physical Exam  BP (!) 127/93   Pulse 86   Temp (!) 97.5 F (36.4 C) (Oral)   Resp (!) 24   SpO2 100%  Physical Exam General: Awake and alert no distress, gross paranoia palpable Cardiac: Regular rate and rhythm Lungs: No increased work of breathing Psych: Interacting with himself and other nonvisible entities  ED Course / MDM  EKG:EKG Interpretation  Date/Time:  Friday January 01 2021 18:17:02 EST Ventricular Rate:  82 PR Interval:  162 QRS Duration: 90 QT Interval:  396 QTC Calculation: 462 R Axis:   78 Text Interpretation: Normal sinus rhythm Right atrial enlargement Minimal voltage criteria for LVH, may be normal variant ( Sokolow-Lyon ) Borderline ECG No acute changes Confirmed by Derwood Kaplan [phone]) on 01/01/2021 11:19:36 PM  I have reviewed the labs performed to date as well as medications administered while in observation.  Recent changes in the last 24 hours include ongoing TTS evaluation.  Plan  Current plan is for placement/psychiatry involvement. Brett Salinas is under involuntary commitment.      Gerhard Munch, MD 01/02/21 1249

## 2021-01-02 NOTE — ED Notes (Signed)
Pt shaking and blinking eyes and making crying/groaning noises. Vital signs updated. RN notified.

## 2021-01-02 NOTE — ED Notes (Signed)
Patient at the desk speaking loudly to someone on the phone. Patient very paranoid, thinks he is not in the hospital. After phone call, patient goes to his room and speaks loudly to self.

## 2021-01-02 NOTE — ED Notes (Signed)
IVC papers were sent to the Magistrate with the correct name of Brett Salinas.  When the Magistrate returned the papers, the pt's name was spelled [name].  The Secretary, Sherril Croon, called the Magistrate and explained the error.  Magistrate Eduard Roux stated that it was ok to leave the name misspelled on the paperwork.

## 2021-01-02 NOTE — ED Notes (Signed)
Pt belongings placed in locker #5, no valuables with security.

## 2021-01-02 NOTE — ED Notes (Signed)
Attempted to wake pt again for TTS.  Pt responds, but will not wake up.

## 2021-01-02 NOTE — BH Assessment (Signed)
Comprehensive Clinical Assessment (CCA) Note  01/02/2021 Brett Salinas [facility] [id number]  Chief Complaint:  Chief Complaint  Patient presents with   Psychiatric Evaluation   Visit Diagnosis:   Per Chart: F20.9 Schizophrenia  Flowsheet Row ED from 01/01/2021 in [place] ED from 11/22/2020 in [place] ED from 07/24/2020 in [place] No Risk No Risk No Risk      The patient demonstrates the following risk factors for suicide: Chronic risk factors for suicide include: psychiatric disorder of schizophrenia, substance use disorder, and previous suicide attempts and ideations . Acute risk factors for suicide include: family or marital conflict and loss (financial, interpersonal, professional). Protective factors for this patient include: positive therapeutic relationship, responsibility to others (children, family), coping skills, and life satisfaction. Considering these factors, the overall suicide risk at this point appears to be no risk. Patient is not appropriate for outpatient follow up.  Disposition: Merlyn Lot NP, patient meets inpatient criteria; also, restart medication. TOC consult for Social work to evaluate home environment and Aeronautical engineer.  Disposition   discussed with Brett Salinas, via secure chat in [place].  RN to discuss disposition with EDP.    Jarious Betanzos is a 50 years old patient who presents to [facility] via [employer] and IVC.   Pt IVC reads "Mr. Logeman is noted to talking to himself.  He was committed by [facility], where he was brought by Police after he was found screaming by neighbors".  Pt gave TTS permission to contact Haskell Riling, no phone number available.  Pt denied SI and HI.  Pt appears psychotic with rambling pressured speech. Pt makes several statements, "ghost in the system, pretenders and assassinations",  When asked if  he is hearing or seeing things, "I put air pieces in my ears and nose". Pt cover his head with a sheet during moments of the assessment. Pt acknowledged that he was tired; also Probation officer observed anxious, aggressive, suspiciousness , confusion, and imaginary playmates.  Pt reports, "they keep doing things to me".  Pt unable to discuss sleep pattern or current appetite.  Pt denied using alcohol or using any other substance used.    Pt identified his primary stressor as, "I am worried about my wife,(Brett Salinas) she is 51 years old, I want her to be okay".  Pt unable to discuss his living arrangements; however, did  report Haskell Riling as a supportive person, "please contact him and tell him to come and get me".  Pt unable to reports information about family history about mental health or substance used.  Pt unable to speak about abuse or trauma.  Pt unable to speak about guns or weapons in his home.  Pt unable to speak about outpatient therapy; also, did not speak on medication management.  Pt dressed in scrubs, alert,oriented x 3 with articulation error  and garbled speech.  Pt restless motor behavior.  Eye contact is fleeting.  Pt mood is aggressive and agitated. Pt affect is anxious.  Thought process is delusional and illusions.  Pt insight is poor and judgment is poor.  There is indication, Pt is currently responding to internal stimuli or experiencing delusional thought contently.  Pt was guarded and suspicious throughout assessment.    CCA Screening, Triage and Referral (STR)  Patient Reported Information How did you hear about [place]? Legal System  What Is the Reason for Your Visit/Call Today? Under IVC  How Long Has This Been Causing You Problems? 1 wk - 1 month  What Do You Feel Would Help You the Most Today? Treatment for Depression or other mood problem   Have You Recently Had Any Thoughts About Hurting Yourself? -- (UTA)  Are You Planning to Commit Suicide/Harm Yourself At This time? --  (UTA)   Have you Recently Had Thoughts About [place]? -- (UTA)  Are You Planning to Harm Someone at This Time? -- (UTA)  Explanation: No data recorded  Have You Used Any Alcohol or Drugs in the Past 24 Hours? -- (UTA)  How Long Ago Did You Use Drugs or Alcohol? No data recorded What Did You Use and How Much? UTA   Do You Currently Have a Therapist/Psychiatrist? No  Name of Therapist/Psychiatrist: No data recorded  Have You Been Recently Discharged From Any Office Practice or Programs? No  Explanation of Discharge From Practice/Program: No data recorded    CCA Screening Triage Referral Assessment Type of Contact: Tele-Assessment  Telemedicine Service Delivery:   Is this Initial or Reassessment? Initial Assessment  Date Telepsych consult ordered in [facility]:  09/14/20  Time Telepsych consult ordered in [facility]:  0410  Location of Assessment: [facility]  Provider Location: [facility]   Collateral Involvement: none--pt states that he has no family closeby and that his girlfriend just recently left him   Does Patient Have a Perley? No data recorded Name and Contact of Legal Guardian: No data recorded If Minor and Not Living with Parent(s), Who has Custody? No data recorded Is CPS involved or ever been involved? Never  Is APS involved or ever been involved? Never   Patient Determined To Be At Risk for Harm To Self or Others Based on Review of Patient Reported Information or Presenting Complaint? No  Method: No data recorded Availability of Means: No data recorded Intent: No data recorded Notification Required: No data recorded Additional Information for Danger to Others Potential: No data recorded Additional Comments for Danger to Others Potential: No data recorded Are There Guns or Other Weapons in Your Home? No data recorded Types of Guns/Weapons: No data recorded Are These Weapons Safely Secured?                             No data recorded Who Could Verify You Are Able To Have These Secured: No data recorded Do You Have any Outstanding Charges, Pending Court Dates, Parole/Probation? No data recorded Contacted To Inform of Risk of Harm To Self or Others: No data recorded   Does Patient Present under Involuntary Commitment? No  IVC Papers Initial File Date: 05/30/20   [place] of Residence: [place]   Patient Currently Receiving the Following Services: -- (Patient has no plans in place at this time.)   Determination of Need: Emergent (2 hours)   Options For Referral: Medication Management; Outpatient Therapy; Inpatient Hospitalization     CCA Biopsychosocial Patient Reported Schizophrenia/Schizoaffective Diagnosis in Past: Yes   Strengths: UTA   Mental Health Symptoms Depression:   Irritability   Duration of Depressive symptoms:    Mania:   Irritability; Racing thoughts; Recklessness; Increased Energy   Anxiety:    None   Psychosis:   Grossly disorganized speech; Hallucinations   Duration of Psychotic symptoms:  Duration of Psychotic Symptoms: Less than six months   Trauma:   None   Obsessions:   None   Compulsions:   "Driven"  to perform behaviors/acts; Disrupts with routine/functioning   Inattention:   Does not seem to listen; Poor follow-through on tasks   Hyperactivity/Impulsivity:   None   Oppositional/Defiant Behaviors:   None   Emotional Irregularity:   Intense/unstable relationships; Transient, stress-related paranoia/disassociation   Other Mood/Personality Symptoms:   UTA    Mental Status Exam Appearance and self-care  Stature:   Average   Weight:   Average weight (Pt dressed in scrubs.)   Clothing:   -- (Pt dressed in scrubs.)   Grooming:   Normal   Cosmetic use:   None   Posture/gait:   Normal   Motor activity:   Agitated; Not Remarkable; Repetitive; Tremor; Restless   Sensorium  Attention:   Distractible   Concentration:    Scattered; Focuses on irrelevancies   Orientation:   Object; Person; Place   Recall/memory:   Defective in Short-term   Affect and Mood  Affect:   Anxious; Not Congruent   Mood:   Hypomania; Dysphoric; Anxious; Angry   Relating  Eye contact:   Fleeting   Facial expression:   Sad; Anxious; Angry   Attitude toward examiner:   Defensive; Guarded; Irritable; Resistant; Argumentative   Thought and Language  Speech flow:  Flight of Ideas; Articulation error   Thought content:   Delusions; Illusions; Suspicious   Preoccupation:   None   Hallucinations:   Auditory; Visual   Organization:  No data recorded  [employer] of Knowledge:   Poor   Intelligence:   Needs investigation   Abstraction:   Overly abstract   Judgement:   Poor   Reality Testing:   Unaware; Distorted   Insight:   Poor   Decision Making:   Impulsive; Confused   Social Functioning  Social Maturity:   Impulsive   Social Judgement:   Impropriety   Stress  Stressors:   Relationship   Coping Ability:   Overwhelmed; Deficient supports   Skill Deficits:   Decision making; Self-care; Interpersonal   Supports:   Friends/Service system     Religion: Religion/Spirituality How Might This Affect Treatment?: UTA  Leisure/Recreation: Leisure / Recreation Do You Have Hobbies?:  (UTA)  Exercise/Diet: Exercise/Diet Do You Exercise?:  (UTA) Have You Gained or Lost A Significant Amount of Weight in the Past Six Months?: No Do You Follow a Special Diet?:  (UTA) Do You Have Any Trouble Sleeping?: Yes Explanation of Sleeping Difficulties: UTA   CCA Employment/Education Employment/Work Situation: Employment / Work Situation Employment Situation: Unemployed Patient's Job has Been Impacted by Current Illness: No Has Patient ever Been in Equities trader?: No  Education: Education Is Patient Currently Attending School?: No Last Grade Completed:  (UTA) Did You  Attend College?: No Did You Have An Individualized Education Program (IIEP): No Did You Have Any Difficulty At School?: No Patient's Education Has Been Impacted by Current Illness:  (UTA)   CCA Family/Childhood History Family and Relationship History: Family history Does patient have children?:  (unknown)  Childhood History:  Childhood History By whom was/is the patient raised?: Other (Comment) (unknown) Did patient suffer any verbal/emotional/physical/sexual abuse as a child?: No Did patient suffer from severe childhood neglect?:  (UTA) Has patient ever been sexually abused/assaulted/raped as an adolescent or adult?: No Was the patient ever a victim of a crime or a disaster?:  (UTA) Witnessed domestic violence?: No Has patient been affected by domestic violence as an adult?: No  Child/Adolescent Assessment:     CCA Substance Use Alcohol/Drug Use: Alcohol / Drug  Use Pain Medications: SEE MAR Prescriptions: SEE MAR Over the Counter: SEE MAR History of alcohol / drug use?: Yes Longest period of sobriety (when/how long): UNKNOWN Withdrawal Symptoms: Agitation, Aggressive/Assaultive                         ASAM's:  Six Dimensions of Multidimensional Assessment  Dimension 1:  Acute Intoxication and/or Withdrawal Potential:   Dimension 1:  Description of individual's past and current experiences of substance use and withdrawal: etoh, cocaine, methamphetamine,  Dimension 2:  Biomedical Conditions and Complications:      Dimension 3:  Emotional, Behavioral, or Cognitive Conditions and Complications:     Dimension 4:  Readiness to Change:     Dimension 5:  Relapse, Continued use, or Continued Problem Potential:     Dimension 6:  Recovery/Living Environment:     ASAM Severity Score: ASAM's Severity Rating Score: 12  ASAM Recommended Level of Treatment:     Substance use Disorder (SUD) Substance Use Disorder (SUD)  Checklist Symptoms of Substance Use: Continued use  despite having a persistent/recurrent physical/psychological problem caused/exacerbated by use, Continued use despite persistent or recurrent social, interpersonal problems, caused or exacerbated by use, Evidence of withdrawal (Comment), Evidence of tolerance, Presence of craving or strong urge to use, Large amounts of time spent to obtain, use or recover from the substance(s), Recurrent use that results in a failure to fulfill major role obligations (work, school, home), Persistent desire or unsuccessful efforts to cut down or control use, Repeated use in physically hazardous situations, Social, occupational, recreational activities given up or reduced due to use, Substance(s) often taken in larger amounts or over longer times than was intended  Recommendations for Services/Supports/Treatments: Recommendations for Services/Supports/Treatments Recommendations For Services/Supports/Treatments: Medication Management, Peer Support, Health and safety inspectorACCTT (Assertive Community Treatment), Individual Therapy, Peer Support Services  Discharge Disposition:    DSM5 Diagnoses: Patient Active Problem List   Diagnosis Date Noted   Substance induced mood disorder (HCC) 11/09/2020   Acute on chronic diastolic CHF (congestive heart failure), NYHA class 3 (HCC) 02/11/2020   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/02/2019   Homelessness 07/02/2019   Hallucinations    Polysubstance abuse (HCC)    AKI (acute kidney injury) (HCC) 09/25/2018   Tobacco use disorder 12/29/2016   COPD (chronic obstructive pulmonary disease) (HCC) 12/28/2016   Dyslipidemia 12/28/2016   Amphetamine use disorder, 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   Drug abuse (HCC)    Agitation 12/24/2016   Aggression 12/24/2016   Altered mental status 12/23/2016   History of ED visit for Medical clearance for incarceration for alleged aggressive behavior and crack cocaine use 09/05/2016    Osteoarthritis 07/20/2016   Essential hypertension 11/30/2015   CAD -S/P PCI LAD/DES 10/13/15 10/14/2015   Wound infection- for I&D achilles wound 10/14/15 10/14/2015   Cocaine abuse-drug sceen positive, History of 10/14/2015   Schizoaffective disorder, bipolar type (HCC) 10/14/2015   Infection 10/14/2015   NSTEMI (non-ST elevated myocardial infarction) [facility](HCC)    Chest pain 10/12/2015   Achilles tendon rupture-surgery 09/19/15 09/19/2015     Referrals to Alternative Service(s): Referred to Alternative Service(s):   Place:   Date:   Time:    Referred to Alternative Service(s):   Place:   Date:   Time:    Referred to Alternative Service(s):   Place:   Date:   Time:    Referred to Alternative Service(s):   Place:   Date:  Time:     Leonides Schanz, Counselor

## 2021-01-02 NOTE — ED Notes (Signed)
Pt having TTS Consult.

## 2021-01-02 NOTE — ED Notes (Signed)
Patient is very argumentative , refuses TTS. [name] at bedside.

## 2021-01-03 LAB — RAPID URINE DRUG SCREEN, HOSP PERFORMED
Amphetamines: POSITIVE — AB
Barbiturates: NOT DETECTED
Benzodiazepines: NOT DETECTED
Cocaine: POSITIVE — AB
Opiates: NOT DETECTED
Tetrahydrocannabinol: NOT DETECTED

## 2021-01-03 MED ORDER — OLANZAPINE 5 MG PO TABS
5.0000 mg | ORAL_TABLET | Freq: Every day | ORAL | Status: DC
  Administered 2021-01-03: 13:00:00 5 mg via ORAL
  Filled 2021-01-03 (×2): qty 1

## 2021-01-03 MED ORDER — GABAPENTIN 100 MG PO CAPS
200.0000 mg | ORAL_CAPSULE | Freq: Three times a day (TID) | ORAL | Status: DC
  Administered 2021-01-03: 13:00:00 200 mg via ORAL
  Filled 2021-01-03 (×3): qty 2

## 2021-01-03 MED ORDER — ACETAMINOPHEN 325 MG PO TABS
650.0000 mg | ORAL_TABLET | Freq: Once | ORAL | Status: AC
  Administered 2021-01-03: 08:00:00 650 mg via ORAL
  Filled 2021-01-03: qty 2

## 2021-01-03 NOTE — Progress Notes (Signed)
Per Gaynelle Adu, Admissions, pt has been accepted to [facility] C-unit. Accepting provider is Dr. Robet Leu . Patient can arrive 01/04/2021 after 9:00am. Number for report is [phone]. Please fax IVC paperwork before calling report to fax#: [phone].    Crissie Reese, MSW, LCSW-A Phone: [phone] Disposition/TOC

## 2021-01-03 NOTE — ED Notes (Signed)
Pt ambulatory to restroom without difficulty. Pt voided and returned to room without incident. Safety sitter remains outside room with door opened for monitoring. No needs identified at this time.

## 2021-01-03 NOTE — ED Notes (Signed)
Pt remains intermittently agitated but is still cooperative. He requested a snack and was given food and drink. Pt is in NAD with safety sitter outside his room.

## 2021-01-03 NOTE — ED Notes (Signed)
Per Gaynelle Adu, Admissions, pt has been accepted to [facility] C-unit. Accepting provider is Dr. Robet Leu . Patient can arrive 01/04/2021 after 9:00am. Number for report is [phone]. Please fax IVC paperwork before calling report to fax#: [phone].

## 2021-01-03 NOTE — Progress Notes (Signed)
Per Shnese Mills,NP , patient meets criteria for inpatient treatment. There are no available or appropriate beds at [facility] today. CSW faxed referrals to the following facilities for review:  [facility]  Pending - No Request Sent N/A [address] [place] [address] [phone] --  [facility]  Pending - No Request Sent N/A [address] [place] [address] [phone] --  [facility]  Pending - No Request Sent N/A [address] [place] [address] [phone] --  [facility]  Pending - No Request Sent N/A [address], [place] [address] [phone] --  [facility]  Pending - No Request Sent N/A [address], [place] [address] [phone] --  [facility]  Pending - No Request Sent N/A [address] [place] [address] [phone] --  [facility]  Pending - No Request Sent N/A [address] [place] [address] [phone] --  [facility]  Pending - No Request Sent N/A [address] [place] [address] [phone] --  [facility]  Pending - No Request Sent N/A [address] [place] [address] [phone] --  [facility]  Pending - No Request Sent N/A [address], [place] [address] [phone] --  [facility]  Pending - No Request Sent N/A [address], [place] [address] [phone] --  [facility]  Pending - No Request Sent N/A [address] [place] [address] [phone] --  [facility]  Pending - No Request Sent N/A [address] [place] [address] [phone] --   [facility]  Pending - No Request Sent N/A [address], [place] [address] [phone] --  [facility]  Pending - No Request Sent N/A [address], [place] [address] [phone] --    TTS will continue to seek bed placement.  Darlyn Repsher, MSW, LCSW-A, LCAS-A Phone: [phone] Disposition/TOC  

## 2021-01-03 NOTE — ED Notes (Signed)
Pt eating dinner tray at this time 

## 2021-01-03 NOTE — ED Notes (Signed)
IVC paperwork faxed to [facility]

## 2021-01-03 NOTE — ED Notes (Signed)
Pt states he has pain all over. Pt states I never had pain till yall forced me in here and now all I am doing is hurting.

## 2021-01-04 NOTE — ED Notes (Signed)
Breakfast orders placed 

## 2021-01-04 NOTE — ED Provider Notes (Signed)
  Physical Exam  BP (!) 135/95 (BP Location: Left Arm)   Pulse 68   Temp 98.2 F (36.8 C) (Oral)   Resp 20   SpO2 99%   Physical Exam  ED Course/Procedures     Procedures  MDM  It appears patient will be transferred to old [place] today.       Benjiman Core, MD 01/04/21 [phone]

## 2021-01-04 NOTE — ED Notes (Addendum)
Report called to Lemar Livings, RN at [facility].

## 2021-01-04 NOTE — ED Notes (Signed)
Pt has been in his room this AM, was given breakfast and has been remaining to himself. No verbal or physical threats. He did refuse his meds because they make him feel off

## 2021-01-04 NOTE — ED Notes (Signed)
Sheriff contacted for transport to [facility]

## 2021-02-18 ENCOUNTER — Emergency Department (HOSPITAL_COMMUNITY)
Admission: EM | Admit: 2021-02-18 | Discharge: 2021-02-19 | Disposition: A | Discharge status: Home / Self Care | Payer: Self-pay | Attending: Emergency Medicine | Admitting: Emergency Medicine

## 2021-02-18 ENCOUNTER — Emergency Department (HOSPITAL_COMMUNITY): Payer: Self-pay

## 2021-02-18 DIAGNOSIS — Z20822 Contact with and (suspected) exposure to covid-19: Secondary | ICD-10-CM | POA: Insufficient documentation

## 2021-02-18 DIAGNOSIS — I10 Essential (primary) hypertension: Secondary | ICD-10-CM

## 2021-02-18 DIAGNOSIS — F1994 Other psychoactive substance use, unspecified with psychoactive substance-induced mood disorder: Secondary | ICD-10-CM

## 2021-02-18 DIAGNOSIS — R451 Restlessness and agitation: Secondary | ICD-10-CM | POA: Insufficient documentation

## 2021-02-18 DIAGNOSIS — R462 Strange and inexplicable behavior: Secondary | ICD-10-CM | POA: Insufficient documentation

## 2021-02-18 DIAGNOSIS — R410 Disorientation, unspecified: Secondary | ICD-10-CM | POA: Insufficient documentation

## 2021-02-18 DIAGNOSIS — F1410 Cocaine abuse, uncomplicated: Secondary | ICD-10-CM

## 2021-02-18 DIAGNOSIS — R5383 Other fatigue: Secondary | ICD-10-CM | POA: Insufficient documentation

## 2021-02-18 DIAGNOSIS — Z79899 Other long term (current) drug therapy: Secondary | ICD-10-CM | POA: Insufficient documentation

## 2021-02-18 DIAGNOSIS — F1910 Other psychoactive substance abuse, uncomplicated: Secondary | ICD-10-CM

## 2021-02-18 DIAGNOSIS — F1510 Other stimulant abuse, uncomplicated: Secondary | ICD-10-CM

## 2021-02-18 LAB — COMPREHENSIVE METABOLIC PANEL
ALT: 15 U/L (ref 0–44)
AST: 34 U/L (ref 15–41)
Albumin: 3.9 g/dL (ref 3.5–5.0)
Alkaline Phosphatase: 76 U/L (ref 38–126)
Anion gap: 8 (ref 5–15)
BUN: 28 mg/dL — ABNORMAL HIGH (ref 6–20)
CO2: 26 mmol/L (ref 22–32)
Calcium: 8.6 mg/dL — ABNORMAL LOW (ref 8.9–10.3)
Chloride: 105 mmol/L (ref 98–111)
Creatinine, Ser: 1.23 mg/dL (ref 0.61–1.24)
GFR, Estimated: >60 mL/min (ref >60)
Glucose, Bld: 101 mg/dL — ABNORMAL HIGH (ref 70–99)
Potassium: 4 mmol/L (ref 3.5–5.1)
Sodium: 139 mmol/L (ref 135–145)
Total Bilirubin: 0.7 mg/dL (ref 0.3–1.2)
Total Protein: 7.3 g/dL (ref 6.5–8.1)

## 2021-02-18 LAB — CBC WITH DIFFERENTIAL/PLATELET
Abs Immature Granulocytes: 0.03 10*3/uL (ref 0.00–0.07)
Basophils Absolute: 0 10*3/uL (ref 0.0–0.1)
Basophils Relative: 0 %
Eosinophils Absolute: 0.1 10*3/uL (ref 0.0–0.5)
Eosinophils Relative: 1 %
HCT: 39.2 % (ref 39.0–52.0)
Hemoglobin: 13.4 g/dL (ref 13.0–17.0)
Immature Granulocytes: 0 %
Lymphocytes Relative: 12 %
Lymphs Abs: 1.3 10*3/uL (ref 0.7–4.0)
MCH: 30.3 pg (ref 26.0–34.0)
MCHC: 34.2 g/dL (ref 30.0–36.0)
MCV: 88.7 fL (ref 80.0–100.0)
Monocytes Absolute: 0.6 10*3/uL (ref 0.1–1.0)
Monocytes Relative: 6 %
Neutro Abs: 8.7 10*3/uL — ABNORMAL HIGH (ref 1.7–7.7)
Neutrophils Relative %: 81 %
Platelets: 205 10*3/uL (ref 150–400)
RBC: 4.42 MIL/uL (ref 4.22–5.81)
RDW: 12.7 % (ref 11.5–15.5)
WBC: 10.8 10*3/uL — ABNORMAL HIGH (ref 4.0–10.5)
nRBC: 0 % (ref 0.0–0.2)

## 2021-02-18 LAB — RAPID URINE DRUG SCREEN, HOSP PERFORMED
Amphetamines: POSITIVE — AB
Barbiturates: NOT DETECTED
Benzodiazepines: POSITIVE — AB
Cocaine: POSITIVE — AB
Opiates: NOT DETECTED
Tetrahydrocannabinol: NOT DETECTED

## 2021-02-18 LAB — RESP PANEL BY RT-PCR (FLU A&B, COVID) ARPGX2
Influenza A by PCR: NEGATIVE
Influenza B by PCR: NEGATIVE
SARS Coronavirus 2 by RT PCR: NEGATIVE

## 2021-02-18 LAB — CBG MONITORING, ED: Glucose-Capillary: 87 mg/dL (ref 70–99)

## 2021-02-18 LAB — ETHANOL: Alcohol, Ethyl (B): <10 mg/dL (ref <10)

## 2021-02-18 LAB — CK: Total CK: 691 U/L — ABNORMAL HIGH (ref 49–397)

## 2021-02-18 MED ORDER — LACTATED RINGERS IV SOLN: INTRAVENOUS | Status: DC

## 2021-02-18 MED ORDER — LACTATED RINGERS IV BOLUS
1000.0000 mL | Freq: Once | INTRAVENOUS | Status: AC
  Administered 2021-02-18: 1000 mL via INTRAVENOUS

## 2021-02-18 MED ORDER — LORAZEPAM 2 MG/ML IJ SOLN
2.0000 mg | Freq: Once | INTRAMUSCULAR | Status: AC
Start: 2021-02-18 — End: 2021-02-18
  Administered 2021-02-18: 2 mg via INTRAVENOUS
  Filled 2021-02-18: qty 1

## 2021-02-18 NOTE — ED Notes (Signed)
Back from CT

## 2021-02-18 NOTE — ED Notes (Signed)
Pt. [facility] 87, RN, Ronny Bacon made aware.

## 2021-02-18 NOTE — ED Triage Notes (Signed)
Patient brought in by EMS after being fond in [facility]. Patient was found with meth. Patient is aggressive, yelling, and screaming at staff and EMS. Patient arrived in [facility]. Pateint was given 5mg  of versed IM

## 2021-02-18 NOTE — ED Notes (Signed)
To Ct 

## 2021-02-18 NOTE — ED Provider Notes (Signed)
[place] DEPT Provider Note   CSN: [id number] Arrival date & time: 02/18/21  2008     History  Chief Complaint  Patient presents with   Drug Overdose    Brett Salinas is a 51 y.o. male.  51 year old male presents via EMS after being found with bizarre behavior.  Methamphetamine paraphernalia noted.  Patient required sedation with Versed after he became combative.  No history obtainable due to his current state      Home Medications Prior to Admission medications   Medication Sig Start Date End Date Taking? Authorizing Provider  ARIPiprazole ER (ABILIFY MAINTENA) 400 MG SRER injection Inject 2 mLs (400 mg total) into the muscle every 28 (twenty-eight) days. Due 5/1 Patient not taking: Reported on 02/11/2020 05/06/19   Johnn Hai, MD  atorvastatin (LIPITOR) 40 MG tablet Take 1 tablet (40 mg total) by mouth daily. Patient not taking: Reported on 05/02/2019 03/11/19   Dorothy Spark, MD  ciclopirox [facility]) 8 % solution Apply topically at bedtime. Apply over nail and surrounding skin. Apply daily over previous coat. After seven (7) days, may remove with alcohol and continue cycle. 11/22/20   Noemi Chapel, MD  gabapentin (NEURONTIN) 300 MG capsule Take 300 mg by mouth 3 (three) times daily.    [provider]  losartan (COZAAR) 50 MG tablet Take 50 mg by mouth at bedtime.    [provider]  meloxicam (MOBIC) 15 MG tablet Take 15 mg by mouth daily.    [provider]  mupirocin cream (BACTROBAN) 2 % Apply 1 application topically 2 (two) times daily. 11/22/20   Noemi Chapel, MD  OLANZapine (ZYPREXA) 15 MG tablet Take 15 mg by mouth at bedtime.    [provider]      Allergies    Catfish [fish allergy], Fish allergy, Heparin, Pork-derived products, Heparin, and Pork-derived products    Review of Systems   Review of Systems  Unable to perform ROS: Psychiatric disorder   Physical Exam Updated Vital  Signs There were no vitals taken for this visit. Physical Exam Vitals and nursing note reviewed.  Constitutional:      General: He is not in acute distress.    Appearance: Normal appearance. He is well-developed. He is not toxic-appearing.  HENT:     Head: Normocephalic and atraumatic.  Eyes:     General: Lids are normal.     Conjunctiva/sclera: Conjunctivae normal.     Pupils: Pupils are equal, round, and reactive to light.  Neck:     Thyroid: No thyroid mass.     Trachea: No tracheal deviation.  Cardiovascular:     Rate and Rhythm: Normal rate and regular rhythm.     Heart sounds: Normal heart sounds. No murmur heard.   No gallop.  Pulmonary:     Effort: Pulmonary effort is normal. No respiratory distress.     Breath sounds: Normal breath sounds. No stridor. No decreased breath sounds, wheezing, rhonchi or rales.  Abdominal:     General: There is no distension.     Palpations: Abdomen is soft.     Tenderness: There is no abdominal tenderness. There is no rebound.  Musculoskeletal:        General: No tenderness. Normal range of motion.     Cervical back: Normal range of motion and neck supple.  Skin:    General: Skin is warm and dry.     Findings: No abrasion or rash.  Neurological:     Mental  Status: He is lethargic, disoriented and confused.     GCS: GCS eye subscore is 2. GCS verbal subscore is 3. GCS motor subscore is 4.     Cranial Nerves: No cranial nerve deficit.     Sensory: No sensory deficit.     Comments: Patient uncooperative with exam  Psychiatric:        Attention and Perception: He is inattentive.        Speech: Speech is rapid and pressured.        Behavior: Behavior is agitated.    ED Results / Procedures / Treatments   Labs (all labs ordered are listed, but only abnormal results are displayed) Labs Reviewed  RESP PANEL BY RT-PCR (FLU A&B, COVID) ARPGX2  ETHANOL  RAPID URINE DRUG SCREEN, HOSP PERFORMED  CBC WITH DIFFERENTIAL/PLATELET   COMPREHENSIVE METABOLIC PANEL  CK  CBG MONITORING, ED    EKG EKG Interpretation  Date/Time:  Thursday February 18 2021 20:26:45 EST Ventricular Rate:  105 PR Interval:  147 QRS Duration: 83 QT Interval:  340 QTC Calculation: 450 R Axis:   78 Text Interpretation: Sinus tachycardia Biatrial enlargement Probable left ventricular hypertrophy ST elev, probable normal early repol pattern No significant change since last tracing Confirmed by Lacretia Leigh ([id number]) on 02/18/2021 9:28:47 PM  Radiology No results found.  Procedures Procedures    Medications Ordered in ED Medications  lactated ringers bolus 1,000 mL (has no administration in time range)  lactated ringers infusion (has no administration in time range)  LORazepam (ATIVAN) injection 2 mg (has no administration in time range)    ED Course/ Medical Decision Making/ A&P                           Medical Decision Making Amount and/or Complexity of Data Reviewed Labs: ordered.  Risk Prescription drug management.   Patient medicated with Ativan due to severe agitation.  He is now resting comfortably here.  He is able to maintain his airway.  UDS positive for cocaine, amphetamines.  Call levels negative.  K mildly elevated at 691 otherwise I do not think he has rhabdomyolysis.  Will order head CT and patient will require continued observation.  We will sign off next provider        Final Clinical Impression(s) / ED Diagnoses Final diagnoses:  None    Rx / DC Orders ED Discharge Orders     None         Lacretia Leigh, MD 02/18/21 2221

## 2021-02-19 MED ORDER — HALOPERIDOL LACTATE 5 MG/ML IJ SOLN
5.0000 mg | Freq: Once | INTRAMUSCULAR | Status: AC
  Administered 2021-02-19: 5 mg via INTRAMUSCULAR
  Filled 2021-02-19: qty 1

## 2021-02-19 MED ORDER — DIPHENHYDRAMINE HCL 50 MG/ML IJ SOLN
25.0000 mg | Freq: Once | INTRAMUSCULAR | Status: AC
  Administered 2021-02-19: 25 mg via INTRAMUSCULAR
  Filled 2021-02-19: qty 1

## 2021-02-19 NOTE — ED Provider Notes (Signed)
Patient signed out this AM by Dr Nicanor Alcon that patient metabolizing substances on board, and plan is for d/c when more alert.  Pt was observed in ED for several hours.  Pt currently is awake and alert. Pt has been provided food/drink, and has ambulated in hall.   No current complaints. No pain. No headache. No chest pain. No sob. Normal mood and affect. Denies thoughts of harm to self or others.   Pt currently appears stable for d/c.    Rec cessation substance use, pt is provided resource guide for treatment programs, and other social services.   Also rec close pcp f/u, including for blood pressure follow up.   Pt currently appears stable for d/c.  Return precautions provided.      Cathren Laine, MD 02/19/21 [phone]

## 2021-02-19 NOTE — Discharge Instructions (Addendum)
It was our pleasure to provide your ER care today - we hope that you feel better.  Drink plenty of fluids/stay well hydrated.   Avoid cocaine and/or methamphetamine use as they and similar substances adversely affect your physical and mental health and well-being.  In addition, they can cause heart attacks and strokes.  See resource guides provide for outpatient treatment options, as well as other social services.   For mental health issues and/or crisis, you may go directly [facility] - it is open 24/7 and walk-ins are welcome.   Also follow up closely with primary care doctor in the next 1-2 weeks - have your blood pressure rechecked then as it is high today. Make sure to take your blood pressure meds as prescribed, limit salt intake, and follow heart healthy eating plan.   Return to ER if worse, new symptoms, fevers, new/severe pain, chest pain, trouble breathing, or other concern.

## 2021-02-19 NOTE — ED Notes (Signed)
Patient given water and breakfast tray. Assisted to sit patient in bed with tray on bedside.

## 2021-02-19 NOTE — ED Notes (Signed)
Patient out of bed urinating on floor. Patient assisted back to bed. Bed alarm and nonskid socks on.

## 2021-02-19 NOTE — ED Notes (Signed)
Security called to bedside as patient is refusing to leave after reviewing discharge instructions.

## 2021-02-19 NOTE — ED Provider Notes (Addendum)
Awoke and was still intoxicated and attempted to rub up on the leg of staff.  Was flailing and repetitive.  Given medication and now resting.  Will sign out to am team     NCAT EOMI RRR CTAV NABS, soft  7 am, patient is still sleeping soundly post medication.  Will need to awake from medications and PO challenge and walk    Orange Hilligoss, MD 02/19/21 [id number]

## 2021-02-19 NOTE — ED Notes (Signed)
Patient falling asleep while eating. Noted to have eaten grits and some of english muffin.

## 2021-02-19 NOTE — ED Notes (Signed)
Patient resting with eyes closed. Equal chest rise and fall noted. No restraints applied at this time.

## 2021-02-19 NOTE — ED Notes (Signed)
Patient given turkey sandwich

## 2021-02-19 NOTE — ED Notes (Signed)
Attempted to wake patient to offer fluids. Patient remains drowsy. Will continue to attempt to offer.

## 2021-02-19 NOTE — ED Notes (Signed)
Attempted to walk patient and provide something to drink Patient became combative and aggressive, began using sexually explicit body language towards staff. Patient was also incontinent of urine. Sheets were changed and patient was placed in paper scrubs. Patient is back in bed.
